# Patient Record
Sex: Female | Born: 1985 | Race: Black or African American | Hispanic: No | Marital: Single | Smoking: Former smoker
Health system: Southern US, Community
[De-identification: ages and names within clinical notes are randomized; demographics above are authoritative.]

## PROBLEM LIST (undated history)

## (undated) ENCOUNTER — Inpatient Hospital Stay (HOSPITAL_COMMUNITY): Payer: Self-pay

## (undated) DIAGNOSIS — F259 Schizoaffective disorder, unspecified: Secondary | ICD-10-CM

## (undated) DIAGNOSIS — F53 Postpartum depression: Secondary | ICD-10-CM

## (undated) DIAGNOSIS — O139 Gestational [pregnancy-induced] hypertension without significant proteinuria, unspecified trimester: Secondary | ICD-10-CM

## (undated) DIAGNOSIS — I10 Essential (primary) hypertension: Secondary | ICD-10-CM

## (undated) DIAGNOSIS — D649 Anemia, unspecified: Secondary | ICD-10-CM

## (undated) HISTORY — DX: Schizoaffective disorder, unspecified: F25.9

## (undated) HISTORY — PX: NO PAST SURGERIES: SHX2092

---

## 2014-06-15 LAB — OB RESULTS CONSOLE RUBELLA ANTIBODY, IGM: Rubella: IMMUNE

## 2014-06-15 LAB — OB RESULTS CONSOLE HEPATITIS B SURFACE ANTIGEN: Hepatitis B Surface Ag: NEGATIVE

## 2014-06-15 LAB — OB RESULTS CONSOLE RPR: RPR: NONREACTIVE

## 2014-06-15 LAB — OB RESULTS CONSOLE VARICELLA ZOSTER ANTIBODY, IGG: VARICELLA IGG: IMMUNE

## 2014-06-15 LAB — OB RESULTS CONSOLE ABO/RH: RH TYPE: POSITIVE

## 2014-06-15 LAB — OB RESULTS CONSOLE GC/CHLAMYDIA
Chlamydia: NEGATIVE
Gonorrhea: NEGATIVE

## 2014-06-15 LAB — OB RESULTS CONSOLE ANTIBODY SCREEN: ANTIBODY SCREEN: NEGATIVE

## 2014-06-15 LAB — OB RESULTS CONSOLE HIV ANTIBODY (ROUTINE TESTING): HIV: NONREACTIVE

## 2014-07-02 LAB — OB RESULTS CONSOLE PLATELET COUNT: Platelets: 206 10*3/uL

## 2014-07-02 LAB — OB RESULTS CONSOLE HGB/HCT, BLOOD
HCT: 35 %
Hemoglobin: 11.7 g/dL

## 2014-09-17 NOTE — L&D Delivery Note (Cosign Needed)
Patient is 29 y.o. G1P0 1620w2d admitted for IOL 2/2 severe preeclampsia on mag x >24h   Delivery Note At 9:16 AM a viable female was delivered via Vaginal, Spontaneous Delivery (Presentation: Right Occiput Anterior).  APGAR: 8, 9; weight  .   Placenta status: Intact, Spontaneous.  Cord: 3 vessels with the following complications: none  Anesthesia: Epidural  Episiotomy: None Lacerations: 1st degree;periurethral Suture Repair: 3.0 vicryl rapide Est. Blood Loss (mL): 720mL  PPH: initially with increased bleeding, large clot evacuated from uterus.  Given 1000mcg cytotec PR.  Still boggy uterus, given hemabate IM.  Bimanual massage x 3 minutes with improved uterine tone.  Cervix inspected and no lacerations.  Subsequent small periurethral tear noted and repaired with figure of eight after foley placed. Mom to AICU for mag x 24h.  Baby to Couplet care / Skin to Skin.  Alyssa Mathis 01/11/2015, 10:37 AM  Addendum: Strongly suspect chorioamnionitis, very strong odor after delivery very characteristic of chorioamnionitis, pediatricians notified as this was an addendum to delivery summary.  Mother afebrile, no fetal tachycardia however mother was tachycardia during entirety of labor.  Discussed with Dr. West Carbohandler  Alyssa Vejar ROCIO, MD 4:02 PM

## 2014-10-25 LAB — GLUCOSE TOLERANCE, 1 HOUR (50G) W/O FASTING: GLUCOSE 1 HOUR GTT: 52 mg/dL (ref ?–200)

## 2014-10-25 LAB — OB RESULTS CONSOLE HGB/HCT, BLOOD: HEMOGLOBIN: 9.5 g/dL

## 2014-10-25 LAB — OB RESULTS CONSOLE GC/CHLAMYDIA
Chlamydia: NEGATIVE
Gonorrhea: NEGATIVE

## 2014-10-25 LAB — OB RESULTS CONSOLE RPR: RPR: NONREACTIVE

## 2014-12-13 ENCOUNTER — Inpatient Hospital Stay (HOSPITAL_COMMUNITY)
Admission: AD | Admit: 2014-12-13 | Discharge: 2014-12-15 | DRG: 781 | Disposition: A | Discharge status: Home / Self Care | Payer: Medicaid Other | Source: Ambulatory Visit | Attending: Obstetrics & Gynecology | Admitting: Obstetrics & Gynecology

## 2014-12-13 ENCOUNTER — Encounter (HOSPITAL_COMMUNITY): Payer: Self-pay | Admitting: *Deleted

## 2014-12-13 DIAGNOSIS — Z008 Encounter for other general examination: Secondary | ICD-10-CM | POA: Insufficient documentation

## 2014-12-13 DIAGNOSIS — O9989 Other specified diseases and conditions complicating pregnancy, childbirth and the puerperium: Secondary | ICD-10-CM | POA: Diagnosis present

## 2014-12-13 DIAGNOSIS — M545 Low back pain: Secondary | ICD-10-CM | POA: Diagnosis present

## 2014-12-13 DIAGNOSIS — Z3A33 33 weeks gestation of pregnancy: Secondary | ICD-10-CM | POA: Diagnosis present

## 2014-12-13 DIAGNOSIS — Y92410 Unspecified street and highway as the place of occurrence of the external cause: Secondary | ICD-10-CM

## 2014-12-13 DIAGNOSIS — O133 Gestational [pregnancy-induced] hypertension without significant proteinuria, third trimester: Secondary | ICD-10-CM | POA: Diagnosis not present

## 2014-12-13 DIAGNOSIS — R1032 Left lower quadrant pain: Secondary | ICD-10-CM | POA: Diagnosis present

## 2014-12-13 DIAGNOSIS — Z3689 Encounter for other specified antenatal screening: Secondary | ICD-10-CM | POA: Insufficient documentation

## 2014-12-13 DIAGNOSIS — O163 Unspecified maternal hypertension, third trimester: Secondary | ICD-10-CM

## 2014-12-13 HISTORY — DX: Anemia, unspecified: D64.9

## 2014-12-13 HISTORY — DX: Essential (primary) hypertension: I10

## 2014-12-13 HISTORY — DX: Gestational (pregnancy-induced) hypertension without significant proteinuria, unspecified trimester: O13.9

## 2014-12-13 LAB — CBC
HCT: 26.7 % — ABNORMAL LOW (ref 36.0–46.0)
HEMOGLOBIN: 8.9 g/dL — AB (ref 12.0–15.0)
MCH: 29 pg (ref 26.0–34.0)
MCHC: 33.3 g/dL (ref 30.0–36.0)
MCV: 87 fL (ref 78.0–100.0)
Platelets: 229 10*3/uL (ref 150–400)
RBC: 3.07 MIL/uL — ABNORMAL LOW (ref 3.87–5.11)
RDW: 13.4 % (ref 11.5–15.5)
WBC: 7.9 10*3/uL (ref 4.0–10.5)

## 2014-12-13 LAB — COMPREHENSIVE METABOLIC PANEL
ALK PHOS: 99 U/L (ref 39–117)
ALT: 24 U/L (ref 0–35)
ANION GAP: 9 (ref 5–15)
AST: 31 U/L (ref 0–37)
Albumin: 3 g/dL — ABNORMAL LOW (ref 3.5–5.2)
BUN: 6 mg/dL (ref 6–23)
CO2: 21 mmol/L (ref 19–32)
Calcium: 8.8 mg/dL (ref 8.4–10.5)
Chloride: 107 mmol/L (ref 96–112)
Creatinine, Ser: 0.6 mg/dL (ref 0.50–1.10)
GFR calc non Af Amer: >90 mL/min (ref >90)
GLUCOSE: 83 mg/dL (ref 70–99)
Potassium: 3.5 mmol/L (ref 3.5–5.1)
Sodium: 137 mmol/L (ref 135–145)
Total Bilirubin: 0.4 mg/dL (ref 0.3–1.2)
Total Protein: 6.9 g/dL (ref 6.0–8.3)

## 2014-12-13 LAB — PROTEIN / CREATININE RATIO, URINE
CREATININE, URINE: 148 mg/dL
PROTEIN CREATININE RATIO: 0.25 — AB (ref 0.00–0.15)
TOTAL PROTEIN, URINE: 37 mg/dL

## 2014-12-13 LAB — OB RESULTS CONSOLE GBS: STREP GROUP B AG: NEGATIVE

## 2014-12-13 MED ORDER — ACETAMINOPHEN 325 MG PO TABS: 650.0000 mg | ORAL_TABLET | ORAL | Status: DC | PRN

## 2014-12-13 MED ORDER — DOCUSATE SODIUM 100 MG PO CAPS
100.0000 mg | ORAL_CAPSULE | Freq: Every day | ORAL | Status: DC
  Administered 2014-12-14 – 2014-12-15 (×2): 100 mg via ORAL
  Filled 2014-12-13 (×2): qty 1

## 2014-12-13 MED ORDER — PRENATAL MULTIVITAMIN CH
1.0000 | ORAL_TABLET | Freq: Every day | ORAL | Status: DC
  Administered 2014-12-14 – 2014-12-15 (×2): 1 via ORAL
  Filled 2014-12-13 (×2): qty 1

## 2014-12-13 MED ORDER — BETAMETHASONE SOD PHOS & ACET 6 (3-3) MG/ML IJ SUSP
12.0000 mg | INTRAMUSCULAR | Status: AC
  Administered 2014-12-14 – 2014-12-15 (×2): 12 mg via INTRAMUSCULAR
  Filled 2014-12-13 (×2): qty 2

## 2014-12-13 MED ORDER — CALCIUM CARBONATE ANTACID 500 MG PO CHEW: 2.0000 | CHEWABLE_TABLET | ORAL | Status: DC | PRN

## 2014-12-13 MED ORDER — ZOLPIDEM TARTRATE 5 MG PO TABS: 5.0000 mg | ORAL_TABLET | Freq: Every evening | ORAL | Status: DC | PRN
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
## 2014-12-13 NOTE — MAU Provider Note (Signed)
O: BP's continue elevated. Labs pending, FHR pattern reassurring A: preeclampsia P: Dr. Debroah LoopArnold to evaluate
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
## 2014-12-14 ENCOUNTER — Inpatient Hospital Stay (HOSPITAL_COMMUNITY): Payer: Medicaid Other

## 2014-12-14 DIAGNOSIS — Z008 Encounter for other general examination: Secondary | ICD-10-CM | POA: Insufficient documentation

## 2014-12-14 DIAGNOSIS — Z3689 Encounter for other specified antenatal screening: Secondary | ICD-10-CM | POA: Insufficient documentation

## 2014-12-14 DIAGNOSIS — Z3A33 33 weeks gestation of pregnancy: Secondary | ICD-10-CM | POA: Insufficient documentation

## 2014-12-14 LAB — URINE MICROSCOPIC-ADD ON

## 2014-12-14 LAB — URINALYSIS, ROUTINE W REFLEX MICROSCOPIC
Bilirubin Urine: NEGATIVE
GLUCOSE, UA: NEGATIVE mg/dL
HGB URINE DIPSTICK: NEGATIVE
Ketones, ur: 40 mg/dL — AB
NITRITE: NEGATIVE
Protein, ur: NEGATIVE mg/dL
Specific Gravity, Urine: 1.02 (ref 1.005–1.030)
Urobilinogen, UA: 1 mg/dL (ref 0.0–1.0)
pH: 5.5 (ref 5.0–8.0)

## 2014-12-14 NOTE — Plan of Care (Signed)
Pt. Refuses the betamethazone at this time.

## 2014-12-14 NOTE — Progress Notes (Signed)
FACULTY PRACTICE ANTEPARTUM(COMPREHENSIVE) NOTE  Alyssa Mathis is a 29 y.o. G1P0 at [redacted]w[redacted]d by early ultrasound who is admitted for preeclampsia.   Fetal presentation is unsure. Length of Stay:  1  Days  Subjective: Won't take betamethasone yet Patient reports the fetal movement as active. Patient reports uterine contraction  activity as none. Patient reports  vaginal bleeding as none. Patient describes fluid per vagina as None.  Vitals:  Blood pressure 153/81, pulse 103, temperature 98.3 F (36.8 C), temperature source Oral, resp. rate 20, height  (1.626 m), weight 71.668 kg (158 lb), SpO2 98 %. Physical Examination:  General appearance - alert, well appearing, and in no distress Heart - normal rate and regular rhythm Abdomen - soft, nontender, nondistended Fundal Height:  size equals dates Cervical Exam: Not evaluated.  Extremities: extremities normal, atraumatic, no cyanosis or edema and Homans sign is negative, no sign of DVT with DTRs 2+ bilaterally Membranes:intact  Fetal Monitoring:    Fetal Heart Rate A     Mode  External filed at 12/14/2014 0416    Baseline Rate (A)  146 bpm filed at 12/14/2014 0416    Variability  6-25 BPM filed at 12/14/2014 0052    Accelerations  15 x 15 filed at 12/14/2014 0052    Decelerations  None filed at 12/14/2014 0052        Labs:  Results for orders placed or performed during the hospital encounter of 12/13/14 (from the past 24 hour(s))  CBC   Collection Time: 12/13/14  5:01 PM  Result Value Ref Range   WBC 7.9 4.0 - 10.5 K/uL   RBC 3.07 (L) 3.87 - 5.11 MIL/uL   Hemoglobin 8.9 (L) 12.0 - 15.0 g/dL   HCT 16.1 (L) 09.6 - 04.5 %   MCV 87.0 78.0 - 100.0 fL   MCH 29.0 26.0 - 34.0 pg   MCHC 33.3 30.0 - 36.0 g/dL   RDW 40.9 81.1 - 91.4 %   Platelets 229 150 - 400 K/uL  Comprehensive metabolic panel   Collection Time: 12/13/14  5:01 PM  Result Value Ref Range   Sodium 137 135 - 145 mmol/L   Potassium 3.5 3.5 - 5.1 mmol/L    Chloride 107 96 - 112 mmol/L   CO2 21 19 - 32 mmol/L   Glucose, Bld 83 70 - 99 mg/dL   BUN 6 6 - 23 mg/dL   Creatinine, Ser 7.82 0.50 - 1.10 mg/dL   Calcium 8.8 8.4 - 95.6 mg/dL   Total Protein 6.9 6.0 - 8.3 g/dL   Albumin 3.0 (L) 3.5 - 5.2 g/dL   AST 31 0 - 37 U/L   ALT 24 0 - 35 U/L   Alkaline Phosphatase 99 39 - 117 U/L   Total Bilirubin 0.4 0.3 - 1.2 mg/dL   GFR calc non Af Amer >90 >90 mL/min   GFR calc Af Amer >90 >90 mL/min   Anion gap 9 5 - 15  Protein / creatinine ratio, urine   Collection Time: 12/13/14  5:01 PM  Result Value Ref Range   Creatinine, Urine 148.00 mg/dL   Total Protein, Urine 37 mg/dL   Protein Creatinine Ratio 0.25 (H) 0.00 - 0.15  Urinalysis, Routine w reflex microscopic   Collection Time: 12/14/14 12:20 AM  Result Value Ref Range   Color, Urine YELLOW YELLOW   APPearance HAZY (A) CLEAR   Specific Gravity, Urine 1.020 1.005 - 1.030   pH 5.5 5.0 - 8.0   Glucose, UA NEGATIVE NEGATIVE  mg/dL   Hgb urine dipstick NEGATIVE NEGATIVE   Bilirubin Urine NEGATIVE NEGATIVE   Ketones, ur 40 (A) NEGATIVE mg/dL   Protein, ur NEGATIVE NEGATIVE mg/dL   Urobilinogen, UA 1.0 0.0 - 1.0 mg/dL   Nitrite NEGATIVE NEGATIVE   Leukocytes, UA TRACE (A) NEGATIVE  Urine microscopic-add on   Collection Time: 12/14/14 12:20 AM  Result Value Ref Range   Squamous Epithelial / LPF FEW (A) RARE   WBC, UA 0-2 <3 WBC/hpf   RBC / HPF 0-2 <3 RBC/hpf   Bacteria, UA RARE RARE    Imaging Studies:     Currently EPIC will not allow sonographic studies to automatically populate into notes.  In the meantime, copy and paste results into note or free text.  Medications:  Scheduled . betamethasone acetate-betamethasone sodium phosphate  12 mg Intramuscular Q24H  . docusate sodium  100 mg Oral Daily  . prenatal multivitamin  1 tablet Oral Q1200   I have reviewed the patient's current medications.  ASSESSMENT: Patient Active Problem List   Diagnosis Date Noted  . Gestational  hypertension without significant proteinuria in third trimester 12/13/2014    PLAN: 24 hr urine and follow BP, needs BMX  Ronnika Collett 12/14/2014,7:32 AM

## 2014-12-14 NOTE — Plan of Care (Signed)
Pt. Refuses to take the betamethazone for fetal lung maturity, explained to pt. The importance of the medication and pt. Con't. To refuse.

## 2014-12-14 NOTE — Plan of Care (Signed)
Dr. Debroah LoopArnold up to see pt. And explain the importance of betamethazone and pt. Con't. To refuse at this time.

## 2014-12-14 NOTE — Plan of Care (Signed)
Problem: Consults Goal: Birthing Suites Patient Information Press F2 to bring up selections list  Outcome: Not Applicable Date Met:  91/50/56  Pt < [redacted] weeks EGA  Problem: Phase II Progression Outcomes Goal: Electronic fetal monitoring(Doppler,Continuous,Intermittent) EFM (Doppler, Continuous, Intermittent)  Outcome: Progressing QS for 30 minutes. Goal: Mattress in use Mattress in use Geneticist, molecular, Med/Surg, Regular, Birthing Suite, Other)  Outcome: Completed/Met Date Met:  12/14/14 Reg.

## 2014-12-14 NOTE — Plan of Care (Signed)
Pt. Called out to receive the betamethazone and as I stood in the room willing to adm. Pt. Continued to refuse.

## 2014-12-15 DIAGNOSIS — O133 Gestational [pregnancy-induced] hypertension without significant proteinuria, third trimester: Principal | ICD-10-CM

## 2014-12-15 DIAGNOSIS — Z3A33 33 weeks gestation of pregnancy: Secondary | ICD-10-CM

## 2014-12-15 LAB — CREATININE CLEARANCE, URINE, 24 HOUR
CREAT CLEAR: 165 mL/min — AB (ref 75–115)
Collection Interval-CRCL: 24 hours
Creatinine, 24H Ur: 1423 mg/d (ref 700–1800)
Creatinine, Urine: 79.05 mg/dL
Urine Total Volume-CRCL: 1800 mL

## 2014-12-15 NOTE — Discharge Instructions (Signed)
Hypertension During Pregnancy Hypertension is also called high blood pressure. Blood pressure moves blood in your body. Sometimes, the force that moves the blood becomes too strong. When you are pregnant, this condition should be watched carefully. It can cause problems for you and your baby. HOME CARE   Make and keep all of your doctor visits.  Take medicine as told by your doctor. Tell your doctor about all medicines you take.  Eat very little salt.  Exercise regularly.  Do not drink alcohol.  Do not smoke.  Do not have drinks with caffeine.  Lie on your left side when resting.  Your health care provider may ask you to take one low-dose aspirin (81mg ) each day. GET HELP RIGHT AWAY IF: 1. You have bad belly (abdominal) pain. 2. You have sudden puffiness (swelling) in the hands, ankles, or face. 3. You gain 4 pounds (1.8 kilograms) or more in 1 week. 4. You throw up (vomit) repeatedly. 5. You have bleeding from the vagina. 6. You do not feel the baby moving as much. 7. You have a headache. 8. You have blurred or double vision. 9. You have muscle twitching or spasms. 10. You have shortness of breath. 11. You have blue fingernails and lips. 12. You have blood in your pee (urine). MAKE SURE YOU:  Understand these instructions.  Will watch your condition.  Will get help right away if you are not doing well or get worse. Document Released: 10/06/2010 Document Revised: 01/18/2014 Document Reviewed: 04/02/2013 [facility] Patient Information 2015 [employer], [place]. This information is not intended to replace advice given to you by your health care provider. Make sure you discuss any questions you have with your health care provider. Fetal Movement Counts Patient Name: __________________________________________________ Patient Due Date: ____________________ Performing a fetal movement count is highly recommended in high-risk pregnancies, but it is good for every pregnant woman to do.  Your health care provider may ask you to start counting fetal movements at 28 weeks of the pregnancy. Fetal movements often increase:  After eating a full meal.  After physical activity.  After eating or drinking something sweet or cold.  At rest. Pay attention to when you feel the baby is most active. This will help you notice a pattern of your baby's sleep and wake cycles and what factors contribute to an increase in fetal movement. It is important to perform a fetal movement count at the same time each day when your baby is normally most active.  HOW TO COUNT FETAL MOVEMENTS 13. Find a quiet and comfortable area to sit or lie down on your left side. Lying on your left side provides the best blood and oxygen circulation to your baby. 14. Write down the day and time on a sheet of paper or in a journal. 15. Start counting kicks, flutters, swishes, rolls, or jabs in a 2-hour period. You should feel at least 10 movements within 2 hours. 16. If you do not feel 10 movements in 2 hours, wait 2-3 hours and count again. Look for a change in the pattern or not enough counts in 2 hours. SEEK MEDICAL CARE IF:  You feel less than 10 counts in 2 hours, tried twice.  There is no movement in over an hour.  The pattern is changing or taking longer each day to reach 10 counts in 2 hours.  You feel the baby is not moving as he or she usually does. Date: ____________ Movements: ____________ Start time: ____________ Doreatha MartinFinish time: ____________  Date:  ____________ Movements: ____________ Start time: ____________ Doreatha MartinFinish time: ____________ Date: ____________ Movements: ____________ Start time: ____________ Doreatha MartinFinish time: ____________ Date: ____________ Movements: ____________ Start time: ____________ Doreatha MartinFinish time: ____________ Date: ____________ Movements: ____________ Start time: ____________ Doreatha MartinFinish time: ____________ Date: ____________ Movements: ____________ Start time: ____________ Doreatha MartinFinish time:  ____________ Date: ____________ Movements: ____________ Start time: ____________ Doreatha MartinFinish time: ____________ Date: ____________ Movements: ____________ Start time: ____________ Doreatha MartinFinish time: ____________  Date: ____________ Movements: ____________ Start time: ____________ Doreatha MartinFinish time: ____________ Date: ____________ Movements: ____________ Start time: ____________ Doreatha MartinFinish time: ____________ Date: ____________ Movements: ____________ Start time: ____________ Doreatha MartinFinish time: ____________ Date: ____________ Movements: ____________ Start time: ____________ Doreatha MartinFinish time: ____________ Date: ____________ Movements: ____________ Start time: ____________ Doreatha MartinFinish time: ____________ Date: ____________ Movements: ____________ Start time: ____________ Doreatha MartinFinish time: ____________ Date: ____________ Movements: ____________ Start time: ____________ Doreatha MartinFinish time: ____________  Date: ____________ Movements: ____________ Start time: ____________ Doreatha MartinFinish time: ____________ Date: ____________ Movements: ____________ Start time: ____________ Doreatha MartinFinish time: ____________ Date: ____________ Movements: ____________ Start time: ____________ Doreatha MartinFinish time: ____________ Date: ____________ Movements: ____________ Start time: ____________ Doreatha MartinFinish time: ____________ Date: ____________ Movements: ____________ Start time: ____________ Doreatha MartinFinish time: ____________ Date: ____________ Movements: ____________ Start time: ____________ Doreatha MartinFinish time: ____________ Date: ____________ Movements: ____________ Start time: ____________ Doreatha MartinFinish time: ____________  Date: ____________ Movements: ____________ Start time: ____________ Doreatha MartinFinish time: ____________ Date: ____________ Movements: ____________ Start time: ____________ Doreatha MartinFinish time: ____________ Date: ____________ Movements: ____________ Start time: ____________ Doreatha MartinFinish time: ____________ Date: ____________ Movements: ____________ Start time: ____________ Doreatha MartinFinish time: ____________ Date: ____________ Movements:  ____________ Start time: ____________ Doreatha MartinFinish time: ____________ Date: ____________ Movements: ____________ Start time: ____________ Doreatha MartinFinish time: ____________ Date: ____________ Movements: ____________ Start time: ____________ Doreatha MartinFinish time: ____________  Date: ____________ Movements: ____________ Start time: ____________ Doreatha MartinFinish time: ____________ Date: ____________ Movements: ____________ Start time: ____________ Doreatha MartinFinish time: ____________ Date: ____________ Movements: ____________ Start time: ____________ Doreatha MartinFinish time: ____________ Date: ____________ Movements: ____________ Start time: ____________ Doreatha MartinFinish time: ____________ Date: ____________ Movements: ____________ Start time: ____________ Doreatha MartinFinish time: ____________ Date: ____________ Movements: ____________ Start time: ____________ Doreatha MartinFinish time: ____________ Date: ____________ Movements: ____________ Start time: ____________ Doreatha MartinFinish time: ____________  Date: ____________ Movements: ____________ Start time: ____________ Doreatha MartinFinish time: ____________ Date: ____________ Movements: ____________ Start time: ____________ Doreatha MartinFinish time: ____________ Date: ____________ Movements: ____________ Start time: ____________ Doreatha MartinFinish time: ____________ Date: ____________ Movements: ____________ Start time: ____________ Doreatha MartinFinish time: ____________ Date: ____________ Movements: ____________ Start time: ____________ Doreatha MartinFinish time: ____________ Date: ____________ Movements: ____________ Start time: ____________ Doreatha MartinFinish time: ____________ Date: ____________ Movements: ____________ Start time: ____________ Doreatha MartinFinish time: ____________  Date: ____________ Movements: ____________ Start time: ____________ Doreatha MartinFinish time: ____________ Date: ____________ Movements: ____________ Start time: ____________ Doreatha MartinFinish time: ____________ Date: ____________ Movements: ____________ Start time: ____________ Doreatha MartinFinish time: ____________ Date: ____________ Movements: ____________ Start time: ____________ Doreatha MartinFinish  time: ____________ Date: ____________ Movements: ____________ Start time: ____________ Doreatha MartinFinish time: ____________ Date: ____________ Movements: ____________ Start time: ____________ Doreatha MartinFinish time: ____________ Date: ____________ Movements: ____________ Start time: ____________ Doreatha MartinFinish time: ____________  Date: ____________ Movements: ____________ Start time: ____________ Doreatha MartinFinish time: ____________ Date: ____________ Movements: ____________ Start time: ____________ Doreatha MartinFinish time: ____________ Date: ____________ Movements: ____________ Start time: ____________ Doreatha MartinFinish time: ____________ Date: ____________ Movements: ____________ Start time: ____________ Doreatha MartinFinish time: ____________ Date: ____________ Movements: ____________ Start time: ____________ Doreatha MartinFinish time: ____________ Date: ____________ Movements: ____________ Start time: ____________ Doreatha MartinFinish time: ____________ Document Released: 10/03/2006 Document Revised: 01/18/2014 Document Reviewed: 06/30/2012 Exit[employer] Patient Information 2015 [employer]. This information is not intended to replace advice given to you by your health care provider. Make sure you discuss any questions you have with your  health care provider. ° °

## 2014-12-15 NOTE — Progress Notes (Signed)
Dr. Adrian BlackwaterStinson states to discharge patient after 11:00 am Betamethasone dose.
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
## 2014-12-18 LAB — CULTURE, BETA STREP (GROUP B ONLY)

## 2014-12-29 ENCOUNTER — Inpatient Hospital Stay (HOSPITAL_COMMUNITY)
Admission: AD | Admit: 2014-12-29 | Discharge: 2014-12-30 | Disposition: A | Discharge status: Home / Self Care | Payer: Medicaid Other | Source: Ambulatory Visit | Attending: Obstetrics and Gynecology | Admitting: Obstetrics and Gynecology

## 2014-12-29 ENCOUNTER — Inpatient Hospital Stay (HOSPITAL_COMMUNITY)
Admission: AD | Admit: 2014-12-29 | Discharge: 2014-12-29 | Disposition: A | Discharge status: Home / Self Care | Payer: Medicaid Other | Source: Ambulatory Visit | Attending: Family Medicine | Admitting: Family Medicine

## 2014-12-29 ENCOUNTER — Encounter (HOSPITAL_COMMUNITY): Payer: Self-pay | Admitting: *Deleted

## 2014-12-29 DIAGNOSIS — Z3A35 35 weeks gestation of pregnancy: Secondary | ICD-10-CM | POA: Insufficient documentation

## 2014-12-29 DIAGNOSIS — O133 Gestational [pregnancy-induced] hypertension without significant proteinuria, third trimester: Secondary | ICD-10-CM | POA: Diagnosis not present

## 2014-12-29 LAB — COMPREHENSIVE METABOLIC PANEL
ALK PHOS: 121 U/L — AB (ref 39–117)
ALT: 18 U/L (ref 0–35)
ANION GAP: 7 (ref 5–15)
AST: 22 U/L (ref 0–37)
Albumin: 3 g/dL — ABNORMAL LOW (ref 3.5–5.2)
BUN: 9 mg/dL (ref 6–23)
CO2: 23 mmol/L (ref 19–32)
Calcium: 9.1 mg/dL (ref 8.4–10.5)
Chloride: 107 mmol/L (ref 96–112)
Creatinine, Ser: 0.7 mg/dL (ref 0.50–1.10)
GFR calc non Af Amer: >90 mL/min (ref >90)
Glucose, Bld: 92 mg/dL (ref 70–99)
POTASSIUM: 3.9 mmol/L (ref 3.5–5.1)
SODIUM: 137 mmol/L (ref 135–145)
TOTAL PROTEIN: 6.9 g/dL (ref 6.0–8.3)
Total Bilirubin: 0.5 mg/dL (ref 0.3–1.2)

## 2014-12-29 LAB — PROTEIN / CREATININE RATIO, URINE
Creatinine, Urine: 124 mg/dL
Protein Creatinine Ratio: 0.2 — ABNORMAL HIGH (ref 0.00–0.15)
Total Protein, Urine: 25 mg/dL

## 2014-12-29 LAB — CBC
HEMATOCRIT: 28.9 % — AB (ref 36.0–46.0)
Hemoglobin: 9.3 g/dL — ABNORMAL LOW (ref 12.0–15.0)
MCH: 28.1 pg (ref 26.0–34.0)
MCHC: 32.2 g/dL (ref 30.0–36.0)
MCV: 87.3 fL (ref 78.0–100.0)
Platelets: 236 10*3/uL (ref 150–400)
RBC: 3.31 MIL/uL — ABNORMAL LOW (ref 3.87–5.11)
RDW: 14.2 % (ref 11.5–15.5)
WBC: 7.9 10*3/uL (ref 4.0–10.5)

## 2014-12-29 LAB — URIC ACID: Uric Acid, Serum: 5.2 mg/dL (ref 2.4–7.0)

## 2014-12-29 LAB — LACTATE DEHYDROGENASE: LDH: 178 U/L (ref 94–250)

## 2014-12-29 MED ORDER — FERROUS SULFATE 325 (65 FE) MG PO TABS: 325.0000 mg | ORAL_TABLET | Freq: Two times a day (BID) | ORAL | Status: DC
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
## 2014-12-29 NOTE — MAU Note (Signed)
Pt states that she began to feel lower abdominal pressure this evening into tonight which was consistent. Pt felt like she was contracting. Pt denies leaking of fluid and bleeding and states that BP has been increasing over past couple of weeks and that she was suppose to have a 24 hour urine tomorrow.

## 2014-12-29 NOTE — MAU Note (Signed)
Pt reports she has been having elevated B/P's, was inpatient here and then in MAU yesterday. States she has had "sparkles" in her field of vision for a while. States they have never given her anything for her B/P.

## 2014-12-29 NOTE — Discharge Instructions (Signed)

## 2014-12-30 DIAGNOSIS — R030 Elevated blood-pressure reading, without diagnosis of hypertension: Secondary | ICD-10-CM | POA: Diagnosis present

## 2014-12-30 DIAGNOSIS — Z3A35 35 weeks gestation of pregnancy: Secondary | ICD-10-CM

## 2014-12-30 DIAGNOSIS — O133 Gestational [pregnancy-induced] hypertension without significant proteinuria, third trimester: Secondary | ICD-10-CM | POA: Diagnosis not present

## 2014-12-30 LAB — CREATININE CLEARANCE, URINE, 24 HOUR
COLLECTION INTERVAL-CRCL: 24 h
CREATININE 24H UR: 1597 mg/d (ref 700–1800)
CREATININE, URINE: 152.08 mg/dL
Creatinine Clearance: 158 mL/min — ABNORMAL HIGH (ref 75–115)
URINE TOTAL VOLUME-CRCL: 1050 mL

## 2014-12-30 LAB — PROTEIN, URINE, 24 HOUR
COLLECTION INTERVAL-UPROT: 24 h
Protein, 24H Urine: 305 mg/d — ABNORMAL HIGH (ref <150)
Protein, Urine: 29 mg/dL — ABNORMAL HIGH (ref 5–24)
URINE TOTAL VOLUME-UPROT: 1050 mL

## 2014-12-30 NOTE — MAU Provider Note (Signed)
  History     CSN: [id number]  Arrival date and time: 12/29/14 2155   None     Chief Complaint  Patient presents with  . Hypertension    Pt states she sees' "sparkles"    HPI [name] is a 29yo G1 @ 35.4wks who presents for further eval of BP. She was seen in the early AM 4/13 in MAU and was d/c home with a 24 hr urine to be collected. She was talking to a friend on 4/13 who mentioned that she had a dx of preeclampsia via seeing 'sparkles' and the pt recalled that she has been seeing 'sparkles' for months now and became concerned.  Denies H/A or RUQ pain; no swelling, ctx, leaking or bldg. She [facility] the [facility]. Nl preeclampsia labs approx 19hrs ago other than anemia w/ Hgb 9.3.  OB History    Gravida Para Term Preterm AB TAB SAB Ectopic Multiple Living   1               Past Medical History  Diagnosis Date  . Medical history non-contributory   . Anemia   . Hypertension   . Pregnancy induced hypertension     Past Surgical History  Procedure Laterality Date  . No past surgeries      History reviewed. No pertinent family history.  History  Substance Use Topics  . Smoking status: Never Smoker   . Smokeless tobacco: Not on file  . Alcohol Use: No    Allergies: No Known Allergies  Prescriptions prior to admission  Medication Sig Dispense Refill Last Dose  . Prenatal Vit-Fe Fumarate-FA (PRENATAL MULTIVITAMIN) TABS tablet Take 1 tablet by mouth daily at 12 noon.   12/29/2014 at Unknown time  . ferrous sulfate 325 (65 FE) MG tablet Take 1 tablet (325 mg total) by mouth 2 (two) times daily with a meal. (Patient not taking: Reported on 12/29/2014) 60 tablet 3 Not Taking at Unknown time    ROS Physical Exam   Blood pressure 143/92, pulse 97, temperature 99.3 F (37.4 C), temperature source Oral, resp. rate 18, height 5\' 3"  (1.6 m), weight 71.215 kg (157 lb), SpO2 100 %. BPs 139/89, 143/97  Physical Exam  Constitutional: She is  oriented to person, place, and time. She appears well-developed.  HENT:  Head: Normocephalic.  Cardiovascular: Normal rate.   Respiratory: Effort normal.  GI:  FHR 140s, +accels, no decels Rare ctx per toco  Musculoskeletal: Normal range of motion. She exhibits no edema.  Neurological: She is alert and oriented to person, place, and time.  Skin: Skin is warm and dry.  Psychiatric: She has a normal mood and affect. Her behavior is normal. Thought content normal.    MAU Course  Procedures    Assessment and Plan  IUP@35 .4wks Gest HTN  D/C home with preeclampsia s/s Has clinic appt on 4/25 but will need antenatal testing to start now (msg sent) Will bring 24 hr urine back to the lab in the AM  Isha Seefeld CNM 12/30/2014, 12:29 AM

## 2014-12-30 NOTE — Discharge Instructions (Signed)

## 2015-01-03 ENCOUNTER — Ambulatory Visit (INDEPENDENT_AMBULATORY_CARE_PROVIDER_SITE_OTHER): Payer: Medicaid Other | Admitting: Obstetrics & Gynecology

## 2015-01-03 VITALS — BP 150/94 | HR 98 | Wt 159.6 lb

## 2015-01-03 DIAGNOSIS — O1493 Unspecified pre-eclampsia, third trimester: Secondary | ICD-10-CM | POA: Diagnosis not present

## 2015-01-03 DIAGNOSIS — O0933 Supervision of pregnancy with insufficient antenatal care, third trimester: Secondary | ICD-10-CM

## 2015-01-03 DIAGNOSIS — O163 Unspecified maternal hypertension, third trimester: Secondary | ICD-10-CM

## 2015-01-03 LAB — POCT URINALYSIS DIP (DEVICE)
Glucose, UA: NEGATIVE mg/dL
Hgb urine dipstick: NEGATIVE
Nitrite: NEGATIVE
Protein, ur: 100 mg/dL — AB
Specific Gravity, Urine: >=1.03 (ref 1.005–1.030)
Urobilinogen, UA: 2 mg/dL — ABNORMAL HIGH (ref 0.0–1.0)
pH: 6 (ref 5.0–8.0)

## 2015-01-03 NOTE — Progress Notes (Signed)
[facility], has preeclampsia without severe features, 24 hr urine 305  NST reactive now. No headache or swelling  [facility]ive:[facility] [place]    Alyssa Kizzie BaneHughes is a G1P0 2826w1d being seen today for her first obstetrical visit.  Her obstetrical history is significant for pre-eclampsia. Patient does intend to breast feed. Pregnancy history fully reviewed.  Patient reports no complaints.  Filed Vitals:   01/03/15 1412 01/03/15 1416 01/03/15 1503  BP: 151/94 148/92 150/94  Pulse: 98    Weight: 159 lb 9.6 oz (72.394 kg)      HISTORY: OB History  Gravida Para Term Preterm AB SAB TAB Ectopic Multiple Living  1             # Outcome Date GA Lbr Len/2nd Weight Sex Delivery Anes PTL Lv  1 Current              Past Medical History  Diagnosis Date  . Medical history non-contributory   . Anemia   . Hypertension   . Pregnancy induced hypertension    Past Surgical History  Procedure Laterality Date  . No past surgeries     History reviewed. No pertinent family history.   Exam    Uterus:     Pelvic Exam:    Perineum: No Hemorrhoids   Vulva: normal   Vagina:  normal mucosa   pH:     Cervix: no lesions   Adnexa: not evaluated   Bony Pelvis: average  System: Breast:      Skin: normal coloration and turgor, no rashes    Neurologic: oriented, normal mood   Extremities: normal strength, tone, and muscle mass   HEENT neck supple with midline trachea   Mouth/Teeth dental hygiene good   Neck supple   Cardiovascular: regular rate and rhythm   Respiratory:  appears well, vitals normal, no respiratory distress, acyanotic, normal RR   Abdomen: soft, non-tender; bowel sounds normal; no masses,  no organomegaly   Urinary: urethral meatus normal      Assessment:    Pregnancy: G1P0 Patient Active Problem List   Diagnosis Date Noted  . Hypertension in pregnancy, preeclampsia 12/30/2014  . Hypertension affecting pregnancy, antepartum 12/14/2014  . Encounter for  fetal anatomic survey         Plan:     Initial labs drawn. Prenatal vitamins. Problem list reviewed and updated. Genetic Screening not done  Ultrasound discussed; fetal survey: nl per pt.  Follow up in 3 days NST 50% of 30 min visit spent on counseling and coordination of care.  IOL 37 weeks for preeclampsia   Jonny Longino 01/03/2015

## 2015-01-03 NOTE — Progress Notes (Signed)
Pt states [facility] because she desires a [facility]. I advised pt this may not be possible because of her high risk pregnancy due to elevated blood pressure. Pt is not yet aware of Dx of pre-eclampsia. Also, US from 3/29 showed oligo. Pt states that she [place] ago and was told the amniotic fluid was normal. She also states that when she becomes calm, her BP is normal. Pt slept during NST, then BP 150/94.  **IOL scheduled 4/24 @ 1930 - pt needs to be informed.

## 2015-01-03 NOTE — Progress Notes (Signed)
Pt reports that she had [facility], normal per her report.

## 2015-01-03 NOTE — Patient Instructions (Signed)
Labor Induction  Labor induction is when steps are taken to cause a pregnant woman to begin the labor process. Most women go into labor on their own between 37 weeks and 42 weeks of the pregnancy. When this does not happen or when there is a medical need, methods may be used to induce labor. Labor induction causes a pregnant woman's uterus to contract. It also causes the cervix to soften (ripen), open (dilate), and thin out (efface). Usually, labor is not induced before 39 weeks of the pregnancy unless there is a problem with the baby or mother.  Before inducing labor, your health care provider will consider a number of factors, including the following:  The medical condition of you and the baby.   How many weeks along you are.   The status of the baby's lung maturity.   The condition of the cervix.   The position of the baby.  WHAT ARE THE REASONS FOR LABOR INDUCTION? Labor may be induced for the following reasons:  The health of the baby or mother is at risk.   The pregnancy is overdue by 1 week or more.   The water breaks but labor does not start on its own.   The mother has a health condition or serious illness, such as high blood pressure, infection, placental abruption, or diabetes.  The amniotic fluid amounts are low around the baby.   The baby is distressed.  Convenience or wanting the baby to be born on a certain date is not a reason for inducing labor. WHAT METHODS ARE USED FOR LABOR INDUCTION? Several methods of labor induction may be used, such as:   Prostaglandin medicine. This medicine causes the cervix to dilate and ripen. The medicine will also start contractions. It can be taken by mouth or by inserting a suppository into the vagina.   Inserting a thin tube (catheter) with a balloon on the end into the vagina to dilate the cervix. Once inserted, the balloon is expanded with water, which causes the cervix to open.   Stripping the membranes. Your health  care provider separates amniotic sac tissue from the cervix, causing the cervix to be stretched and causing the release of a hormone called progesterone. This may cause the uterus to contract. It is often done during an office visit. You will be sent home to wait for the contractions to begin. You will then come in for an induction.   Breaking the water. Your health care provider makes a hole in the amniotic sac using a small instrument. Once the amniotic sac breaks, contractions should begin. This may still take hours to see an effect.   Medicine to trigger or strengthen contractions. This medicine is given through an IV access tube inserted into a vein in your arm.  All of the methods of induction, besides stripping the membranes, will be done in the hospital. Induction is done in the hospital so that you and the baby can be carefully monitored.  HOW LONG DOES IT TAKE FOR LABOR TO BE INDUCED? Some inductions can take up to 2-3 days. Depending on the cervix, it usually takes less time. It takes longer when you are induced early in the pregnancy or if this is your first pregnancy. If a mother is still pregnant and the induction has been going on for 2-3 days, either the mother will be sent home or a cesarean delivery will be needed. WHAT ARE THE RISKS ASSOCIATED WITH LABOR INDUCTION? Some of the risks of induction   include:   Changes in fetal heart rate, such as too high, too low, or erratic.   Fetal distress.   Chance of infection for the mother and baby.   Increased chance of having a cesarean delivery.   Breaking off (abruption) of the placenta from the uterus (rare).   Uterine rupture (very rare).  When induction is needed for medical reasons, the benefits of induction may outweigh the risks. WHAT ARE SOME REASONS FOR NOT INDUCING LABOR? Labor induction should not be done if:   It is shown that your baby does not tolerate labor.   You have had previous surgeries on your  uterus, such as a myomectomy or the removal of fibroids.   Your placenta lies very low in the uterus and blocks the opening of the cervix (placenta previa).   Your baby is not in a head-down position.   The umbilical cord drops down into the birth canal in front of the baby. This could cut off the baby's blood and oxygen supply.   You have had a previous cesarean delivery.   There are unusual circumstances, such as the baby being extremely premature.  Document Released: 01/23/2007 Document Revised: 05/06/2013 Document Reviewed: 04/02/2013 Exit[employer] Patient Information 2015 [employer]. This information is not intended to replace advice given to you by your health care provider. Make sure you discuss any questions you have with your health care provider.  

## 2015-01-04 ENCOUNTER — Telehealth (HOSPITAL_COMMUNITY): Payer: Self-pay | Admitting: *Deleted

## 2015-01-04 ENCOUNTER — Encounter (HOSPITAL_COMMUNITY): Payer: Self-pay | Admitting: *Deleted

## 2015-01-04 LAB — GC/CHLAMYDIA PROBE AMP
CT Probe RNA: NEGATIVE
GC Probe RNA: NEGATIVE

## 2015-01-04 NOTE — Telephone Encounter (Signed)
Preadmission screen  

## 2015-01-05 LAB — CULTURE, OB URINE
Colony Count: NO GROWTH
Organism ID, Bacteria: NO GROWTH

## 2015-01-06 ENCOUNTER — Ambulatory Visit (INDEPENDENT_AMBULATORY_CARE_PROVIDER_SITE_OTHER): Payer: Medicaid Other | Admitting: *Deleted

## 2015-01-06 VITALS — BP 146/95 | HR 104

## 2015-01-06 DIAGNOSIS — O1493 Unspecified pre-eclampsia, third trimester: Secondary | ICD-10-CM | POA: Diagnosis present

## 2015-01-06 NOTE — Progress Notes (Signed)
Pt denies H/A or visual disturbances.  IOL scheduled 4/24.

## 2015-01-06 NOTE — Progress Notes (Signed)
NST reviewed and reactive.  

## 2015-01-09 ENCOUNTER — Inpatient Hospital Stay (HOSPITAL_COMMUNITY)
Admission: RE | Admit: 2015-01-09 | Discharge: 2015-01-13 | DRG: 774 | Disposition: A | Discharge status: Home / Self Care | Payer: Medicaid Other | Source: Ambulatory Visit | Attending: Obstetrics and Gynecology | Admitting: Obstetrics and Gynecology

## 2015-01-09 ENCOUNTER — Encounter (HOSPITAL_COMMUNITY): Payer: Self-pay

## 2015-01-09 VITALS — BP 135/71 | HR 104 | Temp 99.6°F | Resp 18 | Ht 63.5 in | Wt 154.0 lb

## 2015-01-09 DIAGNOSIS — O1413 Severe pre-eclampsia, third trimester: Secondary | ICD-10-CM | POA: Diagnosis present

## 2015-01-09 DIAGNOSIS — O9902 Anemia complicating childbirth: Secondary | ICD-10-CM | POA: Diagnosis present

## 2015-01-09 DIAGNOSIS — O1493 Unspecified pre-eclampsia, third trimester: Secondary | ICD-10-CM | POA: Diagnosis present

## 2015-01-09 DIAGNOSIS — O411230 Chorioamnionitis, third trimester, not applicable or unspecified: Secondary | ICD-10-CM | POA: Diagnosis not present

## 2015-01-09 DIAGNOSIS — Z3A37 37 weeks gestation of pregnancy: Secondary | ICD-10-CM | POA: Diagnosis present

## 2015-01-09 LAB — CBC
HCT: 31 % — ABNORMAL LOW (ref 36.0–46.0)
Hemoglobin: 10.1 g/dL — ABNORMAL LOW (ref 12.0–15.0)
MCH: 28.1 pg (ref 26.0–34.0)
MCHC: 32.6 g/dL (ref 30.0–36.0)
MCV: 86.4 fL (ref 78.0–100.0)
PLATELETS: 256 10*3/uL (ref 150–400)
RBC: 3.59 MIL/uL — ABNORMAL LOW (ref 3.87–5.11)
RDW: 14.4 % (ref 11.5–15.5)
WBC: 7.9 10*3/uL (ref 4.0–10.5)

## 2015-01-09 LAB — TYPE AND SCREEN
ABO/RH(D): O POS
ANTIBODY SCREEN: NEGATIVE

## 2015-01-09 MED ORDER — OXYTOCIN 40 UNITS IN LACTATED RINGERS INFUSION - SIMPLE MED: 62.5000 mL/h | INTRAVENOUS | Status: DC

## 2015-01-09 MED ORDER — LACTATED RINGERS IV SOLN: 500.0000 mL | INTRAVENOUS | Status: DC | PRN

## 2015-01-09 MED ORDER — OXYCODONE-ACETAMINOPHEN 5-325 MG PO TABS: 1.0000 | ORAL_TABLET | ORAL | Status: DC | PRN

## 2015-01-09 MED ORDER — MISOPROSTOL 200 MCG PO TABS
50.0000 ug | ORAL_TABLET | ORAL | Status: DC
  Administered 2015-01-09: 50 ug via ORAL
  Filled 2015-01-09: qty 0.5

## 2015-01-09 MED ORDER — LACTATED RINGERS IV SOLN
INTRAVENOUS | Status: DC
  Administered 2015-01-09 – 2015-01-11 (×5): via INTRAVENOUS

## 2015-01-09 MED ORDER — ACETAMINOPHEN 325 MG PO TABS: 650.0000 mg | ORAL_TABLET | ORAL | Status: DC | PRN

## 2015-01-09 MED ORDER — LIDOCAINE HCL (PF) 1 % IJ SOLN
30.0000 mL | INTRAMUSCULAR | Status: DC | PRN
  Filled 2015-01-09: qty 30

## 2015-01-09 MED ORDER — OXYCODONE-ACETAMINOPHEN 5-325 MG PO TABS
2.0000 | ORAL_TABLET | ORAL | Status: DC | PRN
  Administered 2015-01-10: 2 via ORAL
  Filled 2015-01-09: qty 2

## 2015-01-09 MED ORDER — CITRIC ACID-SODIUM CITRATE 334-500 MG/5ML PO SOLN
30.0000 mL | ORAL | Status: DC | PRN
  Filled 2015-01-09: qty 30

## 2015-01-09 MED ORDER — ONDANSETRON HCL 4 MG/2ML IJ SOLN
4.0000 mg | Freq: Four times a day (QID) | INTRAMUSCULAR | Status: DC | PRN
  Administered 2015-01-10 – 2015-01-11 (×2): 4 mg via INTRAVENOUS
  Filled 2015-01-09 (×2): qty 2

## 2015-01-09 MED ORDER — FLEET ENEMA 7-19 GM/118ML RE ENEM: 1.0000 | ENEMA | RECTAL | Status: DC | PRN

## 2015-01-09 MED ORDER — HYDRALAZINE HCL 20 MG/ML IJ SOLN
10.0000 mg | Freq: Once | INTRAMUSCULAR | Status: AC
  Administered 2015-01-09: 10 mg via INTRAVENOUS
  Filled 2015-01-09: qty 1

## 2015-01-09 MED ORDER — OXYTOCIN BOLUS FROM INFUSION
500.0000 mL | INTRAVENOUS | Status: DC
  Administered 2015-01-11: 500 mL via INTRAVENOUS

## 2015-01-09 MED ORDER — TERBUTALINE SULFATE 1 MG/ML IJ SOLN: 0.2500 mg | Freq: Once | INTRAMUSCULAR | Status: AC | PRN

## 2015-01-09 MED ORDER — HYDRALAZINE HCL 20 MG/ML IJ SOLN
10.0000 mg | Freq: Once | INTRAMUSCULAR | Status: AC
  Administered 2015-01-09: 10 mg via INTRAMUSCULAR
  Filled 2015-01-09: qty 1

## 2015-01-09 NOTE — H&P (Signed)
Alyssa Mathis is a 29 y.o. female presenting for IOL for pre eclampsia at 37 wks. History OB History    Gravida Para Term Preterm AB TAB SAB Ectopic Multiple Living   1              Past Medical History  Diagnosis Date  . Medical history non-contributory   . Anemia   . Hypertension   . Pregnancy induced hypertension    Past Surgical History  Procedure Laterality Date  . No past surgeries     Family History: family history includes Diabetes in her maternal grandfather; Hypertension in her maternal grandfather, maternal grandmother, and sister. Social History:  reports that she has never smoked. She does not have any smokeless tobacco history on file. She reports that she does not drink alcohol or use illicit drugs.   Prenatal Transfer Tool  Maternal Diabetes: No Genetic Screening: Normal Maternal Ultrasounds/Referrals: Normal Fetal Ultrasounds or other Referrals:  None Maternal Substance Abuse:  No Significant Maternal Medications:  None Significant Maternal Lab Results:  None Other Comments:  pre eclampsia  Review of Systems  Constitutional: Negative.   HENT: Negative.   Eyes: Negative.   Respiratory: Negative.   Cardiovascular: Negative.   Gastrointestinal: Negative.   Genitourinary: Negative.   Musculoskeletal: Negative.   Skin: Negative.   Neurological: Negative.   Endo/Heme/Allergies: Negative.   Psychiatric/Behavioral: Negative.     Dilation: Fingertip Effacement (%): 80 Station: -1, -2 Exam by:: Darlene, CNM There were no vitals taken for this visit.  BP 135/104 mmHg  Pulse 109  Resp 20  Ht 5\' 3"  (1.6 m)  Wt 159 lb (72.122 kg)  BMI 28.17 kg/m2  Maternal Exam:  Uterine Assessment: No contractions  Abdomen: Patient reports no abdominal tenderness. Fetal presentation: vertex  Introitus: Normal vulva. Normal vagina.  Amniotic fluid character: not assessed.  Pelvis: adequate for delivery.   Cervix: Cervix evaluated by digital exam.     Fetal  Exam Fetal Monitor Review: Mode: ultrasound.   Variability: moderate (6-25 bpm).   Pattern: accelerations present.    Fetal State Assessment: Category I - tracings are normal.     Physical Exam  Constitutional: She is oriented to person, place, and time. She appears well-developed and well-nourished.  HENT:  Head: Normocephalic.  Neck: Normal range of motion.  Cardiovascular: Normal rate, regular rhythm, normal heart sounds and intact distal pulses.   Respiratory: Effort normal and breath sounds normal.  GI: Soft. Bowel sounds are normal.  Genitourinary: Vagina normal and uterus normal.  Musculoskeletal: Normal range of motion.  Neurological: She is alert and oriented to person, place, and time. She has normal reflexes.  Skin: Skin is warm and dry.  Psychiatric: She has a normal mood and affect. Her behavior is normal. Judgment and thought content normal.    Prenatal labs: ABO, Rh: O/Positive/-- (09/29 0000) Antibody: Negative (09/29 0000) Rubella: Immune (09/29 0000) RPR: Nonreactive (02/08 0000)  HBsAg: Negative (09/29 0000)  HIV: Non-reactive (09/29 0000)  GBS: Negative (03/28 0000)   Assessment/Plan: SVE vertyex presentation/1cm/80/-2. Will start cytotec IOL   [name], [name] 01/09/2015, 8:16 PM

## 2015-01-10 ENCOUNTER — Other Ambulatory Visit: Payer: Medicaid Other

## 2015-01-10 ENCOUNTER — Encounter (HOSPITAL_COMMUNITY): Payer: Self-pay

## 2015-01-10 ENCOUNTER — Inpatient Hospital Stay (HOSPITAL_COMMUNITY): Payer: Medicaid Other | Admitting: Anesthesiology

## 2015-01-10 ENCOUNTER — Encounter: Payer: Medicaid Other | Admitting: Family Medicine

## 2015-01-10 LAB — COMPREHENSIVE METABOLIC PANEL
ALK PHOS: 120 U/L — AB (ref 39–117)
ALT: 24 U/L (ref 0–35)
AST: 31 U/L (ref 0–37)
Albumin: 2.8 g/dL — ABNORMAL LOW (ref 3.5–5.2)
Anion gap: 8 (ref 5–15)
BUN: 6 mg/dL (ref 6–23)
CALCIUM: 9 mg/dL (ref 8.4–10.5)
CO2: 20 mmol/L (ref 19–32)
CREATININE: 0.6 mg/dL (ref 0.50–1.10)
Chloride: 108 mmol/L (ref 96–112)
GFR calc Af Amer: >90 mL/min (ref >90)
Glucose, Bld: 82 mg/dL (ref 70–99)
POTASSIUM: 3.6 mmol/L (ref 3.5–5.1)
Sodium: 136 mmol/L (ref 135–145)
Total Bilirubin: 0.6 mg/dL (ref 0.3–1.2)
Total Protein: 5.9 g/dL — ABNORMAL LOW (ref 6.0–8.3)

## 2015-01-10 LAB — PROTEIN / CREATININE RATIO, URINE
CREATININE, URINE: 80 mg/dL
Protein Creatinine Ratio: 0.19 — ABNORMAL HIGH (ref 0.00–0.15)
Total Protein, Urine: 15 mg/dL

## 2015-01-10 LAB — CBC
HCT: 31.4 % — ABNORMAL LOW (ref 36.0–46.0)
HEMOGLOBIN: 10.2 g/dL — AB (ref 12.0–15.0)
MCH: 27.8 pg (ref 26.0–34.0)
MCHC: 32.5 g/dL (ref 30.0–36.0)
MCV: 85.6 fL (ref 78.0–100.0)
Platelets: 236 10*3/uL (ref 150–400)
RBC: 3.67 MIL/uL — ABNORMAL LOW (ref 3.87–5.11)
RDW: 14.9 % (ref 11.5–15.5)
WBC: 9.8 10*3/uL (ref 4.0–10.5)

## 2015-01-10 LAB — ABO/RH: ABO/RH(D): O POS

## 2015-01-10 LAB — RPR: RPR Ser Ql: NONREACTIVE

## 2015-01-10 MED ORDER — FENTANYL 2.5 MCG/ML BUPIVACAINE 1/10 % EPIDURAL INFUSION (WH - ANES)
14.0000 mL/h | INTRAMUSCULAR | Status: DC | PRN
  Administered 2015-01-10 – 2015-01-11 (×2): 14 mL/h via EPIDURAL
  Filled 2015-01-10 (×2): qty 125

## 2015-01-10 MED ORDER — EPHEDRINE 5 MG/ML INJ
10.0000 mg | INTRAVENOUS | Status: DC | PRN
  Filled 2015-01-10: qty 2

## 2015-01-10 MED ORDER — LACTATED RINGERS IV SOLN: 500.0000 mL | Freq: Once | INTRAVENOUS | Status: DC

## 2015-01-10 MED ORDER — PHENYLEPHRINE 40 MCG/ML (10ML) SYRINGE FOR IV PUSH (FOR BLOOD PRESSURE SUPPORT)
80.0000 ug | PREFILLED_SYRINGE | INTRAVENOUS | Status: DC | PRN
  Filled 2015-01-10: qty 2

## 2015-01-10 MED ORDER — LIDOCAINE HCL (PF) 1 % IJ SOLN
INTRAMUSCULAR | Status: DC | PRN
  Administered 2015-01-10 (×2): 9 mL

## 2015-01-10 MED ORDER — DIPHENHYDRAMINE HCL 50 MG/ML IJ SOLN: 12.5000 mg | INTRAMUSCULAR | Status: DC | PRN

## 2015-01-10 MED ORDER — HYDRALAZINE HCL 20 MG/ML IJ SOLN
10.0000 mg | Freq: Once | INTRAMUSCULAR | Status: DC
  Filled 2015-01-10: qty 1

## 2015-01-10 MED ORDER — HYDRALAZINE HCL 20 MG/ML IJ SOLN
10.0000 mg | Freq: Once | INTRAMUSCULAR | Status: AC
  Administered 2015-01-10: 10 mg via INTRAVENOUS
  Filled 2015-01-10: qty 1

## 2015-01-10 MED ORDER — PHENYLEPHRINE 40 MCG/ML (10ML) SYRINGE FOR IV PUSH (FOR BLOOD PRESSURE SUPPORT)
80.0000 ug | PREFILLED_SYRINGE | INTRAVENOUS | Status: DC | PRN
  Filled 2015-01-10: qty 20
  Filled 2015-01-10: qty 2

## 2015-01-10 MED ORDER — TERBUTALINE SULFATE 1 MG/ML IJ SOLN: 0.2500 mg | Freq: Once | INTRAMUSCULAR | Status: AC | PRN

## 2015-01-10 MED ORDER — MAGNESIUM SULFATE BOLUS VIA INFUSION
4.0000 g | Freq: Once | INTRAVENOUS | Status: AC
  Administered 2015-01-10: 4 g via INTRAVENOUS
  Filled 2015-01-10: qty 500

## 2015-01-10 MED ORDER — OXYTOCIN 40 UNITS IN LACTATED RINGERS INFUSION - SIMPLE MED
1.0000 m[IU]/min | INTRAVENOUS | Status: DC
  Administered 2015-01-10: 2 m[IU]/min via INTRAVENOUS
  Filled 2015-01-10: qty 1000

## 2015-01-10 MED ORDER — FENTANYL CITRATE (PF) 100 MCG/2ML IJ SOLN
100.0000 ug | INTRAMUSCULAR | Status: DC | PRN
  Administered 2015-01-10 (×3): 100 ug via INTRAVENOUS
  Filled 2015-01-10 (×3): qty 2

## 2015-01-10 MED ORDER — MAGNESIUM SULFATE 50 % IJ SOLN
2.0000 g/h | INTRAVENOUS | Status: DC
  Administered 2015-01-11: 2 g/h via INTRAVENOUS
  Filled 2015-01-10 (×2): qty 80

## 2015-01-10 NOTE — Anesthesia Preprocedure Evaluation (Signed)
Anesthesia Evaluation  Patient identified by MRN, date of birth, ID band Patient awake    Reviewed: Allergy & Precautions, H&P , NPO status , Patient's Chart, lab work & pertinent test results  Airway Mallampati: I  TM Distance: >3 FB Neck ROM: full    Dental no notable dental hx.    Pulmonary neg pulmonary ROS,    Pulmonary exam normal       Cardiovascular hypertension,     Neuro/Psych negative neurological ROS  negative psych ROS   GI/Hepatic negative GI ROS, Neg liver ROS,   Endo/Other  negative endocrine ROS  Renal/GU negative Renal ROS     Musculoskeletal   Abdominal Normal abdominal exam  (+)   Peds  Hematology negative hematology ROS (+)   Anesthesia Other Findings   Reproductive/Obstetrics (+) Pregnancy                             Anesthesia Physical Anesthesia Plan  ASA: II  Anesthesia Plan: Epidural   Post-op Pain Management:    Induction:   Airway Management Planned:   Additional Equipment:   Intra-op Plan:   Post-operative Plan:   Informed Consent: I have reviewed the patients History and Physical, chart, labs and discussed the procedure including the risks, benefits and alternatives for the proposed anesthesia with the patient or authorized representative who has indicated his/her understanding and acceptance.     Plan Discussed with:   Anesthesia Plan Comments:         Anesthesia Quick Evaluation  

## 2015-01-10 NOTE — Progress Notes (Signed)
Alyssa Mathis is a 29 y.o. G1P0 at [redacted]w[redacted]d admitted for induction of labor due to Pre-eclamptic toxemia of pregnancy..  Subjective: Coping well with contractions following IV FEntanyl & po Percocet  Objective: BP 146/101 mmHg  Pulse 107  Temp(Src) 98.3 F (36.8 C) (Oral)  Resp 18  Ht 5\' 3"  (1.6 m)  Wt 72.122 kg (159 lb)  BMI 28.17 kg/m2    FHT:  FHR: 140 bpm, variability: minimal ,  accelerations:  Abscent,  decelerations:  Absent UC:   regular, every 2 minutes  SVE:   Dilation: 1 Effacement (%): 80 Station: -2 Exam by:: Violeta GelinasG. Whitfield, RN  Pitocin @ 2 mu/min  Labs: Lab Results  Component Value Date   WBC 7.9 01/09/2015   HGB 10.1* 01/09/2015   HCT 31.0* 01/09/2015   MCV 86.4 01/09/2015   PLT 256 01/09/2015    Assessment / Plan: Induction of labor due to preeclampsia,  progressing well on pitocin  Labor: Progressing on Pitocin, will continue to increase then AROM Fetal Wellbeing:  Category I Pain Control:  Fentanyl Pre-eclampsia: Labs stable, BP elevated, +HA, will start Mag I/D:  GBS negative Anticipated MOD:  NSVD  Alyssa Mathis,Alyssa Mathis SNM 01/10/2015, 9:59 AM   Doing well Filed Vitals:   01/10/15 1400 01/10/15 1436 01/10/15 1503 01/10/15 1505  BP: 148/92 145/98 154/108 156/97  Pulse: 104 97 97 98  Temp: 97.9 F (36.6 C)     TempSrc: Oral     Resp: 18 18 18    Height:      Weight:       Fetal heart rate reassuring Continue to observe Agree with note Aviva SignsMarie L Williams, CNM

## 2015-01-10 NOTE — Progress Notes (Signed)
Alyssa Mathis is a 29 y.o. G1P0 at 6056w1d admitted for induction of labor due to Pre-eclamptic toxemia of pregnancy..  Subjective: Declines pain medication at this time.  Dozing thru contractions. Objective: BP 148/92 mmHg  Pulse 104  Temp(Src) 97.9 F (36.6 C) (Oral)  Resp 18  Ht 5\' 3"  (1.6 m)  Wt 72.122 kg (159 lb)  BMI 28.17 kg/m2 Total I/O In: 986.4 [P.O.:60; I.V.:926.4] Out: 500 [Urine:500]  FHT:  FHR: 140 bpm, variability: moderate,  accelerations:  Present,  decelerations:  Absent UC:   regular, every 2-4 minutes, mild  SVE:   Dilation: 1 Effacement (%): 80 Station: -2 Exam by:: Violeta GelinasG. Whitfield, RN  Pitocin @ 4 mu/min  Labs: Lab Results  Component Value Date   WBC 7.9 01/09/2015   HGB 10.1* 01/09/2015   HCT 31.0* 01/09/2015   MCV 86.4 01/09/2015   PLT 256 01/09/2015    Assessment / Plan: Induction of labor due to pre-eclampsia, no cervical change on pitocin  Labor: No cervical change in >8 hrs on pitocin.  Will take pitocin break, and allow patient to eat.  Foley bulb placed, and will re-start pitocin after patient eats. Fetal Wellbeing:  Category I Pain Control:  Labor support without medications Pre-eclampsia: Stable, on MgSO4. I/D:  GBS negative Anticipated MOD:  NSVD  Ross,Lisa Wynne SNM 01/10/2015, 2:38 PM  Agree with note AVSS Filed Vitals:   01/10/15 1400 01/10/15 1436 01/10/15 1503 01/10/15 1505  BP: 148/92 145/98 154/108 156/97  Pulse: 104 97 97 98  Temp: 97.9 F (36.6 C)     TempSrc: Oral     Resp: 18 18 18    Height:      Weight:       Plan to continue induction of labor Anticipate increased dilation with foley bulb Aviva SignsMarie L Stevie Ertle, CNM

## 2015-01-10 NOTE — Anesthesia Procedure Notes (Signed)
Epidural Patient location during procedure: OB Start time: 01/10/2015 11:08 PM End time: 01/10/2015 11:12 PM  Staffing Anesthesiologist: Leilani AbleHATCHETT, Daanya Lanphier Performed by: anesthesiologist   Preanesthetic Checklist Completed: patient identified, surgical consent, pre-op evaluation, timeout performed, IV checked, risks and benefits discussed and monitors and equipment checked  Epidural Patient position: sitting Prep: site prepped and draped and DuraPrep Patient monitoring: continuous pulse ox and blood pressure Approach: midline Location: L3-L4 Injection technique: LOR air  Needle:  Needle type: Tuohy  Needle gauge: 17 G Needle length: 9 cm and 9 Needle insertion depth: 4 cm Catheter type: closed end flexible Catheter size: 19 Gauge Catheter at skin depth: 9 cm Test dose: negative and Other  Assessment Sensory level: T9 Events: blood not aspirated, injection not painful, no injection resistance, negative IV test and no paresthesia  Additional Notes Reason for block:procedure for pain

## 2015-01-10 NOTE — Progress Notes (Signed)
Geralyn Kizzie BaneHughes is a 29 y.o. G1P0 at 8732w1d admitted for induction of labor due to Pre-eclamptic toxemia of pregnancy..  Subjective: Rates her pain 7/10, but declines medication at this time.  Objective: BP 153/85 mmHg  Pulse 117  Temp(Src) 98.3 F (36.8 C) (Oral)  Resp 18  Ht 5\' 3"  (1.6 m)  Wt 72.122 kg (159 lb)  BMI 28.17 kg/m2    FHT:  FHR: 140 bpm, variability: moderate,  accelerations:  Present,  decelerations:  Absent UC:   regular, every 2-4 minutes.  SVE:   Dilation: 5 Effacement (%): 80 Station: -2 Exam by:: foley,rn  Pitocin @ 8 mu/min  Labs: Lab Results  Component Value Date   WBC 7.9 01/09/2015   HGB 10.1* 01/09/2015   HCT 31.0* 01/09/2015   MCV 86.4 01/09/2015   PLT 256 01/09/2015    Assessment / Plan: Induction of labor due to preeclampsia,  progressing well on pitocin  Labor: Progressing normally on Pitocin.  Foley bulb came out & pt 5 cm.  Continue to increase pitocin, then AROM. Fetal Wellbeing:  Category I Pain Control:  Labor support without medications Pre-eclampsia: BP elevated, but only occasionally severe range.  Filed Vitals:   01/10/15 1801 01/10/15 1830 01/10/15 1905 01/10/15 1906  BP: 162/97 158/94 177/114 153/85  Pulse: 118 104 125 117  Temp:    98.3 F (36.8 C)  TempSrc:    Oral  Resp: 18   18  Height:      Weight:       I/D:  GBS negative Anticipated MOD:  NSVD  Ross,Lisa Wynne SNM 01/10/2015, 7:22 PM  Seen also by me Agree with note Aviva SignsMarie L Maelee Hoot, CNM

## 2015-01-10 NOTE — Progress Notes (Signed)
Alyssa Mathis is a 29 y.o. G1P0 at 5865w1d by ultrasound admitted for induction of labor due to Pre-eclamptic toxemia of pregnancy..  Subjective:   Objective: BP 137/77 mmHg  Pulse 92  Temp(Src) 98.3 F (36.8 C) (Oral)  Resp 20  Ht 5\' 3"  (1.6 m)  Wt 159 lb (72.122 kg)  BMI 28.17 kg/m2      FHT:  FHR: 150 bpm, variability: moderate,  accelerations:  Present,  decelerations:  Absent UC:   regular, every 2 minutes SVE:   Dilation: Fingertip Effacement (%): 80 Station: -1, -2 Exam by:: Alyssa Mathis,CNM  Labs: Lab Results  Component Value Date   WBC 7.9 01/09/2015   HGB 10.1* 01/09/2015   HCT 31.0* 01/09/2015   MCV 86.4 01/09/2015   PLT 256 01/09/2015    Assessment / Plan: Induction of labor due to preeclampsia,  progressing well on pitocin  Labor: SVE 1-2/90/-1 will start pitocin Preeclampsia:  intake and ouput balanced and labs stable Fetal Wellbeing:  Category I Pain Control:  Labor support without medications I/D:  n/a Anticipated MOD:  NSVD  Mathis, Alyssa Mathis 01/10/2015, 6:17 AM

## 2015-01-11 ENCOUNTER — Encounter (HOSPITAL_COMMUNITY): Payer: Self-pay

## 2015-01-11 DIAGNOSIS — Z3A37 37 weeks gestation of pregnancy: Secondary | ICD-10-CM

## 2015-01-11 DIAGNOSIS — O1493 Unspecified pre-eclampsia, third trimester: Secondary | ICD-10-CM

## 2015-01-11 DIAGNOSIS — O411230 Chorioamnionitis, third trimester, not applicable or unspecified: Secondary | ICD-10-CM

## 2015-01-11 LAB — CBC
HCT: 29.3 % — ABNORMAL LOW (ref 36.0–46.0)
HEMOGLOBIN: 9.5 g/dL — AB (ref 12.0–15.0)
MCH: 27.8 pg (ref 26.0–34.0)
MCHC: 32.4 g/dL (ref 30.0–36.0)
MCV: 85.7 fL (ref 78.0–100.0)
Platelets: 240 10*3/uL (ref 150–400)
RBC: 3.42 MIL/uL — AB (ref 3.87–5.11)
RDW: 14.9 % (ref 11.5–15.5)
WBC: 14.4 10*3/uL — AB (ref 4.0–10.5)

## 2015-01-11 LAB — MRSA PCR SCREENING: MRSA BY PCR: NEGATIVE

## 2015-01-11 MED ORDER — BISACODYL 10 MG RE SUPP: 10.0000 mg | Freq: Every day | RECTAL | Status: DC | PRN

## 2015-01-11 MED ORDER — ZOLPIDEM TARTRATE 5 MG PO TABS: 5.0000 mg | ORAL_TABLET | Freq: Every evening | ORAL | Status: DC | PRN

## 2015-01-11 MED ORDER — MISOPROSTOL 200 MCG PO TABS
1000.0000 ug | ORAL_TABLET | Freq: Once | ORAL | Status: AC
  Administered 2015-01-11: 1000 ug via RECTAL

## 2015-01-11 MED ORDER — MISOPROSTOL 200 MCG PO TABS
ORAL_TABLET | ORAL | Status: AC
Start: 2015-01-11 — End: 2015-01-11
  Administered 2015-01-11: 1000 ug via RECTAL
  Filled 2015-01-11: qty 5

## 2015-01-11 MED ORDER — ONDANSETRON HCL 4 MG/2ML IJ SOLN: 4.0000 mg | INTRAMUSCULAR | Status: DC | PRN

## 2015-01-11 MED ORDER — LANOLIN HYDROUS EX OINT: TOPICAL_OINTMENT | CUTANEOUS | Status: DC | PRN

## 2015-01-11 MED ORDER — IBUPROFEN 600 MG PO TABS
600.0000 mg | ORAL_TABLET | Freq: Four times a day (QID) | ORAL | Status: DC
  Administered 2015-01-11 – 2015-01-13 (×6): 600 mg via ORAL
  Filled 2015-01-11 (×8): qty 1

## 2015-01-11 MED ORDER — CARBOPROST TROMETHAMINE 250 MCG/ML IM SOLN
250.0000 ug | INTRAMUSCULAR | Status: DC | PRN
  Administered 2015-01-11: 250 ug via INTRAMUSCULAR

## 2015-01-11 MED ORDER — ACETAMINOPHEN 325 MG PO TABS: 650.0000 mg | ORAL_TABLET | ORAL | Status: DC | PRN

## 2015-01-11 MED ORDER — PRENATAL MULTIVITAMIN CH
1.0000 | ORAL_TABLET | Freq: Every day | ORAL | Status: DC
  Administered 2015-01-11 – 2015-01-13 (×3): 1 via ORAL
  Filled 2015-01-11 (×3): qty 1

## 2015-01-11 MED ORDER — FERROUS SULFATE 325 (65 FE) MG PO TABS
325.0000 mg | ORAL_TABLET | Freq: Two times a day (BID) | ORAL | Status: DC
  Administered 2015-01-11 – 2015-01-12 (×3): 325 mg via ORAL
  Filled 2015-01-11 (×4): qty 1

## 2015-01-11 MED ORDER — SODIUM CHLORIDE 0.9 % IJ SOLN: 3.0000 mL | INTRAMUSCULAR | Status: DC | PRN

## 2015-01-11 MED ORDER — WITCH HAZEL-GLYCERIN EX PADS: 1.0000 "application " | MEDICATED_PAD | CUTANEOUS | Status: DC | PRN

## 2015-01-11 MED ORDER — FLEET ENEMA 7-19 GM/118ML RE ENEM: 1.0000 | ENEMA | Freq: Every day | RECTAL | Status: DC | PRN

## 2015-01-11 MED ORDER — SODIUM CHLORIDE 0.9 % IJ SOLN: 3.0000 mL | Freq: Two times a day (BID) | INTRAMUSCULAR | Status: DC

## 2015-01-11 MED ORDER — BENZOCAINE-MENTHOL 20-0.5 % EX AERO
1.0000 "application " | INHALATION_SPRAY | CUTANEOUS | Status: DC | PRN
  Administered 2015-01-11: 1 via TOPICAL
  Filled 2015-01-11: qty 56

## 2015-01-11 MED ORDER — DIBUCAINE 1 % RE OINT: 1.0000 "application " | TOPICAL_OINTMENT | RECTAL | Status: DC | PRN

## 2015-01-11 MED ORDER — ONDANSETRON HCL 4 MG PO TABS: 4.0000 mg | ORAL_TABLET | ORAL | Status: DC | PRN

## 2015-01-11 MED ORDER — MAGNESIUM SULFATE 50 % IJ SOLN
2.0000 g/h | INTRAVENOUS | Status: DC
  Administered 2015-01-12: 2 g/h via INTRAVENOUS
  Filled 2015-01-11: qty 80

## 2015-01-11 MED ORDER — DIPHENHYDRAMINE HCL 25 MG PO CAPS: 25.0000 mg | ORAL_CAPSULE | Freq: Four times a day (QID) | ORAL | Status: DC | PRN

## 2015-01-11 MED ORDER — SENNOSIDES-DOCUSATE SODIUM 8.6-50 MG PO TABS
2.0000 | ORAL_TABLET | ORAL | Status: DC
  Administered 2015-01-12: 2 via ORAL
  Filled 2015-01-11 (×2): qty 2

## 2015-01-11 MED ORDER — LACTATED RINGERS IV SOLN
INTRAVENOUS | Status: DC
  Administered 2015-01-11 – 2015-01-12 (×2): via INTRAVENOUS

## 2015-01-11 MED ORDER — CARBOPROST TROMETHAMINE 250 MCG/ML IM SOLN
INTRAMUSCULAR | Status: AC
  Administered 2015-01-11: 250 ug via INTRAMUSCULAR
  Filled 2015-01-11: qty 1

## 2015-01-11 MED ORDER — DIPHENOXYLATE-ATROPINE 2.5-0.025 MG PO TABS
2.0000 | ORAL_TABLET | Freq: Four times a day (QID) | ORAL | Status: DC | PRN
  Administered 2015-01-11: 2 via ORAL
  Filled 2015-01-11: qty 2

## 2015-01-11 MED ORDER — SIMETHICONE 80 MG PO CHEW: 80.0000 mg | CHEWABLE_TABLET | ORAL | Status: DC | PRN

## 2015-01-11 MED ORDER — OXYTOCIN 40 UNITS IN LACTATED RINGERS INFUSION - SIMPLE MED: 62.5000 mL/h | INTRAVENOUS | Status: DC | PRN

## 2015-01-11 MED ORDER — SODIUM CHLORIDE 0.9 % IV SOLN: 250.0000 mL | INTRAVENOUS | Status: DC | PRN

## 2015-01-11 NOTE — Progress Notes (Signed)
Dr. Althea CharonGottschoalk came and educated pt about procedure/policy of keeping baby in the room while on magnesium. Pt agrees to let baby go to nursery when needed and understands that she may call nursery or nurse to have baby sent back up to the AICU at any time.

## 2015-01-11 NOTE — Progress Notes (Signed)
UR chart review completed.  

## 2015-01-11 NOTE — Plan of Care (Signed)
Problem: Phase II Progression Outcomes Goal: Other Phase II Outcomes/Goals Outcome: Completed/Met Date Met:  01/11/15 Discussed with pt the signs of preeclampsia.  Pt verbalized understanding and will inform RN of worsening symptoms.

## 2015-01-11 NOTE — Progress Notes (Signed)
Pt informed that support person must be present throughout the night to help take care of baby while she is on magnesium, per protocol. Pt is refusing; I educated her on safety risks and called MD to discuss this situation with pt.

## 2015-01-11 NOTE — Lactation Note (Addendum)
This note was copied from the chart of Alyssa Mathis. Lactation Consultation Note  Patient Name: Alyssa Mathis: 01/11/2015  Baby 4 hours of life. Mom had originally stated that she wanted to nurse and bottle-feed. However, patient's AICU RN states that mom has decided that she is only going to bottle/formula-feed now.    Maternal Data    Feeding Feeding Type: Bottle Fed - Formula Nipple Type: Slow - flow  LATCH Score/Interventions                      Lactation Tools Discussed/Used     Consult Status      Geralynn OchsWILLIARD, Reginia Battie 01/11/2015, 2:46 PM

## 2015-01-11 NOTE — Progress Notes (Signed)
Pt. Gave permission for Mom (Channa Romeo AppleHarrison) and sister Evern Bio(Jenita Stillson) to have notes for work showing they were here.

## 2015-01-12 LAB — CBC
HCT: 23.2 % — ABNORMAL LOW (ref 36.0–46.0)
Hemoglobin: 7.6 g/dL — ABNORMAL LOW (ref 12.0–15.0)
MCH: 28.4 pg (ref 26.0–34.0)
MCHC: 32.8 g/dL (ref 30.0–36.0)
MCV: 86.6 fL (ref 78.0–100.0)
PLATELETS: 240 10*3/uL (ref 150–400)
RBC: 2.68 MIL/uL — AB (ref 3.87–5.11)
RDW: 15.2 % (ref 11.5–15.5)
WBC: 13.7 10*3/uL — AB (ref 4.0–10.5)

## 2015-01-12 MED ORDER — FERROUS SULFATE 325 (65 FE) MG PO TABS: 325.0000 mg | ORAL_TABLET | Freq: Two times a day (BID) | ORAL | Status: DC

## 2015-01-12 NOTE — Progress Notes (Signed)
Post Partum Day 1 Subjective: no complaints, up ad lib, voiding and tolerating PO  Objective: Blood pressure 106/69, pulse 99, temperature 98.4 F (36.9 C), temperature source Oral, resp. rate 16, height 5' 3.5" (1.613 m), weight 153 lb (69.4 kg), SpO2 99 %, unknown if currently breastfeeding.  Physical Exam:  General: alert, cooperative and no distress Lochia: appropriate Uterine Fundus: firm Incision: healing well DVT Evaluation: No evidence of DVT seen on physical exam. Negative Homan's sign. No cords or calf tenderness.   Recent Labs  01/10/15 2055 01/11/15 1001  HGB 10.2* 9.5*  HCT 31.4* 29.3*    Assessment/Plan: Plan for discharge tomorrow.   Magnesium to be discontinued at 9am.   Transfer to floor with stable BP BP stable now.  Good diuresis.   LOS: 3 days   STINSON, JACOB JEHIEL 01/12/2015, 6:59 AM

## 2015-01-12 NOTE — Anesthesia Postprocedure Evaluation (Signed)
Anesthesia Post Note  Patient: Alyssa Mathis  Procedure(s) Performed: * No procedures listed *  Anesthesia type: Epidural  Patient location: Mother/Baby  Post pain: Pain level controlled  Post assessment: Post-op Vital signs reviewed  Last Vitals:  Filed Vitals:   01/12/15 0700  BP: 132/80  Pulse: 102  Temp:   Resp: 18    Post vital signs: Reviewed  Level of consciousness:alert  Complications: No apparent anesthesia complications

## 2015-01-13 MED ORDER — TRIAMTERENE-HCTZ 37.5-25 MG PO TABS
1.0000 | ORAL_TABLET | Freq: Every day | ORAL | Status: DC
  Administered 2015-01-13: 1 via ORAL
  Filled 2015-01-13: qty 1

## 2015-01-13 MED ORDER — FERROUS SULFATE 325 (65 FE) MG PO TABS: 325.0000 mg | ORAL_TABLET | Freq: Two times a day (BID) | ORAL | Status: DC

## 2015-01-13 MED ORDER — IBUPROFEN 600 MG PO TABS: 600.0000 mg | ORAL_TABLET | Freq: Four times a day (QID) | ORAL | Status: DC

## 2015-01-13 NOTE — Discharge Summary (Signed)
Obstetric Discharge Summary Reason for Admission: induction of labor Prenatal Procedures: Preeclampsia and ultrasound Intrapartum Procedures: spontaneous vaginal delivery Postpartum Procedures: Magnesium IV Complications-Operative and Postpartum: periurethral laceration  Patient is 29 y.o. G1P0 3360w2d admitted for IOL 2/2 severe preeclampsia on mag x >24h   Delivery Note At 9:16 AM a viable female was delivered via Vaginal, Spontaneous Delivery (Presentation: Right Occiput Anterior). APGAR: 8, 9; weight .  Placenta status: Intact, Spontaneous. Cord: 3 vessels with the following complications: none  Anesthesia: Epidural  Episiotomy: None Lacerations: 1st degree;periurethral Suture Repair: 3.0 vicryl rapide Est. Blood Loss (mL): 720mL  PPH: initially with increased bleeding, large clot evacuated from uterus. Given 1000mcg cytotec PR. Still boggy uterus, given hemabate IM. Bimanual massage x 3 minutes with improved uterine tone. Cervix inspected and no lacerations. Subsequent small periurethral tear noted and repaired with figure of eight after foley placed. Mom to AICU for mag x 24h. Baby to Couplet care / Skin to Skin.  ACOSTA,KRISTY ROCIO 01/11/2015, 10:37 AM  Addendum: Strongly suspect chorioamnionitis, very strong odor after delivery very characteristic of chorioamnionitis, pediatricians notified as this was an addendum to delivery summary. Mother afebrile, no fetal tachycardia however mother was tachycardia during entirety of labor. Discussed with Dr. West Carbohandler  ACOSTA,KRISTY ROCIO, MD 4:02 [facility]  Hospital Course:  Active Problems:   Pre-eclampsia in third trimester   Kirrah Kizzie BaneHughes is a 29 y.o. G1P1001 s/p SVD.  Patient was admitted IOL 2/2 preeclampsia.  She was treated with IV Magnesium.  She had a postpartum course that was uncomplicated including no problems with ambulating, PO intake, urination, pain, or bleeding. The patient feels ready to go home and will be  discharged with outpatient follow-up.   Today: No acute events overnight.  Pt denies problems with ambulating, voiding or po intake.  She denies nausea or vomiting. She denies headache, vision changes, or dizziness. Pain is well controlled.  She has had flatus. She has had bowel movement.  Lochia Small.  Plan for birth control is abstinence.  Method of Feeding: Bottle  Physical Exam:  Blood pressure 147/95, pulse 96, temperature 99.1 F (37.3 C), temperature source Oral, resp. rate 20, height 5' 3.5" (1.613 m), weight 153 lb (69.4 kg), SpO2 99 %, unknown if currently breastfeeding.  General: alert, cooperative, appears stated age and no distress  Cardio: RRR Pulm: no increased WOB, no wheeze Lochia: appropriate Uterine Fundus: firm Ext: WWP, trace edema LE, +2DP DVT Evaluation: No evidence of DVT seen on physical exam. Negative Homan's sign. No cords or calf tenderness.  H/H: Lab Results  Component Value Date/Time   HGB 7.6* 01/12/2015 01:20 PM   HGB 9.5 10/25/2014   HCT 23.2* 01/12/2015 01:20 PM   HCT 35 07/02/2014    Discharge Diagnoses: Term Pregnancy-delivered, Preelampsia and mild post partum hemorrhage  Discharge Information: Date: 01/13/2015 Activity: pelvic rest Diet: routine  Medications: PNV, Ibuprofen and Iron Breast feeding:  Yes Condition: stable Instructions: refer to handout Discharge to: home       Discharge Instructions    Baby [facility] Nurse Visit    Complete by:  As directed   BP check on Sunday            Medication List    TAKE these medications        ferrous sulfate 325 (65 FE) MG tablet  Take 1 tablet (325 mg total) by mouth 2 (two) times daily with a meal.     ibuprofen 600 MG tablet  Commonly known as:  ADVIL,MOTRIN  Take 1 tablet (600 mg total) by mouth every 6 (six) hours.     prenatal multivitamin Tabs tablet  Take 1 tablet by mouth daily.        Raliegh Ip, DO [facility], PGY-1 01/13/2015,7:47 AM   CNM  attestation I have seen and examined this patient and agree with above documentation in the resident's note.   Jenaya Adinolfi is a 29 y.o. G1P1001 s/p SVD from IOL due to preeclampsia and mag sulfate therapy. Also s/p mild PP hemorrage.   Pain is well controlled.  Plan for birth control is abstinence.  Method of Feeding: bottle  PE:  BP 135/71 mmHg  Pulse 104  Temp(Src) 99.6 F (37.6 C) (Oral)  Resp 18  Ht 5' 3.5" (1.613 m)  Wt 69.854 kg (154 lb)  BMI 26.85 kg/m2  SpO2 99%  Breastfeeding? Unknown Fundus firm   Recent Labs  01/11/15 1001 01/12/15 1320  HGB 9.5* 7.6*  HCT 29.3* 23.2*     Plan: discharge today - postpartum care discussed - f/u clinic in 6 weeks for postpartum visit, with a BP check on Sunday   SHAW, KIMBERLY, CNM 10:13 AM

## 2015-01-14 ENCOUNTER — Inpatient Hospital Stay (HOSPITAL_COMMUNITY)
Admission: AD | Admit: 2015-01-14 | Discharge: 2015-01-16 | DRG: 776 | Disposition: A | Discharge status: Home / Self Care | Payer: Medicaid Other | Source: Ambulatory Visit | Attending: Obstetrics and Gynecology | Admitting: Obstetrics and Gynecology

## 2015-01-14 ENCOUNTER — Encounter (HOSPITAL_COMMUNITY): Payer: Self-pay | Admitting: *Deleted

## 2015-01-14 DIAGNOSIS — O1404 Mild to moderate pre-eclampsia, complicating childbirth: Secondary | ICD-10-CM

## 2015-01-14 DIAGNOSIS — Z8249 Family history of ischemic heart disease and other diseases of the circulatory system: Secondary | ICD-10-CM

## 2015-01-14 DIAGNOSIS — R748 Abnormal levels of other serum enzymes: Secondary | ICD-10-CM | POA: Diagnosis present

## 2015-01-14 DIAGNOSIS — O1495 Unspecified pre-eclampsia, complicating the puerperium: Secondary | ICD-10-CM | POA: Diagnosis present

## 2015-01-14 DIAGNOSIS — Z833 Family history of diabetes mellitus: Secondary | ICD-10-CM

## 2015-01-14 DIAGNOSIS — O9279 Other disorders of lactation: Secondary | ICD-10-CM | POA: Diagnosis present

## 2015-01-14 DIAGNOSIS — N6459 Other signs and symptoms in breast: Secondary | ICD-10-CM

## 2015-01-14 DIAGNOSIS — O9089 Other complications of the puerperium, not elsewhere classified: Principal | ICD-10-CM | POA: Diagnosis present

## 2015-01-14 DIAGNOSIS — I158 Other secondary hypertension: Secondary | ICD-10-CM | POA: Diagnosis present

## 2015-01-14 LAB — CBC WITH DIFFERENTIAL/PLATELET
Basophils Absolute: 0 10*3/uL (ref 0.0–0.1)
Basophils Relative: 0 % (ref 0–1)
EOS ABS: 0.1 10*3/uL (ref 0.0–0.7)
Eosinophils Relative: 1 % (ref 0–5)
HCT: 24.1 % — ABNORMAL LOW (ref 36.0–46.0)
Hemoglobin: 8.2 g/dL — ABNORMAL LOW (ref 12.0–15.0)
Lymphocytes Relative: 18 % (ref 12–46)
Lymphs Abs: 2.3 10*3/uL (ref 0.7–4.0)
MCH: 28.7 pg (ref 26.0–34.0)
MCHC: 34 g/dL (ref 30.0–36.0)
MCV: 84.3 fL (ref 78.0–100.0)
Monocytes Absolute: 1.2 10*3/uL — ABNORMAL HIGH (ref 0.1–1.0)
Monocytes Relative: 9 % (ref 3–12)
Neutro Abs: 9.3 10*3/uL — ABNORMAL HIGH (ref 1.7–7.7)
Neutrophils Relative %: 72 % (ref 43–77)
Platelets: 255 10*3/uL (ref 150–400)
RBC: 2.86 MIL/uL — ABNORMAL LOW (ref 3.87–5.11)
RDW: 14.5 % (ref 11.5–15.5)
WBC: 12.9 10*3/uL — AB (ref 4.0–10.5)

## 2015-01-14 MED ORDER — OXYCODONE-ACETAMINOPHEN 5-325 MG PO TABS
2.0000 | ORAL_TABLET | Freq: Once | ORAL | Status: AC
  Administered 2015-01-14: 2 via ORAL
  Filled 2015-01-14: qty 2

## 2015-01-14 NOTE — Lactation Note (Signed)
Lactation Consultation Note Mom came in with severe engorgement, pain, chills, body aches. Has a bra on but is barely covering breast. Stated the bra fit well when she left the hospital. Mom is bottle/formula feeding baby.  Applied 2 ICE bags, instructed to leave on 10-20 min.  RN gave Ibuprofen for pain and inflamation. Genuine PartsCalled House supervisor for cabbage and ref. Locked.  Instructed mom to get cool cabbage and apply all over breast, when softens discard and reapply. Alternate w/ICE. Do not put breast to warm water in shower or apply heat.  Patient Name: Alyssa Mathis ZOXWR'UToday's Date: 01/14/2015     Maternal Data    Feeding    LATCH Score/Interventions                      Lactation Tools Discussed/Used     Consult Status      [name], Alyssa Mathis 01/14/2015, 11:38 PM

## 2015-01-14 NOTE — MAU Note (Signed)
Pt is post partum 01/11/15. Having severe breast and back pain. Not breast feeding.

## 2015-01-14 NOTE — MAU Provider Note (Signed)
History     CSN: [id number]  Arrival date and time: 01/14/15 2210   First Provider Initiated Contact with Patient 01/14/15 2235      Chief Complaint  Patient presents with  . Breast Pain   HPI [name] is a 29 y.o. G1P1001 who is PPD #3 after SVD with complication of pre-eclampsia who presents to MAU today with complaint of bilateral breast tightness and pain. The patient required PP magnesium for her blood pressures, but was not sent home with medications. She states headache today, but denies blurred vision, floaters or significant LE edema. Blood pressure remain elevated today. Her main complaint, however, is the breast pain. The patient has decided not to breast feed. She denies chest pain, but feels SOB due to pain with inspiration of the breast tissue. She is unsure of fever at home.   OB History    Gravida Para Term Preterm AB TAB SAB Ectopic Multiple Living   0 1      Past Medical History  Diagnosis Date  . Medical history non-contributory   . Anemia   . Hypertension   . Pregnancy induced hypertension     Past Surgical History  Procedure Laterality Date  . No past surgeries      Family History  Problem Relation Age of Onset  . Hypertension Sister   . Hypertension Maternal Grandmother   . Diabetes Maternal Grandfather   . Hypertension Maternal Grandfather     History  Substance Use Topics  . Smoking status: Never Smoker   . Smokeless tobacco: Not on file  . Alcohol Use: No    Allergies: No Known Allergies  Prescriptions prior to admission  Medication Sig Dispense Refill Last Dose  . ferrous sulfate 325 (65 FE) MG tablet Take 1 tablet (325 mg total) by mouth 2 (two) times daily with a meal. 60 tablet 0   . ibuprofen (ADVIL,MOTRIN) 600 MG tablet Take 1 tablet (600 mg total) by mouth every 6 (six) hours. 30 tablet 0   . Prenatal Vit-Fe Fumarate-FA (PRENATAL MULTIVITAMIN) TABS tablet Take 1 tablet by mouth daily.    01/08/2015 at Unknown  time    Review of Systems  Constitutional: Negative for fever and malaise/fatigue.  Eyes: Negative for blurred vision.       Neg - floaters  Cardiovascular: Negative for leg swelling.  Gastrointestinal: Negative for nausea, vomiting, abdominal pain, diarrhea and constipation.  Neurological: Positive for headaches.   Physical Exam   Blood pressure 177/103, pulse 100, temperature 100.1 F (37.8 C), resp. rate 18, SpO2 100 %, not currently breastfeeding.  Physical Exam  Nursing note and vitals reviewed. Constitutional: She is oriented to person, place, and time. She appears well-developed and well-nourished. No distress.  Patient appears very uncomfortable  HENT:  Head: Normocephalic and atraumatic.  Eyes: EOM are normal.  Cardiovascular: Normal rate and intact distal pulses.   Respiratory: Effort normal. Right breast exhibits skin change and tenderness. Right breast exhibits no inverted nipple, no mass and no nipple discharge. Left breast exhibits skin change (skin is taught and firm, warm to touch bilaterally. No focal areas of erythema, warmth or drainage) and tenderness. Left breast exhibits no inverted nipple, no mass and no nipple discharge. Breasts are symmetrical.  GI: Soft.  Musculoskeletal: She exhibits no edema.  Neurological: She is alert and oriented to person, place, and time. She has normal reflexes.  No clonus  Skin: Skin is warm and dry.  No erythema.  Psychiatric: She has a normal mood and affect.   Results for orders placed or performed during the hospital encounter of 01/14/15 (from the past 24 hour(s))  CBC with Differential/Platelet     Status: Abnormal   Collection Time: 01/14/15 10:55 PM  Result Value Ref Range   WBC 12.9 (H) 4.0 - 10.5 K/uL   RBC 2.86 (L) 3.87 - 5.11 MIL/uL   Hemoglobin 8.2 (L) 12.0 - 15.0 g/dL   HCT 91.424.1 (L) 78.236.0 - 95.646.0 %   MCV 84.3 78.0 - 100.0 fL   MCH 28.7 26.0 - 34.0 pg   MCHC 34.0 30.0 - 36.0 g/dL   RDW 21.314.5 08.611.5 - 57.815.5 %    Platelets 255 150 - 400 K/uL   Neutrophils Relative % 72 43 - 77 %   Neutro Abs 9.3 (H) 1.7 - 7.7 K/uL   Lymphocytes Relative 18 12 - 46 %   Lymphs Abs 2.3 0.7 - 4.0 K/uL   Monocytes Relative 9 3 - 12 %   Monocytes Absolute 1.2 (H) 0.1 - 1.0 K/uL   Eosinophils Relative 1 0 - 5 %   Eosinophils Absolute 0.1 0.0 - 0.7 K/uL   Basophils Relative 0 0 - 1 %   Basophils Absolute 0.0 0.0 - 0.1 K/uL  Comprehensive metabolic panel     Status: Abnormal   Collection Time: 01/14/15 10:55 PM  Result Value Ref Range   Sodium 135 135 - 145 mmol/L   Potassium 3.5 3.5 - 5.1 mmol/L   Chloride 102 96 - 112 mmol/L   CO2 24 19 - 32 mmol/L   Glucose, Bld 89 70 - 99 mg/dL   BUN 7 6 - 23 mg/dL   Creatinine, Ser 4.690.78 0.50 - 1.10 mg/dL   Calcium 8.6 8.4 - 62.910.5 mg/dL   Total Protein 6.3 6.0 - 8.3 g/dL   Albumin 2.9 (L) 3.5 - 5.2 g/dL   AST 66 (H) 0 - 37 U/L   ALT 61 (H) 0 - 35 U/L   Alkaline Phosphatase 108 39 - 117 U/L   Total Bilirubin 0.5 0.3 - 1.2 mg/dL   GFR calc non Af Amer >90 >90 mL/min   GFR calc Af Amer >90 >90 mL/min   Anion gap 9 5 - 15  Protein / creatinine ratio, urine     Status: Abnormal   Collection Time: 01/14/15 11:39 PM  Result Value Ref Range   Creatinine, Urine 164.00 mg/dL   Total Protein, Urine 48 mg/dL   Protein Creatinine Ratio 0.29 (H) 0.00 - 0.15 mg/mg[Cre]    Patient Vitals for the past 24 hrs:  BP Temp Pulse Resp SpO2  01/14/15 2323 (!) 177/103 mmHg - 100 18 -  01/14/15 2229 161/93 mmHg 100.1 F (37.8 C) (!) 122 18 100 %    MAU Course  Procedures  MDM Discussed with Dr. Emelda FearFerguson. Consult lactation today. He states patient has option to pump for immediate relief or can have Rx for Percocet and use ice packs and cabbage leaves to dry up milk supply, however this may take up to 5 days. She may also choose to pump/breastfeed and ween over a couple of weeks to avoid engorgement again.   PP pre-eclampsia work-up > CBC, CMP, urine protein/creatinine today 2 Percocet  given in MAU for pain Serial blood pressures, elevation may also be somewhat related to pain  Ice packs applied to the breasts bilaterally Lactation to MAU to consult with patient   2330 - labs pending. Patient  turned over to Venia Carbon, NP  Discussed labs and BP readings with Dr. Emelda Fear who will come to MAU to discuss admission with the patient.   Marny Lowenstein, PA-C  01/14/2015, 11:18 PM  Assessment and Plan  A:  1. Mild preeclampsia delivered   2. Breast engorgement   3. Elevated liver enzymes   4.      PPD #3 s/p SVD  P:  Admit to ICU per Dr. Emelda Fear  Magnesium drip    Duane Lope, NP 01/15/2015 1:04 AM

## 2015-01-15 DIAGNOSIS — Z8249 Family history of ischemic heart disease and other diseases of the circulatory system: Secondary | ICD-10-CM | POA: Diagnosis not present

## 2015-01-15 DIAGNOSIS — O1495 Unspecified pre-eclampsia, complicating the puerperium: Secondary | ICD-10-CM | POA: Diagnosis present

## 2015-01-15 DIAGNOSIS — N644 Mastodynia: Secondary | ICD-10-CM | POA: Diagnosis present

## 2015-01-15 DIAGNOSIS — Z833 Family history of diabetes mellitus: Secondary | ICD-10-CM | POA: Diagnosis not present

## 2015-01-15 DIAGNOSIS — R748 Abnormal levels of other serum enzymes: Secondary | ICD-10-CM | POA: Diagnosis present

## 2015-01-15 DIAGNOSIS — O9089 Other complications of the puerperium, not elsewhere classified: Secondary | ICD-10-CM | POA: Diagnosis not present

## 2015-01-15 DIAGNOSIS — O9279 Other disorders of lactation: Secondary | ICD-10-CM | POA: Diagnosis present

## 2015-01-15 DIAGNOSIS — I158 Other secondary hypertension: Secondary | ICD-10-CM | POA: Diagnosis not present

## 2015-01-15 LAB — COMPREHENSIVE METABOLIC PANEL
ALBUMIN: 2.9 g/dL — AB (ref 3.5–5.2)
ALK PHOS: 108 U/L (ref 39–117)
ALT: 61 U/L — ABNORMAL HIGH (ref 0–35)
ANION GAP: 9 (ref 5–15)
AST: 66 U/L — ABNORMAL HIGH (ref 0–37)
BUN: 7 mg/dL (ref 6–23)
CO2: 24 mmol/L (ref 19–32)
CREATININE: 0.78 mg/dL (ref 0.50–1.10)
Calcium: 8.6 mg/dL (ref 8.4–10.5)
Chloride: 102 mmol/L (ref 96–112)
GFR calc Af Amer: >90 mL/min (ref >90)
Glucose, Bld: 89 mg/dL (ref 70–99)
POTASSIUM: 3.5 mmol/L (ref 3.5–5.1)
SODIUM: 135 mmol/L (ref 135–145)
Total Bilirubin: 0.5 mg/dL (ref 0.3–1.2)
Total Protein: 6.3 g/dL (ref 6.0–8.3)

## 2015-01-15 LAB — PROTEIN / CREATININE RATIO, URINE
Creatinine, Urine: 164 mg/dL
PROTEIN CREATININE RATIO: 0.29 mg/mg{creat} — AB (ref 0.00–0.15)
TOTAL PROTEIN, URINE: 48 mg/dL

## 2015-01-15 LAB — MRSA PCR SCREENING: MRSA by PCR: NEGATIVE

## 2015-01-15 MED ORDER — FUROSEMIDE 20 MG PO TABS
20.0000 mg | ORAL_TABLET | Freq: Two times a day (BID) | ORAL | Status: DC
  Administered 2015-01-15 (×3): 20 mg via ORAL
  Filled 2015-01-15 (×4): qty 1

## 2015-01-15 MED ORDER — LACTATED RINGERS IV SOLN
INTRAVENOUS | Status: DC
Start: 2015-01-15 — End: 2015-01-16
  Administered 2015-01-15 (×2): via INTRAVENOUS

## 2015-01-15 MED ORDER — ASPIRIN 300 MG RE SUPP: 300.0000 mg | RECTAL | Status: DC

## 2015-01-15 MED ORDER — MAGNESIUM SULFATE BOLUS VIA INFUSION
4.0000 g | Freq: Once | INTRAVENOUS | Status: AC
Start: 2015-01-15 — End: 2015-01-15
  Administered 2015-01-15: 4 g via INTRAVENOUS
  Filled 2015-01-15: qty 500

## 2015-01-15 MED ORDER — FERROUS SULFATE 325 (65 FE) MG PO TABS
325.0000 mg | ORAL_TABLET | Freq: Every day | ORAL | Status: DC
  Administered 2015-01-15 – 2015-01-16 (×3): 325 mg via ORAL
  Filled 2015-01-15 (×2): qty 1

## 2015-01-15 MED ORDER — IBUPROFEN 600 MG PO TABS
600.0000 mg | ORAL_TABLET | Freq: Four times a day (QID) | ORAL | Status: DC | PRN
  Administered 2015-01-15: 600 mg via ORAL
  Filled 2015-01-15 (×2): qty 1

## 2015-01-15 MED ORDER — MAGNESIUM SULFATE 50 % IJ SOLN
2.0000 g/h | INTRAVENOUS | Status: DC
  Administered 2015-01-15 (×3): 2 g/h via INTRAVENOUS
  Filled 2015-01-15 (×2): qty 80

## 2015-01-15 MED ORDER — OXYCODONE-ACETAMINOPHEN 5-325 MG PO TABS: 1.0000 | ORAL_TABLET | Freq: Four times a day (QID) | ORAL | Status: DC | PRN

## 2015-01-15 MED ORDER — DOCUSATE SODIUM 100 MG PO CAPS
100.0000 mg | ORAL_CAPSULE | Freq: Two times a day (BID) | ORAL | Status: DC | PRN
  Administered 2015-01-15: 100 mg via ORAL
  Filled 2015-01-15: qty 1

## 2015-01-15 MED ORDER — ASPIRIN 81 MG PO CHEW: 324.0000 mg | CHEWABLE_TABLET | ORAL | Status: DC

## 2015-01-15 MED ORDER — MAGNESIUM SULFATE 50 % IJ SOLN
2.0000 g/h | INTRAVENOUS | Status: DC
  Filled 2015-01-15: qty 80

## 2015-01-15 NOTE — MAU Note (Signed)
Lactation nurse at bedside applied ice packs and counsled pt on what to do to help engorgement.

## 2015-01-15 NOTE — H&P (Signed)
Duane Lope, NP Nurse Practitioner Attested Obstetrics MAU Provider Note 01/14/2015 10:35 PM    Attestation signed by Tilda Burrow, MD at 01/15/2015 1:23 AM  `````Attestation of Attending Supervision of Advanced Practitioner: Evaluation and management procedures were performed by the PA/NP/CNM/OB Fellow under my supervision/collaboration. Chart reviewed and agree with management and plan.I have examined the patient , and exam is notable for 4+reflexes without clonus, trace edema, and History positive for headache of 1 day duration, in addition to the severe breast engorgement.  Labs reviewed, and pt is now exhibiting 2x normal LFT, and these were normal when in the hospital. Will admit for 24 hours Magnesium, plus diuretic. Pt arranging for support personnel to assist her.  Eagle Pitta V 01/15/2015 1:20 AM      Expand All Collapse All    History     CSN: [id number]  Arrival date and time: 01/14/15 2210  First Provider Initiated Contact with Patient 01/14/15 2235    Chief Complaint  Patient presents with  . Breast Pain   HPI [name] is a 29 y.o. G1P1001 who is PPD #3 after SVD with complication of pre-eclampsia who presents to MAU today with complaint of bilateral breast tightness and pain. The patient required PP magnesium for her blood pressures, but was not sent home with medications. She states headache today, but denies blurred vision, floaters or significant LE edema. Blood pressure remain elevated today. Her main complaint, however, is the breast pain. The patient has decided not to breast feed. She denies chest pain, but feels SOB due to pain with inspiration of the breast tissue. She is unsure of fever at home.   OB History    Gravida Para Term Preterm AB TAB SAB Ectopic Multiple Living   0 1      Past Medical History  Diagnosis Date  . Medical history non-contributory   . Anemia   .  Hypertension   . Pregnancy induced hypertension     Past Surgical History  Procedure Laterality Date  . No past surgeries      Family History  Problem Relation Age of Onset  . Hypertension Sister   . Hypertension Maternal Grandmother   . Diabetes Maternal Grandfather   . Hypertension Maternal Grandfather     History  Substance Use Topics  . Smoking status: Never Smoker   . Smokeless tobacco: Not on file  . Alcohol Use: No    Allergies: No Known Allergies  Prescriptions prior to admission  Medication Sig Dispense Refill Last Dose  . ferrous sulfate 325 (65 FE) MG tablet Take 1 tablet (325 mg total) by mouth 2 (two) times daily with a meal. 60 tablet 0   . ibuprofen (ADVIL,MOTRIN) 600 MG tablet Take 1 tablet (600 mg total) by mouth every 6 (six) hours. 30 tablet 0   . Prenatal Vit-Fe Fumarate-FA (PRENATAL MULTIVITAMIN) TABS tablet Take 1 tablet by mouth daily.    01/08/2015 at Unknown time    Review of Systems  Constitutional: Negative for fever and malaise/fatigue.  Eyes: Negative for blurred vision.   Neg - floaters  Cardiovascular: Negative for leg swelling.  Gastrointestinal: Negative for nausea, vomiting, abdominal pain, diarrhea and constipation.  Neurological: Positive for headaches.   Physical Exam   Blood pressure 177/103, pulse 100, temperature 100.1 F (37.8 C), resp. rate 18, SpO2 100 %, not currently breastfeeding.  Physical Exam  Nursing note and vitals reviewed. Constitutional: She is oriented to  person, place, and time. She appears well-developed and well-nourished. No distress.  Patient appears very uncomfortable  HENT:  Head: Normocephalic and atraumatic.  Eyes: EOM are normal.  Cardiovascular: Normal rate and intact distal pulses.  Respiratory: Effort normal. Right breast exhibits skin change and tenderness. Right breast exhibits no inverted nipple, no mass and no  nipple discharge. Left breast exhibits skin change (skin is taught and firm, warm to touch bilaterally. No focal areas of erythema, warmth or drainage) and tenderness. Left breast exhibits no inverted nipple, no mass and no nipple discharge. Breasts are symmetrical.  GI: Soft.  Musculoskeletal: She exhibits no edema.  Neurological: She is alert and oriented to person, place, and time. She has normal reflexes.  No clonus  Skin: Skin is warm and dry. No erythema.  Psychiatric: She has a normal mood and affect.    Lab Results Last 24 Hours    Results for orders placed or performed during the hospital encounter of 01/14/15 (from the past 24 hour(s))  CBC with Differential/Platelet Status: Abnormal   Collection Time: 01/14/15 10:55 PM  Result Value Ref Range   WBC 12.9 (H) 4.0 - 10.5 K/uL   RBC 2.86 (L) 3.87 - 5.11 MIL/uL   Hemoglobin 8.2 (L) 12.0 - 15.0 g/dL   HCT 16.124.1 (L) 09.636.0 - 04.546.0 %   MCV 84.3 78.0 - 100.0 fL   MCH 28.7 26.0 - 34.0 pg   MCHC 34.0 30.0 - 36.0 g/dL   RDW 40.914.5 81.111.5 - 91.415.5 %   Platelets 255 150 - 400 K/uL   Neutrophils Relative % 72 43 - 77 %   Neutro Abs 9.3 (H) 1.7 - 7.7 K/uL   Lymphocytes Relative 18 12 - 46 %   Lymphs Abs 2.3 0.7 - 4.0 K/uL   Monocytes Relative 9 3 - 12 %   Monocytes Absolute 1.2 (H) 0.1 - 1.0 K/uL   Eosinophils Relative 1 0 - 5 %   Eosinophils Absolute 0.1 0.0 - 0.7 K/uL   Basophils Relative 0 0 - 1 %   Basophils Absolute 0.0 0.0 - 0.1 K/uL  Comprehensive metabolic panel Status: Abnormal   Collection Time: 01/14/15 10:55 PM  Result Value Ref Range   Sodium 135 135 - 145 mmol/L   Potassium 3.5 3.5 - 5.1 mmol/L   Chloride 102 96 - 112 mmol/L   CO2 24 19 - 32 mmol/L   Glucose, Bld 89 70 - 99 mg/dL   BUN 7 6 - 23 mg/dL   Creatinine, Ser 7.820.78 0.50 - 1.10 mg/dL   Calcium 8.6 8.4 - 95.610.5 mg/dL   Total Protein 6.3 6.0  - 8.3 g/dL   Albumin 2.9 (L) 3.5 - 5.2 g/dL   AST 66 (H) 0 - 37 U/L   ALT 61 (H) 0 - 35 U/L   Alkaline Phosphatase 108 39 - 117 U/L   Total Bilirubin 0.5 0.3 - 1.2 mg/dL   GFR calc non Af Amer >90 >90 mL/min   GFR calc Af Amer >90 >90 mL/min   Anion gap 9 5 - 15  Protein / creatinine ratio, urine Status: Abnormal   Collection Time: 01/14/15 11:39 PM  Result Value Ref Range   Creatinine, Urine 164.00 mg/dL   Total Protein, Urine 48 mg/dL   Protein Creatinine Ratio 0.29 (H) 0.00 - 0.15 mg/mg[Cre]      Patient Vitals for the past 24 hrs:  BP Temp Pulse Resp SpO2  01/14/15 2323 (!) 177/103 mmHg - 100 18 -  01/14/15  2229 161/93 mmHg 100.1 F (37.8 C) (!) 122 18 100 %    MAU Course  Procedures  MDM Discussed with Dr. Emelda Fear. Consult lactation today. He states patient has option to pump for immediate relief or can have Rx for Percocet and use ice packs and cabbage leaves to dry up milk supply, however this may take up to 5 days. She may also choose to pump/breastfeed and ween over a couple of weeks to avoid engorgement again.   PP pre-eclampsia work-up > CBC, CMP, urine protein/creatinine today 2 Percocet given in MAU for pain Serial blood pressures, elevation may also be somewhat related to pain  Ice packs applied to the breasts bilaterally Lactation to MAU to consult with patient   2330 - labs pending. Patient turned over to Venia Carbon, NP  Discussed labs and BP readings with Dr. Emelda Fear who will come to MAU to discuss admission with the patient.   Marny Lowenstein, PA-C  01/14/2015, 11:18 PM  Assessment and Plan  A:  1. Mild preeclampsia delivered   2. Breast engorgement   3. Elevated liver enzymes   4. PPD #3 s/p SVD  P:  Admit to ICU per Dr. Emelda Fear  Magnesium drip    Duane Lope, NP 01/15/2015 1:04 AM

## 2015-01-15 NOTE — Progress Notes (Signed)
Pt was informed that she must have someone in the room with her to care for the baby while she is on Magnesium gtt. Pt's friend is presently assisting her and Clinical research associatewriter overheard the friend suggesting that she takes the baby home but patient refused her friends help. She state that she will try to remember what she did the last time she was here with the baby. AC was informed of same.

## 2015-01-15 NOTE — Progress Notes (Signed)
   01/15/15 0808  Vitals  Temp 99.4 F (37.4 C)  Temp Source Oral  BP (!) 141/102 mmHg  BP Location Right Arm  BP Method Automatic  Pulse Rate (!) 132  Pulse Rate Source Monitor  Resp 18  Oxygen Therapy  SpO2 98 %  O2 Device Room Air  sitting up feeding baby

## 2015-01-16 ENCOUNTER — Encounter (HOSPITAL_COMMUNITY): Payer: Self-pay | Admitting: *Deleted

## 2015-01-16 LAB — COMPREHENSIVE METABOLIC PANEL
ALT: 50 U/L (ref 14–54)
AST: 36 U/L (ref 15–41)
Albumin: 2.6 g/dL — ABNORMAL LOW (ref 3.5–5.0)
Alkaline Phosphatase: 102 U/L (ref 38–126)
Anion gap: 5 (ref 5–15)
BUN: 7 mg/dL (ref 6–20)
CALCIUM: 6.8 mg/dL — AB (ref 8.9–10.3)
CO2: 28 mmol/L (ref 22–32)
CREATININE: 0.72 mg/dL (ref 0.44–1.00)
Chloride: 106 mmol/L (ref 101–111)
GFR calc Af Amer: >60 mL/min (ref >60)
Glucose, Bld: 114 mg/dL — ABNORMAL HIGH (ref 70–99)
Potassium: 3.6 mmol/L (ref 3.5–5.1)
Sodium: 139 mmol/L (ref 135–145)
Total Bilirubin: 0.3 mg/dL (ref 0.3–1.2)
Total Protein: 5.9 g/dL — ABNORMAL LOW (ref 6.5–8.1)

## 2015-01-16 LAB — CBC
HCT: 23.9 % — ABNORMAL LOW (ref 36.0–46.0)
HEMOGLOBIN: 7.7 g/dL — AB (ref 12.0–15.0)
MCH: 27.8 pg (ref 26.0–34.0)
MCHC: 32.2 g/dL (ref 30.0–36.0)
MCV: 86.3 fL (ref 78.0–100.0)
PLATELETS: 271 10*3/uL (ref 150–400)
RBC: 2.77 MIL/uL — ABNORMAL LOW (ref 3.87–5.11)
RDW: 14.8 % (ref 11.5–15.5)
WBC: 9 10*3/uL (ref 4.0–10.5)

## 2015-01-16 MED ORDER — FERROUS SULFATE 325 (65 FE) MG PO TABS: 325.0000 mg | ORAL_TABLET | Freq: Two times a day (BID) | ORAL | Status: DC

## 2015-01-16 MED ORDER — HYDROCHLOROTHIAZIDE 25 MG PO TABS
25.0000 mg | ORAL_TABLET | Freq: Every day | ORAL | Status: DC
  Administered 2015-01-16: 25 mg via ORAL
  Filled 2015-01-16: qty 1

## 2015-01-16 MED ORDER — IBUPROFEN 600 MG PO TABS: 600.0000 mg | ORAL_TABLET | Freq: Four times a day (QID) | ORAL | Status: DC | PRN

## 2015-01-16 MED ORDER — HYDROCHLOROTHIAZIDE 25 MG PO TABS: 25.0000 mg | ORAL_TABLET | Freq: Every day | ORAL | Status: DC

## 2015-01-16 NOTE — Progress Notes (Signed)

## 2015-01-16 NOTE — Discharge Summary (Signed)
Physician Discharge Summary  Patient ID: Alyssa Mathis MRN: [id number] DOB/AGE: 03/17/1986 29 y.o.  Admit date: 01/14/2015 Discharge date: 01/16/2015  Admission Diagnoses: postpartum preeclampsia  Discharge Diagnoses:  Active Problems:   Preeclampsia in postpartum period   Discharged Condition: good  Hospital Course: Patient re-admitted on PPD#4 with postpartum preeclampsia. She was re-started on magnesium sulfate for a 24 hour period and received one dose of lasix. Her BP became better controlled and she denied any headaches, visual disturbances, RUQ/epigastric pains. She was stable to be discharged on HCTZ 25 mg daily with plans to have a nurse visit at home this week for BP check. Discharge instructions were reviewed with the patient.  Consults: None  Treatments: magnesium sulfate and lasix x 1  Discharge Exam: Blood pressure 134/93, pulse 92, temperature 99.5 F (37.5 C), temperature source Oral, resp. rate 18, SpO2 99 %, not currently breastfeeding. General appearance: alert, cooperative and no distress Resp: clear to auscultation bilaterally Cardio: regular rate and rhythm GI: soft, non-tender; bowel sounds normal; no masses,  no organomegaly Extremities: Homans sign is negative, no sign of DVT and 2+ DTR  Disposition: 01-Home or Self Care     Medication List    TAKE these medications        ferrous sulfate 325 (65 FE) MG tablet  Take 1 tablet (325 mg total) by mouth 2 (two) times daily with a meal.     hydrochlorothiazide 25 MG tablet  Commonly known as:  HYDRODIURIL  Take 1 tablet (25 mg total) by mouth daily.     ibuprofen 600 MG tablet  Commonly known as:  ADVIL,MOTRIN  Take 1 tablet (600 mg total) by mouth every 6 (six) hours as needed for moderate pain.     prenatal multivitamin Tabs tablet  Take 2 tablets by mouth daily.           Follow-up Information    Follow up On 02/18/2015.   Why:  As scheduled       Signed: Khamarion Bjelland 01/16/2015, 9:57  AM

## 2015-01-17 NOTE — Progress Notes (Signed)
UR chart review completed.  

## 2015-02-18 ENCOUNTER — Ambulatory Visit: Payer: Medicaid Other | Admitting: Obstetrics & Gynecology

## 2016-07-18 ENCOUNTER — Other Ambulatory Visit: Payer: Self-pay | Admitting: Obstetrics and Gynecology

## 2017-09-17 NOTE — L&D Delivery Note (Addendum)
Delivery Note  Alyssa Mathis is a 32 yo G5 now P2123 s/p SVD at 8191w0d after IOL for worsening cHTN/development of severe preE based on Bps. She was initially admitted at 3 cm, started on pitocin, and AROM about 1 hour prior to delivery with rapid progression to complete.   At 4:26 PM a viable female was delivered via Vaginal, Spontaneous (Presentation: LOA; nuchal cord delivered through).  APGAR: , ; weight  pending.   After delivery attention was turned to active management of the third stage of labor with gentle cord traction via Brandt maneuver.  Placenta status: delivered spontaneously, intact, 3-vessel cord.  Placenta to pathology due to dark red blood at time of delivery concerning fo rpossible marginal abruption.   Anesthesia:  epidural Episiotomy: None Lacerations: None Suture Repair: n/a Est. Blood Loss (mL):  200 mL  Mom to postpartum.  Baby to NICU.  Alyssa Mathis 03/03/2018, 4:47 PM  I confirm that I have verified the information documented in the resident's note and that I have also personally reperformed the physical exam and all medical decision making activities.  I was gloved and present for entire delivery SVD without incident No difficulty with shoulders No lacerations  NICU team noted left facial palsy on baby.  Seen only when he cried.  Droop to left side of lower face They will have Neonatologist evaluate infant Parents notified by NP who attended  Aviva SignsMarie L Jahdai Mathis, CNM  Please schedule this patient for Postpartum visit in: 1 week with the following provider: Any provider For C/S patients schedule nurse incision check in weeks 2 weeks: no High risk pregnancy complicated by: HTN Delivery mode:  SVD Anticipated Birth Control:  other/unsure PP Procedures needed: BP check  Schedule Integrated BH visit: no

## 2018-03-01 ENCOUNTER — Inpatient Hospital Stay (HOSPITAL_COMMUNITY)
Admission: AD | Admit: 2018-03-01 | Discharge: 2018-03-05 | DRG: 806 | Disposition: A | Discharge status: Home / Self Care | Payer: Medicaid Other | Attending: Obstetrics and Gynecology | Admitting: Obstetrics and Gynecology

## 2018-03-01 ENCOUNTER — Encounter (HOSPITAL_COMMUNITY): Payer: Self-pay | Admitting: *Deleted

## 2018-03-01 ENCOUNTER — Other Ambulatory Visit: Payer: Self-pay

## 2018-03-01 DIAGNOSIS — O119 Pre-existing hypertension with pre-eclampsia, unspecified trimester: Secondary | ICD-10-CM | POA: Diagnosis present

## 2018-03-01 DIAGNOSIS — O4103X0 Oligohydramnios, third trimester, not applicable or unspecified: Secondary | ICD-10-CM | POA: Diagnosis present

## 2018-03-01 DIAGNOSIS — D649 Anemia, unspecified: Secondary | ICD-10-CM | POA: Diagnosis present

## 2018-03-01 DIAGNOSIS — O10919 Unspecified pre-existing hypertension complicating pregnancy, unspecified trimester: Secondary | ICD-10-CM

## 2018-03-01 DIAGNOSIS — Z3A33 33 weeks gestation of pregnancy: Secondary | ICD-10-CM

## 2018-03-01 DIAGNOSIS — R51 Headache: Secondary | ICD-10-CM | POA: Diagnosis present

## 2018-03-01 DIAGNOSIS — O9902 Anemia complicating childbirth: Secondary | ICD-10-CM | POA: Diagnosis present

## 2018-03-01 DIAGNOSIS — O114 Pre-existing hypertension with pre-eclampsia, complicating childbirth: Secondary | ICD-10-CM | POA: Diagnosis present

## 2018-03-01 DIAGNOSIS — O113 Pre-existing hypertension with pre-eclampsia, third trimester: Secondary | ICD-10-CM

## 2018-03-01 DIAGNOSIS — Z91199 Patient's noncompliance with other medical treatment and regimen due to unspecified reason: Secondary | ICD-10-CM

## 2018-03-01 DIAGNOSIS — O1002 Pre-existing essential hypertension complicating childbirth: Secondary | ICD-10-CM | POA: Diagnosis present

## 2018-03-01 DIAGNOSIS — I10 Essential (primary) hypertension: Secondary | ICD-10-CM

## 2018-03-01 DIAGNOSIS — O133 Gestational [pregnancy-induced] hypertension without significant proteinuria, third trimester: Secondary | ICD-10-CM

## 2018-03-01 DIAGNOSIS — Z9119 Patient's noncompliance with other medical treatment and regimen: Secondary | ICD-10-CM

## 2018-03-01 LAB — COMPREHENSIVE METABOLIC PANEL
ALT: 16 U/L (ref 14–54)
ANION GAP: 10 (ref 5–15)
AST: 24 U/L (ref 15–41)
Albumin: 3.2 g/dL — ABNORMAL LOW (ref 3.5–5.0)
Alkaline Phosphatase: 82 U/L (ref 38–126)
BUN: 14 mg/dL (ref 6–20)
CO2: 20 mmol/L — ABNORMAL LOW (ref 22–32)
Calcium: 9 mg/dL (ref 8.9–10.3)
Chloride: 106 mmol/L (ref 101–111)
Creatinine, Ser: 0.84 mg/dL (ref 0.44–1.00)
GFR calc non Af Amer: >60 mL/min (ref >60)
Glucose, Bld: 88 mg/dL (ref 65–99)
POTASSIUM: 4.2 mmol/L (ref 3.5–5.1)
SODIUM: 136 mmol/L (ref 135–145)
TOTAL PROTEIN: 7.1 g/dL (ref 6.5–8.1)
Total Bilirubin: 0.6 mg/dL (ref 0.3–1.2)

## 2018-03-01 LAB — PROTEIN / CREATININE RATIO, URINE
Creatinine, Urine: 309 mg/dL
PROTEIN CREATININE RATIO: 0.11 mg/mg{creat} (ref 0.00–0.15)
TOTAL PROTEIN, URINE: 35 mg/dL

## 2018-03-01 LAB — CBC WITH DIFFERENTIAL/PLATELET
BASOS PCT: 0 %
Basophils Absolute: 0 10*3/uL (ref 0.0–0.1)
EOS ABS: 0.1 10*3/uL (ref 0.0–0.7)
Eosinophils Relative: 1 %
HCT: 33.6 % — ABNORMAL LOW (ref 36.0–46.0)
HEMOGLOBIN: 11.2 g/dL — AB (ref 12.0–15.0)
Lymphocytes Relative: 28 %
Lymphs Abs: 2.3 10*3/uL (ref 0.7–4.0)
MCH: 30.1 pg (ref 26.0–34.0)
MCHC: 33.3 g/dL (ref 30.0–36.0)
MCV: 90.3 fL (ref 78.0–100.0)
MONOS PCT: 6 %
Monocytes Absolute: 0.5 10*3/uL (ref 0.1–1.0)
NEUTROS PCT: 65 %
Neutro Abs: 5.5 10*3/uL (ref 1.7–7.7)
Platelets: 199 10*3/uL (ref 150–400)
RBC: 3.72 MIL/uL — ABNORMAL LOW (ref 3.87–5.11)
RDW: 12.7 % (ref 11.5–15.5)
WBC: 8.4 10*3/uL (ref 4.0–10.5)

## 2018-03-01 MED ORDER — CALCIUM CARBONATE ANTACID 500 MG PO CHEW: 2.0000 | CHEWABLE_TABLET | ORAL | Status: DC | PRN

## 2018-03-01 MED ORDER — LABETALOL HCL 5 MG/ML IV SOLN
INTRAVENOUS | Status: AC
  Filled 2018-03-01: qty 4

## 2018-03-01 MED ORDER — LACTATED RINGERS IV SOLN
INTRAVENOUS | Status: DC
  Administered 2018-03-01 – 2018-03-02 (×2): via INTRAVENOUS

## 2018-03-01 MED ORDER — PROMETHAZINE HCL 25 MG PO TABS: 25.0000 mg | ORAL_TABLET | Freq: Four times a day (QID) | ORAL | Status: DC | PRN

## 2018-03-01 MED ORDER — LABETALOL HCL 100 MG PO TABS
200.0000 mg | ORAL_TABLET | Freq: Once | ORAL | Status: AC
  Administered 2018-03-01: 200 mg via ORAL
  Filled 2018-03-01: qty 2

## 2018-03-01 MED ORDER — HYDRALAZINE HCL 20 MG/ML IJ SOLN
10.0000 mg | Freq: Once | INTRAMUSCULAR | Status: AC
  Administered 2018-03-01: 10 mg via INTRAMUSCULAR

## 2018-03-01 MED ORDER — HYDRALAZINE HCL 20 MG/ML IJ SOLN
INTRAMUSCULAR | Status: AC
  Administered 2018-03-01: 10 mg via INTRAVENOUS
  Filled 2018-03-01: qty 1

## 2018-03-01 MED ORDER — BUTALBITAL-APAP-CAFFEINE 50-325-40 MG PO TABS
1.0000 | ORAL_TABLET | ORAL | Status: DC | PRN
  Administered 2018-03-01: 1 via ORAL
  Filled 2018-03-01: qty 1

## 2018-03-01 MED ORDER — LABETALOL HCL 5 MG/ML IV SOLN
20.0000 mg | INTRAVENOUS | Status: AC | PRN
  Administered 2018-03-01: 20 mg via INTRAVENOUS
  Administered 2018-03-01: 40 mg via INTRAVENOUS
  Administered 2018-03-03: 80 mg via INTRAVENOUS
  Filled 2018-03-01: qty 16
  Filled 2018-03-01: qty 8

## 2018-03-01 MED ORDER — HYDRALAZINE HCL 20 MG/ML IJ SOLN
10.0000 mg | Freq: Once | INTRAMUSCULAR | Status: AC | PRN
  Administered 2018-03-03: 10 mg via INTRAVENOUS
  Filled 2018-03-01: qty 1

## 2018-03-01 MED ORDER — ZOLPIDEM TARTRATE 5 MG PO TABS: 5.0000 mg | ORAL_TABLET | Freq: Every evening | ORAL | Status: DC | PRN

## 2018-03-01 MED ORDER — BETAMETHASONE SOD PHOS & ACET 6 (3-3) MG/ML IJ SUSP
12.0000 mg | INTRAMUSCULAR | Status: AC
Start: 2018-03-01 — End: 2018-03-02
  Administered 2018-03-01 – 2018-03-02 (×2): 12 mg via INTRAMUSCULAR
  Filled 2018-03-01 (×2): qty 2

## 2018-03-01 MED ORDER — DOCUSATE SODIUM 100 MG PO CAPS
100.0000 mg | ORAL_CAPSULE | Freq: Every day | ORAL | Status: DC
  Filled 2018-03-01 (×3): qty 1

## 2018-03-01 MED ORDER — ACETAMINOPHEN 325 MG PO TABS
650.0000 mg | ORAL_TABLET | ORAL | Status: DC | PRN
  Administered 2018-03-01: 650 mg via ORAL
  Filled 2018-03-01: qty 2

## 2018-03-01 MED ORDER — PROMETHAZINE HCL 25 MG/ML IJ SOLN
25.0000 mg | Freq: Four times a day (QID) | INTRAMUSCULAR | Status: DC | PRN
  Administered 2018-03-01: 25 mg via INTRAVENOUS
  Filled 2018-03-01: qty 1

## 2018-03-01 MED ORDER — PRENATAL MULTIVITAMIN CH
1.0000 | ORAL_TABLET | Freq: Every day | ORAL | Status: DC
  Administered 2018-03-02: 1 via ORAL
  Filled 2018-03-01 (×2): qty 1

## 2018-03-01 MED ORDER — LABETALOL HCL 200 MG PO TABS
300.0000 mg | ORAL_TABLET | Freq: Two times a day (BID) | ORAL | Status: DC
  Administered 2018-03-01 – 2018-03-02 (×2): 300 mg via ORAL
  Filled 2018-03-01 (×2): qty 1

## 2018-03-01 MED ORDER — HYDRALAZINE HCL 20 MG/ML IJ SOLN
10.0000 mg | Freq: Once | INTRAMUSCULAR | Status: AC
  Administered 2018-03-01: 10 mg via INTRAVENOUS

## 2018-03-01 MED ORDER — LABETALOL HCL 5 MG/ML IV SOLN: 20.0000 mg | Freq: Once | INTRAVENOUS | Status: DC

## 2018-03-01 NOTE — MAU Provider Note (Signed)
History   [id number] @ 33.5 wks in with c/o severe headache for several days.[facility] and is on labetalol 200 BID and was told by physician there they have done all they can do for her hypertension in pregnancy. Pt states she is not hypertensive when she is not pregnant. Has hx of pre eclampsia with other two pregnancies.   CSN: [id number]  Arrival date & time 03/01/18  1240   None     Chief Complaint  Patient presents with  . Hypertension  . Abdominal Pain    HPI  Past Medical History:  Diagnosis Date  . Anemia   . Hypertension   . Medical history non-contributory   . Pregnancy induced hypertension     Past Surgical History:  Procedure Laterality Date  . NO PAST SURGERIES      Family History  Problem Relation Age of Onset  . Hypertension Sister   . Hypertension Maternal Grandmother   . Diabetes Maternal Grandfather   . Hypertension Maternal Grandfather     Social History   Tobacco Use  . Smoking status: Never Smoker  Substance Use Topics  . Alcohol use: No  . Drug use: No    OB History    Gravida  1   Para  1   Term  1   Preterm      AB      Living  1     SAB      TAB      Ectopic      Multiple  0   Live Births  1           Review of Systems  Constitutional: Negative.   HENT: Negative.   Eyes: Negative.   Respiratory: Negative.   Cardiovascular: Negative.   Gastrointestinal: Negative.   Endocrine: Negative.   Genitourinary: Negative.   Musculoskeletal: Negative.   Skin: Negative.   Allergic/Immunologic: Negative.   Neurological: Positive for headaches.  Hematological: Negative.   Psychiatric/Behavioral: Negative.     Allergies  Patient has no known allergies.  Home Medications    There were no vitals taken for this visit.  Physical Exam  Constitutional: She is oriented to person, place, and time. She appears well-developed and well-nourished.  HENT:  Head: Normocephalic.  Cardiovascular: Normal rate,  regular rhythm, normal heart sounds and intact distal pulses.  Pulmonary/Chest: Effort normal and breath sounds normal.  Abdominal: Normal appearance and bowel sounds are normal.  Neurological: She is alert and oriented to person, place, and time.  Skin: Skin is warm and dry.  Psychiatric: She has a normal mood and affect. Her behavior is normal.    MAU Course  Procedures (including critical care time)  Labs Reviewed  CBC WITH DIFFERENTIAL/PLATELET  COMPREHENSIVE METABOLIC PANEL  PROTEIN / CREATININE RATIO, URINE   No results found.   1. Pregnancy induced hypertension, third trimester       MDM  BP's severe range, labs drawn. No edema lower extremities. Will admit. POC discussed with Dr. Earlene Plateravis. To admit pt.

## 2018-03-01 NOTE — MAU Note (Signed)
Pt presents to MAU with complaints of HTN, diagnosed at another facility. PT reports headache. Denies any dizziness or blurred vision. [facility]

## 2018-03-01 NOTE — Progress Notes (Signed)
   03/01/18 1622  Vital Signs  BP (!) 169/112 (RN notified)  Dr. Earlene Plateravis notified of above BP.  Orders received.

## 2018-03-01 NOTE — H&P (Addendum)
History   [id number] @ 33.5 wks in with c/o severe headache for several days.[facility] and is on labetalol 200 BID and was told by physician there they have done all they can do for her hypertension in pregnancy. Pt states she is not hypertensive when she is not pregnant. Has hx of pre eclampsia with other two pregnancies.   CSN: [id number]  Arrival date & time 03/01/18  1240   None        Chief Complaint  Patient presents with  . Hypertension  . Abdominal Pain    HPI      Past Medical History:  Diagnosis Date  . Anemia   . Hypertension   . Medical history non-contributory   . Pregnancy induced hypertension          Past Surgical History:  Procedure Laterality Date  . NO PAST SURGERIES           Family History  Problem Relation Age of Onset  . Hypertension Sister   . Hypertension Maternal Grandmother   . Diabetes Maternal Grandfather   . Hypertension Maternal Grandfather     Social History       Tobacco Use  . Smoking status: Never Smoker  Substance Use Topics  . Alcohol use: No  . Drug use: No            OB History    Gravida  1   Para  1   Term  1   Preterm      AB      Living  1     SAB      TAB      Ectopic      Multiple  0   Live Births  1           Review of Systems  Constitutional: Negative.   HENT: Negative.   Eyes: Negative.   Respiratory: Negative.   Cardiovascular: Negative.   Gastrointestinal: Negative.   Endocrine: Negative.   Genitourinary: Negative.   Musculoskeletal: Negative.   Skin: Negative.   Allergic/Immunologic: Negative.   Neurological: Positive for headaches.  Hematological: Negative.   Psychiatric/Behavioral: Negative.     Allergies  Patient has no known allergies.  Home Medications    There were no vitals taken for this visit.  Physical Exam  Constitutional: She is oriented to person, place, and time. She appears well-developed and  well-nourished.  HENT:  Head: Normocephalic.  Cardiovascular: Normal rate, regular rhythm, normal heart sounds and intact distal pulses.  Pulmonary/Chest: Effort normal and breath sounds normal.  Abdominal: Normal appearance and bowel sounds are normal.  Neurological: She is alert and oriented to person, place, and time.  Skin: Skin is warm and dry.  Psychiatric: She has a normal mood and affect. Her behavior is normal.    MAU Course  Procedures (including critical care time)  Labs Reviewed  CBC WITH DIFFERENTIAL/PLATELET  COMPREHENSIVE METABOLIC PANEL  PROTEIN / CREATININE RATIO, URINE   ImagingResults(Last48hours)  No results found.     1. Pregnancy induced hypertension, third trimester       MDM  BP's severe range, labs drawn. No edema lower extremities. Will admit. POC discussed with Dr. Earlene Plateravis. To admit pt.    Faculty Note  Patient admitted for severe range BP in the setting of chronic HTN (per records, patient denies).  Today, patient reports severe headache that only started after she got to MAU. She is having occasional contractions, denies leaking or  bleeding. Reports normal fetal movement.  Patient was admitted to [facility], for severe range BP there yesterday and was to be transferred to [facility] for BP management in the pre-term setting, however she refused transfer to [facility] and signed herself out AMA to come to [facility]. Reports she has been on labetalol 200 mg BID since April and was on Procardia prior to that but was switched from procardia because she had bad headaches with the procardia.   Denies any history of hypertension outside of pregnancy. She does reports pre-eclampsia in her first pregnancy and pre-eclampsia in her second pregnancy, records show she was induced at 37 weeks for PEC without severe features, developed severe features and was put on Magnesium x24 hrs, d/cd home stable then readmitted for second course of mag  on PPD#3. She reports she went home on anti-hypertensives and then was discontinued as the home health RN told her she did not need it.   Review of records demonstrates patient diagnosed with cHTN at 8 weeks and started on labetalol at 12 weeks however there are notations that she has been inconsistent with anti-hypertensive compliance.   It is unknown what anti-hypertensives patient was given while in patient at [facility], she reports she was only given IV medication and was not given her labetalol 200 mg PO this am. She received 10 x 2 IV hydralazine in MAU, then 200 mg PO labetalol on floor and subsequently 20 mg IV labetalol on OBHR unit for 169/112. Systolic improved to 150s however diastolic remains 696 so will give 40 mg IV labetalol now.   Differential is worsening chronic HTN vs superimposed PEC on cHTN, labwork is all normal. If BP not improved on IV labetalol protocol, will start MgSO4 and consider her superimposed PEC on cHTN based on severely elevated BP with plans to get 48 hrs of antenatal corticosteroids in and plan for delivery. She has received ANCS x1 thus far.   I reviewed this with patient and husband, they verbalize understanding although they believe that if she gets rest, her BP will stableize. I reviewed that she is very ill at the moment and needs adequate BP control.    Baldemar Lenis, M.D. Attending Obstetrician & Gynecologist, [facility] for [employer], [facility]

## 2018-03-02 ENCOUNTER — Inpatient Hospital Stay (HOSPITAL_COMMUNITY): Payer: Medicaid Other

## 2018-03-02 ENCOUNTER — Encounter (HOSPITAL_COMMUNITY): Payer: Self-pay

## 2018-03-02 LAB — COMPREHENSIVE METABOLIC PANEL
ALT: 13 U/L — ABNORMAL LOW (ref 14–54)
AST: 21 U/L (ref 15–41)
Albumin: 2.9 g/dL — ABNORMAL LOW (ref 3.5–5.0)
Alkaline Phosphatase: 75 U/L (ref 38–126)
Anion gap: 9 (ref 5–15)
BILIRUBIN TOTAL: 0.5 mg/dL (ref 0.3–1.2)
BUN: 15 mg/dL (ref 6–20)
CO2: 18 mmol/L — ABNORMAL LOW (ref 22–32)
Calcium: 9.2 mg/dL (ref 8.9–10.3)
Chloride: 110 mmol/L (ref 101–111)
Creatinine, Ser: 0.59 mg/dL (ref 0.44–1.00)
GFR calc Af Amer: >60 mL/min (ref >60)
Glucose, Bld: 139 mg/dL — ABNORMAL HIGH (ref 65–99)
POTASSIUM: 3.6 mmol/L (ref 3.5–5.1)
Sodium: 137 mmol/L (ref 135–145)
Total Protein: 6.7 g/dL (ref 6.5–8.1)

## 2018-03-02 LAB — CBC
HEMATOCRIT: 30.6 % — AB (ref 36.0–46.0)
Hemoglobin: 10.3 g/dL — ABNORMAL LOW (ref 12.0–15.0)
MCH: 30.4 pg (ref 26.0–34.0)
MCHC: 33.7 g/dL (ref 30.0–36.0)
MCV: 90.3 fL (ref 78.0–100.0)
Platelets: 190 10*3/uL (ref 150–400)
RBC: 3.39 MIL/uL — ABNORMAL LOW (ref 3.87–5.11)
RDW: 12.8 % (ref 11.5–15.5)
WBC: 11.3 10*3/uL — AB (ref 4.0–10.5)

## 2018-03-02 MED ORDER — ONDANSETRON HCL 4 MG PO TABS: 8.0000 mg | ORAL_TABLET | Freq: Three times a day (TID) | ORAL | Status: DC | PRN

## 2018-03-02 MED ORDER — LABETALOL HCL 200 MG PO TABS
400.0000 mg | ORAL_TABLET | Freq: Two times a day (BID) | ORAL | Status: DC
  Administered 2018-03-02 – 2018-03-03 (×2): 400 mg via ORAL
  Filled 2018-03-02 (×2): qty 2

## 2018-03-02 MED ORDER — OXYCODONE HCL 5 MG PO TABS
5.0000 mg | ORAL_TABLET | Freq: Two times a day (BID) | ORAL | Status: DC | PRN
  Filled 2018-03-02: qty 1

## 2018-03-02 MED ORDER — DOCUSATE SODIUM 100 MG PO CAPS: 100.0000 mg | ORAL_CAPSULE | Freq: Two times a day (BID) | ORAL | Status: DC | PRN

## 2018-03-02 NOTE — Progress Notes (Signed)
To ultrasound via w/c.

## 2018-03-02 NOTE — Progress Notes (Signed)
FACULTY PRACTICE ANTEPARTUM PROGRESS NOTE  Alyssa Mathis is a 32 y.o. [id number] at 137w6d who is admitted for worsening chronic HTN.  Estimated Date of Delivery: 04/14/18 Fetal presentation is cephalic.  Length of Stay:  1 Days. Admitted 03/01/2018  Subjective: Patient reports normal fetal movement.  She denies uterine contractions, denies bleeding and leaking of fluid per vagina. Reports headache from last night has completely gone  Vitals:  Blood pressure (!) 142/100, pulse 92, temperature 98.5 F (36.9 C), temperature source Oral, resp. rate 18, height 5\' 6"  (1.676 m), last menstrual period 07/08/2017, SpO2 95 %, not currently breastfeeding. Physical Examination: CONSTITUTIONAL: Well-developed, well-nourished female in no acute distress.  HENT:  Normocephalic, atraumatic, External right and left ear normal. Oropharynx is clear and moist EYES: Conjunctivae and EOM are normal. Pupils are equal, round, and reactive to light. No scleral icterus.  NECK: Normal range of motion, supple, no masses. SKIN: Skin is warm and dry. No rash noted. Not diaphoretic. No erythema. No pallor. NEUROLGIC: Alert and oriented to person, place, and time. Normal reflexes, muscle tone coordination. No cranial nerve deficit noted. PSYCHIATRIC: Normal mood and affect. Normal behavior. Normal judgment and thought content. CARDIOVASCULAR: Normal heart rate noted, regular rhythm RESPIRATORY: Effort and breath sounds normal, no problems with respiration noted MUSCULOSKELETAL: Normal range of motion. No edema and no tenderness. ABDOMEN: Soft, nontender, nondistended, gravid. CERVIX: deferred  Fetal monitoring: FHR: 130s bpm, Variability: moderate, Accelerations: Present, Decelerations: Absent  Uterine activity: no contractions per hour   No results found.  Current scheduled medications . betamethasone acetate-betamethasone sodium phosphate  12 mg Intramuscular Q24 Hr x 2  . docusate sodium  100 mg Oral Daily  .  labetalol  300 mg Oral BID  . prenatal multivitamin  1 tablet Oral Q1200    I have reviewed the patient's current medications.  ASSESSMENT: Active Problems:   HTN in pregnancy, chronic  Reviewed diagnosis with patient and that this appears to be worsening chronic HTN which may be controlled by titrating BP. Reviewed that if she develops superimposed pre-eclampsia, delivery would be indicated at 34 weeks. She does not want to be delivered that early, I reviewed the guidelines and she is agreeable to repeat bloodwork and monitoring of BP.  PLAN: Repeat labwork this am Cont labetalol 300 mg TID Monitor BP Due for ANCS 2nd dose this afternoon   Continue routine antenatal care.   Baldemar LenisK. Meryl Johnedward Brodrick, M.D. Attending Obstetrician & Gynecologist, [facility] for [employer], [facility]  03/02/2018 7:50 AM

## 2018-03-02 NOTE — Progress Notes (Signed)
Returned from ultrasound.

## 2018-03-03 ENCOUNTER — Encounter (HOSPITAL_COMMUNITY): Payer: Self-pay | Admitting: Anesthesiology

## 2018-03-03 ENCOUNTER — Inpatient Hospital Stay (HOSPITAL_COMMUNITY): Payer: Medicaid Other | Admitting: Anesthesiology

## 2018-03-03 DIAGNOSIS — Z3A33 33 weeks gestation of pregnancy: Secondary | ICD-10-CM

## 2018-03-03 DIAGNOSIS — O4103X0 Oligohydramnios, third trimester, not applicable or unspecified: Secondary | ICD-10-CM | POA: Diagnosis present

## 2018-03-03 DIAGNOSIS — O119 Pre-existing hypertension with pre-eclampsia, unspecified trimester: Secondary | ICD-10-CM

## 2018-03-03 DIAGNOSIS — Z91199 Patient's noncompliance with other medical treatment and regimen due to unspecified reason: Secondary | ICD-10-CM

## 2018-03-03 DIAGNOSIS — Z9119 Patient's noncompliance with other medical treatment and regimen: Secondary | ICD-10-CM

## 2018-03-03 DIAGNOSIS — O114 Pre-existing hypertension with pre-eclampsia, complicating childbirth: Secondary | ICD-10-CM

## 2018-03-03 LAB — CBC
HCT: 30.9 % — ABNORMAL LOW (ref 36.0–46.0)
HEMATOCRIT: 31.6 % — AB (ref 36.0–46.0)
HEMOGLOBIN: 10.3 g/dL — AB (ref 12.0–15.0)
Hemoglobin: 10.6 g/dL — ABNORMAL LOW (ref 12.0–15.0)
MCH: 30.5 pg (ref 26.0–34.0)
MCH: 30.4 pg (ref 26.0–34.0)
MCHC: 33.5 g/dL (ref 30.0–36.0)
MCHC: 33.3 g/dL (ref 30.0–36.0)
MCV: 90.8 fL (ref 78.0–100.0)
MCV: 91.2 fL (ref 78.0–100.0)
Platelets: 191 10*3/uL (ref 150–400)
Platelets: 181 10*3/uL (ref 150–400)
RBC: 3.48 MIL/uL — ABNORMAL LOW (ref 3.87–5.11)
RBC: 3.39 MIL/uL — AB (ref 3.87–5.11)
RDW: 12.9 % (ref 11.5–15.5)
RDW: 12.9 % (ref 11.5–15.5)
WBC: 15.9 10*3/uL — AB (ref 4.0–10.5)
WBC: 14.9 10*3/uL — ABNORMAL HIGH (ref 4.0–10.5)

## 2018-03-03 LAB — COMPREHENSIVE METABOLIC PANEL
ALBUMIN: 3.1 g/dL — AB (ref 3.5–5.0)
ALT: 15 U/L (ref 14–54)
ALT: 15 U/L (ref 14–54)
ANION GAP: 10 (ref 5–15)
ANION GAP: 10 (ref 5–15)
AST: 23 U/L (ref 15–41)
AST: 24 U/L (ref 15–41)
Albumin: 3.1 g/dL — ABNORMAL LOW (ref 3.5–5.0)
Alkaline Phosphatase: 80 U/L (ref 38–126)
Alkaline Phosphatase: 78 U/L (ref 38–126)
BILIRUBIN TOTAL: 0.6 mg/dL (ref 0.3–1.2)
BUN: 11 mg/dL (ref 6–20)
BUN: 13 mg/dL (ref 6–20)
CHLORIDE: 107 mmol/L (ref 101–111)
CHLORIDE: 106 mmol/L (ref 101–111)
CO2: 20 mmol/L — AB (ref 22–32)
CO2: 19 mmol/L — AB (ref 22–32)
Calcium: 9.2 mg/dL (ref 8.9–10.3)
Calcium: 8.5 mg/dL — ABNORMAL LOW (ref 8.9–10.3)
Creatinine, Ser: 0.72 mg/dL (ref 0.44–1.00)
Creatinine, Ser: 0.67 mg/dL (ref 0.44–1.00)
GFR calc Af Amer: >60 mL/min (ref >60)
GFR calc non Af Amer: >60 mL/min (ref >60)
GFR calc non Af Amer: >60 mL/min (ref >60)
GLUCOSE: 99 mg/dL (ref 65–99)
Glucose, Bld: 100 mg/dL — ABNORMAL HIGH (ref 65–99)
Potassium: 4 mmol/L (ref 3.5–5.1)
Potassium: 3.8 mmol/L (ref 3.5–5.1)
SODIUM: 137 mmol/L (ref 135–145)
SODIUM: 135 mmol/L (ref 135–145)
TOTAL PROTEIN: 6.7 g/dL (ref 6.5–8.1)
Total Bilirubin: 0.4 mg/dL (ref 0.3–1.2)
Total Protein: 6.8 g/dL (ref 6.5–8.1)

## 2018-03-03 LAB — TYPE AND SCREEN
ABO/RH(D): O POS
ABO/RH(D): O POS
Antibody Screen: NEGATIVE
Antibody Screen: NEGATIVE

## 2018-03-03 MED ORDER — FENTANYL 2.5 MCG/ML BUPIVACAINE 1/10 % EPIDURAL INFUSION (WH - ANES)
INTRAMUSCULAR | Status: AC
  Filled 2018-03-03: qty 100

## 2018-03-03 MED ORDER — ONDANSETRON HCL 4 MG PO TABS: 4.0000 mg | ORAL_TABLET | ORAL | Status: DC | PRN

## 2018-03-03 MED ORDER — ONDANSETRON HCL 4 MG/2ML IJ SOLN: 4.0000 mg | INTRAMUSCULAR | Status: DC | PRN

## 2018-03-03 MED ORDER — MISOPROSTOL 25 MCG QUARTER TABLET: 25.0000 ug | ORAL_TABLET | ORAL | Status: DC | PRN

## 2018-03-03 MED ORDER — HYDROXYZINE HCL 50 MG PO TABS
50.0000 mg | ORAL_TABLET | Freq: Four times a day (QID) | ORAL | Status: DC | PRN
  Filled 2018-03-03: qty 1

## 2018-03-03 MED ORDER — FENTANYL 2.5 MCG/ML BUPIVACAINE 1/10 % EPIDURAL INFUSION (WH - ANES): 14.0000 mL/h | INTRAMUSCULAR | Status: DC | PRN

## 2018-03-03 MED ORDER — LABETALOL HCL 5 MG/ML IV SOLN: 20.0000 mg | INTRAVENOUS | Status: DC | PRN

## 2018-03-03 MED ORDER — ONDANSETRON HCL 4 MG/2ML IJ SOLN
4.0000 mg | Freq: Four times a day (QID) | INTRAMUSCULAR | Status: DC | PRN
  Administered 2018-03-03: 4 mg via INTRAVENOUS
  Filled 2018-03-03: qty 2

## 2018-03-03 MED ORDER — IBUPROFEN 600 MG PO TABS
600.0000 mg | ORAL_TABLET | Freq: Four times a day (QID) | ORAL | Status: DC
Start: 2018-03-03 — End: 2018-03-05
  Administered 2018-03-04 – 2018-03-05 (×2): 600 mg via ORAL
  Filled 2018-03-03 (×3): qty 1

## 2018-03-03 MED ORDER — SODIUM BICARBONATE 8.4 % IV SOLN
INTRAVENOUS | Status: DC | PRN
  Administered 2018-03-03: 5 mL via EPIDURAL

## 2018-03-03 MED ORDER — OXYTOCIN 40 UNITS IN LACTATED RINGERS INFUSION - SIMPLE MED
1.0000 m[IU]/min | INTRAVENOUS | Status: DC
  Administered 2018-03-03: 2 m[IU]/min via INTRAVENOUS
  Filled 2018-03-03: qty 1000

## 2018-03-03 MED ORDER — WITCH HAZEL-GLYCERIN EX PADS: 1.0000 "application " | MEDICATED_PAD | CUTANEOUS | Status: DC | PRN

## 2018-03-03 MED ORDER — LACTATED RINGERS IV SOLN: 500.0000 mL | INTRAVENOUS | Status: DC | PRN

## 2018-03-03 MED ORDER — DIBUCAINE 1 % RE OINT: 1.0000 "application " | TOPICAL_OINTMENT | RECTAL | Status: DC | PRN

## 2018-03-03 MED ORDER — FLEET ENEMA 7-19 GM/118ML RE ENEM: 1.0000 | ENEMA | Freq: Every day | RECTAL | Status: DC | PRN

## 2018-03-03 MED ORDER — HYDRALAZINE HCL 20 MG/ML IJ SOLN: 5.0000 mg | INTRAMUSCULAR | Status: DC | PRN

## 2018-03-03 MED ORDER — PENICILLIN G POT IN DEXTROSE 60000 UNIT/ML IV SOLN
3.0000 10*6.[IU] | INTRAVENOUS | Status: DC
  Administered 2018-03-03: 3 10*6.[IU] via INTRAVENOUS
  Filled 2018-03-03 (×4): qty 50

## 2018-03-03 MED ORDER — SENNOSIDES-DOCUSATE SODIUM 8.6-50 MG PO TABS
2.0000 | ORAL_TABLET | ORAL | Status: DC
  Filled 2018-03-03: qty 2

## 2018-03-03 MED ORDER — COCONUT OIL OIL: 1.0000 "application " | TOPICAL_OIL | Status: DC | PRN

## 2018-03-03 MED ORDER — ACETAMINOPHEN 325 MG PO TABS: 650.0000 mg | ORAL_TABLET | ORAL | Status: DC | PRN

## 2018-03-03 MED ORDER — PHENYLEPHRINE 40 MCG/ML (10ML) SYRINGE FOR IV PUSH (FOR BLOOD PRESSURE SUPPORT)
80.0000 ug | PREFILLED_SYRINGE | INTRAVENOUS | Status: DC | PRN
  Filled 2018-03-03: qty 5

## 2018-03-03 MED ORDER — LACTATED RINGERS IV SOLN
INTRAVENOUS | Status: DC
  Administered 2018-03-03: 10:00:00 via INTRAVENOUS

## 2018-03-03 MED ORDER — LIDOCAINE HCL (PF) 1 % IJ SOLN
30.0000 mL | INTRAMUSCULAR | Status: DC | PRN
Start: 2018-03-03 — End: 2018-03-03
  Filled 2018-03-03: qty 30

## 2018-03-03 MED ORDER — LIDOCAINE HCL (PF) 1 % IJ SOLN
INTRAMUSCULAR | Status: DC | PRN
  Administered 2018-03-03: 4 mL via EPIDURAL
  Administered 2018-03-03: 5 mL via EPIDURAL

## 2018-03-03 MED ORDER — MAGNESIUM SULFATE 40 G IN LACTATED RINGERS - SIMPLE
2.0000 g/h | INTRAVENOUS | Status: DC
  Administered 2018-03-03: 2 g/h via INTRAVENOUS
  Filled 2018-03-03: qty 40

## 2018-03-03 MED ORDER — PHENYLEPHRINE 40 MCG/ML (10ML) SYRINGE FOR IV PUSH (FOR BLOOD PRESSURE SUPPORT)
PREFILLED_SYRINGE | INTRAVENOUS | Status: AC
  Filled 2018-03-03: qty 10

## 2018-03-03 MED ORDER — FENTANYL CITRATE (PF) 100 MCG/2ML IJ SOLN: 50.0000 ug | INTRAMUSCULAR | Status: DC | PRN

## 2018-03-03 MED ORDER — PENICILLIN G POTASSIUM 5000000 UNITS IJ SOLR
5.0000 10*6.[IU] | Freq: Once | INTRAMUSCULAR | Status: AC
  Administered 2018-03-03: 5 10*6.[IU] via INTRAVENOUS
  Filled 2018-03-03: qty 5

## 2018-03-03 MED ORDER — DIPHENHYDRAMINE HCL 50 MG/ML IJ SOLN: 12.5000 mg | INTRAMUSCULAR | Status: DC | PRN

## 2018-03-03 MED ORDER — LACTATED RINGERS IV SOLN: 500.0000 mL | Freq: Once | INTRAVENOUS | Status: DC

## 2018-03-03 MED ORDER — ZOLPIDEM TARTRATE 5 MG PO TABS: 5.0000 mg | ORAL_TABLET | Freq: Every evening | ORAL | Status: DC | PRN

## 2018-03-03 MED ORDER — OXYCODONE-ACETAMINOPHEN 5-325 MG PO TABS: 2.0000 | ORAL_TABLET | ORAL | Status: DC | PRN

## 2018-03-03 MED ORDER — DIPHENHYDRAMINE HCL 25 MG PO CAPS
25.0000 mg | ORAL_CAPSULE | Freq: Four times a day (QID) | ORAL | Status: DC | PRN
Start: 2018-03-03 — End: 2018-03-05

## 2018-03-03 MED ORDER — BUTALBITAL-APAP-CAFFEINE 50-325-40 MG PO TABS
1.0000 | ORAL_TABLET | ORAL | Status: DC | PRN
  Administered 2018-03-04: 1 via ORAL
  Filled 2018-03-03: qty 1

## 2018-03-03 MED ORDER — TETANUS-DIPHTH-ACELL PERTUSSIS 5-2.5-18.5 LF-MCG/0.5 IM SUSP: 0.5000 mL | Freq: Once | INTRAMUSCULAR | Status: DC

## 2018-03-03 MED ORDER — EPHEDRINE 5 MG/ML INJ
10.0000 mg | INTRAVENOUS | Status: DC | PRN
  Filled 2018-03-03: qty 2

## 2018-03-03 MED ORDER — BENZOCAINE-MENTHOL 20-0.5 % EX AERO: 1.0000 "application " | INHALATION_SPRAY | CUTANEOUS | Status: DC | PRN

## 2018-03-03 MED ORDER — OXYTOCIN BOLUS FROM INFUSION
500.0000 mL | Freq: Once | INTRAVENOUS | Status: AC
  Administered 2018-03-03: 100 mL via INTRAVENOUS

## 2018-03-03 MED ORDER — TERBUTALINE SULFATE 1 MG/ML IJ SOLN: 0.2500 mg | Freq: Once | INTRAMUSCULAR | Status: DC | PRN

## 2018-03-03 MED ORDER — LACTATED RINGERS IV SOLN
500.0000 mL | Freq: Once | INTRAVENOUS | Status: AC
  Administered 2018-03-03: 250 mL via INTRAVENOUS

## 2018-03-03 MED ORDER — MAGNESIUM SULFATE 40 G IN LACTATED RINGERS - SIMPLE
2.0000 g/h | INTRAVENOUS | Status: AC
  Administered 2018-03-03 – 2018-03-04 (×2): 2 g/h via INTRAVENOUS
  Filled 2018-03-03: qty 500
  Filled 2018-03-03: qty 40

## 2018-03-03 MED ORDER — OXYCODONE-ACETAMINOPHEN 5-325 MG PO TABS: 1.0000 | ORAL_TABLET | ORAL | Status: DC | PRN

## 2018-03-03 MED ORDER — OXYTOCIN 40 UNITS IN LACTATED RINGERS INFUSION - SIMPLE MED: 2.5000 [IU]/h | INTRAVENOUS | Status: DC

## 2018-03-03 MED ORDER — MAGNESIUM SULFATE BOLUS VIA INFUSION
4.0000 g | Freq: Once | INTRAVENOUS | Status: AC
  Administered 2018-03-03: 4 g via INTRAVENOUS
  Filled 2018-03-03: qty 500

## 2018-03-03 MED ORDER — SIMETHICONE 80 MG PO CHEW: 80.0000 mg | CHEWABLE_TABLET | ORAL | Status: DC | PRN

## 2018-03-03 MED ORDER — EPHEDRINE 5 MG/ML INJ
10.0000 mg | INTRAVENOUS | Status: DC | PRN
Start: 2018-03-03 — End: 2018-03-03
  Filled 2018-03-03: qty 2

## 2018-03-03 MED ORDER — SOD CITRATE-CITRIC ACID 500-334 MG/5ML PO SOLN: 30.0000 mL | ORAL | Status: DC | PRN

## 2018-03-03 MED ORDER — PRENATAL MULTIVITAMIN CH
1.0000 | ORAL_TABLET | Freq: Every day | ORAL | Status: DC
  Administered 2018-03-04: 1 via ORAL
  Filled 2018-03-03: qty 1

## 2018-03-03 NOTE — Progress Notes (Signed)
Pt's partner asked me to room. Checked pt. Said she had a "nose bleed" checked tissue. Extremely small trace amounts of blood on a tissue. No saturation. Offered to call for nasal saline spray to provide moisture.  Pt. Concerned with "bubbles" in her urine. Normal postpartum appearing urine with no report of urinary symptoms.

## 2018-03-03 NOTE — Progress Notes (Addendum)
Faculty Practice OB/GYN Attending Note  Subjective:  Patient with worsening BP this morning, required doses of IV Labetalol for control.  Complains of worsening headache currently. No epigastric/RUQ pain. No vision issues.  Reports frequent contractions since this morning, thinks she is going into PTL.    No LOF or vaginal bleeding. Good FM. Of note patient reports 7# weight gain since admission.  Admitted on 03/01/2018 for Chronic hypertension with superimposed preeclampsia.    Objective:  Blood pressure (!) 145/92, pulse (!) 114, temperature 98.1 F (36.7 C), temperature source Oral, resp. rate 18, height 5\' 6"  (1.676 m), weight 160 lb 8 oz (72.8 kg), last menstrual period 07/08/2017, SpO2 99 %, not currently breastfeeding.  Patient Vitals for the past 24 hrs:  BP Temp Temp src Pulse Resp SpO2 Weight  03/03/18 0852 (!) 145/92 98.1 F (36.7 C) Oral (!) 114 18 99 % -  03/03/18 0845 - - - - - - 160 lb 8 oz (72.8 kg)  03/03/18 0544 (!) 156/96 - - 91 19 - -  03/03/18 0507 (!) 185/115 - - 96 - - -  03/03/18 0430 (!) 172/106 98.1 F (36.7 C) - 94 16 - -  03/03/18 0031 (!) 145/97 98.5 F (36.9 C) Oral 92 19 95 % -  03/02/18 2205 (!) 150/100 - - 90 18 99 % -  03/02/18 1922 (!) 157/102 98.8 F (37.1 C) Oral 94 16 100 % -  03/02/18 1557 (!) 140/96 98.1 F (36.7 C) Oral 75 16 96 % -  03/02/18 1138 (!) 139/100 97.8 F (36.6 C) Oral 95 18 99 % -   FHT  Baseline 135 bpm, moderate variability, +accelerations, occasional variable decelerations Toco: q2-5 minutes Gen: NAD HENT: Normocephalic, atraumatic Lungs: Normal respiratory effort Heart: Regular rate noted Abdomen: NT, gravid fundus, soft Cervix: 3/50/-3/cephalic Ext: 2-3+ DTRs, no edema, no cyanosis, negative Homan's sign  CBC Latest Ref Rng & Units 03/03/2018 03/02/2018 03/01/2018  WBC 4.0 - 10.5 K/uL 15.9(H) 11.3(H) 8.4  Hemoglobin 12.0 - 15.0 g/dL 10.6(L) 10.3(L) 11.2(L)  Hematocrit 36.0 - 46.0 % 31.6(L) 30.6(L) 33.6(L)  Platelets 150  - 400 K/uL 191 190 199   CMP Latest Ref Rng & Units 03/03/2018 03/02/2018 03/01/2018  Glucose 65 - 99 mg/dL 99 308(M) 88  BUN 6 - 20 mg/dL 11 15 14   Creatinine 0.44 - 1.00 mg/dL 5.78 4.69 6.29  Sodium 135 - 145 mmol/L 137 137 136  Potassium 3.5 - 5.1 mmol/L 4.0 3.6 4.2  Chloride 101 - 111 mmol/L 107 110 106  CO2 22 - 32 mmol/L 20(L) 18(L) 20(L)  Calcium 8.9 - 10.3 mg/dL 9.2 9.2 9.0  Total Protein 6.5 - 8.1 g/dL 6.7 6.7 7.1  Total Bilirubin 0.3 - 1.2 mg/dL 0.6 0.5 0.6  Alkaline Phos 38 - 126 U/L 80 75 82  AST 15 - 41 U/L 23 21 24   ALT 14 - 54 U/L 15 13(L) 16   [name] Mfm Ob Detail +14 Wk  Result Date: 03/02/2018 ----------------------------------------------------------------------  OBSTETRICS REPORT                      (Signed Final 03/02/2018 01:26 pm) ---------------------------------------------------------------------- Patient Info  ID #:       [id number]                          D.O.B.:  1986/06/12 (32 yrs)  Name:       Alyssa Mathis  Visit Date: 03/02/2018 09:54 am ---------------------------------------------------------------------- Performed By  Performed By:     Raoul PitchLora Mae Murrow        Ref. Address:      [address]                    [address]                                                              [address]                                                              [place], [place]                                                              [phone]  Attending:        Darlyn ReadEmily Bunce MD         Secondary Phy.:    3rd Nursing- HR                                                              OB                                                              3rd Floor  Referred By:      Billey GoslingHARLIE                [facility]          [facility]                    PICKENS MD ---------------------------------------------------------------------- Orders   #  Description                                 Code   1  US MFM OB DETAIL +14 WK                     76811.01   ----------------------------------------------------------------------   #  Ordered By               Order #        Accession #    Episode #   1  [place] BingHARLIE PICKENS          [id number]      [id number]     [id number]  ---------------------------------------------------------------------- Indications   [redacted] weeks gestation of pregnancy  Z3A.33   Hypertension - Chronic with superimposed        O11.9 O10.919   preeclampsia   Poor obstetric history: Previous                O09.299   preeclampsia / eclampsia/gestational HTN  ---------------------------------------------------------------------- OB History  Gravidity:    5         Term:   1         Prem:  1         SAB:   2  Living:       2 ---------------------------------------------------------------------- Fetal Evaluation  Num Of Fetuses:     1  Fetal Heart         161  Rate(bpm):  Cardiac Activity:   Observed  Presentation:       Cephalic  Placenta:           Right lateral, above cervical os  P. Cord Insertion:  Not well visualized  Amniotic Fluid  AFI FV:      Oligohydramnios  AFI Sum(cm)     %Tile       Largest Pocket(cm)  4.79            < 3         3.01  RUQ(cm)       RLQ(cm)       LUQ(cm)        LLQ(cm)  1.05          0.34          3.01           0.39 ---------------------------------------------------------------------- Biometry  BPD:      87.1  mm     G. Age:  35w 1d         82  %    CI:         79.66  %    70 - 86                                                          FL/HC:       19.3  %    19.4 - 21.8  HC:      308.4  mm     G. Age:  34w 3d         28  %    HC/AC:       0.97       0.96 - 1.11  AC:      318.7  mm     G. Age:  35w 5d         93  %    FL/BPD:      68.3  %    71 - 87  FL:       59.5  mm     G. Age:  31w 0d        < 3  %    FL/AC:       18.7  %    20 - 24  HUM:      54.1  mm     G. Age:  31w 3d          9  %  Est. FW:    2404   gm     5 lb 5 oz  69  % ---------------------------------------------------------------------- Gestational  Age  LMP:           33w 6d        Date:  07/08/17                 EDD:    04/14/18  U/S Today:     34w 1d                                        EDD:    04/12/18  Best:          33w 6d    Det. By:   LMP  (07/08/17)          EDD:    04/14/18 ---------------------------------------------------------------------- Anatomy  Cranium:               Appears normal         Aortic Arch:            Not well visualized  Cavum:                 Not well visualized    Ductal Arch:            Not well visualized  Ventricles:            Appears normal         Diaphragm:              Appears normal  Choroid Plexus:        Not well visualized    Stomach:                Appears normal, left                                                                        sided  Cerebellum:            Not well visualized    Abdomen:                Appears normal  Posterior Fossa:       Not well visualized    Abdominal Wall:         Not well visualized  Face:                  Orbits appear          Cord Vessels:           Appears normal (3                         normal                                         vessel cord)  Lips:                  Not well visualized    Kidneys:                Appear normal  Thoracic:  Appears normal         Bladder:                Appears normal  Heart:                 Appears normal         Spine:                  Not well visualized                         (4CH, axis, and situs  RVOT:                  Not well visualized    Upper Extremities:      Present  LVOT:                  Not well visualized    Lower Extremities:      Present  Other:  Technically difficult due to advanced gestational age, fetal position          and oligohydramnios. ---------------------------------------------------------------------- Impression  Single living intrauterine pregnancy at 33w 6d.  Placenta Right lateral, above cervical os.  Composite fetal growth in the 69%ile. AC 93%ile. FL<3%ile.  HL 9%ile.  Limited views of the  long bones appear sonographically  within normal limits.  Oligohydramnios with AFI 4.79cm.  The fetal anatomic survey is limited due to advanced  gestational age, fetal position, and oligohydramnios.  No gross fetal anomalies identified. ---------------------------------------------------------------------- Recommendations  Continue clinical evaluation and management. ----------------------------------------------------------------------                   Darlyn Read, MD Electronically Signed Final Report   03/02/2018 01:26 pm ----------------------------------------------------------------------   Assessment & Plan:  32 y.o. [id number] at [redacted]w[redacted]d admitted for worsening CHTN; now with superimposed severe preeclampsia, oligohydramnios needing to proceed towards delivery. Received antenatal steroids on 6/15 and 6/16; overall reassuring FHR tracing.  Patient also likely in  preterm labor given her cervical dilation. Discussed need for delivery with patient, she agrees to this plan. Understands that infant will be admitted to NICU given preterm status (she wanted her baby at bedside); will consult Neonatalogy. *Will transfer to L&D for IOL; L&D staff notified *Magnesium sulfate to be started for eclampsia prophylaxis; Hydralazine protocol for severe range BP.  Continue oral Labetalol for now. *FHR monitoring as per protocol; talked to Dr. Cleatis Polka (Neonatology) for need for consult. *Will need PCN for GBS unknown in the setting of prematurity *Hopeful for SVD Continue close observation.  Jaynie Collins, MD, FACOG Obstetrician & Gynecologist, [facility] for [employer], [facility]

## 2018-03-03 NOTE — Anesthesia Procedure Notes (Signed)
Epidural Patient location during procedure: OB Start time: 03/03/2018 4:05 PM  Staffing Anesthesiologist: Mal AmabileFoster, Smith Potenza, MD Performed: anesthesiologist   Preanesthetic Checklist Completed: patient identified, site marked, surgical consent, pre-op evaluation, timeout performed, IV checked, risks and benefits discussed and monitors and equipment checked  Epidural Patient position: sitting Prep: site prepped and draped and DuraPrep Patient monitoring: continuous pulse ox and blood pressure Approach: midline Location: L3-L4 Injection technique: LOR air  Needle:  Needle type: Tuohy  Needle gauge: 17 G Needle length: 9 cm and 9 Needle insertion depth: 5 cm cm Catheter type: closed end flexible Catheter size: 19 Gauge Catheter at skin depth: 10 cm Test dose: negative and Other  Assessment Events: blood not aspirated, injection not painful, no injection resistance, negative IV test and no paresthesia  Additional Notes Patient identified. Risks and benefits discussed including failed block, incomplete  Pain control, post dural puncture headache, nerve damage, paralysis, blood pressure Changes, nausea, vomiting, reactions to medications-both toxic and allergic and post Partum back pain. All questions were answered. Patient expressed understanding and wished to proceed. Sterile technique was used throughout procedure. Epidural site was Dressed with sterile barrier dressing. No paresthesias, signs of intravascular injection Or signs of intrathecal spread were encountered.  Patient was more comfortable after the epidural was dosed. Please see RN's note for documentation of vital signs and FHR which are stable.

## 2018-03-03 NOTE — Progress Notes (Signed)
Patient ID: Alyssa Mathis, female   DOB: 06/07/1986, 32 y.o.   MRN: [id number] Developed variable decels with UCs to 70-80s  AROM little fluid Attempted IUPC in several positions but was unable to advance  ISE placed FHR now improved with contractions Good variability  WIll continue plan of care, anticipate SVD soon

## 2018-03-03 NOTE — Progress Notes (Signed)
Labor Progress Note Alyssa Mathis is a 32 y.o. K2C1798 at 36w0dpresented for IOL for development of super-imposed preE with SF by Bps. S: Feeling comfortable.    O:  BP 140/90   Pulse 95   Temp 97.9 F (36.6 C) (Oral)   Resp 18   Ht _0  (1.676 m)   Wt 72.8 kg (160 lb 8 oz)   LMP 07/08/2017   SpO2 96%   BMI 25.91 kg/m  EFM: 120 bpm baseline/moderate variability/+ accel, occ variable decel  CVE: Dilation: 3 Effacement (%): 70 Cervical Position: Middle Station: -3 Presentation: Vertex Exam by:: MCarmelia RollerCNM   A&P: 32y.o. [id number] for IOL for super-imposed preE with SF by Bps.  #Labor:Transferred to L&D this AM. SVE 3/50/-3 by Dr. AnHarolyn Rutherfordhis morning, now 3/80/-3. Continue pitocin.  #super-imposed preE: by severe range Bps this AM. Mag started; patient continuing oral labetalol while in latent labor. Hydralazine ordered prn for severe range pressures. Asymptomatic at this time. [place]. Neonatologist met with patient this AM. #Oligohydramnios #Pain: per patient request #FWB: Cat II FHT for occ variables otherwise reassuring #GBS unknown; PCN ordered  BrVira AgarMD 2:51 PM

## 2018-03-03 NOTE — Progress Notes (Signed)
Labor Progress Note Zoie Curfman is a 32 y.o. H3J2508 at 24w0dpresented for IOL for development of super-imposed preE with SF by Bps. S: Transferred to L&D from observation this morning due to worsening Bps and development of SIPS.   O:  BP 137/90   Pulse (!) 105   Temp 98.5 F (36.9 C) (Oral)   Resp 16   Ht 5' 6"  (1.676 m)   Wt 72.8 kg (160 lb 8 oz)   LMP 07/08/2017   SpO2 98%   BMI 25.91 kg/m  EFM: 140 bpm baseline/moderate variability/+ accel, no decel  CVE: Dilation: 3 Effacement (%): 50 Cervical Position: Posterior Station: -3 Presentation: Vertex Exam by:: Dr. AHarolyn Rutherford  A&P: 32y.o. [id number] for IOL for super-imposed preE with SF by Bps.  #Labor:Transferred to L&D this AM. SVE 3/50/-3 by Dr. AnHarolyn Rutherfordhis morning. Pitocin ordered. #super-imposed preE: by severe range Bps this AM. Mag started; patient continuing oral labetalol while in latent labor. Hydralazine ordered prn for severe range pressures. Asymptomatic at this time. Neonatologist met with patient this AM. #Oligohydramnios #Pain: per patient request #FWB: Cat 1 FHT #GBS unknown; PCN ordered  BrVira AgarMD 10:51 AM

## 2018-03-03 NOTE — Anesthesia Preprocedure Evaluation (Addendum)
Anesthesia Evaluation  Patient identified by MRN, date of birth, ID band Patient awake    Reviewed: Allergy & Precautions, H&P , NPO status , Patient's Chart, lab work & pertinent test results, reviewed documented beta blocker date and time   Airway Mallampati: I  TM Distance: >3 FB Neck ROM: full    Dental no notable dental hx. (+) Teeth Intact   Pulmonary neg pulmonary ROS,    Pulmonary exam normal breath sounds clear to auscultation       Cardiovascular hypertension, Pt. on medications and Pt. on home beta blockers Normal cardiovascular exam Rhythm:Regular Rate:Normal     Neuro/Psych negative neurological ROS  negative psych ROS   GI/Hepatic Neg liver ROS, GERD  ,  Endo/Other  negative endocrine ROS  Renal/GU negative Renal ROS  negative genitourinary   Musculoskeletal negative musculoskeletal ROS (+)   Abdominal Normal abdominal exam  (+)   Peds  Hematology  (+) anemia ,   Anesthesia Other Findings   Reproductive/Obstetrics (+) Pregnancy Severe pre eclampsia                            Anesthesia Physical  Anesthesia Plan  ASA: III  Anesthesia Plan: Epidural   Post-op Pain Management:    Induction:   PONV Risk Score and Plan:   Airway Management Planned: Natural Airway  Additional Equipment:   Intra-op Plan:   Post-operative Plan:   Informed Consent: I have reviewed the patients History and Physical, chart, labs and discussed the procedure including the risks, benefits and alternatives for the proposed anesthesia with the patient or authorized representative who has indicated his/her understanding and acceptance.     Plan Discussed with: Anesthesiologist  Anesthesia Plan Comments:        Anesthesia Quick Evaluation

## 2018-03-03 NOTE — Progress Notes (Signed)
Blood pressure 172/106 DTR's WNL denies a headache Labetalol protocol at 80 mg IV.Labetalol 80 mg IV given.

## 2018-03-03 NOTE — Progress Notes (Signed)
Blood pressure repeat 185/115 Hydralazine 10 mg IV push per protocol given.

## 2018-03-03 NOTE — Consult Note (Signed)
Neonatology Consult to Antenatal Patient:  I was asked by Dr. Macon LargeAnyanwu to see this patient in order to provide antenatal counseling due to preterm labor.  Alyssa Mathis is a [id number] who was admitted here 03/01/18 at 33 5/[redacted] weeks GA. She is currently 34 0/7 weeks with severe pre-eclampsia warranting induction of labor.  Reassuring fetal status.  Pregnancy also complicated by oligohydramnois. She is s/p BMZ 6/16 and is currently receiving oral Labetalol and IV mag and prophylactic IV PCN plus prn Hydralazine.  She has a h/o CHTN and recent prior 2626 week boy ([facility] and a 37 week boy.  She states both did extremely well. Of note per MAU note, she was "[facility] for severe range BP there yesterday and [facility] in [facility] pre[facility]term setting, however [facility] and [facility] out [facility]."  No history of SW involvement, psych issues, tobacco or substance abuse.     I spoke with the patient and father of baby with L&D nurse at bedside. We discussed the expected delivery in the next 1-2 days, including usual DR management, possible respiratory complications and need for support, IV access, feedings, length of stay, mortality and morbidity, and long term outcomes. She expressed understanding and agreement that her baby would have to come to the NICU and that assessment of his developmental maturity would immediately ensue with supportive care as indicated.  They did not have any questions at this time. I offered a NICU tour to any interested family members and would be glad to come back if she has more questions later.  Thank you for asking me to see this patient.  Jamie Brookesavid Ehrmann, MD Neonatologist  The total length of face-to-face or floor/unit time for this encounter was 15 minutes. Counseling and/or coordination of care was 35 minutes of the above.

## 2018-03-03 NOTE — Progress Notes (Signed)
Blood pressure 156/96.

## 2018-03-04 LAB — RPR: RPR: NONREACTIVE

## 2018-03-04 MED ORDER — SALINE SPRAY 0.65 % NA SOLN
1.0000 | NASAL | Status: DC | PRN
  Administered 2018-03-04: 1 via NASAL
  Filled 2018-03-04: qty 44

## 2018-03-04 MED ORDER — LABETALOL HCL 200 MG PO TABS
400.0000 mg | ORAL_TABLET | Freq: Two times a day (BID) | ORAL | Status: DC
  Administered 2018-03-04: 400 mg via ORAL
  Filled 2018-03-04: qty 2

## 2018-03-04 MED ORDER — LABETALOL HCL 200 MG PO TABS
600.0000 mg | ORAL_TABLET | Freq: Two times a day (BID) | ORAL | Status: DC
  Administered 2018-03-04 – 2018-03-05 (×2): 600 mg via ORAL
  Filled 2018-03-04 (×2): qty 3

## 2018-03-04 MED ORDER — LACTATED RINGERS IV SOLN: INTRAVENOUS | Status: DC

## 2018-03-04 MED ORDER — HYDRALAZINE HCL 20 MG/ML IJ SOLN: 10.0000 mg | INTRAMUSCULAR | Status: DC | PRN

## 2018-03-04 NOTE — Progress Notes (Signed)
Went into patients room to bring another bag of fluids and patient had disconnected IV. She was preparing for a shower and told me that she was going to shower even though I went over the the effects of magnesium. I called Dr. Alysia PennaErvin and he said that we cant make the patient do anything they don't want to do. Will reconnect IV and continue with medication once patient gets out of the shower.

## 2018-03-04 NOTE — Progress Notes (Signed)
Post Partum Day 1 s/p preterm SVD after IOL for CHTN with superimposed severe preeclampsia at 1952w0d  Subjective: Denies headaches, visual symptoms, RUQ/epigastric pain.  No other complaints, up ad lib, voiding and tolerating PO intake. Breast and bottle feeding; infant is stable in NICU.  Objective: Blood pressure (!) 154/104, pulse 93, temperature (!) 97.5 F (36.4 C), temperature source Oral, resp. rate 18, height 5\' 6"  (1.676 m), weight 160 lb 8 oz (72.8 kg), last menstrual period 07/08/2017, SpO2 96 %, unknown if currently breastfeeding. Patient Vitals for the past 24 hrs:  BP Temp Temp src Pulse Resp SpO2  03/04/18 0921 (!) 154/104 - - 93 - 96 %  03/04/18 0757 (!) 143/110 (!) 97.5 F (36.4 C) Oral 87 18 99 %  03/04/18 0401 (!) 146/95 98.3 F (36.8 C) Oral 100 17 -  03/03/18 2332 (!) 140/99 98.8 F (37.1 C) Oral 93 17 98 %  03/03/18 1959 (!) 137/91 98.2 F (36.8 C) Oral 93 17 97 %  03/03/18 1804 (!) 147/104 98 F (36.7 C) Oral 87 18 98 %  03/03/18 1731 (!) 131/92 - - 92 - -  03/03/18 1716 131/89 - - 81 16 -  03/03/18 1701 (!) 125/92 - - 87 16 -  03/03/18 1648 (!) 139/97 97.7 F (36.5 C) Oral 96 16 -  03/03/18 1633 138/90 - - 87 - -  03/03/18 1631 136/90 - - 88 - -  03/03/18 1616 (!) 158/93 - - 94 - 97 %  03/03/18 1615 (!) 161/103 - - 90 - -  03/03/18 1602 (!) 147/95 97.7 F (36.5 C) Oral 98 18 -  03/03/18 1531 (!) 146/98 - - 80 - 98 %  03/03/18 1505 136/87 - - 93 - -  03/03/18 1501 (!) 150/107 - - 99 - -  03/03/18 1431 140/90 - - 95 - -  03/03/18 1403 (!) 141/91 97.9 F (36.6 C) Oral 95 18 -  03/03/18 1302 138/76 - - 86 18 -  03/03/18 1231 (!) 134/91 - - 91 16 96 %  03/03/18 1200 (!) 144/102 - - 91 18 98 %  03/03/18 1057 134/78 - - 98 16 98 %  03/03/18 1047 137/90 - - (!) 105 16 98 %  03/03/18 1037 (!) 150/95 98.5 F (36.9 C) Oral (!) 101 16 99 %  03/03/18 1021 (!) 149/103 98.5 F (36.9 C) Oral (!) 102 17 99 %    Physical Exam:  General: alert and no  distress Lungs: CTAB Hear:RRR Lochia: appropriate Uterine Fundus: firm, NT DVT Evaluation: No evidence of DVT seen on physical exam. Negative Homan's sign. No cords or calf tenderness. No significant calf/ankle edema.  CMP Latest Ref Rng & Units 03/03/2018 03/03/2018 03/02/2018  Glucose 65 - 99 mg/dL 409(W100(H) 99 119(J139(H)  BUN 6 - 20 mg/dL 13 11 15   Creatinine 0.44 - 1.00 mg/dL 4.780.67 2.950.72 6.210.59  Sodium 135 - 145 mmol/L 135 137 137  Potassium 3.5 - 5.1 mmol/L 3.8 4.0 3.6  Chloride 101 - 111 mmol/L 106 107 110  CO2 22 - 32 mmol/L 19(L) 20(L) 18(L)  Calcium 8.9 - 10.3 mg/dL 3.0(Q8.5(L) 9.2 9.2  Total Protein 6.5 - 8.1 g/dL 6.8 6.7 6.7  Total Bilirubin 0.3 - 1.2 mg/dL 0.4 0.6 0.5  Alkaline Phos 38 - 126 U/L 78 80 75  AST 15 - 41 U/L 24 23 21   ALT 14 - 54 U/L 15 15 13(L)   CBC Latest Ref Rng & Units 03/03/2018 03/03/2018 03/02/2018  WBC 4.0 -  10.5 K/uL 14.9(H) 15.9(H) 11.3(H)  Hemoglobin 12.0 - 15.0 g/dL 10.3(L) 10.6(L) 10.3(L)  Hematocrit 36.0 - 46.0 % 30.9(L) 31.6(L) 30.6(L)  Platelets 150 - 400 K/uL 181 191 190    Assessment/Plan: *Patient declines Procardia or other change of BP med; wants only to take Labetalol. This was restarted at 400 mg bid, will titrate as needed.  Hydralazine IV ordered prn severe range BP. Continue magnesium sulfate until 1630 today and monitor BP closely. *Bottle and breast feeding *Declines any contraception *Will follow up with primary OB in [place].Airy postpartum *Hopeful for discharge tomorrow or next day as long as BP remains stable and no other concerns.    LOS: 3 days   Jaynie Collins, MD 03/04/2018, 9:34 AM

## 2018-03-04 NOTE — Lactation Note (Signed)
This note was copied from a baby's chart. Lactation Consultation Note  Patient Name: Boy Burna SisRenita Helm ZOXWR'UToday's Date: 03/04/2018   [facility]:  RN suggested that I not go in to see patient tonight.  Mother has had her 201 and 31 year old children in her room all night and she has finally fallen asleep.  Mother stated that she was not interested in initiating pumping with the DEBP tonight but would begin in the a.m.  I will respect her wishes and inform day shift LC to make a morning visit.  If mother has any questions/concerns the RN will contact me.                Vona Whiters R Thailan Sava 03/04/2018, 3:21 AM

## 2018-03-04 NOTE — Progress Notes (Signed)
Pt called out to nurses station saying IV is leaking. RN entered room and found IV tubing unhooked from patient and IV half way out of patient's hand. MD notified and ordered to go ahead and discontinue Mag.

## 2018-03-04 NOTE — Lactation Note (Signed)
This note was copied from a baby's chart. Lactation Consultation Note  Patient Name: Alyssa Mathis ZOXWR'UToday's Date: 03/04/2018 Reason for consult: Initial assessment;NICU baby;Late-preterm 34-36.6wks;Infant weight loss(Mom is post magS04) 4622 hour old female infant  Per mom  pumped 2 ounces of BM using a  27 flange and Dad took pumped BM to NICU. Mom is knowledgeable and experienced breastfeeding woman per Mom she  breastfeed first child for one year and 2nd child who was in NICU she breastfeed for one month.  Encouraged Mom to [facility]. Discuss supply and demand and the importance of stimulation breast 8 to 12 times within 24 hours. Mom is receptive to teaching and review.  Mom made aware of O/P services, breastfeeding support groups, community resources, and our phone # for post-discharge questions.   Maternal Data Has patient been taught Hand Expression?: (Expeienced BF Mom per Mom knows how to hand express.) Does the patient have breastfeeding experience prior to this delivery?: Yes  Feeding Feeding Type: Breast Milk  LATCH Score                   Interventions Interventions: Breast feeding basics reviewed;Hand express;DEBP  Lactation Tools Discussed/Used Tools: Pump;Flanges Flange Size: 27(Per Mom 27 is comfortable for her.) Breast pump type: Double-Electric Breast Pump [facility] [place] [facility] [place] Pump Review: Setup, frequency, and cleaning;Milk Storage Initiated by:: RN and LC reviewed . Date initiated:: 03/04/18   Consult Status Consult Status: Follow-up Date: 03/05/18 Follow-up type: In-patient    Danelle EarthlyRobin Tamirah George 03/04/2018, 3:24 PM

## 2018-03-04 NOTE — Progress Notes (Signed)
Rn explained to pt the need to remove IV per Dr. Macon LargeAnyanwu.  IV removed.  PT and support person verbally hostile towards nurse. Security, house coverage, charge nurse and social work in department to deescalate support person.

## 2018-03-04 NOTE — Anesthesia Postprocedure Evaluation (Signed)
Anesthesia Post Note  Patient: Alyssa Mathis  Procedure(s) Performed: AN AD HOC LABOR EPIDURAL     Patient location during evaluation: Mother Baby Anesthesia Type: Epidural Level of consciousness: awake and alert Pain management: pain level controlled Vital Signs Assessment: post-procedure vital signs reviewed and stable Respiratory status: spontaneous breathing, nonlabored ventilation and respiratory function stable Cardiovascular status: stable Postop Assessment: no headache, no backache and epidural receding Anesthetic complications: no    Last Vitals:  Vitals:   03/04/18 0921 03/04/18 1058  BP: (!) 154/104 (!) 138/102  Pulse: 93 88  Resp:    Temp:    SpO2: 96%     Last Pain:  Vitals:   03/04/18 0846  TempSrc:   PainSc: 0-No pain   Pain Goal: Patients Stated Pain Goal: 3 (03/04/18 0846)               Emmaline KluverBREWER,[name]

## 2018-03-05 LAB — COMPREHENSIVE METABOLIC PANEL
ALBUMIN: 2.7 g/dL — AB (ref 3.5–5.0)
ALT: 25 U/L (ref 14–54)
AST: 35 U/L (ref 15–41)
Alkaline Phosphatase: 68 U/L (ref 38–126)
Anion gap: 9 (ref 5–15)
BUN: 12 mg/dL (ref 6–20)
CHLORIDE: 105 mmol/L (ref 101–111)
CO2: 24 mmol/L (ref 22–32)
Calcium: 8.5 mg/dL — ABNORMAL LOW (ref 8.9–10.3)
Creatinine, Ser: 0.76 mg/dL (ref 0.44–1.00)
GFR calc Af Amer: >60 mL/min (ref >60)
GLUCOSE: 82 mg/dL (ref 65–99)
POTASSIUM: 3.9 mmol/L (ref 3.5–5.1)
SODIUM: 138 mmol/L (ref 135–145)
Total Bilirubin: 0.1 mg/dL — ABNORMAL LOW (ref 0.3–1.2)
Total Protein: 6.3 g/dL — ABNORMAL LOW (ref 6.5–8.1)

## 2018-03-05 LAB — CBC
HCT: 28.8 % — ABNORMAL LOW (ref 36.0–46.0)
Hemoglobin: 9.5 g/dL — ABNORMAL LOW (ref 12.0–15.0)
MCH: 30.6 pg (ref 26.0–34.0)
MCHC: 33 g/dL (ref 30.0–36.0)
MCV: 92.9 fL (ref 78.0–100.0)
PLATELETS: 167 10*3/uL (ref 150–400)
RBC: 3.1 MIL/uL — ABNORMAL LOW (ref 3.87–5.11)
RDW: 13.2 % (ref 11.5–15.5)
WBC: 11 10*3/uL — AB (ref 4.0–10.5)

## 2018-03-05 MED ORDER — IBUPROFEN 600 MG PO TABS: 600.0000 mg | ORAL_TABLET | Freq: Four times a day (QID) | ORAL | 2 refills | Status: DC | PRN

## 2018-03-05 MED ORDER — PRENATAL MULTIVITAMIN CH: 1.0000 | ORAL_TABLET | Freq: Every day | ORAL | 3 refills | Status: DC

## 2018-03-05 MED ORDER — LABETALOL HCL 300 MG PO TABS: 600.0000 mg | ORAL_TABLET | Freq: Two times a day (BID) | ORAL | 2 refills | Status: DC

## 2018-03-05 NOTE — Discharge Summary (Addendum)
OB Discharge Summary     Patient Name: Alyssa Mathis DOB: April 23, 1986 MRN: [id number]  Date of admission: 03/01/2018 Delivering MD: Lilly CoveHIPKINS, BRITTANY P   Date of discharge: 03/05/2018  Admitting diagnosis: 33.5 WKS, HIGH BP, LOWER ABD PAIN, DIFFICULTY WALKING Intrauterine pregnancy: 8165w0d     Secondary diagnosis:  Principal Problem:   Chronic hypertension with superimposed severe preeclampsia Active Problems:   Poor compliance   Oligohydramnios antepartum, third trimester   Spontaneous vaginal delivery  Additional problems: None     Discharge diagnosis: Preterm Pregnancy Delivered, Preeclampsia (severe) and CHTN with superimposed preeclampsia                                                                                                Post partum procedures:  Magnesium sulfate eclampsia prophylaxis  Augmentation: AROM and Pitocin  Complications: None  Hospital course:  Induction of Labor With Vaginal Delivery   32 y.o. yo [id number] at 7465w0d was admitted to the hospital on 03/01/2018 for chronic hypertension with superimposed preeclampsia. [facility] and [facility] herself here for management. She was monitored and BP was initially managed on Labetalol. She received betamethasone x 2 and FHR remained reassuring. Fetal ultrasound did show concerning finding of oligohydramnios with AFI of 4.  On HD#3, her BP was noted to worsen into severe range, she had a severe headache and she was noted to have frequent contractions concerning for PTL. She was treated with IV antihypertensives and the plan was made to proceed with induction of labor.  Indication for induction: Severe Preeclampsia.  Patient had an uncomplicated labor course as follows: Membrane Rupture Time/Date: 3:28 PM ,03/03/2018   Intrapartum Procedures: Episiotomy: None [1]                                         Lacerations:  None [1]  Patient had delivery of a Viable infant on 03/03/2018.   Information for the patient's newborn:  Sherri SearHughes, Boy Haisley [[id number]][[id number]]  Delivery Method: Vaginal, Spontaneous(Filed from Delivery Summary) Details of delivery can be found in separate delivery note.  Patient received magnesium sulfate postpartum x 24 hours, and her Labetalol was increased to 600 mg bid for better control. She otherwise had a routine postpartum course.  By PPD#2, she was deemed stable to be discharged home.  Physical exam   03/04/18 2249 03/05/18 0530 03/05/18 0910  BP: (!) 144/98 (!) 150/105 120/81  Pulse: 82 83 (!) 102  Resp: 17 17   Temp: 98 F (36.7 C) 98.3 F (36.8 C) 97.8 F (36.6 C)  TempSrc: Oral Oral Oral  SpO2: 98% 97% 98%  Weight:     Height:      General: alert and no distress Lungs: CTAB Hear:RRR Lochia: appropriate Uterine Fundus: firm, NT DVT Evaluation: No evidence of DVT seen on physical exam. Negative Homan's sign. No cords or calf tenderness. No significant calf/ankle edema   Labs: CBC Latest Ref Rng & Units 03/05/2018 03/03/2018 03/03/2018  WBC 4.0 - 10.5 K/uL 11.0(H) 14.9(H) 15.9(H)  Hemoglobin 12.0 - 15.0 g/dL 1.6(X) 10.3(L) 10.6(L)  Hematocrit 36.0 - 46.0 % 28.8(L) 30.9(L) 31.6(L)  Platelets 150 - 400 K/uL 167 181 191    CMP Latest Ref Rng & Units 03/05/2018 03/03/2018 03/03/2018  Glucose 65 - 99 mg/dL 82 096(E) 99  BUN 6 - 20 mg/dL 12 13 11   Creatinine 0.44 - 1.00 mg/dL 4.54 0.98 1.19  Sodium 135 - 145 mmol/L 138 135 137  Potassium 3.5 - 5.1 mmol/L 3.9 3.8 4.0  Chloride 101 - 111 mmol/L 105 106 107  CO2 22 - 32 mmol/L 24 19(L) 20(L)  Calcium 8.9 - 10.3 mg/dL 1.4(N) 8.2(N) 9.2  Total Protein 6.5 - 8.1 g/dL 6.3(L) 6.8 6.7  Total Bilirubin 0.3 - 1.2 mg/dL 5.6(O) 0.4 0.6  Alkaline Phos 38 - 126 U/L 68 78 80  AST 15 - 41 U/L 35 24 23  ALT 14 - 54 U/L 25 15 15    Discharge instruction: per After Visit Summary and "Baby and Me Booklet".  After visit meds:  Allergies as of 03/05/2018   No Known Allergies     Medication List     STOP taking these medications   hydrochlorothiazide 25 MG tablet Commonly known as:  HYDRODIURIL     TAKE these medications   ibuprofen 600 MG tablet Commonly known as:  ADVIL,MOTRIN Take 1 tablet (600 mg total) by mouth every 6 (six) hours as needed for moderate pain or cramping. What changed:  reasons to take this   Iron 325 (65 Fe) MG Tabs TAKE ONE TABLET BY MOUTH TWICE DAILY WITH  A  MEAL   labetalol 300 MG tablet Commonly known as:  NORMODYNE Take 2 tablets (600 mg total) by mouth 2 (two) times daily. What changed:    medication strength  how much to take  when to take this   prenatal multivitamin Tabs tablet Take 1 tablet by mouth daily. What changed:  how much to take       Diet: routine diet  Activity: Advance as tolerated. Pelvic rest for 6 weeks.   Outpatient follow up:1 week with OB provider Postpartum contraception: Declined by patient  Newborn Data: Live born female  Birth Weight: 4 lb 13.3 oz (2190 g) APGAR: 5, 8  Newborn Delivery   Birth date/time:  03/03/2018 16:26:00 Delivery type:  Vaginal, Spontaneous     Baby Feeding: Bottle and Breast Disposition:NICU  Jaynie Collins, MD, FACOG Obstetrician & Gynecologist, [facility] for [employer], [facility]

## 2018-03-05 NOTE — Progress Notes (Signed)
Teaching complete   Out with family

## 2018-03-05 NOTE — Discharge Instructions (Signed)
Vaginal Delivery, Care After °Refer to this sheet in the next few weeks. These instructions provide you with information about caring for yourself after vaginal delivery. Your health care provider may also give you more specific instructions. Your treatment has been planned according to current medical practices, but problems sometimes occur. Call your health care provider if you have any problems or questions. °What can I expect after the procedure? °After vaginal delivery, it is common to have: °· Some bleeding from your vagina. °· Soreness in your abdomen, your vagina, and the area of skin between your vaginal opening and your anus (perineum). °· Pelvic cramps. °· Fatigue. ° °Follow these instructions at home: °Medicines °· Take over-the-counter and prescription medicines only as told by your health care provider. °· If you were prescribed an antibiotic medicine, take it as told by your health care provider. Do not stop taking the antibiotic until it is finished. °Driving ° °· Do not drive or operate heavy machinery while taking prescription pain medicine. °· Do not drive for 24 hours if you received a sedative. °Lifestyle °· Do not drink alcohol. This is especially important if you are breastfeeding or taking medicine to relieve pain. °· Do not use tobacco products, including cigarettes, chewing tobacco, or e-cigarettes. If you need help quitting, ask your health care provider. °Eating and drinking °· Drink at least 8 eight-ounce glasses of water every day unless you are told not to by your health care provider. If you choose to breastfeed your baby, you may need to drink more water than this. °· Eat high-fiber foods every day. These foods may help prevent or relieve constipation. High-fiber foods include: °? Whole grain cereals and breads. °? Brown rice. °? Beans. °? Fresh fruits and vegetables. °Activity °· Return to your normal activities as told by your health care provider. Ask your health care provider  what activities are safe for you. °· Rest as much as possible. Try to rest or take a nap when your baby is sleeping. °· Do not lift anything that is heavier than your baby or 10 lb (4.5 kg) until your health care provider says that it is safe. °· Talk with your health care provider about when you can engage in sexual activity. This may depend on your: °? Risk of infection. °? Rate of healing. °? Comfort and desire to engage in sexual activity. °Vaginal Care °· If you have an episiotomy or a vaginal tear, check the area every day for signs of infection. Check for: °? More redness, swelling, or pain. °? More fluid or blood. °? Warmth. °? Pus or a bad smell. °· Do not use tampons or douches until your health care provider says this is safe. °· Watch for any blood clots that may pass from your vagina. These may look like clumps of dark red, brown, or black discharge. °General instructions °· Keep your perineum clean and dry as told by your health care provider. °· Wear loose, comfortable clothing. °· Wipe from front to back when you use the toilet. °· Ask your health care provider if you can shower or take a bath. If you had an episiotomy or a perineal tear during labor and delivery, your health care provider may tell you not to take baths for a certain length of time. °· Wear a bra that supports your breasts and fits you well. °· If possible, have someone help you with household activities and help care for your baby for at least a few days after   you leave the hospital. °· Keep all follow-up visits for you and your baby as told by your health care provider. This is important. °Contact a health care provider if: °· You have: °? Vaginal discharge that has a bad smell. °? Difficulty urinating. °? Pain when urinating. °? A sudden increase or decrease in the frequency of your bowel movements. °? More redness, swelling, or pain around your episiotomy or vaginal tear. °? More fluid or blood coming from your episiotomy or  vaginal tear. °? Pus or a bad smell coming from your episiotomy or vaginal tear. °? A fever. °? A rash. °? Little or no interest in activities you used to enjoy. °? Questions about caring for yourself or your baby. °· Your episiotomy or vaginal tear feels warm to the touch. °· Your episiotomy or vaginal tear is separating or does not appear to be healing. °· Your breasts are painful, hard, or turn red. °· You feel unusually sad or worried. °· You feel nauseous or you vomit. °· You pass large blood clots from your vagina. If you pass a blood clot from your vagina, save it to show to your health care provider. Do not flush blood clots down the toilet without having your health care provider look at them. °· You urinate more than usual. °· You are dizzy or light-headed. °· You have not breastfed at all and you have not had a menstrual period for 12 weeks after delivery. °· You have stopped breastfeeding and you have not had a menstrual period for 12 weeks after you stopped breastfeeding. °Get help right away if: °· You have: °? Pain that does not go away or does not get better with medicine. °? Chest pain. °? Difficulty breathing. °? Blurred vision or spots in your vision. °? Thoughts about hurting yourself or your baby. °· You develop pain in your abdomen or in one of your legs. °· You develop a severe headache. °· You faint. °· You bleed from your vagina so much that you fill two sanitary pads in one hour. °This information is not intended to replace advice given to you by your health care provider. Make sure you discuss any questions you have with your health care provider. °Document Released: 08/31/2000 Document Revised: 02/15/2016 Document Reviewed: 09/18/2015 °Elsevier Interactive Patient Education © 2018 [employer] ° ° ° °Preeclampsia and Eclampsia °Preeclampsia is a serious condition that develops only during pregnancy. It is also called toxemia of pregnancy. This condition causes high blood pressure along  with other symptoms, such as swelling and headaches. These symptoms may develop as the condition gets worse. Preeclampsia may occur at 20 weeks of pregnancy or later. °Diagnosing and treating preeclampsia early is very important. If not treated early, it can cause serious problems for you and your baby. One problem it can lead to is eclampsia, which is a condition that causes muscle jerking or shaking (convulsions or seizures) in the mother. Delivering your baby is the best treatment for preeclampsia or eclampsia. Preeclampsia and eclampsia symptoms usually go away after your baby is born. °What are the causes? °The cause of preeclampsia is not known. °What increases the risk? °The following risk factors make you more likely to develop preeclampsia: °· Being pregnant for the first time. °· Having had preeclampsia during a past pregnancy. °· Having a family history of preeclampsia. °· Having high blood pressure. °· Being pregnant with twins or triplets. °· Being 35 or older. °· Being African-American. °· Having kidney disease or diabetes. °·   Having medical conditions such as lupus or blood diseases. °· Being very overweight (obese). ° °What are the signs or symptoms? °The earliest signs of preeclampsia are: °· High blood pressure. °· Increased protein in your urine. Your health care provider will check for this at every visit before you give birth (prenatal visit). ° °Other symptoms that may develop as the condition gets worse include: °· Severe headaches. °· Sudden weight gain. °· Swelling of the hands, face, legs, and feet. °· Nausea and vomiting. °· Vision problems, such as blurred or double vision. °· Numbness in the face, arms, legs, and feet. °· Urinating less than usual. °· Dizziness. °· Slurred speech. °· Abdominal pain, especially upper abdominal pain. °· Convulsions or seizures. ° °Symptoms generally go away after giving birth. °How is this diagnosed? °There are no screening tests for preeclampsia. Your  health care provider will ask you about symptoms and check for signs of preeclampsia during your prenatal visits. You may also have tests that include: °· Urine tests. °· Blood tests. °· Checking your blood pressure. °· Monitoring your baby’s heart rate. °· Ultrasound. ° °How is this treated? °You and your health care provider will determine the treatment approach that is best for you. Treatment may include: °· Having more frequent prenatal exams to check for signs of preeclampsia, if you have an increased risk for preeclampsia. °· Bed rest. °· Reducing how much salt (sodium) you eat. °· Medicine to lower your blood pressure. °· Staying in the hospital, if your condition is severe. There, treatment will focus on controlling your blood pressure and the amount of fluids in your body (fluid retention). °· You may need to take medicine (magnesium sulfate) to prevent seizures. This medicine may be given as an injection or through an IV tube. °· Delivering your baby early, if your condition gets worse. You may have your labor started with medicine (induced), or you may have a cesarean delivery. ° °Follow these instructions at home: °Eating and drinking ° °· Drink enough fluid to keep your urine clear or pale yellow. °· Eat a healthy diet that is low in sodium. Do not add salt to your food. Check nutrition labels to see how much sodium a food or beverage contains. °· Avoid caffeine. °Lifestyle °· Do not use any products that contain nicotine or tobacco, such as cigarettes and e-cigarettes. If you need help quitting, ask your health care provider. °· Do not use alcohol or drugs. °· Avoid stress as much as possible. Rest and get plenty of sleep. °General instructions °· Take over-the-counter and prescription medicines only as told by your health care provider. °· When lying down, lie on your side. This keeps pressure off of your baby. °· When sitting or lying down, raise (elevate) your feet. Try putting some pillows  underneath your lower legs. °· Exercise regularly. Ask your health care provider what kinds of exercise are best for you. °· Keep all follow-up and prenatal visits as told by your health care provider. This is important. °How is this prevented? °To prevent preeclampsia or eclampsia from developing during another pregnancy: °· Get proper medical care during pregnancy. Your health care provider may be able to prevent preeclampsia or diagnose and treat it early. °· Your health care provider may have you take a low-dose aspirin or a calcium supplement during your next pregnancy. °· You may have tests of your blood pressure and kidney function after giving birth. °· Maintain a healthy weight. Ask your health care provider for   help managing weight gain during pregnancy. °· Work with your health care provider to manage any long-term (chronic) health conditions you have, such as diabetes or kidney problems. ° °Contact a health care provider if: °· You gain more weight than expected. °· You have headaches. °· You have nausea or vomiting. °· You have abdominal pain. °· You feel dizzy or light-headed. °Get help right away if: °· You develop sudden or severe swelling anywhere in your body. This usually happens in the legs. °· You gain 5 lbs (2.3 kg) or more during one week. °· You have severe: °? Abdominal pain. °? Headaches. °? Dizziness. °? Vision problems. °? Confusion. °? Nausea or vomiting. °· You have a seizure. °· You have trouble moving any part of your body. °· You develop numbness in any part of your body. °· You have trouble speaking. °· You have any abnormal bleeding. °· You pass out. °This information is not intended to replace advice given to you by your health care provider. Make sure you discuss any questions you have with your health care provider. °Document Released: 08/31/2000 Document Revised: 05/01/2016 Document Reviewed: 04/09/2016 °Elsevier Interactive Patient Education © 2018 [employer] Inc. ° °

## 2018-03-06 NOTE — Progress Notes (Signed)
CLINICAL SOCIAL WORK MATERNAL/CHILD NOTE  Patient Details  Name: Alyssa Mathis MRN: [id number] Date of Birth: 03/03/2017  Date:  03/06/2018  Clinical Social Worker Initiating Note:  Laurey Arrow Date/Time: Initiated:  03/06/18/1517     Child's Name:  Alyssa Mathis   Biological Parents:  Mother, Father   Need for Interpreter:  None   Reason for Referral:  Other (Comment)(Client upset and unhappy with nursing staff adn NICU admission at 20 weeks. )   Address:  [place] [address]    Phone number:  [phone] (home)     Additional phone number:   Household Members/Support Persons (HM/SP):   Household Member/Support Person 1, Household Member/Support Person 2, Household Member/Support Person 3   HM/SP Name Relationship DOB or Age  HM/SP -1 La'Vion Gutzmer son 01/11/15  HM/SP -2 Da'Vion Lore son 07/11/2016  HM/SP -3 Shanon Brow [place] FOB 04/09/1966  HM/SP -4        HM/SP -5        HM/SP -6        HM/SP -7        HM/SP -8          Natural Supports (not living in the home):  (S) Immediate Family, Parent(Per MOB and FOB, FOB's family will also provide support. )   Professional Supports: None   Employment: Unemployed   Type of Work:     Education:  Nurse, adult   Homebound arranged:    Museum/gallery curator Resources:  Medicaid   Other Resources:  [facility]   Cultural/Religious Considerations Which May Impact Care:  None Reported  Strengths:  Ability to meet basic needs , Home prepared for child , Pediatrician chosen   Psychotropic Medications:         Pediatrician:    (MOB children receives care at [facility]. in [place]. )  Pediatrician List:   [facility]      Pediatrician Fax Number:    Risk Factors/Current Problems:  None   Cognitive State:  Able to Concentrate , Insightful , Linear Thinking    Mood/Affect:   Interested , Irritable , Relaxed , Comfortable    CSW Assessment: CSW met with MOB in room 320 to complete an assessment for NICU admission and nursing concerns. When CSW arrived, MOB was being attended to by bedside nurse, FOB was attending to 2 toddler boys in a double stroller, and infant was in the NICU. Initially MOB appeared irritated and hostile and after CSW explained CSW's role, MOB and FOB were polite, and receptive to meeting to CSW. FOB appeared supportive of MOB and was attentive to toddler boys during the assessment.    CSW asked about MOB's mood and MOB shared that MOB was not pleased the way that a nurse had spoken to [facility] and FOB while explaining the need to have MOB's IV removed. MOB shared that MOB is feeling better after speaking with her bedside nurse and AC. CSW apologized for any miscommunication and MOB and FOB were appreciative.   CSW also asked about MOB's thoughts and feelings about infant's NICU admission.  MOB stated, "I am feeling fine.  I have had 2 preterm babies so I know what to expect. My first baby was born at 73 weeks, and my 2nd baby was born at 63 weeks and weighted 4lbs." CSW clarified that MOB 2nd son  was born at [redacted] weeks gestational and weighed 4 pounds; MOB and FOB were adamant and shared that they understand that it's hard to believe but it's true.   CSW assessed for barrier to visiting with infant after MOB discharges. MOB communicated possible transportation barrier due to the family having to travel to and from [facility] daily to visit with infant.  CSW offered the family gas cards and explained the distrubution of the gas cards; the family was thankful. MOB and FOB denied all other psychosocial stressors.   MOB reported not having a car seat or a crib at this time and expressed confidence in obtaining a car seat prior to infant's discharge.   CSW will continue to offer resources and supports to family while infant remains in NICU.  Laurey Arrow, MSW, LCSW Clinical Social Work [phone]   CSW Plan/Description:  Psychosocial Support and Ongoing Assessment of Needs, Perinatal Mood and Anxiety Disorder (PMADs) Education, Other Patient/Family Education, Other Information/Referral to [employer] D BOYD-GILYARD, LCSW 03/06/2018, 3:25 PM

## 2020-01-30 ENCOUNTER — Emergency Department (HOSPITAL_COMMUNITY)
Admission: EM | Admit: 2020-01-30 | Discharge: 2020-01-30 | Disposition: A | Discharge status: Home / Self Care | Payer: Medicaid Other | Attending: Emergency Medicine | Admitting: Emergency Medicine

## 2020-01-30 ENCOUNTER — Encounter (HOSPITAL_COMMUNITY): Payer: Self-pay | Admitting: *Deleted

## 2020-01-30 ENCOUNTER — Other Ambulatory Visit: Payer: Self-pay

## 2020-01-30 DIAGNOSIS — I10 Essential (primary) hypertension: Secondary | ICD-10-CM | POA: Diagnosis not present

## 2020-01-30 DIAGNOSIS — Z760 Encounter for issue of repeat prescription: Secondary | ICD-10-CM | POA: Diagnosis present

## 2020-01-30 DIAGNOSIS — Z79899 Other long term (current) drug therapy: Secondary | ICD-10-CM | POA: Insufficient documentation

## 2020-01-30 MED ORDER — LABETALOL HCL 200 MG PO TABS
200.0000 mg | ORAL_TABLET | Freq: Once | ORAL | Status: AC
  Administered 2020-01-30: 200 mg via ORAL
  Filled 2020-01-30: qty 1

## 2020-01-30 MED ORDER — OXCARBAZEPINE 300 MG PO TABS: 300.0000 mg | ORAL_TABLET | Freq: Two times a day (BID) | ORAL | 0 refills | Status: DC

## 2020-01-30 MED ORDER — OXCARBAZEPINE 300 MG PO TABS
300.0000 mg | ORAL_TABLET | Freq: Two times a day (BID) | ORAL | Status: DC
  Administered 2020-01-30: 300 mg via ORAL
  Filled 2020-01-30: qty 1

## 2020-01-30 MED ORDER — HYDROXYZINE HCL 25 MG PO TABS: 25.0000 mg | ORAL_TABLET | Freq: Three times a day (TID) | ORAL | 0 refills | Status: DC | PRN

## 2020-01-30 MED ORDER — AMLODIPINE BESYLATE 5 MG PO TABS: 5.0000 mg | ORAL_TABLET | Freq: Every day | ORAL | 0 refills | Status: DC

## 2020-01-30 MED ORDER — BENZTROPINE MESYLATE 1 MG PO TABS
1.0000 mg | ORAL_TABLET | Freq: Every day | ORAL | Status: DC
  Administered 2020-01-30: 1 mg via ORAL
  Filled 2020-01-30: qty 1

## 2020-01-30 MED ORDER — LABETALOL HCL 100 MG PO TABS: 200.0000 mg | ORAL_TABLET | Freq: Two times a day (BID) | ORAL | 0 refills | Status: DC

## 2020-01-30 MED ORDER — BENZTROPINE MESYLATE 1 MG PO TABS: 1.0000 mg | ORAL_TABLET | Freq: Two times a day (BID) | ORAL | 0 refills | Status: DC

## 2020-01-30 NOTE — ED Provider Notes (Signed)
[facility] EMERGENCY DEPARTMENT Provider Note   CSN: [id number] Arrival date & time: 01/30/20  1641     History Chief Complaint  Patient presents with  . wants med refilled    Alyssa Mathis is a 34 y.o. female presented emergency department need for med refill.  The patient reports he was discharged from [facility] psychiatric facility about a week ago.  She ran out of her medications and was unable to get it refilled.  She called the hospital was told that she should go to the nearest hospital or also wait for an appointment.  Her family is concerned because her blood pressure is elevated and the feeling she needs to be in her psychiatric medicines.  Unfortunately neither the family member (sister) nor the patient know which medication she is on.  She denies headache, SI/HI.   HPI     Past Medical History:  Diagnosis Date  . Anemia   . Hypertension   . Pregnancy induced hypertension     Patient Active Problem List   Diagnosis Date Noted  . Poor compliance 03/03/2018  . Oligohydramnios antepartum, third trimester 03/03/2018  . Spontaneous vaginal delivery 03/03/2018  . Chronic hypertension with superimposed severe preeclampsia 03/01/2018    Past Surgical History:  Procedure Laterality Date  . NO PAST SURGERIES       OB History    Gravida  5   Para  3   Term  1   Preterm  2   AB  2   Living  3     SAB  2   TAB      Ectopic      Multiple  0   Live Births  3           Family History  Problem Relation Age of Onset  . Hypertension Sister   . Hypertension Maternal Grandmother   . Diabetes Maternal Grandfather   . Hypertension Maternal Grandfather     Social History   Tobacco Use  . Smoking status: Never Smoker  . Smokeless tobacco: Never Used  Substance Use Topics  . Alcohol use: Never  . Drug use: Not on file    Home Medications Prior to Admission medications   Medication Sig Start Date End Date Taking? Authorizing  Provider  amLODipine (NORVASC) 5 MG tablet Take 5 mg by mouth daily. 09/24/19  Yes [provider]  benztropine (COGENTIN) 1 MG tablet Take 1 mg by mouth 2 (two) times daily. 10/15/19  Yes [provider]  hydrOXYzine (ATARAX/VISTARIL) 25 MG tablet Take 50 mg by mouth every 8 (eight) hours as needed. 10/15/19  Yes [provider]  labetalol (NORMODYNE) 200 MG tablet Take 200 mg by mouth 2 (two) times daily. 09/24/19  Yes [provider]  Oxcarbazepine (TRILEPTAL) 300 MG tablet Take 300 mg by mouth 2 (two) times daily. 09/24/19  Yes [provider]  amLODipine (NORVASC) 5 MG tablet Take 1 tablet (5 mg total) by mouth daily for 30 doses. 01/30/20 02/29/20  Terald Sleeper, MD  benztropine (COGENTIN) 1 MG tablet Take 1 tablet (1 mg total) by mouth 2 (two) times daily. 01/30/20 02/29/20  Terald Sleeper, MD  Ferrous Sulfate (IRON) 325 (65 Fe) MG TABS TAKE ONE TABLET BY MOUTH TWICE DAILY WITH  A  MEAL Patient not taking: Reported on 03/01/2018 07/25/16   Constant, Peggy, MD  hydrOXYzine (ATARAX/VISTARIL) 25 MG tablet Take 1 tablet (25 mg total) by mouth every 8 (eight) hours as  needed for up to 15 doses for anxiety. 01/30/20   Wyvonnia Dusky, MD  ibuprofen (ADVIL,MOTRIN) 600 MG tablet Take 1 tablet (600 mg total) by mouth every 6 (six) hours as needed for moderate pain or cramping. Patient not taking: Reported on 01/30/2020 03/05/18   Osborne Oman, MD  labetalol (NORMODYNE) 100 MG tablet Take 2 tablets (200 mg total) by mouth 2 (two) times daily. 01/30/20 02/29/20  Wyvonnia Dusky, MD  Oxcarbazepine (TRILEPTAL) 300 MG tablet Take 1 tablet (300 mg total) by mouth 2 (two) times daily. 01/30/20 02/29/20  Wyvonnia Dusky, MD  Prenatal Vit-Fe Fumarate-FA (PRENATAL MULTIVITAMIN) TABS tablet Take 1 tablet by mouth daily. Patient not taking: Reported on 01/30/2020 03/05/18   Osborne Oman, MD    Allergies    Nifedipine  Review of Systems   Review of Systems    Constitutional: Negative for chills and fever.  Cardiovascular: Negative for chest pain.  Skin: Negative for color change and rash.  Neurological: Negative for syncope, light-headedness and headaches.  Psychiatric/Behavioral: Negative for self-injury and sleep disturbance.  All other systems reviewed and are negative.   Physical Exam Updated Vital Signs BP (!) 180/132 (BP Location: Right Arm)   Pulse 89   Temp 99 F (37.2 C) (Oral)   Resp 18   Ht 5\' 3"  (1.6 m)   Wt 63.5 kg   SpO2 100%   BMI 24.80 kg/m   Physical Exam Vitals and nursing note reviewed.  Constitutional:      General: She is not in acute distress.    Appearance: She is well-developed.  HENT:     Head: Normocephalic and atraumatic.  Eyes:     Conjunctiva/sclera: Conjunctivae normal.  Cardiovascular:     Rate and Rhythm: Normal rate and regular rhythm.     Pulses: Normal pulses.     Heart sounds: No murmur.  Pulmonary:     Effort: Pulmonary effort is normal. No respiratory distress.  Musculoskeletal:     Cervical back: Neck supple.  Skin:    General: Skin is warm and dry.  Neurological:     General: No focal deficit present.     Mental Status: She is alert and oriented to person, place, and time.     ED Results / Procedures / Treatments   Labs (all labs ordered are listed, but only abnormal results are displayed) Labs Reviewed - No data to display  EKG None  Radiology No results found.  Procedures Procedures (including critical care time)  Medications Ordered in ED Medications  Oxcarbazepine (TRILEPTAL) tablet 300 mg (300 mg Oral Given 01/30/20 1956)  benztropine (COGENTIN) tablet 1 mg (1 mg Oral Given 01/30/20 1956)  labetalol (NORMODYNE) tablet 200 mg (200 mg Oral Given 01/30/20 1956)    ED Course  I have reviewed the triage vital signs and the nursing notes.  Pertinent labs & imaging results that were available during my care of the patient were reviewed by me and considered in my  medical decision making (see chart for details).  34 yo female presenting to ED for med refill Hypertensive here but otherwise asymptomatic Well appearing on exam. Sister present at bedside, reporting no one knows what her meds are, discharged from [facility] 1-2 weeks ago.  [facility] pharmacy in [place], [place] is closed now unfortunately.  I'll talk to our pharmacy staff to see if they can help with the med rec  *  Update pharmacist able to provide accurate med rec.  Filled 30  day script.    Final Clinical Impression(s) / ED Diagnoses Final diagnoses:  Medication refill    Rx / DC Orders ED Discharge Orders         Ordered    amLODipine (NORVASC) 5 MG tablet  Daily     01/30/20 1952    benztropine (COGENTIN) 1 MG tablet  2 times daily     01/30/20 1952    hydrOXYzine (ATARAX/VISTARIL) 25 MG tablet  Every 8 hours PRN     01/30/20 1952    labetalol (NORMODYNE) 100 MG tablet  2 times daily     01/30/20 1952    Oxcarbazepine (TRILEPTAL) 300 MG tablet  2 times daily     01/30/20 1952           Terald Sleeper, MD 01/30/20 2055

## 2020-01-30 NOTE — ED Triage Notes (Signed)
The pt was called  When I went ot get her there was a female with her that reported that she had to go back with here when I reported only the pt  The pt would not come back  stil in lobby

## 2020-01-30 NOTE — ED Notes (Signed)
Patient verbalizes understanding of discharge instructions. Opportunity for questioning and answers were provided. Armband removed by staff, pt discharged from ED. Pt. ambulatory and discharged home.  

## 2020-01-30 NOTE — ED Triage Notes (Signed)
The pt is here for a med refill  She is here with her sister  [place] [facility] [place]  Her mental health meds ran out and she was unable to get it refilled without seeing a doctor.  Shes here for a refill  Pt not communicating about much of this  shes allergic to some psy med but she does not know the name of it  lmp unknown

## 2020-01-30 NOTE — Discharge Instructions (Addendum)
Please follow up with your pyschiatrist or therapist.  I prescribed you a 1 month supply of your medications, but these should be refilled by your prescribing doctor, not the emergency department.

## 2020-03-16 ENCOUNTER — Emergency Department
Admission: EM | Admit: 2020-03-16 | Discharge: 2020-03-16 | Disposition: A | Discharge status: Home / Self Care | Payer: Medicaid Other | Attending: Emergency Medicine | Admitting: Emergency Medicine

## 2020-03-16 ENCOUNTER — Encounter: Payer: Self-pay | Admitting: Emergency Medicine

## 2020-03-16 ENCOUNTER — Emergency Department: Payer: Medicaid Other

## 2020-03-16 ENCOUNTER — Other Ambulatory Visit: Payer: Self-pay

## 2020-03-16 DIAGNOSIS — I10 Essential (primary) hypertension: Secondary | ICD-10-CM | POA: Diagnosis not present

## 2020-03-16 DIAGNOSIS — Z79899 Other long term (current) drug therapy: Secondary | ICD-10-CM | POA: Insufficient documentation

## 2020-03-16 DIAGNOSIS — K047 Periapical abscess without sinus: Secondary | ICD-10-CM | POA: Diagnosis not present

## 2020-03-16 DIAGNOSIS — K0889 Other specified disorders of teeth and supporting structures: Secondary | ICD-10-CM | POA: Diagnosis present

## 2020-03-16 LAB — CBC WITH DIFFERENTIAL/PLATELET
Abs Immature Granulocytes: 0.02 10*3/uL (ref 0.00–0.07)
Basophils Absolute: 0 10*3/uL (ref 0.0–0.1)
Basophils Relative: 0 %
Eosinophils Absolute: 0 10*3/uL (ref 0.0–0.5)
Eosinophils Relative: 1 %
HCT: 41.2 % (ref 36.0–46.0)
Hemoglobin: 14.4 g/dL (ref 12.0–15.0)
Immature Granulocytes: 0 %
Lymphocytes Relative: 25 %
Lymphs Abs: 1.7 10*3/uL (ref 0.7–4.0)
MCH: 30.7 pg (ref 26.0–34.0)
MCHC: 35 g/dL (ref 30.0–36.0)
MCV: 87.8 fL (ref 80.0–100.0)
Monocytes Absolute: 0.6 10*3/uL (ref 0.1–1.0)
Monocytes Relative: 9 %
Neutro Abs: 4.3 10*3/uL (ref 1.7–7.7)
Neutrophils Relative %: 65 %
Platelets: 215 10*3/uL (ref 150–400)
RBC: 4.69 MIL/uL (ref 3.87–5.11)
RDW: 11.6 % (ref 11.5–15.5)
WBC: 6.7 10*3/uL (ref 4.0–10.5)
nRBC: 0 % (ref 0.0–0.2)

## 2020-03-16 LAB — COMPREHENSIVE METABOLIC PANEL
ALT: 13 U/L (ref 0–44)
AST: 16 U/L (ref 15–41)
Albumin: 4.4 g/dL (ref 3.5–5.0)
Alkaline Phosphatase: 75 U/L (ref 38–126)
Anion gap: 11 (ref 5–15)
BUN: 9 mg/dL (ref 6–20)
CO2: 25 mmol/L (ref 22–32)
Calcium: 9.4 mg/dL (ref 8.9–10.3)
Chloride: 104 mmol/L (ref 98–111)
Creatinine, Ser: 0.93 mg/dL (ref 0.44–1.00)
GFR calc Af Amer: >60 mL/min (ref >60)
GFR calc non Af Amer: >60 mL/min (ref >60)
Glucose, Bld: 92 mg/dL (ref 70–99)
Potassium: 3.9 mmol/L (ref 3.5–5.1)
Sodium: 140 mmol/L (ref 135–145)
Total Bilirubin: 0.9 mg/dL (ref 0.3–1.2)
Total Protein: 8.3 g/dL — ABNORMAL HIGH (ref 6.5–8.1)

## 2020-03-16 MED ORDER — CLINDAMYCIN HCL 300 MG PO CAPS: 300.0000 mg | ORAL_CAPSULE | Freq: Three times a day (TID) | ORAL | 0 refills | Status: AC

## 2020-03-16 MED ORDER — IOHEXOL 300 MG/ML  SOLN
75.0000 mL | Freq: Once | INTRAMUSCULAR | Status: AC | PRN
  Administered 2020-03-16: 75 mL via INTRAVENOUS
  Filled 2020-03-16: qty 75

## 2020-03-16 MED ORDER — CLINDAMYCIN PHOSPHATE 600 MG/50ML IV SOLN
600.0000 mg | Freq: Once | INTRAVENOUS | Status: AC
  Administered 2020-03-16: 600 mg via INTRAVENOUS
  Filled 2020-03-16: qty 50

## 2020-03-16 MED ORDER — DEXAMETHASONE SODIUM PHOSPHATE 10 MG/ML IJ SOLN
10.0000 mg | Freq: Once | INTRAMUSCULAR | Status: AC
  Administered 2020-03-16: 10 mg via INTRAVENOUS
  Filled 2020-03-16: qty 1

## 2020-03-16 NOTE — ED Provider Notes (Signed)
Emergency Department Provider Note  ____________________________________________  Time seen: Approximately 6:30 PM  I have reviewed the triage vital signs and the nursing notes.   HISTORY  Chief Complaint Dental Pain   Historian Patient     HPI Alyssa Mathis is a 34 y.o. female presents to the emergency department left-sided lower dental pain surrounding inferior 22.  Patient states that she has had some swelling along the left side of her neck and has had some difficulty and pain with swallowing.  She is managing her own secretions in the ED and is able to speak in complete sentences.  She states that inferior 22 has been broken for several years and she does not currently have an appointment with a local dentist.  She has noticed some swelling along the left lower jaw.  There has been no pain underneath the tongue.  No other alleviating measures have been attempted.   Past Medical History:  Diagnosis Date   Anemia    Hypertension    Pregnancy induced hypertension      Immunizations up to date:  Yes.     Past Medical History:  Diagnosis Date   Anemia    Hypertension    Pregnancy induced hypertension     Patient Active Problem List   Diagnosis Date Noted   Poor compliance 03/03/2018   Oligohydramnios antepartum, third trimester 03/03/2018   Spontaneous vaginal delivery 03/03/2018   Chronic hypertension with superimposed severe preeclampsia 03/01/2018    Past Surgical History:  Procedure Laterality Date   NO PAST SURGERIES      Prior to Admission medications   Medication Sig Start Date End Date Taking? Authorizing Provider  amLODipine (NORVASC) 5 MG tablet Take 5 mg by mouth daily. 09/24/19   [provider]  benztropine (COGENTIN) 1 MG tablet Take 1 mg by mouth 2 (two) times daily. 10/15/19   [provider]  clindamycin (CLEOCIN) 300 MG capsule Take 1 capsule (300 mg total) by mouth 3 (three) times daily for 10 days. 03/16/20  03/26/20  Orvil Feil, PA-C  hydrOXYzine (ATARAX/VISTARIL) 25 MG tablet Take 50 mg by mouth every 8 (eight) hours as needed. 10/15/19   [provider]  hydrOXYzine (ATARAX/VISTARIL) 25 MG tablet Take 1 tablet (25 mg total) by mouth every 8 (eight) hours as needed for up to 15 doses for anxiety. 01/30/20   Terald Sleeper, MD  labetalol (NORMODYNE) 200 MG tablet Take 200 mg by mouth 2 (two) times daily. 09/24/19   [provider]  Oxcarbazepine (TRILEPTAL) 300 MG tablet Take 300 mg by mouth 2 (two) times daily. 09/24/19   [provider]    Allergies Nifedipine  Family History  Problem Relation Age of Onset   Hypertension Sister    Hypertension Maternal Grandmother    Diabetes Maternal Grandfather    Hypertension Maternal Grandfather     Social History Social History   Tobacco Use   Smoking status: Never Smoker   Smokeless tobacco: Never Used  Substance Use Topics   Alcohol use: Never   Drug use: Not on file     Review of Systems  Constitutional: No fever/chills Eyes:  No discharge ENT: Patient has dental pain.  Respiratory: no cough. No SOB/ use of accessory muscles to breath Gastrointestinal:   No nausea, no vomiting.  No diarrhea.  No constipation. Musculoskeletal: Negative for musculoskeletal pain. Skin: Negative for rash, abrasions, lacerations, ecchymosis.   ____________________________________________   PHYSICAL EXAM:  VITAL SIGNS: ED Triage Vitals  Enc  Vitals Group     BP 03/16/20 1635 (!) 168/122     Pulse Rate 03/16/20 1635 95     Resp 03/16/20 1635 16     Temp 03/16/20 1635 98.9 F (37.2 C)     Temp Source 03/16/20 1635 Axillary     SpO2 03/16/20 1635 97 %     Weight 03/16/20 1634 135 lb (61.2 kg)     Height 03/16/20 1634 5\' 5"  (1.651 m)     Head Circumference --      Peak Flow --      Pain Score 03/16/20 1639 10     Pain Loc --      Pain Edu? --      Excl. in GC? --      Constitutional: Alert and oriented.  Well appearing and in no acute distress. Eyes: Conjunctivae are normal. PERRL. EOMI. Head: Atraumatic. ENT:      Nose: No congestion/rhinnorhea.      Mouth/Throat: Mucous membranes are moist.  Patient has inferior 22 pain and mild swelling along the left lower jaw.  No pain to palpation underneath the tongue. Neck: No stridor.  No cervical spine tenderness to palpation. Cardiovascular: Normal rate, regular rhythm. Normal S1 and S2.  Good peripheral circulation. Respiratory: Normal respiratory effort without tachypnea or retractions. Lungs CTAB. Good air entry to the bases with no decreased or absent breath sounds Skin:  Skin is warm, dry and intact. No rash noted. Psychiatric: Mood and affect are normal for age. Speech and behavior are normal.   ____________________________________________   LABS (all labs ordered are listed, but only abnormal results are displayed)  Labs Reviewed  COMPREHENSIVE METABOLIC PANEL - Abnormal; Notable for the following components:      Result Value   Total Protein 8.3 (*)    All other components within normal limits  CBC WITH DIFFERENTIAL/PLATELET   ____________________________________________  EKG   ____________________________________________  RADIOLOGY 03/18/20, personally viewed and evaluated these images (plain radiographs) as part of my medical decision making, as well as reviewing the written report by the radiologist.  CT Soft Tissue Neck W Contrast  Result Date: 03/16/2020 CLINICAL DATA:  Facial cellulitis. Left-sided swelling. Left lower dental pain. EXAM: CT NECK WITH CONTRAST TECHNIQUE: Multidetector CT imaging of the neck was performed using the standard protocol following the bolus administration of intravenous contrast. CONTRAST:  52mL OMNIPAQUE IOHEXOL 300 MG/ML  SOLN COMPARISON:  None. FINDINGS: Pharynx and larynx: Normal. No mass or swelling. Salivary glands: No inflammation, mass, or stone. Thyroid: Negative Lymph nodes: No  enlarged lymph nodes in the neck. 1 cm level 2 lymph nodes bilaterally within normal limits for size. Vascular: Normal vascular enhancement. Limited intracranial: Negative Visualized orbits: Negative Mastoids and visualized paranasal sinuses: Mild mucosal edema maxillary sinus bilaterally. Remaining sinuses clear. Skeleton: Multiple dental caries. Periapical lucency and caries around left lower second molar. Upper chest: Lung apices clear bilaterally. Other: Mild soft tissue swelling lateral to the mandible on the left. No abscess identified. IMPRESSION: Poor dentition with multiple caries. Caries and periapical lucency around left lower second molar. There is soft tissue swelling lateral to this. No soft tissue abscess. Electronically Signed   By: 73m M.D.   On: 03/16/2020 18:09    ____________________________________________    PROCEDURES  Procedure(s) performed:     Procedures     Medications  clindamycin (CLEOCIN) IVPB 600 mg ( Intravenous Stopped 03/16/20 1746)  dexamethasone (DECADRON) injection 10 mg (10 mg Intravenous Given  03/16/20 1713)  iohexol (OMNIPAQUE) 300 MG/ML solution 75 mL (75 mLs Intravenous Contrast Given 03/16/20 1751)     ____________________________________________   INITIAL IMPRESSION / ASSESSMENT AND PLAN / ED COURSE  Pertinent labs & imaging results that were available during my care of the patient were reviewed by me and considered in my medical decision making (see chart for details).      Assessment and Plan: Dental pain:  33 year old female presents to the emergency department with dental pain surrounding inferior 22 and swelling along the left anterior neck.  Patient was hypertensive at triage but vital signs were otherwise reassuring.  On physical exam, inferior 22 seem to be affected by dental caries.  CT soft tissue neck revealed no evidence of abscess.  CBC and CMP were reassuring.  Patient was given IV clindamycin in the emergency  department.  She was discharged with clindamycin.  She was advised to make an appointment with a local dentist as soon as possible.  ____________________________________________  FINAL CLINICAL IMPRESSION(S) / ED DIAGNOSES  Final diagnoses:  Dental abscess      NEW MEDICATIONS STARTED DURING THIS VISIT:  ED Discharge Orders         Ordered    clindamycin (CLEOCIN) 300 MG capsule  3 times daily     Discontinue  Reprint     03/16/20 1848              This chart was dictated using voice recognition software/Dragon. Despite best efforts to proofread, errors can occur which can change the meaning. Any change was purely unintentional.     Gasper Lloyd 03/16/20 2035    Arnaldo Natal, MD 03/17/20 [phone]

## 2020-03-16 NOTE — ED Triage Notes (Signed)
Presents with left lower dental pain  States she has had a broken tooth for several years  Developed increased pain with some swelling yesterday

## 2020-03-16 NOTE — Discharge Instructions (Signed)
Take Clindamycin three times daily for the next ten days.  

## 2020-05-11 ENCOUNTER — Other Ambulatory Visit: Payer: Self-pay

## 2020-05-11 ENCOUNTER — Emergency Department: Payer: Medicaid Other

## 2020-05-11 ENCOUNTER — Emergency Department
Admission: EM | Admit: 2020-05-11 | Discharge: 2020-05-11 | Disposition: A | Discharge status: Home / Self Care | Payer: Medicaid Other | Attending: Emergency Medicine | Admitting: Emergency Medicine

## 2020-05-11 DIAGNOSIS — O4691 Antepartum hemorrhage, unspecified, first trimester: Secondary | ICD-10-CM | POA: Insufficient documentation

## 2020-05-11 DIAGNOSIS — O26891 Other specified pregnancy related conditions, first trimester: Secondary | ICD-10-CM | POA: Insufficient documentation

## 2020-05-11 DIAGNOSIS — O99281 Endocrine, nutritional and metabolic diseases complicating pregnancy, first trimester: Secondary | ICD-10-CM | POA: Insufficient documentation

## 2020-05-11 DIAGNOSIS — O219 Vomiting of pregnancy, unspecified: Secondary | ICD-10-CM

## 2020-05-11 DIAGNOSIS — R109 Unspecified abdominal pain: Secondary | ICD-10-CM | POA: Diagnosis not present

## 2020-05-11 DIAGNOSIS — Z3A01 Less than 8 weeks gestation of pregnancy: Secondary | ICD-10-CM

## 2020-05-11 DIAGNOSIS — IMO0001 Reserved for inherently not codable concepts without codable children: Secondary | ICD-10-CM

## 2020-05-11 DIAGNOSIS — E860 Dehydration: Secondary | ICD-10-CM | POA: Diagnosis not present

## 2020-05-11 DIAGNOSIS — E876 Hypokalemia: Secondary | ICD-10-CM | POA: Diagnosis not present

## 2020-05-11 DIAGNOSIS — I10 Essential (primary) hypertension: Secondary | ICD-10-CM | POA: Diagnosis not present

## 2020-05-11 DIAGNOSIS — Z79899 Other long term (current) drug therapy: Secondary | ICD-10-CM | POA: Diagnosis not present

## 2020-05-11 DIAGNOSIS — O468X1 Other antepartum hemorrhage, first trimester: Secondary | ICD-10-CM

## 2020-05-11 DIAGNOSIS — R112 Nausea with vomiting, unspecified: Secondary | ICD-10-CM

## 2020-05-11 DIAGNOSIS — O26899 Other specified pregnancy related conditions, unspecified trimester: Secondary | ICD-10-CM

## 2020-05-11 LAB — HCG, QUANTITATIVE, PREGNANCY: hCG, Beta Chain, Quant, S: 58678 m[IU]/mL — ABNORMAL HIGH (ref <5)

## 2020-05-11 LAB — CBC
HCT: 37 % (ref 36.0–46.0)
Hemoglobin: 13.2 g/dL (ref 12.0–15.0)
MCH: 31.1 pg (ref 26.0–34.0)
MCHC: 35.7 g/dL (ref 30.0–36.0)
MCV: 87.1 fL (ref 80.0–100.0)
Platelets: 233 10*3/uL (ref 150–400)
RBC: 4.25 MIL/uL (ref 3.87–5.11)
RDW: 11.4 % — ABNORMAL LOW (ref 11.5–15.5)
WBC: 5 10*3/uL (ref 4.0–10.5)
nRBC: 0 % (ref 0.0–0.2)

## 2020-05-11 LAB — LIPASE, BLOOD: Lipase: 29 U/L (ref 11–51)

## 2020-05-11 LAB — COMPREHENSIVE METABOLIC PANEL
ALT: 12 U/L (ref 0–44)
AST: 15 U/L (ref 15–41)
Albumin: 4.4 g/dL (ref 3.5–5.0)
Alkaline Phosphatase: 57 U/L (ref 38–126)
Anion gap: 10 (ref 5–15)
BUN: 13 mg/dL (ref 6–20)
CO2: 24 mmol/L (ref 22–32)
Calcium: 9.5 mg/dL (ref 8.9–10.3)
Chloride: 104 mmol/L (ref 98–111)
Creatinine, Ser: 0.86 mg/dL (ref 0.44–1.00)
GFR calc Af Amer: >60 mL/min (ref >60)
GFR calc non Af Amer: >60 mL/min (ref >60)
Glucose, Bld: 100 mg/dL — ABNORMAL HIGH (ref 70–99)
Potassium: 3.3 mmol/L — ABNORMAL LOW (ref 3.5–5.1)
Sodium: 138 mmol/L (ref 135–145)
Total Bilirubin: 1.2 mg/dL (ref 0.3–1.2)
Total Protein: 7.7 g/dL (ref 6.5–8.1)

## 2020-05-11 LAB — URINALYSIS, COMPLETE (UACMP) WITH MICROSCOPIC
Bacteria, UA: NONE SEEN
Bilirubin Urine: NEGATIVE
Glucose, UA: NEGATIVE mg/dL
Hgb urine dipstick: NEGATIVE
Ketones, ur: 80 mg/dL — AB
Leukocytes,Ua: NEGATIVE
Nitrite: NEGATIVE
Protein, ur: 100 mg/dL — AB
Specific Gravity, Urine: 1.033 — ABNORMAL HIGH (ref 1.005–1.030)
pH: 5 (ref 5.0–8.0)

## 2020-05-11 LAB — POC URINE PREG, ED: Preg Test, Ur: POSITIVE — AB

## 2020-05-11 MED ORDER — POTASSIUM CHLORIDE CRYS ER 20 MEQ PO TBCR
40.0000 meq | EXTENDED_RELEASE_TABLET | Freq: Once | ORAL | Status: AC
  Administered 2020-05-11: 40 meq via ORAL
  Filled 2020-05-11: qty 2

## 2020-05-11 MED ORDER — LACTATED RINGERS IV BOLUS
1000.0000 mL | Freq: Once | INTRAVENOUS | Status: AC
  Administered 2020-05-11: 1000 mL via INTRAVENOUS

## 2020-05-11 MED ORDER — METOCLOPRAMIDE HCL 10 MG PO TABS: 10.0000 mg | ORAL_TABLET | Freq: Three times a day (TID) | ORAL | 0 refills | Status: DC | PRN

## 2020-05-11 MED ORDER — METOCLOPRAMIDE HCL 5 MG/ML IJ SOLN
10.0000 mg | Freq: Once | INTRAMUSCULAR | Status: AC
  Administered 2020-05-11: 10 mg via INTRAVENOUS
  Filled 2020-05-11: qty 2

## 2020-05-11 NOTE — ED Notes (Signed)
Patient declined discharge vital signs. 

## 2020-05-11 NOTE — Discharge Instructions (Signed)
You were seen in the ED because of your nausea and abdominal pain.  You are about 6 weeks and 5 days gestation into this pregnancy. The ultrasound showed an appropriately placed fetus within the uterus, as we discussed. There was also signs of a very small subchorionic hemorrhage: This is the tiny spot of bleeding between the uterus and placenta that we talked about.  Please follow-up with your OB/GYN.  Of included the phone number for one of our local OB/GYN physicians that you can call to be seen within their practice.  Take Reglan as needed for nausea.  This prescription should be waiting for you at the [facility] here in [place].  You can take up to 3-4 times per day.  And this is safe for the baby.  If you develop any severely worsening abdominal pain, vaginal bleeding or passage of clots, or febrile illnesses, please return to the ED.

## 2020-05-11 NOTE — ED Triage Notes (Signed)
Pt presents via POV with c/o nausea and abdominal pain that has been ongoing for 3 weeks. Pt cannot remember last period date, but does not remember having one. Pt states abdominal pain is cramp like.

## 2020-05-11 NOTE — ED Provider Notes (Signed)
[facility] Emergency Department Provider Note ____________________________________________   First MD Initiated Contact with Patient 05/11/20 1712     (approximate)  I have reviewed the triage vital signs and the nursing notes.  HISTORY  Chief Complaint Nausea   HPI Alyssa Mathis is a 34 y.o. femalewho presents to the ED for evaluation of nausea.  Chart review indicates history of hypertension and anxiety.  G3, P2.  Patient reports previously receiving Depo-Provera injections, and she cannot recall when her last injection was "maybe in the spring."   Patient ports 3 weeks of persistent nausea is progressed to couple episodes of nonbloody nonbilious emesis for the past 2 days.  She reports emesis primarily in the mornings.  2-4 episodes per day.  Patient reports associated mild abdominal cramping that is diffuse, 3/10 intensity and intermittent, lasting a matter of minutes-hours before self resolving.  Patient denies any vaginal bleeding, new vaginal discharge, dysuria, diarrhea, fever, syncope, shortness of breath or chest pain.  Denies trauma.    Past Medical History:  Diagnosis Date  . Anemia   . Hypertension   . Pregnancy induced hypertension     Patient Active Problem List   Diagnosis Date Noted  . Poor compliance 03/03/2018  . Oligohydramnios antepartum, third trimester 03/03/2018  . Spontaneous vaginal delivery 03/03/2018  . Chronic hypertension with superimposed severe preeclampsia 03/01/2018    Past Surgical History:  Procedure Laterality Date  . NO PAST SURGERIES      Prior to Admission medications   Medication Sig Start Date End Date Taking? Authorizing Provider  amLODipine (NORVASC) 5 MG tablet Take 5 mg by mouth daily. 09/24/19   [provider]  benztropine (COGENTIN) 1 MG tablet Take 1 mg by mouth 2 (two) times daily. 10/15/19   [provider]  hydrOXYzine (ATARAX/VISTARIL) 25 MG tablet Take 50 mg by mouth  every 8 (eight) hours as needed. 10/15/19   [provider]  hydrOXYzine (ATARAX/VISTARIL) 25 MG tablet Take 1 tablet (25 mg total) by mouth every 8 (eight) hours as needed for up to 15 doses for anxiety. 01/30/20   Terald Sleeper, MD  labetalol (NORMODYNE) 200 MG tablet Take 200 mg by mouth 2 (two) times daily. 09/24/19   [provider]  metoCLOPramide (REGLAN) 10 MG tablet Take 1 tablet (10 mg total) by mouth every 8 (eight) hours as needed for nausea. 05/11/20 05/11/21  Delton Prairie, MD  Oxcarbazepine (TRILEPTAL) 300 MG tablet Take 300 mg by mouth 2 (two) times daily. 09/24/19   [provider]    Allergies Nifedipine  Family History  Problem Relation Age of Onset  . Hypertension Sister   . Hypertension Maternal Grandmother   . Diabetes Maternal Grandfather   . Hypertension Maternal Grandfather     Social History Social History   Tobacco Use  . Smoking status: Never Smoker  . Smokeless tobacco: Never Used  Substance Use Topics  . Alcohol use: Never  . Drug use: Not on file    Review of Systems  Constitutional: No fever/chills Eyes: No visual changes. ENT: No sore throat. Cardiovascular: Denies chest pain. Respiratory: Denies shortness of breath. Gastrointestinal: Positive for abdominal pain and nausea.  No vomiting.  No diarrhea.  No constipation. Genitourinary: Negative for dysuria. Musculoskeletal: Negative for back pain. Skin: Negative for rash. Neurological: Negative for headaches, focal weakness or numbness.   ____________________________________________   PHYSICAL EXAM:  VITAL SIGNS: Vitals:   05/11/20 1332  BP: 117/83  Pulse: 75  Resp:  17  Temp: 97.7 F (36.5 C)  SpO2: 99%      Constitutional: Alert and oriented. Well appearing and in no acute distress. Eyes: Conjunctivae are normal. PERRL. EOMI. Head: Atraumatic. Nose: No congestion/rhinnorhea. Mouth/Throat: Mucous membranes are dry.  Oropharynx non-erythematous. Neck:  No stridor. No cervical spine tenderness to palpation. Cardiovascular: Normal rate, regular rhythm. Grossly normal heart sounds.  Good peripheral circulation. Respiratory: Normal respiratory effort.  No retractions. Lungs CTAB. Gastrointestinal: Soft , nondistended, nontender to palpation. No abdominal bruits. No CVA tenderness. Musculoskeletal: No lower extremity tenderness nor edema.  No joint effusions. No signs of acute trauma. Neurologic:  Normal speech and language. No gross focal neurologic deficits are appreciated. No gait instability noted. Skin:  Skin is warm, dry and intact. No rash noted. Psychiatric: Mood and affect are normal. Speech and behavior are normal.  ____________________________________________   LABS (all labs ordered are listed, but only abnormal results are displayed)  Labs Reviewed  COMPREHENSIVE METABOLIC PANEL - Abnormal; Notable for the following components:      Result Value   Potassium 3.3 (*)    Glucose, Bld 100 (*)    All other components within normal limits  CBC - Abnormal; Notable for the following components:   RDW 11.4 (*)    All other components within normal limits  URINALYSIS, COMPLETE (UACMP) WITH MICROSCOPIC - Abnormal; Notable for the following components:   Color, Urine AMBER (*)    APPearance CLOUDY (*)    Specific Gravity, Urine 1.033 (*)    Ketones, ur 80 (*)    Protein, ur 100 (*)    All other components within normal limits  HCG, QUANTITATIVE, PREGNANCY - Abnormal; Notable for the following components:   hCG, Beta Chain, Quant, S 93,790 (*)    All other components within normal limits  POC URINE PREG, ED - Abnormal; Notable for the following components:   Preg Test, Ur Positive (*)    All other components within normal limits  LIPASE, BLOOD   ____________________________________________  RADIOLOGY  ED MD interpretation: IUP in place  Official radiology report(s): US OB LESS THAN 14 WEEKS WITH OB TRANSVAGINAL  Result  Date: 05/11/2020 CLINICAL DATA:  Abdominal pain, nausea, vomiting EXAM: OBSTETRIC <14 WK [name] AND TRANSVAGINAL OB US TECHNIQUE: Both transabdominal and transvaginal ultrasound examinations were performed for complete evaluation of the gestation as well as the maternal uterus, adnexal regions, and pelvic cul-de-sac. Transvaginal technique was performed to assess early pregnancy. COMPARISON:  None. FINDINGS: Intrauterine gestational sac: Present, single Yolk sac:  Present, single, normal-appearing Embryo:  Present, single Cardiac Activity: Present, regular Heart Rate: 140 bpm MSD: Appropriate for fetal size CRL:  7.8 mm   6 w   5 d                  [name] EDC: 12/30/2020 Subchorionic hemorrhage: A very small subchorionic hemorrhage is identified, best seen on image # [id number] Maternal uterus/adnexae: The uterus is anteverted. The uterus measures 10 cm in greatest length. No intrauterine masses are seen. Placenta is not well visualized given the early phase of gestation. The cervix is closed and is unremarkable. No free fluid is seen within the cul-de-sac. The maternal ovaries are normal in size and echogenicity and demonstrate normal color flow vascularity. Corpus luteum noted within the left ovary. IMPRESSION: Single living intrauterine gestation with an estimated gestational age of [redacted] weeks, 5 days. Very small subchorionic hemorrhage. Electronically Signed   By: Helyn Numbers MD   On: 05/11/2020  18:58    ____________________________________________   PROCEDURES and INTERVENTIONS  Procedure(s) performed (including Critical Care):  Procedures  Medications  potassium chloride SA (KLOR-CON) CR tablet 40 mEq (40 mEq Oral Given 05/11/20 1746)  lactated ringers bolus 1,000 mL (1,000 mLs Intravenous New Bag/Given 05/11/20 1746)  lactated ringers bolus 1,000 mL (1,000 mLs Intravenous New Bag/Given 05/11/20 1746)  metoCLOPramide (REGLAN) injection 10 mg (10 mg Intravenous Given 05/11/20 1745)     ____________________________________________   MDM / ED COURSE  G66, P47 34 year old female presents with subacute nausea and vomiting, most consistent with early gestational pregnancy, found to have evidence of a small subchorionic hemorrhage, and ultimately amenable to outpatient management.  Normal vital signs on room air.  Reassuring examination with a benign abdomen, and is most significant for dry mucous membranes to suggest a degree of dehydration.  She has no evidence of distress, trauma.  Blood work demonstrates mild hypokalemia, that was repleted orally.  Appropriately elevated beta hCG in the setting of early gestation.  Urine with elevated specific gravity and ketones to further suggest dehydration, and without evidence of infectious features.  Fluid resuscitated the patient with 2 L of LR, and single dose of Reglan, with resolution of her symptoms.  TVUS demonstrates appropriately placed IUP at about [redacted] weeks gestation, and does note a small subchorionic hemorrhage.  Patient symptoms are well controlled and she has no significant abdominal pain here in the ED, and she denies any vaginal bleeding.  Provided patient follow-up information for OB/GYN and we discussed outpatient management.  Return precautions for the ED were discussed.  Patient medically stable for discharge home.  Clinical Course as of May 12 1923  Wed May 11, 2020  1804 Reassessed.  Patient reports improving nausea after Reglan administration.  Educated patient on hypokalemia   [DS]  1822 Patient taken to ultrasound   [DS]  1923 Reassessed.  Patient reports feeling well.  Discussed early pregnancy and subchorionic hemorrhage with patient and her husband at the bedside.  Answered questions.  Urged close outpatient follow-up with OB/GYN and we discussed return precautions for the ED.   [DS]    Clinical Course User Index [DS] Delton Prairie, MD     ____________________________________________   FINAL CLINICAL  IMPRESSION(S) / ED DIAGNOSES  Final diagnoses:  Hypokalemia  Dehydration  Non-intractable vomiting with nausea, unspecified vomiting type  Less than [redacted] weeks gestation of pregnancy  Subchorionic hemorrhage of placenta in first trimester, single or unspecified fetus     ED Discharge Orders         Ordered    metoCLOPramide (REGLAN) 10 MG tablet  Every 8 hours PRN        05/11/20 1909           Marco Raper Katrinka Blazing   Note:  This document was prepared using Dragon voice recognition software and may include unintentional dictation errors.   Delton Prairie, MD 05/11/20 [phone]

## 2020-05-11 NOTE — ED Notes (Signed)
See triage note  Presents with nausea and vomiting for couple of weeks  Denies any fever

## 2020-06-16 DIAGNOSIS — O0992 Supervision of high risk pregnancy, unspecified, second trimester: Secondary | ICD-10-CM

## 2020-06-24 ENCOUNTER — Emergency Department
Admission: EM | Admit: 2020-06-24 | Discharge: 2020-06-24 | Disposition: A | Discharge status: Home / Self Care | Payer: Medicaid Other | Attending: Emergency Medicine | Admitting: Emergency Medicine

## 2020-06-24 ENCOUNTER — Other Ambulatory Visit: Payer: Self-pay

## 2020-06-24 ENCOUNTER — Encounter: Payer: Self-pay | Admitting: Emergency Medicine

## 2020-06-24 DIAGNOSIS — X58XXXA Exposure to other specified factors, initial encounter: Secondary | ICD-10-CM | POA: Diagnosis not present

## 2020-06-24 DIAGNOSIS — S025XXB Fracture of tooth (traumatic), initial encounter for open fracture: Secondary | ICD-10-CM | POA: Diagnosis not present

## 2020-06-24 DIAGNOSIS — Z3A12 12 weeks gestation of pregnancy: Secondary | ICD-10-CM | POA: Diagnosis not present

## 2020-06-24 DIAGNOSIS — I10 Essential (primary) hypertension: Secondary | ICD-10-CM | POA: Insufficient documentation

## 2020-06-24 DIAGNOSIS — K029 Dental caries, unspecified: Secondary | ICD-10-CM

## 2020-06-24 DIAGNOSIS — Z79899 Other long term (current) drug therapy: Secondary | ICD-10-CM | POA: Diagnosis not present

## 2020-06-24 DIAGNOSIS — O9A211 Injury, poisoning and certain other consequences of external causes complicating pregnancy, first trimester: Secondary | ICD-10-CM | POA: Diagnosis not present

## 2020-06-24 MED ORDER — AMOXICILLIN 500 MG PO CAPS: 500.0000 mg | ORAL_CAPSULE | Freq: Three times a day (TID) | ORAL | 0 refills | Status: DC

## 2020-06-24 MED ORDER — AMOXICILLIN 500 MG PO CAPS
500.0000 mg | ORAL_CAPSULE | Freq: Once | ORAL | Status: AC
  Administered 2020-06-24: 500 mg via ORAL
  Filled 2020-06-24: qty 1

## 2020-06-24 MED ORDER — LIDOCAINE VISCOUS HCL 2 % MT SOLN
15.0000 mL | Freq: Once | OROMUCOSAL | Status: AC
  Administered 2020-06-24: 15 mL via OROMUCOSAL
  Filled 2020-06-24: qty 15

## 2020-06-24 NOTE — ED Notes (Signed)
See triage note, pt reports unable to eat for 2 days d/t toothache on the bottom left side.  No swelling noted at this time.  Denies fevers.  Pt in NAD, ambulatory to treatment room.

## 2020-06-24 NOTE — Discharge Instructions (Addendum)
Take the prescription meds as directed. Follow-up with your Dental Provider as scheduled. Take OTC Extra Strength Tylenol as needed for pain.

## 2020-06-24 NOTE — ED Triage Notes (Signed)
Left sided dental pain for 1 week.  Pt has upcoming appointment with dentist

## 2020-06-25 NOTE — ED Provider Notes (Signed)
[facility] Emergency Department Provider Note ____________________________________________  Time seen: 1646  I have reviewed the triage vital signs and the nursing notes.  HISTORY  Chief Complaint  Dental Pain   HPI Alyssa Mathis is a 34 y.o. female presents to the ED at [redacted] weeks gestation, with complaints of acute on chronic left lower dental pain.  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  She reports recent onset of pain to the left lower jaw patient denies any interim fever, chills, sweats patient had no difficulty swallowing or controlling oral secretions.  She is scheduled to see dental provider on Monday for definitive management.  She has however presented to the ED today for evaluation of acute pain.   Past Medical History:  Diagnosis Date  . Anemia   . Hypertension   . Pregnancy induced hypertension     Patient Active Problem List   Diagnosis Date Noted  . Poor compliance 03/03/2018  . Oligohydramnios antepartum, third trimester 03/03/2018  . Spontaneous vaginal delivery 03/03/2018  . Chronic hypertension with superimposed severe preeclampsia 03/01/2018    Past Surgical History:  Procedure Laterality Date  . NO PAST SURGERIES      Prior to Admission medications   Medication Sig Start Date End Date Taking? Authorizing Provider  amLODipine (NORVASC) 5 MG tablet Take 5 mg by mouth daily. 09/24/19   [provider]  amoxicillin (AMOXIL) 500 MG capsule Take 1 capsule (500 mg total) by mouth 3 (three) times daily. 06/24/20   Roxann Vierra, Charlesetta Ivory, PA-C  benztropine (COGENTIN) 1 MG tablet Take 1 mg by mouth 2 (two) times daily. 10/15/19   [provider]  hydrOXYzine (ATARAX/VISTARIL) 25 MG tablet Take 50 mg by mouth every 8 (eight) hours as needed. 10/15/19   [provider]  hydrOXYzine (ATARAX/VISTARIL) 25 MG tablet Take 1 tablet (25 mg total) by mouth every 8 (eight)  hours as needed for up to 15 doses for anxiety. 01/30/20   Terald Sleeper, MD  labetalol (NORMODYNE) 200 MG tablet Take 200 mg by mouth 2 (two) times daily. 09/24/19   [provider]  metoCLOPramide (REGLAN) 10 MG tablet Take 1 tablet (10 mg total) by mouth every 8 (eight) hours as needed for nausea. 05/11/20 05/11/21  Delton Prairie, MD  Oxcarbazepine (TRILEPTAL) 300 MG tablet Take 300 mg by mouth 2 (two) times daily. 09/24/19   [provider]    Allergies Nifedipine  Family History  Problem Relation Age of Onset  . Hypertension Sister   . Hypertension Maternal Grandmother   . Diabetes Maternal Grandfather   . Hypertension Maternal Grandfather     Social History Social History   Tobacco Use  . Smoking status: Never Smoker  . Smokeless tobacco: Never Used  Substance Use Topics  . Alcohol use: Never  . Drug use: Not on file    Review of Systems  Constitutional: Negative for fever. Eyes: Negative for visual changes. ENT: Negative for sore throat.  Left lower dental pain as above. Cardiovascular: Negative for chest pain. Respiratory: Negative for shortness of breath. Gastrointestinal: Negative for abdominal pain, vomiting and diarrhea. Genitourinary: Negative for dysuria. Musculoskeletal: Negative for back pain. Skin: Negative for rash. Neurological: Negative for headaches, focal weakness or numbness. ____________________________________________  PHYSICAL EXAM:  VITAL SIGNS: ED Triage Vitals  Enc Vitals Group     BP 06/24/20 1448 129/81     Pulse Rate 06/24/20 1448 (!) 115  Resp 06/24/20 1448 18     Temp 06/24/20 1448 98 F (36.7 C)     Temp Source 06/24/20 1448 Oral     SpO2 06/24/20 1448 97 %     Weight 06/24/20 1449 130 lb (59 kg)     Height 06/24/20 1449 5\' 6"  (1.676 m)     Head Circumference --      Peak Flow --      Pain Score 06/24/20 1449 1     Pain Loc --      Pain Edu? --      Excl. in GC? --     Constitutional: Alert and  oriented. Well appearing and in no distress. Head: Normocephalic and atraumatic. Eyes: Conjunctivae are normal. PERRL. Normal extraocular movements Ears: Canals clear. TMs intact bilaterally. Nose: No congestion/rhinorrhea/epistaxis. Mouth/Throat: Mucous membranes are moist. Uvula is midline and tonsils are flat. Lower left 3rd molar with broken, black, decayed to the gumline.  No obvious gum swelling or purulence noted on the left lower jaw. No brawny sublingual erythema.  Neck: Supple. No thyromegaly. Hematological/Lymphatic/Immunological: No cervical lymphadenopathy. Cardiovascular: Normal rate, regular rhythm. Normal distal pulses. Respiratory: Normal respiratory effort. No wheezes/rales/rhonchi. Musculoskeletal: Nontender with normal range of motion in all extremities.  Neurologic:  Normal gait without ataxia. Normal speech and language. No gross focal neurologic deficits are appreciated. Skin:  Skin is warm, dry and intact. No rash noted. ____________________________________________  PROCEDURES  Amoxicillin 500 mg PO [place]ido 2% viscous  Procedures ____________________________________________  INITIAL IMPRESSION / ASSESSMENT AND PLAN / ED COURSE  Patient with ED evaluation of acute left lower dental pain secondary to a chronically broken or decayed third molar.  Patient will be started empirically on amoxicillin and discharged with viscous lidocaine gel for topical pain relief.  Patient is currently [redacted] weeks pregnant, and will take Tylenol for pain relief.  She is encouraged to see her dental provider as scheduled on Monday.  Sheritha Bartelt was evaluated in Emergency Department on 06/25/2020 for the symptoms described in the history of present illness. She was evaluated in the context of the global COVID-19 pandemic, which necessitated consideration that the patient might be at risk for infection with the SARS-CoV-2 virus that causes COVID-19. Institutional protocols and algorithms that  pertain to the evaluation of patients at risk for COVID-19 are in a state of rapid change based on information released by regulatory bodies including the CDC and federal and state organizations. These policies and algorithms were followed during the patient's care in the ED. ____________________________________________  FINAL CLINICAL IMPRESSION(S) / ED DIAGNOSES  Final diagnoses:  Pain due to dental caries  Open fracture of tooth, initial encounter       08/25/2020, PA-C 06/25/20 1854    08/25/20, MD 06/25/20 2303

## 2020-06-27 ENCOUNTER — Emergency Department: Payer: Medicaid Other

## 2020-06-27 ENCOUNTER — Emergency Department
Admission: EM | Admit: 2020-06-27 | Discharge: 2020-06-28 | Disposition: A | Discharge status: Home / Self Care | Payer: Medicaid Other | Attending: Emergency Medicine | Admitting: Emergency Medicine

## 2020-06-27 ENCOUNTER — Other Ambulatory Visit: Payer: Self-pay

## 2020-06-27 ENCOUNTER — Encounter: Payer: Self-pay | Admitting: Emergency Medicine

## 2020-06-27 DIAGNOSIS — Z79899 Other long term (current) drug therapy: Secondary | ICD-10-CM | POA: Diagnosis not present

## 2020-06-27 DIAGNOSIS — F250 Schizoaffective disorder, bipolar type: Secondary | ICD-10-CM

## 2020-06-27 DIAGNOSIS — Z3A13 13 weeks gestation of pregnancy: Secondary | ICD-10-CM | POA: Diagnosis not present

## 2020-06-27 DIAGNOSIS — O10011 Pre-existing essential hypertension complicating pregnancy, first trimester: Secondary | ICD-10-CM | POA: Diagnosis not present

## 2020-06-27 DIAGNOSIS — Z20822 Contact with and (suspected) exposure to covid-19: Secondary | ICD-10-CM | POA: Insufficient documentation

## 2020-06-27 DIAGNOSIS — R000 Tachycardia, unspecified: Secondary | ICD-10-CM | POA: Insufficient documentation

## 2020-06-27 DIAGNOSIS — O99341 Other mental disorders complicating pregnancy, first trimester: Secondary | ICD-10-CM | POA: Diagnosis not present

## 2020-06-27 DIAGNOSIS — O99283 Endocrine, nutritional and metabolic diseases complicating pregnancy, third trimester: Secondary | ICD-10-CM | POA: Insufficient documentation

## 2020-06-27 DIAGNOSIS — I10 Essential (primary) hypertension: Secondary | ICD-10-CM

## 2020-06-27 DIAGNOSIS — Z3490 Encounter for supervision of normal pregnancy, unspecified, unspecified trimester: Secondary | ICD-10-CM

## 2020-06-27 DIAGNOSIS — R4689 Other symptoms and signs involving appearance and behavior: Secondary | ICD-10-CM

## 2020-06-27 DIAGNOSIS — F259 Schizoaffective disorder, unspecified: Secondary | ICD-10-CM

## 2020-06-27 DIAGNOSIS — O0992 Supervision of high risk pregnancy, unspecified, second trimester: Secondary | ICD-10-CM

## 2020-06-27 DIAGNOSIS — E876 Hypokalemia: Secondary | ICD-10-CM | POA: Diagnosis not present

## 2020-06-27 LAB — COMPREHENSIVE METABOLIC PANEL
ALT: 21 U/L (ref 0–44)
AST: 30 U/L (ref 15–41)
Albumin: 4.2 g/dL (ref 3.5–5.0)
Alkaline Phosphatase: 53 U/L (ref 38–126)
Anion gap: 12 (ref 5–15)
BUN: 8 mg/dL (ref 6–20)
CO2: 21 mmol/L — ABNORMAL LOW (ref 22–32)
Calcium: 9.5 mg/dL (ref 8.9–10.3)
Chloride: 104 mmol/L (ref 98–111)
Creatinine, Ser: 0.69 mg/dL (ref 0.44–1.00)
GFR, Estimated: >60 mL/min (ref >60)
Glucose, Bld: 105 mg/dL — ABNORMAL HIGH (ref 70–99)
Potassium: 3.1 mmol/L — ABNORMAL LOW (ref 3.5–5.1)
Sodium: 137 mmol/L (ref 135–145)
Total Bilirubin: 0.9 mg/dL (ref 0.3–1.2)
Total Protein: 8.2 g/dL — ABNORMAL HIGH (ref 6.5–8.1)

## 2020-06-27 LAB — ETHANOL: Alcohol, Ethyl (B): <10 mg/dL (ref <10)

## 2020-06-27 LAB — CBC
HCT: 32.7 % — ABNORMAL LOW (ref 36.0–46.0)
Hemoglobin: 11.7 g/dL — ABNORMAL LOW (ref 12.0–15.0)
MCH: 31.5 pg (ref 26.0–34.0)
MCHC: 35.8 g/dL (ref 30.0–36.0)
MCV: 88.1 fL (ref 80.0–100.0)
Platelets: 235 10*3/uL (ref 150–400)
RBC: 3.71 MIL/uL — ABNORMAL LOW (ref 3.87–5.11)
RDW: 13.2 % (ref 11.5–15.5)
WBC: 7.4 10*3/uL (ref 4.0–10.5)
nRBC: 0 % (ref 0.0–0.2)

## 2020-06-27 LAB — HCG, QUANTITATIVE, PREGNANCY: hCG, Beta Chain, Quant, S: 62268 m[IU]/mL — ABNORMAL HIGH (ref <5)

## 2020-06-27 LAB — SALICYLATE LEVEL: Salicylate Lvl: <7 mg/dL — ABNORMAL LOW (ref 7.0–30.0)

## 2020-06-27 LAB — ACETAMINOPHEN LEVEL: Acetaminophen (Tylenol), Serum: <10 ug/mL — ABNORMAL LOW (ref 10–30)

## 2020-06-27 MED ORDER — LABETALOL HCL 200 MG PO TABS
200.0000 mg | ORAL_TABLET | Freq: Two times a day (BID) | ORAL | Status: DC
  Administered 2020-06-28: 200 mg via ORAL
  Filled 2020-06-27 (×3): qty 1

## 2020-06-27 MED ORDER — METOCLOPRAMIDE HCL 10 MG PO TABS: 10.0000 mg | ORAL_TABLET | Freq: Three times a day (TID) | ORAL | Status: DC | PRN

## 2020-06-27 MED ORDER — AMLODIPINE BESYLATE 5 MG PO TABS
5.0000 mg | ORAL_TABLET | Freq: Every day | ORAL | Status: DC
  Administered 2020-06-28: 5 mg via ORAL
  Filled 2020-06-27 (×2): qty 1

## 2020-06-27 MED ORDER — ASPIRIN 81 MG PO CHEW
81.0000 mg | CHEWABLE_TABLET | Freq: Every day | ORAL | Status: DC
  Administered 2020-06-28: 81 mg via ORAL
  Filled 2020-06-27 (×2): qty 1

## 2020-06-27 MED ORDER — RISPERIDONE 0.5 MG PO TBDP: 4.0000 mg | ORAL_TABLET | Freq: Every day | ORAL | Status: DC

## 2020-06-27 MED ORDER — PRENATAL PLUS 27-1 MG PO TABS
1.0000 | ORAL_TABLET | Freq: Every day | ORAL | Status: DC
  Filled 2020-06-27: qty 1

## 2020-06-27 MED ORDER — BENZTROPINE MESYLATE 1 MG PO TABS
1.0000 mg | ORAL_TABLET | Freq: Every day | ORAL | Status: DC
  Filled 2020-06-27: qty 1

## 2020-06-27 MED ORDER — SODIUM CHLORIDE 0.9 % IV BOLUS
1000.0000 mL | Freq: Once | INTRAVENOUS | Status: AC
  Administered 2020-06-27: 1000 mL via INTRAVENOUS

## 2020-06-27 MED ORDER — RISPERIDONE 1 MG PO TABS
4.0000 mg | ORAL_TABLET | Freq: Every day | ORAL | Status: DC
  Filled 2020-06-27: qty 4

## 2020-06-27 MED ORDER — LABETALOL HCL 200 MG PO TABS
200.0000 mg | ORAL_TABLET | Freq: Two times a day (BID) | ORAL | Status: DC
  Filled 2020-06-27: qty 1

## 2020-06-27 MED ORDER — AMOXICILLIN 500 MG PO CAPS
500.0000 mg | ORAL_CAPSULE | Freq: Three times a day (TID) | ORAL | Status: DC
  Administered 2020-06-27 – 2020-06-28 (×2): 500 mg via ORAL
  Filled 2020-06-27 (×3): qty 1

## 2020-06-27 MED ORDER — POTASSIUM CHLORIDE CRYS ER 20 MEQ PO TBCR
40.0000 meq | EXTENDED_RELEASE_TABLET | Freq: Once | ORAL | Status: AC
  Administered 2020-06-27: 40 meq via ORAL
  Filled 2020-06-27: qty 2
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
## 2020-06-27 NOTE — ED Notes (Signed)
Dr. Derrill Kay states to administer pts labetolol that she refused at 1600, now

## 2020-06-27 NOTE — ED Notes (Addendum)
While attempted to have pt take her medications pt begins to speak of religion (referencing Jesus, the devil, and angels. That they are instructing her and taking her wings) and how there is an individual who has changed her name and is trying to take her identity. Pt unable to stay on appropriate topic and states staff is attempting to deceive her. Pt speech is very broken up due to regular pauses and long breaks between words where she appears to be thinking about what to say.

## 2020-06-27 NOTE — ED Notes (Addendum)
Pt refused to take medications.  RN told pt what each pill was for and the name, but patient had a reason not to take each one.

## 2020-06-27 NOTE — ED Notes (Signed)
MD notified of pts elevated HR, states to encourage oral hydration and to recheck in 1 hour

## 2020-06-27 NOTE — Consult Note (Signed)
[facility] Face-to-Face Psychiatry Consult   Reason for Consult: Consult for this 34 year old woman with a history of schizoaffective disorder brought to the hospital under involuntary commitment papers.  Papers filed by the father of her children state that she has been increasingly psychotic with bizarre behavior not sleeping for days probably consuming too much caffeine.  In the emergency room the patient was uncooperative disorganized in her speech unable to give a coherent history.  When she did talk she was hyper religious. Referring Physician: Derrill Kay Patient Identification: Alyssa Mathis MRN:  [id number] Principal Diagnosis: Schizoaffective disorder [facility]) Diagnosis:  Principal Problem:   Schizoaffective disorder (HCC) Active Problems:   Pregnant   Hypertension   Total Time spent with patient: 1 hour  Subjective:   Alyssa Mathis is a 34 y.o. female patient admitted with "I know Jesus is going to take care of it".  HPI: Patient seen chart reviewed.  Patient brought in under IVC.  She was agitated and animated on first presentation but then was not cooperative with offering a lucid history.  When she did talk with intake staff she became hyperverbal and hyper religious with rambling speech.  Not able to answer questions directly.  Paperwork reports that the patient has been noncompliant with medicine and over consuming caffeine and has not slept for days.  Reports that she has been throwing herself on the ground claiming that she is trying to hurt her unborn child.  Patient is currently pregnant approximately 13 weeks and a few days.  Past Psychiatric History: Past history of schizoaffective disorder including past hospitalizations with treatment with antipsychotic medicine.  Most recently it looks like she was in the hospital in May of this year.  Risk to Self: Suicidal Ideation: (P) No Suicidal Intent: (P) No Is patient at risk for suicide?: (P) No Suicidal Plan?: (P) No Access to Means:  (P) Yes Risk to Others:   Prior Inpatient Therapy:   Prior Outpatient Therapy:    Past Medical History:  Past Medical History:  Diagnosis Date  . Anemia   . Hypertension   . Pregnancy induced hypertension     Past Surgical History:  Procedure Laterality Date  . NO PAST SURGERIES     Family History:  Family History  Problem Relation Age of Onset  . Hypertension Sister   . Hypertension Maternal Grandmother   . Diabetes Maternal Grandfather   . Hypertension Maternal Grandfather    Family Psychiatric  History: See previous Social History:  Social History   Substance and Sexual Activity  Alcohol Use Never     Social History   Substance and Sexual Activity  Drug Use Not on file    Social History   Socioeconomic History  . Marital status: Single    Spouse name: Not on file  . Number of children: Not on file  . Years of education: Not on file  . Highest education level: Not on file  Occupational History  . Not on file  Tobacco Use  . Smoking status: Never Smoker  . Smokeless tobacco: Never Used  Substance and Sexual Activity  . Alcohol use: Never  . Drug use: Not on file  . Sexual activity: Yes  Other Topics Concern  . Not on file  Social History Narrative  . Not on file   Social Determinants of Health   Financial Resource Strain:   . Difficulty of Paying Living Expenses: Not on file  Food Insecurity:   . Worried About Programme researcher, broadcasting/film/video in the  Last Year: Not on file  . Ran Out of Food in the Last Year: Not on file  Transportation Mathis:   . Lack of Transportation (Medical): Not on file  . Lack of Transportation (Non-Medical): Not on file  Physical Activity:   . Days of Exercise per Week: Not on file  . Minutes of Exercise per Session: Not on file  Stress:   . Feeling of Stress : Not on file  Social Connections:   . Frequency of Communication with Friends and Family: Not on file  . Frequency of Social Gatherings with Friends and Family: Not on file   . Attends Religious Services: Not on file  . Active Member of Clubs or Organizations: Not on file  . Attends Banker Meetings: Not on file  . Marital Status: Not on file   Additional Social History:    Allergies:   Allergies  Allergen Reactions  . Nifedipine     Other reaction(s): Unknown    Labs:  Results for orders placed or performed during the hospital encounter of 06/27/20 (from the past 48 hour(s))  Comprehensive metabolic panel     Status: Abnormal   Collection Time: 06/27/20  1:22 PM  Result Value Ref Range   Sodium 137 135 - 145 mmol/L   Potassium 3.1 (L) 3.5 - 5.1 mmol/L   Chloride 104 98 - 111 mmol/L   CO2 21 (L) 22 - 32 mmol/L   Glucose, Bld 105 (H) 70 - 99 mg/dL    Comment: Glucose reference range applies only to samples taken after fasting for at least 8 hours.   BUN 8 6 - 20 mg/dL   Creatinine, Ser 3.29 0.44 - 1.00 mg/dL   Calcium 9.5 8.9 - 92.4 mg/dL   Total Protein 8.2 (H) 6.5 - 8.1 g/dL   Albumin 4.2 3.5 - 5.0 g/dL   AST 30 15 - 41 U/L   ALT 21 0 - 44 U/L   Alkaline Phosphatase 53 38 - 126 U/L   Total Bilirubin 0.9 0.3 - 1.2 mg/dL   GFR, Estimated >26 >83 mL/min   Anion gap 12 5 - 15    Comment: Performed at [facility], [address] [place], [place] [address]  Ethanol     Status: None   Collection Time: 06/27/20  1:22 PM  Result Value Ref Range   Alcohol, Ethyl (B) <10 <10 mg/dL    Comment: (NOTE) Lowest detectable limit for serum alcohol is 10 mg/dL.  For medical purposes only. Performed at [facility], [address] [place], [place] [address]   Salicylate level     Status: Abnormal   Collection Time: 06/27/20  1:22 PM  Result Value Ref Range   Salicylate Lvl <7.0 (L) 7.0 - 30.0 mg/dL    Comment: Performed at [facility], [address] [place], [place] [address]  Acetaminophen level     Status: Abnormal   Collection Time: 06/27/20  1:22 PM  Result Value Ref Range   Acetaminophen  (Tylenol), Serum <10 (L) 10 - 30 ug/mL    Comment: (NOTE) Therapeutic concentrations vary significantly. A range of 10-30 ug/mL  may be an effective concentration for many patients. However, some  are best treated at concentrations outside of this range. Acetaminophen concentrations >150 ug/mL at 4 hours after ingestion  and >50 ug/mL at 12 hours after ingestion are often associated with  toxic reactions.  Performed at [facility], [address] [place], [place] [address]  cbc     Status: Abnormal   Collection Time: 06/27/20  1:22 PM  Result Value Ref Range   WBC 7.4 4.0 - 10.5 K/uL   RBC 3.71 (L) 3.87 - 5.11 MIL/uL   Hemoglobin 11.7 (L) 12.0 - 15.0 g/dL   HCT 16.132.7 (L) 36 - 46 %   MCV 88.1 80.0 - 100.0 fL   MCH 31.5 26.0 - 34.0 pg   MCHC 35.8 30.0 - 36.0 g/dL   RDW 09.613.2 04.511.5 - 40.915.5 %   Platelets 235 150 - 400 K/uL   nRBC 0.0 0.0 - 0.2 %    Comment: Performed at [facility], [address] [place], [place] [address]  hCG, quantitative, pregnancy     Status: Abnormal   Collection Time: 06/27/20  1:22 PM  Result Value Ref Range   hCG, Beta Chain, Quant, S 62,268 (H) <5 mIU/mL    Comment:          GEST. AGE      CONC.  (mIU/mL)   <=1 WEEK        5 - 50     2 WEEKS       50 - 500     3 WEEKS       100 - 10,000     4 WEEKS     1,000 - 30,000     5 WEEKS     3,500 - 115,000   6-8 WEEKS     12,000 - 270,000    12 WEEKS     15,000 - 220,000        FEMALE AND NON-PREGNANT FEMALE:     LESS THAN 5 mIU/mL Performed at [facility], [address] [place], [place] [address]     Current Facility-Administered Medications  Medication Dose Route Frequency Provider Last Rate Last Admin  . amLODipine (NORVASC) tablet 5 mg  5 mg Oral Daily Jurrell Royster T, MD      . amoxicillin (AMOXIL) capsule 500 mg  500 mg Oral TID Gilles ChiquitoSmith, Zachary P, MD      . aspirin chewable tablet 81 mg  81 mg Oral Daily Augustina Braddock T, MD      . benztropine (COGENTIN) tablet 1  mg  1 mg Oral QHS Vonda Harth T, MD      . labetalol (NORMODYNE) tablet 200 mg  200 mg Oral BID Gilles ChiquitoSmith, Zachary P, MD      . metoCLOPramide (REGLAN) tablet 10 mg  10 mg Oral Q8H PRN Gilles ChiquitoSmith, Zachary P, MD      . potassium chloride SA (KLOR-CON) CR tablet 40 mEq  40 mEq Oral Once Gilles ChiquitoSmith, Zachary P, MD      . Melene Muller[START ON 06/28/2020] prenatal vitamin w/FE, FA (PRENATAL 1 + 1) 27-1 MG tablet 1 tablet  1 tablet Oral Q1200 Gilles ChiquitoSmith, Zachary P, MD      . risperiDONE (RISPERDAL M-TABS) disintegrating tablet 4 mg  4 mg Oral QHS Raylon Lamson, Jackquline DenmarkJohn T, MD       Current Outpatient Medications  Medication Sig Dispense Refill  . amLODipine (NORVASC) 5 MG tablet Take 5 mg by mouth daily.    [name]. amoxicillin (AMOXIL) 500 MG capsule Take 1 capsule (500 mg total) by mouth 3 (three) times daily. 30 capsule 0  . benztropine (COGENTIN) 1 MG tablet Take 1 mg by mouth 2 (two) times daily.    . hydrOXYzine (ATARAX/VISTARIL) 25 MG tablet Take 50 mg by mouth every 8 (eight) hours as needed.    .[name]  hydrOXYzine (ATARAX/VISTARIL) 25 MG tablet Take 1 tablet (25 mg total) by mouth every 8 (eight) hours as needed for up to 15 doses for anxiety. 12 tablet 0  . labetalol (NORMODYNE) 200 MG tablet Take 200 mg by mouth 2 (two) times daily.    . metoCLOPramide (REGLAN) 10 MG tablet Take 1 tablet (10 mg total) by mouth every 8 (eight) hours as needed for nausea. 30 tablet 0  . Oxcarbazepine (TRILEPTAL) 300 MG tablet Take 300 mg by mouth 2 (two) times daily.      Musculoskeletal: Strength & Muscle Tone: within normal limits Gait & Station: normal Patient leans: N/A  Psychiatric Specialty Exam: Physical Exam Vitals and nursing note reviewed.  Constitutional:      Appearance: She is well-developed.  HENT:     Head: Normocephalic and atraumatic.  Eyes:     Conjunctiva/sclera: Conjunctivae normal.     Pupils: Pupils are equal, round, and reactive to light.  Cardiovascular:     Heart sounds: Normal heart sounds.  Pulmonary:     Effort:  Pulmonary effort is normal.  Abdominal:     Palpations: Abdomen is soft.  Musculoskeletal:        General: Normal range of motion.     Cervical back: Normal range of motion.  Skin:    General: Skin is warm and dry.  Neurological:     General: No focal deficit present.     Mental Status: She is alert.  Psychiatric:        Attention and Perception: She is inattentive.        Mood and Affect: Mood is anxious. Affect is labile.        Speech: She is noncommunicative.        Behavior: Behavior is agitated and withdrawn.        Thought Content: Thought content is paranoid. Thought content does not include homicidal or suicidal ideation.        Cognition and Memory: Cognition is impaired.        Judgment: Judgment is impulsive.     Review of Systems  Constitutional: Negative.   HENT: Negative.   Eyes: Negative.   Respiratory: Negative.   Cardiovascular: Negative.   Gastrointestinal: Negative.   Musculoskeletal: Negative.   Skin: Negative.   Neurological: Negative.   Psychiatric/Behavioral: Positive for confusion.    Blood pressure (!) 160/110, pulse (!) 110, resp. rate 18, height 5\' 6"  (1.676 m), weight 59 kg, SpO2 100 %, unknown if currently breastfeeding.Body mass index is 20.98 kg/m.  General Appearance: Casual  Eye Contact:  Minimal  Speech:  Slow  Volume:  Decreased  Mood:  Anxious and Irritable  Affect:  Congruent and Inappropriate  Thought Process:  Disorganized  Orientation:  Full (Time, Place, and Person)  Thought Content:  Illogical, Paranoid Ideation and Rumination  Suicidal Thoughts:  No  Homicidal Thoughts:  No  Memory:  Immediate;   Fair Recent;   Poor Remote;   Poor  Judgement:  Impaired  Insight:  Shallow  Psychomotor Activity:  Decreased  Concentration:  Concentration: Poor  Recall:  Poor  Fund of Knowledge:  Poor  Language:  Poor  Akathisia:  No  Handed:  Right  AIMS (if indicated):     Assets:  Desire for Improvement Housing Resilience  ADL's:   Impaired  Cognition:  Impaired,  Mild  Sleep:        Treatment Plan Summary: Daily contact with patient to assess and evaluate symptoms and progress in treatment,  Medication management and Plan Patient is a little over [redacted] weeks pregnant.  Her blood pressure is running high.  I have contacted the OB/GYN on call today who will see the patient later but feels it is unlikely to be preeclampsia given the early phase of her pregnancy.  Restart amlodipine and labetalol as prescribed previously for blood pressure.  Restart Risperdal as prescribed previously for psychotic disorder.  Admit to psychiatric unit.  15-minute checks.  Patient still Mathis her labs obtained as soon as possible.  Disposition: Recommend psychiatric Inpatient admission when medically cleared.  Mordecai Rasmussen, MD 06/27/2020 4:42 PM

## 2020-06-27 NOTE — ED Notes (Signed)
Pt not willing to go to [name].  Dr. Toni Amend and TTS at bedside.

## 2020-06-27 NOTE — ED Notes (Signed)
Pt continues to refuse EKG and nasal swab, will continue to attempt to obtain. IV fluids are finished at this time

## 2020-06-27 NOTE — ED Notes (Signed)
IVC, pend inpt admit

## 2020-06-27 NOTE — BH Assessment (Signed)
Referral information for Child/Adolescent Placement have been faxed to:    [facility] [phone] or [phone])    [facility] [phone]),    [address] [phone]),    [facility] [phone]),   [facility] [phone]).    [facility] [phone])

## 2020-06-27 NOTE — ED Provider Notes (Addendum)
[facility] Emergency Department Provider Note  ____________________________________________   First MD Initiated Contact with Patient 06/27/20 1357     (approximate)  I have reviewed the triage vital signs and the nursing notes.   HISTORY  Chief Complaint IVC   HPI Alyssa Mathis is a 34 y.o. female with a past medical history of anemia, HTN, reportedly has a history of bipolar disorder not currently taking medications and is reportedly [redacted] weeks pregnant who presents in police custody after police were called to her residence where a family member was concerned patient seemed to be screaming and reportedly has not slept in 2 days.  Patient was reportedly throwing herself on the ground because her significant other did not love her baby.  Patient refuses to engage with this examiner provide any meaningful history.  No other history is immediately available on patient arrival.         Past Medical History:  Diagnosis Date  . Anemia   . Hypertension   . Pregnancy induced hypertension     Patient Active Problem List   Diagnosis Date Noted  . Poor compliance 03/03/2018  . Oligohydramnios antepartum, third trimester 03/03/2018  . Spontaneous vaginal delivery 03/03/2018  . Chronic hypertension with superimposed severe preeclampsia 03/01/2018    Past Surgical History:  Procedure Laterality Date  . NO PAST SURGERIES      Prior to Admission medications   Medication Sig Start Date End Date Taking? Authorizing Provider  amLODipine (NORVASC) 5 MG tablet Take 5 mg by mouth daily. 09/24/19   [provider]  amoxicillin (AMOXIL) 500 MG capsule Take 1 capsule (500 mg total) by mouth 3 (three) times daily. 06/24/20   Menshew, Charlesetta Ivory, PA-C  benztropine (COGENTIN) 1 MG tablet Take 1 mg by mouth 2 (two) times daily. 10/15/19   [provider]  hydrOXYzine (ATARAX/VISTARIL) 25 MG tablet Take 50 mg by mouth every 8 (eight) hours as needed.  10/15/19   [provider]  hydrOXYzine (ATARAX/VISTARIL) 25 MG tablet Take 1 tablet (25 mg total) by mouth every 8 (eight) hours as needed for up to 15 doses for anxiety. 01/30/20   Terald Sleeper, MD  labetalol (NORMODYNE) 200 MG tablet Take 200 mg by mouth 2 (two) times daily. 09/24/19   [provider]  metoCLOPramide (REGLAN) 10 MG tablet Take 1 tablet (10 mg total) by mouth every 8 (eight) hours as needed for nausea. 05/11/20 05/11/21  Delton Prairie, MD  Oxcarbazepine (TRILEPTAL) 300 MG tablet Take 300 mg by mouth 2 (two) times daily. 09/24/19   [provider]    Allergies Nifedipine  Family History  Problem Relation Age of Onset  . Hypertension Sister   . Hypertension Maternal Grandmother   . Diabetes Maternal Grandfather   . Hypertension Maternal Grandfather     Social History Social History   Tobacco Use  . Smoking status: Never Smoker  . Smokeless tobacco: Never Used  Substance Use Topics  . Alcohol use: Never  . Drug use: Not on file    Review of Systems  Review of Systems  Unable to perform ROS: Other    Patient refuses to participate in review of systems.  ____________________________________________   PHYSICAL EXAM:  VITAL SIGNS: ED Triage Vitals  Enc Vitals Group     BP 06/27/20 1256 (!) 165/116     Pulse Rate 06/27/20 1256 (!) 158     Resp 06/27/20 1256 18     Temp --  Temp src --      SpO2 06/27/20 1256 96 %     Weight 06/27/20 1320 130 lb (59 kg)     Height 06/27/20 1320 5\' 6"  (1.676 m)     Head Circumference --      Peak Flow --      Pain Score 06/27/20 1320 0     Pain Loc --      Pain Edu? --      Excl. in GC? --    Vitals:   06/27/20 1256 06/27/20 1438  BP: (!) 165/116 (!) 160/110  Pulse: (!) 158 (!) 110  Resp: 18   SpO2: 96% 100%   Physical Exam Vitals and nursing note reviewed.  Constitutional:      General: She is not in acute distress.    Appearance: She is well-developed.  HENT:     Head:  Normocephalic and atraumatic.     Right Ear: External ear normal.     Left Ear: External ear normal.     Nose: Nose normal.  Eyes:     Conjunctiva/sclera: Conjunctivae normal.  Cardiovascular:     Rate and Rhythm: Regular rhythm. Tachycardia present.     Heart sounds: No murmur heard.   Pulmonary:     Effort: Pulmonary effort is normal. No respiratory distress.     Breath sounds: Normal breath sounds.  Abdominal:     Palpations: Abdomen is soft.     Tenderness: There is no abdominal tenderness.  Musculoskeletal:     Cervical back: Neck supple.  Skin:    General: Skin is warm and dry.  Neurological:     Mental Status: She is alert.  Psychiatric:        Speech: She is noncommunicative.      ____________________________________________   LABS (all labs ordered are listed, but only abnormal results are displayed)  Labs Reviewed  COMPREHENSIVE METABOLIC PANEL - Abnormal; Notable for the following components:      Result Value   Potassium 3.1 (*)    CO2 21 (*)    Glucose, Bld 105 (*)    Total Protein 8.2 (*)    All other components within normal limits  SALICYLATE LEVEL - Abnormal; Notable for the following components:   Salicylate Lvl <7.0 (*)    All other components within normal limits  ACETAMINOPHEN LEVEL - Abnormal; Notable for the following components:   Acetaminophen (Tylenol), Serum <10 (*)    All other components within normal limits  CBC - Abnormal; Notable for the following components:   RBC 3.71 (*)    Hemoglobin 11.7 (*)    HCT 32.7 (*)    All other components within normal limits  RESPIRATORY PANEL BY RT PCR (FLU A&B, COVID)  ETHANOL  URINE DRUG SCREEN, QUALITATIVE ([facility] ONLY)  HCG, QUANTITATIVE, PREGNANCY  POC URINE PREG, ED   ____________________________________________  ____________________________________________   PROCEDURES  Procedure(s) performed (including Critical  Care):  Procedures   ____________________________________________   INITIAL IMPRESSION / ASSESSMENT AND PLAN / ED COURSE        Patient presents with 08/27/20 to history exam for assessment after IVC paperwork was filled out reported patient had been screaming about hyperreligious things and throwing herself on the ground not taking any medications for her bipolar disorder.  On arrival patient is known to be quite tachycardic with a heart rate of 158 and hypertensive with a BP of 165/116 but on recheck her heart rate was 110   Routine psychiatric labs were  sent.  Patient did consent for obstetrical ultrasound which I ordered.  Care of patient signed over to oncoming provider approximately 1500.  Plan is to follow-up obstetrical ultrasound and remaining labs.  The patient has been placed in psychiatric observation due to the need to provide a safe environment for the patient while obtaining psychiatric consultation and evaluation, as well as ongoing medical and medication management to treat the patient's condition.  The patient has been placed under full IVC at this time.   ____________________________________________   FINAL CLINICAL IMPRESSION(S) / ED DIAGNOSES  Final diagnoses:  Hypokalemia  Behavior concern  Tachycardia  Hypertension, unspecified type  [redacted] weeks gestation of pregnancy    Medications  potassium chloride SA (KLOR-CON) CR tablet 40 mEq (has no administration in time range)  labetalol (NORMODYNE) tablet 200 mg (has no administration in time range)  metoCLOPramide (REGLAN) tablet 10 mg (has no administration in time range)  amoxicillin (AMOXIL) capsule 500 mg (has no administration in time range)     ED Discharge Orders    None       Note:  This document was prepared using Dragon voice recognition software and may include unintentional dictation errors.   Gilles Chiquito, MD 06/27/20 1529    Gilles Chiquito, MD 06/27/20 1530

## 2020-06-27 NOTE — BH Assessment (Addendum)
Assessment Note Alyssa Mathis is an 34 y.o. female who presents to [name] ED involuntarily for treatment. Per triage note, Pt presents to ED via BPD with c/o psychiatric eval. Per IVC papers, pt has not slept in 2 days, per IVC pt has been drinking red bull constantly. IVC papers state pt was screaming in the middle the floor in front of her children. IVC papers state that patient is approx [redacted] weeks pregnant, earlier today threw herself onto the ground due to her SO not "loving the baby" in an attempt to hurt the fetus. Pt arrives to ED screaming and looking around stating that she doesn't have her ID and screaming her name around the lobby. Pt obviously paranoid on arrival to ED, difficult to get to answer questions, repeatedly stating that we (ED staff) are just trying to set her up.  During TTS assessment pt presents alert, calm, apprehensive, preoccupied, tangential thought process, and oriented x 2, apprehensive but somewhat cooperative, and mood-congruent with affect. The pt appears to be responding to internal or external stimuli as observed blankly starring at the ceiling and walls. Pt also presents with some delusional thinking stating, "I have to listen to god, god spoke to me and said". Pt is unable to verify the information provided to triage RN or complete assessment at this time due to her incoherent presentation.   Pt attempted to answer some questions but was unclear in her responses as she currently endorses long pauses, blank stares and thought blocking. Pt was unable to identify her main complaint, MH/SA hx, family supports, medication hx, or triggers to current symptoms. Per IVC, pt's children father is involved, pt has a hx of bipolar disorder, is currently non-compliant with medications, insomnia for 2 days, and was found screaming about the devil and god at home. Pt is currently [redacted] weeks pregnant and Per admission notes, pt threw herself on the ground claiming intent to harm her unborn  child.   Per Dr. Toni Amend Pt meets criteria for INPT  Diagnosis: Per hx schizoaffective Disorder Past Medical History:  Past Medical History:  Diagnosis Date  . Anemia   . Hypertension   . Pregnancy induced hypertension     Past Surgical History:  Procedure Laterality Date  . NO PAST SURGERIES      Family History:  Family History  Problem Relation Age of Onset  . Hypertension Sister   . Hypertension Maternal Grandmother   . Diabetes Maternal Grandfather   . Hypertension Maternal Grandfather     Social History:  reports that she has never smoked. She has never used smokeless tobacco. She reports that she does not drink alcohol. No history on file for drug use.  Additional Social History:  Alcohol / Drug Use Pain Medications: see mar Prescriptions: see mar Over the Counter: see mar History of alcohol / drug use?: No history of alcohol / drug abuse  CIWA: CIWA-Ar BP: (!) 160/110 Pulse Rate: (!) 110 COWS:    Allergies:  Allergies  Allergen Reactions  . Nifedipine     Other reaction(s): Unknown    Home Medications: (Not in a hospital admission)   OB/GYN Status:  No LMP recorded. Patient is pregnant.  General Assessment Data Location of Assessment: [facility] ED TTS Assessment: In system Is this a Tele or Face-to-Face Assessment?: Face-to-Face Is this an Initial Assessment or a Re-assessment for this encounter?: Initial Assessment Patient Accompanied by:: N/A Language Other than English: No Living Arrangements: Other (Comment) What gender do you identify as?: Female  Date Telepsych consult ordered in [facility]: 06/27/20 Time Telepsych consult ordered in [facility]: 1255 Marital status: Single Maiden name: n/a Pregnancy Status: No Living Arrangements:  (unclear ) Can pt return to current living arrangement?:  (UTA) Admission Status: Involuntary Petitioner: ED Attending Is patient capable of signing voluntary admission?: No Referral Source: Other Insurance type: Medicaid       Crisis Care Plan Living Arrangements:  (unclear ) Legal Guardian:  (Self) Name of Psychiatrist: UTA Name of Therapist: UTA  Education Status Is patient currently in school?: No Is the patient employed, unemployed or receiving disability?:  (UTA)  Risk to self with the past 6 months Suicidal Ideation: No Has patient been a risk to self within the past 6 months prior to admission? : Other (comment) Suicidal Intent: No Has patient had any suicidal intent within the past 6 months prior to admission? : No Is patient at risk for suicide?: No Suicidal Plan?: No Has patient had any suicidal plan within the past 6 months prior to admission? : No Access to Means: No Specify Access to Suicidal Means: N/A What has been your use of drugs/alcohol within the last 12 months?: None reported  Previous Attempts/Gestures: No How many times?: 0 Other Self Harm Risks: None reported Triggers for Past Attempts: None known Intentional Self Injurious Behavior: None Family Suicide History: Unable to assess Recent stressful life event(s):  (UTA) Persecutory voices/beliefs?:  (UTA) Depression: Yes Depression Symptoms: Tearfulness Substance abuse history and/or treatment for substance abuse?: No Suicide prevention information given to non-admitted patients: Not applicable  Risk to Others within the past 6 months Homicidal Ideation: No Does patient have any lifetime risk of violence toward others beyond the six months prior to admission? : No Thoughts of Harm to Others: No Current Homicidal Intent: No Current Homicidal Plan: No Access to Homicidal Means: No Identified Victim: N/A History of harm to others?: No Assessment of Violence: None Noted Violent Behavior Description: N/A Does patient have access to weapons?: No Criminal Charges Pending?: No Does patient have a court date: No Is patient on probation?: No  Psychosis Hallucinations: Auditory Delusions: Grandiose  Mental Status  Report Appearance/Hygiene: In scrubs Eye Contact: Poor Motor Activity: Freedom of movement Speech: Incoherent, Slow Level of Consciousness: Alert Mood: Labile, Preoccupied, Euthymic Affect: Labile, Preoccupied, Flat Anxiety Level: None Thought Processes: Tangential, Thought Blocking Judgement: Impaired Orientation: Unable to assess Obsessive Compulsive Thoughts/Behaviors: Minimal ("I gotta listen to god")  Cognitive Functioning Concentration: Poor Memory: Recent Impaired, Remote Impaired Is patient IDD: No Insight: Poor Impulse Control: Unable to Assess Appetite:  (UTA) Have you had any weight changes? :  (UTA) Sleep: Unable to Assess Total Hours of Sleep:  (UTA) Vegetative Symptoms: Unable to Assess  ADLScreening [facility] Assessment Services) Patient's cognitive ability adequate to safely complete daily activities?: Yes Patient able to express need for assistance with ADLs?: Yes Independently performs ADLs?: Yes (appropriate for developmental age)  Prior Inpatient Therapy Prior Inpatient Therapy: Yes Prior Therapy Dates: 02/02/20 Prior Therapy Facilty/Provider(s): [facility] Reason for Treatment: Schizoaffective Disorder   Prior Outpatient Therapy Prior Outpatient Therapy:  (UTA)  ADL Screening (condition at time of admission) Patient's cognitive ability adequate to safely complete daily activities?: Yes Is the patient deaf or have difficulty hearing?: No Does the patient have difficulty seeing, even when wearing glasses/contacts?: No Does the patient have difficulty concentrating, remembering, or making decisions?: Yes Patient able to express need for assistance with ADLs?: Yes Does the patient have difficulty dressing or bathing?: No Independently performs ADLs?: Yes (  appropriate for developmental age) Does the patient have difficulty walking or climbing stairs?: No Weakness of Legs: None Weakness of Arms/Hands: None  Home Assistive Devices/Equipment Home Assistive  Devices/Equipment: None  Therapy Consults (therapy consults require a physician order) PT Evaluation Needed: No OT Evalulation Needed: No SLP Evaluation Needed: No Abuse/Neglect Assessment (Assessment to be complete while patient is alone) Abuse/Neglect Assessment Can Be Completed: Unable to assess, patient is non-responsive or altered mental status Values / Beliefs Cultural Requests During Hospitalization: None Spiritual Requests During Hospitalization: None Consults Spiritual Care Consult Needed: No Transition of Care Team Consult Needed: No Advance Directives (For Healthcare) Does Patient Have a Medical Advance Directive?: No Would patient like information on creating a medical advance directive?: No - Patient declined          Disposition:  Disposition Initial Assessment Completed for this Encounter: Yes Patient referred to: Other (Comment)  On Site Evaluation by:   Reviewed with Physician:    Opal Sidles 06/27/2020 4:52 PM

## 2020-06-27 NOTE — ED Notes (Addendum)
Mady Haagensen  [phone]    Feliz Beam called asking for an update on the patient. RN allowed pt to speak with Feliz Beam.

## 2020-06-27 NOTE — ED Notes (Signed)
Pt is extremely paranoid and tearful over being in ED and requiring an IV. Pt immediately tries to rip IV out but stops after asked to stop. Pt complains of pain at insertion site of IV, Will continue to monitor.

## 2020-06-28 ENCOUNTER — Encounter: Payer: Self-pay | Admitting: Psychiatry

## 2020-06-28 ENCOUNTER — Inpatient Hospital Stay
Admission: AD | Admit: 2020-06-28 | Discharge: 2020-07-08 | DRG: 833 | Disposition: A | Discharge status: Home / Self Care | Payer: Medicaid Other | Source: Intra-hospital | Attending: Behavioral Health | Admitting: Behavioral Health

## 2020-06-28 ENCOUNTER — Emergency Department: Payer: Medicaid Other

## 2020-06-28 DIAGNOSIS — Z888 Allergy status to other drugs, medicaments and biological substances status: Secondary | ICD-10-CM

## 2020-06-28 DIAGNOSIS — I10 Essential (primary) hypertension: Secondary | ICD-10-CM | POA: Diagnosis present

## 2020-06-28 DIAGNOSIS — Z818 Family history of other mental and behavioral disorders: Secondary | ICD-10-CM | POA: Diagnosis not present

## 2020-06-28 DIAGNOSIS — O131 Gestational [pregnancy-induced] hypertension without significant proteinuria, first trimester: Secondary | ICD-10-CM | POA: Diagnosis present

## 2020-06-28 DIAGNOSIS — Z9119 Patient's noncompliance with other medical treatment and regimen: Secondary | ICD-10-CM | POA: Diagnosis not present

## 2020-06-28 DIAGNOSIS — Z79899 Other long term (current) drug therapy: Secondary | ICD-10-CM | POA: Diagnosis not present

## 2020-06-28 DIAGNOSIS — F250 Schizoaffective disorder, bipolar type: Secondary | ICD-10-CM | POA: Diagnosis present

## 2020-06-28 DIAGNOSIS — Z3A13 13 weeks gestation of pregnancy: Secondary | ICD-10-CM | POA: Diagnosis not present

## 2020-06-28 DIAGNOSIS — Z9114 Patient's other noncompliance with medication regimen: Secondary | ICD-10-CM | POA: Diagnosis not present

## 2020-06-28 DIAGNOSIS — Z833 Family history of diabetes mellitus: Secondary | ICD-10-CM

## 2020-06-28 DIAGNOSIS — O0992 Supervision of high risk pregnancy, unspecified, second trimester: Secondary | ICD-10-CM

## 2020-06-28 DIAGNOSIS — O99341 Other mental disorders complicating pregnancy, first trimester: Secondary | ICD-10-CM | POA: Diagnosis present

## 2020-06-28 DIAGNOSIS — Z3490 Encounter for supervision of normal pregnancy, unspecified, unspecified trimester: Secondary | ICD-10-CM

## 2020-06-28 DIAGNOSIS — Z8249 Family history of ischemic heart disease and other diseases of the circulatory system: Secondary | ICD-10-CM

## 2020-06-28 DIAGNOSIS — O10911 Unspecified pre-existing hypertension complicating pregnancy, first trimester: Secondary | ICD-10-CM | POA: Diagnosis not present

## 2020-06-28 DIAGNOSIS — O418X10 Other specified disorders of amniotic fluid and membranes, first trimester, not applicable or unspecified: Secondary | ICD-10-CM | POA: Insufficient documentation

## 2020-06-28 DIAGNOSIS — Z91199 Patient's noncompliance with other medical treatment and regimen due to unspecified reason: Secondary | ICD-10-CM

## 2020-06-28 LAB — RESPIRATORY PANEL BY RT PCR (FLU A&B, COVID)
Influenza A by PCR: NEGATIVE
Influenza B by PCR: NEGATIVE
SARS Coronavirus 2 by RT PCR: NEGATIVE

## 2020-06-28 MED ORDER — AMLODIPINE BESYLATE 5 MG PO TABS
5.0000 mg | ORAL_TABLET | Freq: Every day | ORAL | Status: DC
  Administered 2020-06-29 – 2020-07-08 (×10): 5 mg via ORAL
  Filled 2020-06-28 (×10): qty 1

## 2020-06-28 MED ORDER — LORAZEPAM 2 MG/ML IJ SOLN
INTRAMUSCULAR | Status: AC
  Administered 2020-06-28: 2 mg via INTRAMUSCULAR
  Filled 2020-06-28: qty 1

## 2020-06-28 MED ORDER — ACETAMINOPHEN 325 MG PO TABS: 650.0000 mg | ORAL_TABLET | Freq: Four times a day (QID) | ORAL | Status: DC | PRN

## 2020-06-28 MED ORDER — LORAZEPAM 2 MG/ML IJ SOLN: 0.5000 mg | Freq: Two times a day (BID) | INTRAMUSCULAR | Status: DC | PRN

## 2020-06-28 MED ORDER — METOCLOPRAMIDE HCL 10 MG PO TABS
10.0000 mg | ORAL_TABLET | Freq: Three times a day (TID) | ORAL | Status: DC | PRN
  Filled 2020-06-28: qty 1

## 2020-06-28 MED ORDER — AMOXICILLIN 500 MG PO CAPS
500.0000 mg | ORAL_CAPSULE | Freq: Three times a day (TID) | ORAL | Status: DC
  Administered 2020-06-28 – 2020-07-05 (×17): 500 mg via ORAL
  Filled 2020-06-28 (×24): qty 1

## 2020-06-28 MED ORDER — HALOPERIDOL LACTATE 5 MG/ML IJ SOLN: 5.0000 mg | Freq: Once | INTRAMUSCULAR | Status: DC

## 2020-06-28 MED ORDER — LABETALOL HCL 200 MG PO TABS
200.0000 mg | ORAL_TABLET | Freq: Two times a day (BID) | ORAL | Status: DC
  Administered 2020-06-28 – 2020-06-30 (×3): 200 mg via ORAL
  Filled 2020-06-28 (×8): qty 1

## 2020-06-28 MED ORDER — LORAZEPAM 2 MG/ML IJ SOLN: 2.0000 mg | Freq: Once | INTRAMUSCULAR | Status: AC

## 2020-06-28 MED ORDER — MAGNESIUM HYDROXIDE 400 MG/5ML PO SUSP: 30.0000 mL | Freq: Every day | ORAL | Status: DC | PRN

## 2020-06-28 MED ORDER — LORAZEPAM 1 MG PO TABS: 0.5000 mg | ORAL_TABLET | Freq: Two times a day (BID) | ORAL | Status: DC | PRN

## 2020-06-28 MED ORDER — ASPIRIN 81 MG PO CHEW
81.0000 mg | CHEWABLE_TABLET | Freq: Every day | ORAL | Status: DC
  Administered 2020-06-29 – 2020-07-08 (×8): 81 mg via ORAL
  Filled 2020-06-28 (×9): qty 1

## 2020-06-28 MED ORDER — BENZTROPINE MESYLATE 1 MG PO TABS: 1.0000 mg | ORAL_TABLET | Freq: Every day | ORAL | Status: DC

## 2020-06-28 MED ORDER — ALUM & MAG HYDROXIDE-SIMETH 200-200-20 MG/5ML PO SUSP
30.0000 mL | ORAL | Status: DC | PRN
  Administered 2020-07-04: 30 mL via ORAL
  Filled 2020-06-28: qty 30

## 2020-06-28 MED ORDER — PRENATAL PLUS 27-1 MG PO TABS
1.0000 | ORAL_TABLET | Freq: Every day | ORAL | Status: DC
  Administered 2020-06-28: 1 via ORAL
  Filled 2020-06-28 (×3): qty 1

## 2020-06-28 NOTE — ED Notes (Signed)
Hourly rounding completed at this time, patient currently awake in hallway bed. No complaints, stable, and in no acute distress. Q15 minute rounds and monitoring via Rover and Officer to continue. 

## 2020-06-28 NOTE — ED Notes (Signed)
Dr. Katrinka Blazing states to start with Ativan and then wait a few minutes to assess how pt responds then admin haldol if appropriate. Will follow up

## 2020-06-28 NOTE — BHH Counselor (Signed)
Dr. Domingo Cocking approached CSW with concerns of the pt's safety. Per Dr. Domingo Cocking, in conversation it seemed that pt's children were possibly not supervised or with their father that was homeless.    CSWs met with patient to discuss who was with her children and where they were.  Per pt, the oldest child is with his father, the middle son is with his father and youngest child is with a family friend.  All children are with their respective fathers at their homes and in the home with the family friend.  Assunta Curtis, MSW, LCSW 06/28/2020 2:54 PM

## 2020-06-28 NOTE — ED Notes (Signed)
Pt takes her medication at this time after 25 minutes of talking to this nurse and security in attempt to have her take medication.

## 2020-06-28 NOTE — Consult Note (Signed)
Consult History and Physical   SERVICE: Obstetrics   Patient Name: Alyssa Mathis Patient MRN:   [id number]  CC: presented to ED via police escort d/t safety concerns at home.    HPI: Alyssa Mathis is a 34 y.o. [id number] with history of hypertension and multiple psychiatric diagnoses.  She presented to the ED via police escort after family members called for assistance at her residence.  Family members were concerned that she had not slept in over 2 days.  She is currently [redacted]w[redacted]d pregnant, with an EDD of 12/30/2020, dated by [facility] [name].     Review of Systems: positives in bold Unable to perform ROS   Past Obstetrical History: OB History    Gravida  6   Para  3   Term  1   Preterm  2   AB  2   Living  3     SAB  2   TAB      Ectopic      Multiple  0   Live Births  3           Past Gynecologic History: No LMP recorded. Patient is pregnant. LMP is unknown, was previously on depo provera for birth control in 2020.    Past Medical History: Past Medical History:  Diagnosis Date  . Anemia   . Hypertension   . Pregnancy induced hypertension     Past Surgical History:   Past Surgical History:  Procedure Laterality Date  . NO PAST SURGERIES      Family History:  family history includes Diabetes in her maternal grandfather; Hypertension in her maternal grandfather, maternal grandmother, and sister.  Social History:  reports that she has never smoked. She has never used smokeless tobacco. She reports that she does not drink alcohol.  Home Medications:  Medications reconciled in EPIC  No current facility-administered medications on file prior to encounter.   Current Outpatient Medications on File Prior to Encounter  Medication Sig Dispense Refill  . amLODipine (NORVASC) 5 MG tablet Take 5 mg by mouth daily.    [name] amoxicillin (AMOXIL) 500 MG capsule Take 1 capsule (500 mg total) by mouth 3 (three) times daily. 30 capsule 0  . benztropine (COGENTIN) 1 MG tablet  Take 1 mg by mouth 2 (two) times daily.    [name] DICLEGIS 10-10 MG TBEC SMARTSIG:1 By Mouth 4-5 Times Daily    . FEROSUL 325 (65 Fe) MG tablet Take 325 mg by mouth every morning.    . hydrOXYzine (ATARAX/VISTARIL) 25 MG tablet Take 50 mg by mouth every 8 (eight) hours as needed.    . hydrOXYzine (ATARAX/VISTARIL) 25 MG tablet Take 1 tablet (25 mg total) by mouth every 8 (eight) hours as needed for up to 15 doses for anxiety. 12 tablet 0  . labetalol (NORMODYNE) 200 MG tablet Take 200 mg by mouth 2 (two) times daily.    . metoCLOPramide (REGLAN) 10 MG tablet Take 1 tablet (10 mg total) by mouth every 8 (eight) hours as needed for nausea. 30 tablet 0  . Oxcarbazepine (TRILEPTAL) 300 MG tablet Take 300 mg by mouth 2 (two) times daily.    Burnis Medin w/o A-FE-Methfol-FA-DHA (PNV-DHA) 27-0.6-0.4-300 MG CAPS Take 1 capsule by mouth daily.    . promethazine (PHENERGAN) 25 MG tablet Take 25 mg by mouth every 6 (six) hours as needed.    . risperidone (RISPERDAL) 4 MG tablet Take 4 mg by mouth at bedtime.      Allergies:  Allergies  Allergen Reactions  . Nifedipine     Other reaction(s): Unknown  . Paliperidone Other (See Comments)    Dystonia    Physical Exam:  Temp:  [97.6 F (36.4 C)-99 F (37.2 C)] 97.6 F (36.4 C) (10/12 1200) Pulse Rate:  [84-145] 99 (10/12 1200) Resp:  [16-20] 18 (10/12 1200) BP: (131-175)/(86-111) 132/98 (10/12 1200) SpO2:  [95 %-100 %] 100 % (10/12 1200)  Physical exam deferred    Labs/Studies:   CBC and Coags:  Lab Results  Component Value Date   WBC 7.4 06/27/2020   NEUTOPHILPCT 65 03/16/2020   EOSPCT 1 03/16/2020   BASOPCT 0 03/16/2020   LYMPHOPCT 25 03/16/2020   HGB 11.7 (L) 06/27/2020   HCT 32.7 (L) 06/27/2020   MCV 88.1 06/27/2020   PLT 235 06/27/2020   CMP:  Lab Results  Component Value Date   NA 137 06/27/2020   K 3.1 (L) 06/27/2020   CL 104 06/27/2020   CO2 21 (L) 06/27/2020   BUN 8 06/27/2020   CREATININE 0.69 06/27/2020   CREATININE 0.86  05/11/2020   CREATININE 0.93 03/16/2020   PROT 8.2 (H) 06/27/2020   BILITOT 0.9 06/27/2020   ALT 21 06/27/2020   AST 30 06/27/2020   ALKPHOS 53 06/27/2020   Other Imaging: US OB Comp Less 14 Wks  Result Date: 06/28/2020 CLINICAL DATA:  Pain following pelvic trauma EXAM: OBSTETRIC <14 WK ULTRASOUND TECHNIQUE: Transabdominal ultrasound was performed for evaluation of the gestation as well as the maternal uterus and adnexal regions. COMPARISON:  May 11, 2020 FINDINGS: Intrauterine gestational sac: Visualized Yolk sac:  Not visualized Embryo:  Not visualized Cardiac Activity: Visualized Heart Rate: 157 bpm CRL:   74 mm   13 w 4 d                  [name] EDC: December 30, 2020 Subchorionic hemorrhage:  None visualized. Maternal uterus/adnexae: Cervical os is closed. Placental tissue is seen anteriorly and fundal without abruption or previa evident. Amniotic fluid volume appears normal for gestational age. Right ovary measures 2.4 x 1.5 x 3.3 cm. Left ovary measures 3.5 x 2.0 x 2.6 cm. No extrauterine pelvic mass or free fluid. IMPRESSION: Single live intrauterine gestation with estimated gestational age of 13+ weeks. Fetal growth is commensurate with findings from prior sonographic evaluation. No placental abnormality. Amniotic fluid volume within normal limits. Cervical os closed. No subchorionic hemorrhage. No extrauterine pelvic mass or fluid evident. Electronically Signed   By: Bretta Bang III M.D.   On: 06/28/2020 10:15     Assessment / Plan:   Alyssa Mathis is a 34 y.o. [id number] who presents with high risk pregnancy and schizoaffective disorder   1. Currently admitted inpatient to behavioral health unit.   2. BP was previously elevated to 165/85 but now 135/98.  Discussed management goals for hypertension in pregnancy.  Recommend treatment with beta blockers for severe range pressures of 160/110 or greater.   3. Discussed medication management and concerns to pregnancy.  Treatment goals  should include stabilization of maternal mental health, achieving stabilization in the lowest dose possible.  Risks/benefits of fetal medication exposure discussed.  Will continue with recommendations for risperidone given that it has worked well for Jamisha in the past.   4. Using intermittent ativan as needed.  5. Recommend calling [facility] for consultation and possible transfer d/t Eveline's mental health concerns and high risk pregnancy.   6. Consult to MFM placed for hypertension and fetal medication exposure if  unable to transfer to [facility].     Thank you for the opportunity to be involved with this patient's care.  ----- Margaretmary Eddy, CNM Midwife [facility], Department of OB/GYN [facility]

## 2020-06-28 NOTE — ED Notes (Signed)
Hourly rounding completed at this time, patient currently asleep in room. No complaints, stable, and in no acute distress. Q15 minute rounds and monitoring via Rover and Officer to continue. 

## 2020-06-28 NOTE — ED Notes (Signed)
Pt in hallway again talking about childhood and history. Pt becomes upset to the point of yelling and screaming, uncontrollable with emotions. Pt can be heard throughout ED due to volume of wails and screams. MD aware of what is going on, orders to follow.

## 2020-06-28 NOTE — ED Notes (Signed)
Pt continues to not cooperate and unable to collect EKG and other orders. Will attempt when pt will allow.

## 2020-06-28 NOTE — ED Provider Notes (Signed)
Emergency Medicine Observation Re-evaluation Note  Alyssa Mathis is a 34 y.o. female, seen on rounds today.  Pt initially presented to the ED for complaints of IVC Currently, the patient is resting.  Physical Exam  BP (!) 155/111   Pulse (!) 105   Temp 99 F (37.2 C) (Oral)   Resp 16   Ht 5\' 6"  (1.676 m)   Wt 59 kg   SpO2 100%   BMI 20.98 kg/m  Physical Exam Constitutional:      Appearance: She is not ill-appearing or toxic-appearing.  Eyes:     Extraocular Movements: Extraocular movements intact.     Pupils: Pupils are equal, round, and reactive to light.  Cardiovascular:     Rate and Rhythm: Normal rate.  Pulmonary:     Effort: Pulmonary effort is normal.  Abdominal:     General: There is no distension.  Skin:    General: Skin is warm and dry.  Neurological:     General: No focal deficit present.     Cranial Nerves: No cranial nerve deficit.      ED Course / MDM  EKG:    I have reviewed the labs performed to date as well as medications administered while in observation.  Recent changes in the last 24 hours include continued bed search.  Plan  Current plan is for inpatient psychiatric care. Patient is under full IVC at this time.   , MD 06/28/20 [phone]

## 2020-06-28 NOTE — Progress Notes (Signed)
Pt is a 34 yr old female who is pregnant. 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 Pt states she has 3 children, all boys, who are ages 2, 50, and 33 years old. When asked what brought her here pt states she was praising God when her mouth got dry and she was on the phone with her David's cousin Francena Hanly. Pt states the cousin asked her "Are you okay?" Pt states she was out of breath and she was "battling the spirit, coming out of something." Pt shares this is when [name] stated she had woke the kids up and she said no. Pt said this is when [name] said "quit talking to me like that you're scaring me." The pt said she wasn't talk to him but to his spirit.  Pt appears paranoid/anxious and thoughts are disorganized. Pt has difficulty staying on track and answering the questions during assessment. Pt is preoccupied.   Skin assessment preformed upon admission. Skin is intact and w/o injury. Pt has one old scar on inside of Left arm in area of elbow.

## 2020-06-28 NOTE — BH Assessment (Signed)
Referral checks:    [facility] [phone] or [phone]) 6:23 AM Leighton Parody agreed to call back to follow up.   [facility] [phone]), No answer   [facility] [phone]), No answer   Earl[facility] Plater [phone]), Per Dorann Lodge there is no intake staff available. Left message [phone]  (Intake department) requesting call back.    [facility] Daniels [phone]). Left message   [facility] Cantor [phone]) 6:39 AM Per Misty Stanley, there are no female beds at this time. Asked for refax as there may be possible discharges today. Task completed 6:42 AM.

## 2020-06-28 NOTE — [facility] Suicide Risk Assessment (Signed)
[facility] Admission Suicide Risk Assessment   Nursing information obtained from:  Patient Demographic factors:  Low socioeconomic status, Unemployed Current Mental Status:  NA Loss Factors:  Decline in physical health Historical Factors:  Family history of mental illness or substance abuse Risk Reduction Factors:  Pregnancy, Responsible for children under 33 years of age  Total Time spent with patient: 1.5 hours Principal Problem: Schizoaffective disorder, bipolar type (HCC) Diagnosis:  Principal Problem:   Schizoaffective disorder, bipolar type (HCC) Active Problems:   Poor compliance   Pregnant   Hypertension  Subjective Data:  Alyssa Mathis was assessed at bedside. She is guarded and suspicious during exam. Her thought process is disorganized, tangential, and fairly difficult to follow. She currently exhibits great paranoia about the father of her two youngest children and current pregnancy. She feels that he lies and manipulates her and the system to make everyone think she is "crazy." She discusses that he often tries to threaten her with taking custody of his children and leaving her. She feels he doesn't love her because he didn't care when she wasn't eating and lost 10 pounds and wasn't changing cloths. She feels that he is having an affair with a woman in her old neighborhood.   She gives a bizarre account of being evicted from her apartment in August. She states after coming home from a beach trip she found feces in her toilet, the light in the bathroom left on, and magnets on her fridge spelling Trump. She states she blamed the maintenance worker, and tried to avoid paying rent. She states that this ultimately resulted in her getting evicted. She tells me the landlord was married to the Consulting civil engineer and they were conspiring against her, or that [name] planned the entire incident. She also discusses how one of her neighbors gave her a hard time, and how she took her to court as well.    Finally, when confronted about information in the IVC paperwork she states that she was simply having a religious experience. She said God was releasing her from all her emotions and grief, and she fell forward. She denies that she has any psychiatric diagnosis, or has ever been on a psychiatric medication. She states this was a planned and desired pregnancy, and she would never harm her child. She does admit to being on our unit, and at [facility] psychiatric units, but insists she has no psychiatric diagnosis. She was informed that she was placed on Risperdal to assist with her extreme emotions to try and prevent more falls. Explained that this is the safest medication to use during pregnancy, and she agrees to take this medication. She also requests that I call her mother, and both fathers of her respective children.   Contacted her mother, Randolm Idol, at [phone]. No answer, HIPPA compliant voicemail left. Sharon Mt returns the call on her phone. She states that her sister has bipolar disorder, and has been hospitalized twice before.   Contacted [name], father of Federico Flake, and unborn child at [phone]. He confirms that he has Davion and Havion, and is taking care of them. She states she had a breakdown in June, her sister got her and placed her in hospital. She ran out of medication, and he was unsure how to get refills. She began to decompensate. He initially thought it was mood swings from pregnancy. However, he became concerned when she stopped sleeping, having paranoid conversations that were disorganized, and starting drinking lots of caffeine. Yesterday she became irate, starting spitting  and hissing at him to the point it scared [name] and the children. He states he has been the one taking care of her and the children for quite sometime, but notes that she often feels paranoid about him when she gets ill.   Contacted Feliz Beam, father of Lorenz Coaster, [phone]. He is unsure what is  happening, but only that her mind is stressed and he is no longer taking medications. He does confirm that he has Laveon in his care at this time. He states he wants to help support Angelita in any way.   Continued Clinical Symptoms:    The "Alcohol Use Disorders Identification Test", Guidelines for Use in Primary Care, Second Edition.  World Science writer [facility]). Score between 0-7:  no or low risk or alcohol related problems. Score between 8-15:  moderate risk of alcohol related problems. Score between 16-19:  high risk of alcohol related problems. Score 20 or above:  warrants further diagnostic evaluation for alcohol dependence and treatment.   CLINICAL FACTORS:   Bipolar Disorder:   Mixed State Currently Psychotic Unstable or Poor Therapeutic Relationship Previous Psychiatric Diagnoses and Treatments Medical Diagnoses and Treatments/Surgeries   Musculoskeletal: Strength & Muscle Tone: within normal limits Gait & Station: normal Patient leans: N/A  Psychiatric Specialty Exam: Physical Exam Vitals and nursing note reviewed.  Constitutional:      Appearance: Normal appearance.  HENT:     Head: Normocephalic and atraumatic.     Right Ear: External ear normal.     Left Ear: External ear normal.     Nose: Nose normal.     Mouth/Throat:     Mouth: Mucous membranes are moist.     Pharynx: Oropharynx is clear.  Eyes:     Extraocular Movements: Extraocular movements intact.     Conjunctiva/sclera: Conjunctivae normal.     Pupils: Pupils are equal, round, and reactive to light.  Cardiovascular:     Rate and Rhythm: Normal rate.     Pulses: Normal pulses.  Pulmonary:     Effort: Pulmonary effort is normal.     Breath sounds: Normal breath sounds.  Abdominal:     Palpations: Abdomen is soft.     Tenderness: There is no guarding.  Musculoskeletal:        General: No swelling. Normal range of motion.     Cervical back: Normal range of motion and neck supple.  Skin:     General: Skin is warm and dry.  Neurological:     General: No focal deficit present.     Mental Status: She is alert.     Gait: Gait normal.  Psychiatric:        Attention and Perception: She does not perceive auditory or visual hallucinations.        Mood and Affect: Mood is anxious. Affect is flat.        Speech: Speech is rapid and pressured and tangential.        Behavior: Behavior is agitated.        Thought Content: Thought content is paranoid and delusional.        Cognition and Memory: Cognition is impaired. Memory is impaired.        Judgment: Judgment is impulsive.     Review of Systems  Constitutional: Positive for appetite change and unexpected weight change.  HENT: Negative for rhinorrhea and sore throat.   Eyes: Negative for photophobia and visual disturbance.  Respiratory: Negative for cough and shortness of breath.   Cardiovascular: Negative  for chest pain and palpitations.  Gastrointestinal: Positive for nausea. Negative for constipation, diarrhea and vomiting.  Endocrine: Negative for cold intolerance and heat intolerance.  Genitourinary: Negative for difficulty urinating and dyspareunia.  Musculoskeletal: Negative for back pain and neck pain.  Skin: Negative for rash and wound.  Allergic/Immunologic: Negative for food allergies and immunocompromised state.  Neurological: Negative for dizziness and headaches.  Hematological: Negative for adenopathy. Does not bruise/bleed easily.  Psychiatric/Behavioral: Positive for agitation, behavioral problems, confusion, decreased concentration and self-injury.    Blood pressure 119/86, pulse 88, temperature 97.6 F (36.4 C), temperature source Oral, resp. rate 18, height 5\' 6"  (1.676 m), weight 56.7 kg, SpO2 100 %, unknown if currently breastfeeding.Body mass index is 20.18 kg/m.  General Appearance: Guarded  Eye Contact:  Good  Speech:  Pressured  Volume:  Normal  Mood:  Anxious  Affect:  Constricted  Thought  Process:  Disorganized  Orientation:  Full (Time, Place, and Person)  Thought Content:  Delusions, Paranoid Ideation and Rumination  Suicidal Thoughts:  No  Homicidal Thoughts:  No  Memory:  Immediate;   Fair Recent;   Fair Remote;   Fair  Judgement:  Impaired  Insight:  Lacking  Psychomotor Activity:  Normal  Concentration:  Concentration: Poor and Attention Span: Fair  Recall:  of Knowledge:  Fair  Language:  Fair  Akathisia:  Negative  Handed:  Right  AIMS (if indicated):     Assets:  Communication Skills Desire for Improvement Physical Health Social Support  ADL's:  Intact  Cognition:  Impaired,  Mild  Sleep:          COGNITIVE FEATURES THAT CONTRIBUTE TO RISK:  Loss of executive function    SUICIDE RISK:   Moderate:  Frequent suicidal ideation with limited intensity, and duration, some specificity in terms of plans, no associated intent, good self-control, limited dysphoria/symptomatology, some risk factors present, and identifiable protective factors, including available and accessible social support.  PLAN OF CARE: Continue inpatient admission. OBGYN consult placed, and fetal ultrasound completed. Current medication regimen of Risperdal 4 mg QHS and Ativan 0.5 mg BID PRN discussed with and approved by consultant. Will place maternal fetal medicine consult for assistance with blood pressure control while she is on the unit.   I certify that inpatient services furnished can reasonably be expected to improve the patient's condition.   [employer], MD 06/28/2020, 3:04 PM

## 2020-06-28 NOTE — ED Notes (Signed)
Hourly rounding completed at this time, patient currently awake in room. No complaints, stable, and in no acute distress. Q15 minute rounds and monitoring via Rover and Officer to continue. °

## 2020-06-28 NOTE — ED Notes (Signed)
This nurse was speaking to another nurse regarding alarm on monitor beside desk. Pt asks this nurse to repeat what was said due to belief that we were speaking to her. When informed on what conversation was about pt states that this was not true and all "part of the plan."

## 2020-06-28 NOTE — BH Assessment (Signed)
Patient can come down at 11am  Call to give report: [phone]  Patient is to be admitted to [facility] by Dr. Toni Amend.  Attending Physician will be. Dr. Toni Amend.   Patient has been assigned to room 325, by [facility] Charge Nurse Lindie Spruce, RN.   Intake Paper Work has been signed and placed on patient chart.  ER staff is aware of the admission: 1. Misty Stanley, ER Secretary  2. Siadecki, ER MD  3. Lattie Corns Patient's Nurse  4. THO Patient Access.

## 2020-06-28 NOTE — ED Notes (Signed)
Ptient returned from U/S ate her breakfast and took her meds with no resistance. Patient calm and cooperative this morning. Phone provided and was speaking with family, patient cal after phone cal. Patient currently in shower. Change of clothing and oral hygiene supplies provided. Report called to lower level BH, awae patient will be coming to their unit soon.
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
## 2020-06-28 NOTE — ED Notes (Signed)
Off unit to u/s
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
## 2020-06-28 NOTE — ED Notes (Signed)
Pt willingly accepted IM injection. Security remains at bedside for safety. Pt sitting on side of bed

## 2020-06-28 NOTE — ED Notes (Signed)
Patient to be moved to [facility] report given to receiving nurse.

## 2020-06-28 NOTE — ED Notes (Signed)
Pt allows this nurse to collect covid swab for testing. Pt unable to hold head still to collect sample. Pt requests this nurse to place hand on back of head to assist with holding still. Able to collect sample. Pt still not allowing staff to collect EKG due to her paranoia.

## 2020-06-28 NOTE — Tx Team (Signed)
Initial Treatment Plan 06/28/2020 1:26 PM Alyssa Mathis QPY:[id number]    PATIENT STRESSORS: Marital or family conflict Other: depression   PATIENT STRENGTHS: Average or above average intelligence Communication skills General fund of knowledge Physical Health   PATIENT IDENTIFIED PROBLEMS: depression  puchosis                   DISCHARGE CRITERIA:  Ability to meet basic life and health needs Adequate post-discharge living arrangements Improved stabilization in mood, thinking, and/or behavior Verbal commitment to aftercare and medication compliance  PRELIMINARY DISCHARGE PLAN: Outpatient therapy Return to previous living arrangement  PATIENT/FAMILY INVOLVEMENT: This treatment plan has been presented to and reviewed with the patient, Alyssa Mathis,The patient has been given the opportunity to ask questions and make suggestions.  Chalmers Cater, RN 06/28/2020, 1:26 PM

## 2020-06-29 LAB — LIPID PANEL
Cholesterol: 182 mg/dL (ref 0–200)
HDL: 88 mg/dL (ref >40)
LDL Cholesterol: 82 mg/dL (ref 0–99)
Total CHOL/HDL Ratio: 2.1 RATIO
Triglycerides: 61 mg/dL (ref <150)
VLDL: 12 mg/dL (ref 0–40)

## 2020-06-29 LAB — HEMOGLOBIN A1C
Hgb A1c MFr Bld: 4.8 % (ref 4.8–5.6)
Mean Plasma Glucose: 91 mg/dL

## 2020-06-29 MED ORDER — FERROUS SULFATE 325 (65 FE) MG PO TABS
325.0000 mg | ORAL_TABLET | Freq: Every day | ORAL | Status: DC
  Administered 2020-06-30 – 2020-07-08 (×9): 325 mg via ORAL
  Filled 2020-06-29 (×9): qty 1

## 2020-06-29 MED ORDER — OLANZAPINE 5 MG PO TABS
2.5000 mg | ORAL_TABLET | Freq: Two times a day (BID) | ORAL | Status: DC
  Administered 2020-06-29 – 2020-07-01 (×5): 2.5 mg via ORAL
  Filled 2020-06-29 (×6): qty 1

## 2020-06-29 MED ORDER — ENSURE ENLIVE PO LIQD
237.0000 mL | Freq: Three times a day (TID) | ORAL | Status: DC
  Administered 2020-06-29 – 2020-07-07 (×23): 237 mL via ORAL

## 2020-06-29 MED ORDER — PRENATAL PLUS 27-1 MG PO TABS
1.0000 | ORAL_TABLET | Freq: Every morning | ORAL | Status: DC
  Administered 2020-06-29 – 2020-07-08 (×10): 1 via ORAL
  Filled 2020-06-29 (×12): qty 1

## 2020-06-29 MED ORDER — OLANZAPINE 5 MG PO TABS: 2.5000 mg | ORAL_TABLET | Freq: Four times a day (QID) | ORAL | Status: DC | PRN

## 2020-06-29 NOTE — Progress Notes (Signed)
D: Pt alert and oriented. Pt denies experiencing any anxiety/depression at this time.  Pt denies experiencing any pain at this time. Pt denies experiencing any SI/HI, or AVH at this time. Pt observed as being suspicious, paranoid, watchful, and hyper religious at times. Pt asked for her medications, the time they're to be given, and how much she is taken to be written down. Pt will ask multiple times within the same conversation what she is taken and what it's for. Pt can be very needy/demanding at times.   A: Scheduled medications administered to pt, per MD orders. Support and encouragement provided. Frequent verbal contact made. Routine safety checks conducted q15 minutes.   R: No adverse drug reactions noted. Pt verbally contracts for safety at this time. Pt complaint with medications. Pt interacts minimally with others on the unit. Pt remains safe at this time. Will continue to monitor.

## 2020-06-29 NOTE — Tx Team (Addendum)
Interdisciplinary Treatment and Diagnostic Plan Update  06/29/2020 Time of Session: 09:00 Alyssa Mathis MRN: [id number]  Principal Diagnosis: Schizoaffective disorder, bipolar type ([place])  Secondary Diagnoses: Principal Problem:   Schizoaffective disorder, bipolar type ([place]) Active Problems:   Poor compliance   Pregnant   Hypertension   Current Medications:  Current Facility-Administered Medications  Medication Dose Route Frequency Provider Last Rate Last Admin   acetaminophen (TYLENOL) tablet 650 mg  650 mg Oral Q6H PRN [name], [name], MD       alum & mag hydroxide-simeth (MAALOX/MYLANTA) 200-200-20 MG/5ML suspension 30 mL  30 mL Oral Q4H PRN [name], [name], MD       amLODipine (NORVASC) tablet 5 mg  5 mg Oral Q breakfast [name], [name], MD   5 mg at 06/29/20 0749   amoxicillin (AMOXIL) capsule 500 mg  500 mg Oral TID [name], [name], MD   500 mg at 06/29/20 0749   aspirin chewable tablet 81 mg  81 mg Oral Daily [name], [name], MD   81 mg at 06/29/20 0749   feeding supplement (ENSURE ENLIVE) (ENSURE ENLIVE) liquid 237 mL  237 mL Oral TID BM Salley Scarlet, MD   237 mL at 06/29/20 1023   [START ON 06/30/2020] ferrous sulfate tablet 325 mg  325 mg Oral Q breakfast Salley Scarlet, MD       labetalol (NORMODYNE) tablet 200 mg  200 mg Oral BID [name], [name], MD   200 mg at 06/28/20 1637   magnesium hydroxide (MILK OF MAGNESIA) suspension 30 mL  30 mL Oral Daily PRN [name], Madie [place]o, MD       metoCLOPramide (REGLAN) tablet 10 mg  10 mg Oral Q8H PRN [name], [name], MD       OLANZapine (ZYPREXA) tablet 2.5 mg  2.5 mg Oral BID Salley Scarlet, MD   2.5 mg at 06/29/20 1019   OLANZapine (ZYPREXA) tablet 2.5 mg  2.5 mg Oral Q6H PRN Salley Scarlet, MD       prenatal vitamin w/FE, FA (PRENATAL 1 + 1) 27-1 MG tablet 1 tablet  1 tablet Oral q AM Salley Scarlet, MD   1 tablet at 06/29/20 0901   PTA Medications: Medications Prior to Admission  Medication Sig Dispense  Refill Last Dose   amLODipine (NORVASC) 5 MG tablet Take 5 mg by mouth daily.      amoxicillin (AMOXIL) 500 MG capsule Take 1 capsule (500 mg total) by mouth 3 (three) times daily. 30 capsule 0    benztropine (COGENTIN) 1 MG tablet Take 1 mg by mouth 2 (two) times daily.      DICLEGIS 10-10 MG TBEC SMARTSIG:1 By Mouth 4-5 Times Daily      FEROSUL 325 (65 Fe) MG tablet Take 325 mg by mouth every morning.      hydrOXYzine (ATARAX/VISTARIL) 25 MG tablet Take 50 mg by mouth every 8 (eight) hours as needed.      hydrOXYzine (ATARAX/VISTARIL) 25 MG tablet Take 1 tablet (25 mg total) by mouth every 8 (eight) hours as needed for up to 15 doses for anxiety. 12 tablet 0    labetalol (NORMODYNE) 200 MG tablet Take 200 mg by mouth 2 (two) times daily.      metoCLOPramide (REGLAN) 10 MG tablet Take 1 tablet (10 mg total) by mouth every 8 (eight) hours as needed for nausea. 30 tablet 0    Oxcarbazepine (TRILEPTAL) 300 MG tablet Take 300 mg by mouth 2 (two) times  daily.      Prenat w/o A-FE-Methfol-FA-DHA (PNV-DHA) 27-0.6-0.4-300 MG CAPS Take 1 capsule by mouth daily.      promethazine (PHENERGAN) 25 MG tablet Take 25 mg by mouth every 6 (six) hours as needed.      risperidone (RISPERDAL) 4 MG tablet Take 4 mg by mouth at bedtime.       Patient Stressors: Marital or family conflict Other: depression  Patient Strengths: Average or above average intelligence Communication skills General fund of knowledge Physical Health  Treatment Modalities: Medication Management, Group therapy, Case management,  1 to 1 session with clinician, Psychoeducation, Recreational therapy.   Physician Treatment Plan for Primary Diagnosis: Schizoaffective disorder, bipolar type ([place]) Long Term Goal(s): Improvement in symptoms so as ready for discharge Improvement in symptoms so as ready for discharge   Short Term Goals: Ability to identify changes in lifestyle to reduce recurrence of condition will  improve Ability to verbalize feelings will improve Ability to disclose and discuss suicidal ideas Ability to demonstrate self-control will improve Ability to identify and develop effective coping behaviors will improve Ability to maintain clinical measurements within normal limits will improve Compliance with prescribed medications will improve Ability to identify changes in lifestyle to reduce recurrence of condition will improve Ability to verbalize feelings will improve Ability to disclose and discuss suicidal ideas Ability to demonstrate self-control will improve Ability to identify and develop effective coping behaviors will improve Ability to maintain clinical measurements within normal limits will improve Compliance with prescribed medications will improve  Medication Management: Evaluate patient's response, side effects, and tolerance of medication regimen.  Therapeutic Interventions: 1 to 1 sessions, Unit Group sessions and Medication administration.  Evaluation of Outcomes: Not Met  Physician Treatment Plan for Secondary Diagnosis: Principal Problem:   Schizoaffective disorder, bipolar type ([place]) Active Problems:   Poor compliance   Pregnant   Hypertension  Long Term Goal(s): Improvement in symptoms so as ready for discharge Improvement in symptoms so as ready for discharge   Short Term Goals: Ability to identify changes in lifestyle to reduce recurrence of condition will improve Ability to verbalize feelings will improve Ability to disclose and discuss suicidal ideas Ability to demonstrate self-control will improve Ability to identify and develop effective coping behaviors will improve Ability to maintain clinical measurements within normal limits will improve Compliance with prescribed medications will improve Ability to identify changes in lifestyle to reduce recurrence of condition will improve Ability to verbalize feelings will improve Ability to disclose and  discuss suicidal ideas Ability to demonstrate self-control will improve Ability to identify and develop effective coping behaviors will improve Ability to maintain clinical measurements within normal limits will improve Compliance with prescribed medications will improve     Medication Management: Evaluate patient's response, side effects, and tolerance of medication regimen.  Therapeutic Interventions: 1 to 1 sessions, Unit Group sessions and Medication administration.  Evaluation of Outcomes: Not Met   RN Treatment Plan for Primary Diagnosis: Schizoaffective disorder, bipolar type ([place]) Long Term Goal(s): Knowledge of disease and therapeutic regimen to maintain health will improve  Short Term Goals: Ability to verbalize frustration and anger appropriately will improve, Ability to demonstrate self-control, Ability to identify and develop effective coping behaviors will improve and Compliance with prescribed medications will improve  Medication Management: RN will administer medications as ordered by provider, will assess and evaluate patient's response and provide education to patient for prescribed medication. RN will report any adverse and/or side effects to prescribing provider.  Therapeutic Interventions: 1 on 1  counseling sessions, Psychoeducation, Medication administration, Evaluate responses to treatment, Monitor vital signs and CBGs as ordered, Perform/monitor CIWA, COWS, AIMS and Fall Risk screenings as ordered, Perform wound care treatments as ordered.  Evaluation of Outcomes: Not Met   LCSW Treatment Plan for Primary Diagnosis: Schizoaffective disorder, bipolar type ([place]) Long Term Goal(s): Safe transition to appropriate next level of care at discharge, Engage patient in therapeutic group addressing interpersonal concerns.  Short Term Goals: Engage patient in aftercare planning with referrals and resources, Increase social support, Increase ability to appropriately verbalize  feelings, Increase emotional regulation, Facilitate acceptance of mental health diagnosis and concerns, Identify triggers associated with mental health/substance abuse issues and Increase skills for wellness and recovery  Therapeutic Interventions: Assess for all discharge needs, 1 to 1 time with Social worker, Explore available resources and support systems, Assess for adequacy in community support network, Educate family and significant other(s) on suicide prevention, Complete Psychosocial Assessment, Interpersonal group therapy.  Evaluation of Outcomes: Not Met   Progress in Treatment: Attending groups: No. Participating in groups: No. Taking medication as prescribed: No. Toleration medication: No. Family/Significant other contact made: No, will contact:  when given permission.  Patient understands diagnosis: No. Discussing patient identified problems/goals with staff: Yes. Medical problems stabilized or resolved: Yes. Denies suicidal/homicidal ideation: Yes. Issues/concerns per patient self-inventory: No. Other: none  New problem(s) identified: No, Describe:  none  New Short Term/Long Term Goal(s): elimination of symptoms of psychosis, medication management for mood stabilization; development of comprehensive mental wellness/sobriety plan.   Patient Goals: "Give me something for my weight gain."  Discharge Plan or Barriers: CSW will continue to assess and develop plans with patient, however, patient remains disorganized.   Reason for Continuation of Hospitalization: Delusions  Medical Issues Medication stabilization  Estimated Length of Stay: 1-7  Recreational Therapy: Patient Stressors: N/A Patient Goal: Patient will engage in groups without prompting or encouragement from LRT x3 group sessions within 5 recreation therapy group sessions.  Attendees: Patient: Alyssa Mathis 06/29/2020 11:23 AM  Physician: Selina Cooley, MD 06/29/2020 11:23 AM  Nursing:  06/29/2020 11:23  AM  RN Care Manager: 06/29/2020 11:23 AM  Social Worker: Chalmers Guest. Reita May 06/29/2020 11:23 AM  Recreational Therapist: Devin Going, LRT 06/29/2020 11:23 AM  Other: Assunta Curtis, LCSW 06/29/2020 11:23 AM  Other:  06/29/2020 11:23 AM  Other: 06/29/2020 11:23 AM    Scribe for Treatment Team: Shirl Harris, LCSW 06/29/2020 11:23 AM

## 2020-06-29 NOTE — [facility] Group Notes (Signed)
06/29/2020 3:04 PM  Type of Therapy/Topic:  Group Therapy:  Emotion Regulation  Participation Level:  Did Not Attend   Description of Group:   The purpose of this group is to assist patients in learning to regulate negative emotions and experience positive emotions. Patients will be guided to discuss ways in which they have been vulnerable to their negative emotions. These vulnerabilities will be juxtaposed with experiences of positive emotions or situations, and patients will be challenged to use positive emotions to combat negative ones. Special emphasis will be placed on coping with negative emotions in conflict situations, and patients will process healthy conflict resolution skills.  Therapeutic Goals: 1. Patient will identify two positive emotions or experiences to reflect on in order to balance out negative emotions 2. Patient will label two or more emotions that they find the most difficult to experience 3. Patient will demonstrate positive conflict resolution skills through discussion and/or role plays  Summary of Patient Progress: X   Therapeutic Modalities:   Cognitive Behavioral Therapy Feelings Identification Dialectical Behavioral Therapy  Adelita Hone R. Algis Greenhouse, MSW, LCSW, LCAS 06/29/2020 3:04 PM

## 2020-06-29 NOTE — Progress Notes (Signed)
Recreation Therapy Notes  INPATIENT RECREATION THERAPY ASSESSMENT  Patient Details Name: Alyssa Mathis MRN: [id number] DOB: 03/30/1986 Today's Date: 06/29/2020       Information Obtained From:  (Patient refused assessment.)  Able to Participate in Assessment/Interview:    Patient Presentation:    Reason for Admission (Per Patient):    Patient Stressors:    Coping Skills:      Leisure Interests (2+):     Frequency of Recreation/Participation:    Awareness of Community Resources:     [employer]:     Current Use:    If no, Barriers?:    Expressed Interest in [employer] Information:    [place] of Residence:     Patient Main Form of Transportation:    Patient Strengths:     Patient Identified Areas of Improvement:     Patient Goal for Hospitalization:     Current SI (including self-harm):     Current HI:     Current AVH:    Staff Intervention Plan:    Consent to Intern Participation:    Burnis Halling 06/29/2020, 1:42 PM

## 2020-06-29 NOTE — Progress Notes (Signed)
Recreation Therapy Notes  Date: 06/29/2020  Time: 9:30 am   Location: Craft room     Behavioral response: N/A   Intervention Topic: Problem Solving     Discussion/Intervention: Patient did not attend group.   Clinical Observations/Feedback:  Patient did not attend group.   Alazia Crocket LRT/CTRS        Emberlie Gotcher 06/29/2020 1:37 PM

## 2020-06-29 NOTE — Progress Notes (Signed)
Patient alert and oriented x 3 with periods of confusion to situation, her affect is blunted thoughts are disorganized and incoherent, she denies SI/HI/AVH but appears responding to internal stimuli. Patient was motes suspicious of staff not interacting appropriately with peers and staff. Patient is not complaint with medication regimen she refused it and she became suspicious of staff thinking staff will poison her. Patent was offered emotional support, 15 minutes safety checks maintained will continue to monitor.

## 2020-06-29 NOTE — Progress Notes (Signed)
[facility] MD Progress Note  06/29/2020 9:54 AM Alyssa Mathis  MRN:  [id number]   Subjective:  Alyssa Mathis was seen during treatment team, and one on one in the hallway. This morning she is elevated, hyper religious, and has pressured speech. She is extremely difficult to interrupt on exam. She also remains paranoid about Alyssa Mathis. She continues to state that she has no mental health diagnosis, and that Alyssa Mathis has conspire against her to lock her up in the hospital and take her children away. She states that a doctor has told her she doesn't need medication. I believe she is referencing the discontinuation of Gean Birchwood due to dystonic reaction. She states she will not take Risperdal because she has had bad reactions in the past, and does not want to hurt her baby. She states her only goal is to gain weight and get her appetite back. We discussed starting Olanzapine today instead due to added benefit of reducing nausea, increasing appetite, and helping her emotions be more steady to help her and baby. Attempted to discuss her elevated blood pressure today, but she states that she has no history of high blood pressure or any current hypertension. She is extremely paranoid that staff is trying to poison her. She is aware that she takes labetalol and Norvasc, and is agreeable to this medication. In order to preserve therapeutic alliance, this provider did not clarify that labetalol and Norvasc were for blood pressure. Prenatal vitamin moved to the morning per patient request.   Elmon Kirschner at [phone]. He states that her mother hasn't been part of her life for some time since mental health diagnosis. She hasn't been in her or grandchild life for months. Alyssa Mathis feels that some of her paranoia stems from reaching out to family, and not getting support. He is open to speaking to Alyssa Mathis and letting her speak to children. Patient phone number provided to [facility]. He expresses concern about her lack of sleep and  increased appetite.   Principal Problem: Schizoaffective disorder, bipolar type (HCC) Diagnosis: Principal Problem:   Schizoaffective disorder, bipolar type (HCC) Active Problems:   Poor compliance   Pregnant   Hypertension  Total Time spent with patient: 45 minutes  Past Psychiatric History:  History of schizoaffective disorder, bipolar type. She was hospitalized most recently in May 2021, and stabalized on Risperdal 4 mg QHS. She has failed monotherapy with depakote, trilleptal, and Abilify in the past. She had a dystonic reaction to [place]. She was scheduled to follow-up with [facility] outpatient, but did not attend appointments.  She was also hospitalized at [facility] December of 2020 for 21 days.   Past Medical History:  Past Medical History:  Diagnosis Date  . Anemia   . Hypertension   . Pregnancy induced hypertension     Past Surgical History:  Procedure Laterality Date  . NO PAST SURGERIES     Family History:  Family History  Problem Relation Age of Onset  . Hypertension Sister   . Hypertension Maternal Grandmother   . Diabetes Maternal Grandfather   . Hypertension Maternal Grandfather    Family Psychiatric  History: Denies Social History:  Social History   Substance and Sexual Activity  Alcohol Use Never     Social History   Substance and Sexual Activity  Drug Use Not on file    Social History   Socioeconomic History  . Marital status: Single    Spouse name: Not on file  . Number of children: Not on file  .  Years of education: Not on file  . Highest education level: Not on file  Occupational History  . Not on file  Tobacco Use  . Smoking status: Never Smoker  . Smokeless tobacco: Never Used  Substance and Sexual Activity  . Alcohol use: Never  . Drug use: Not on file  . Sexual activity: Yes  Other Topics Concern  . Not on file  Social History Narrative  . Not on file   Social Determinants of Health   Financial Resource Strain:   .  Difficulty of Paying Living Expenses: Not on file  Food Insecurity:   . Worried About Programme researcher, broadcasting/film/video in the Last Year: Not on file  . Ran Out of Food in the Last Year: Not on file  Transportation Needs:   . Lack of Transportation (Medical): Not on file  . Lack of Transportation (Non-Medical): Not on file  Physical Activity:   . Days of Exercise per Week: Not on file  . Minutes of Exercise per Session: Not on file  Stress:   . Feeling of Stress : Not on file  Social Connections:   . Frequency of Communication with Friends and Family: Not on file  . Frequency of Social Gatherings with Friends and Family: Not on file  . Attends Religious Services: Not on file  . Active Member of Clubs or Organizations: Not on file  . Attends Banker Meetings: Not on file  . Marital Status: Not on file   Additional Social History:     Sleep: Fair  Appetite:  Poor  Current Medications: Current Facility-Administered Medications  Medication Dose Route Frequency Provider Last Rate Last Admin  . acetaminophen (TYLENOL) tablet 650 mg  650 mg Oral Q6H PRN [name], [name], MD      . alum & mag hydroxide-simeth (MAALOX/MYLANTA) 200-200-20 MG/5ML suspension 30 mL  30 mL Oral Q4H PRN [name], [name], MD      . amLODipine (NORVASC) tablet 5 mg  5 mg Oral Q breakfast [name], [name], MD   5 mg at 06/29/20 0749  . amoxicillin (AMOXIL) capsule 500 mg  500 mg Oral TID [name], [name], MD   500 mg at 06/29/20 0749  . aspirin chewable tablet 81 mg  81 mg Oral Daily [name], [name], MD   81 mg at 06/29/20 0749  . feeding supplement (ENSURE ENLIVE) (ENSURE ENLIVE) liquid 237 mL  237 mL Oral TID BM [name], MD      . Melene Muller ON 06/30/2020] ferrous sulfate tablet 325 mg  325 mg Oral Q breakfast [name], MD      . labetalol (NORMODYNE) tablet 200 mg  200 mg Oral BID [name], [name], MD   200 mg at 06/28/20 1637  . magnesium hydroxide (MILK OF MAGNESIA) suspension 30 mL  30 mL Oral Daily  PRN [name], [name], MD      . metoCLOPramide (REGLAN) tablet 10 mg  10 mg Oral Q8H PRN [name], [name], MD      . OLANZapine (ZYPREXA) tablet 2.5 mg  2.5 mg Oral BID [name], MD      . OLANZapine [facility]) tablet 2.5 mg  2.5 mg Oral Q6H PRN [name], MD      . prenatal vitamin w/FE, FA (PRENATAL 1 + 1) 27-1 MG tablet 1 tablet  1 tablet Oral q AM [name], MD   1 tablet at 06/29/20 0901    Lab Results:  Results for orders placed or performed during the hospital encounter of 06/27/20 (from the past 48 hour(s))  Comprehensive metabolic panel     Status: Abnormal   Collection Time: 06/27/20  1:22 PM  Result Value Ref Range   Sodium 137 135 - 145 mmol/L   Potassium 3.1 (L) 3.5 - 5.1 mmol/L   Chloride 104 98 - 111 mmol/L   CO2 21 (L) 22 - 32 mmol/L   Glucose, Bld 105 (H) 70 - 99 mg/dL    Comment: Glucose reference range applies only to samples taken after fasting for at least 8 hours.   BUN 8 6 - 20 mg/dL   Creatinine, Ser 1.91 0.44 - 1.00 mg/dL   Calcium 9.5 8.9 - 47.8 mg/dL   Total Protein 8.2 (H) 6.5 - 8.1 g/dL   Albumin 4.2 3.5 - 5.0 g/dL   AST 30 15 - 41 U/L   ALT 21 0 - 44 U/L   Alkaline Phosphatase 53 38 - 126 U/L   Total Bilirubin 0.9 0.3 - 1.2 mg/dL   GFR, Estimated >29 >56 mL/min   Anion gap 12 5 - 15    Comment: Performed at [facility], [address] [place], [place] [address]  Ethanol     Status: None   Collection Time: 06/27/20  1:22 PM  Result Value Ref Range   Alcohol, Ethyl (B) <10 <10 mg/dL    Comment: (NOTE) Lowest detectable limit for serum alcohol is 10 mg/dL.  For medical purposes only. Performed at [facility], [address] [place], [place] [address]   Salicylate level     Status: Abnormal   Collection Time: 06/27/20  1:22 PM  Result Value Ref Range   Salicylate Lvl <7.0 (L) 7.0 - 30.0 mg/dL    Comment: Performed at [facility], [address] [place], [place] [address]  Acetaminophen level      Status: Abnormal   Collection Time: 06/27/20  1:22 PM  Result Value Ref Range   Acetaminophen (Tylenol), Serum <10 (L) 10 - 30 ug/mL    Comment: (NOTE) Therapeutic concentrations vary significantly. A range of 10-30 ug/mL  may be an effective concentration for many patients. However, some  are best treated at concentrations outside of this range. Acetaminophen concentrations >150 ug/mL at 4 hours after ingestion  and >50 ug/mL at 12 hours after ingestion are often associated with  toxic reactions.  Performed at [facility], [address] [place], [place] [address]   cbc     Status: Abnormal   Collection Time: 06/27/20  1:22 PM  Result Value Ref Range   WBC 7.4 4.0 - 10.5 K/uL   RBC 3.71 (L) 3.87 - 5.11 MIL/uL   Hemoglobin 11.7 (L) 12.0 - 15.0 g/dL   HCT 32.4 (L) 36 - 46 %   MCV 88.1 80.0 - 100.0 fL   MCH 31.5 26.0 - 34.0 pg   MCHC 35.8 30.0 - 36.0 g/dL   RDW 40.1 02.7 - 25.3 %   Platelets 235 150 - 400 K/uL   nRBC 0.0 0.0 - 0.2 %    Comment: Performed at [facility], [address] [place], [place] [address]  hCG, quantitative, pregnancy     Status: Abnormal   Collection Time: 06/27/20  1:22 PM  Result Value Ref Range   hCG, Beta Chain, Quant, S 62,268 (H) <5 mIU/mL    Comment:          GEST. AGE  CONC.  (mIU/mL)   <=1 WEEK        5 - 50     2 WEEKS       50 - 500     3 WEEKS       100 - 10,000     4 WEEKS     1,000 - 30,000     5 WEEKS     3,500 - 115,000   6-8 WEEKS     12,000 - 270,000    12 WEEKS     15,000 - 220,000        FEMALE AND NON-PREGNANT FEMALE:     LESS THAN 5 mIU/mL Performed at [facility], [address] [place], [place] [address]   Respiratory Panel by RT PCR (Flu A&B, Covid) - Nasopharyngeal Swab     Status: None   Collection Time: 06/27/20  3:59 PM   Specimen: Nasopharyngeal Swab  Result Value Ref Range   SARS Coronavirus 2 by RT PCR NEGATIVE NEGATIVE    Comment: (NOTE) SARS-CoV-2 target nucleic acids  are NOT DETECTED.  The SARS-CoV-2 RNA is generally detectable in upper respiratoy specimens during the acute phase of infection. The lowest concentration of SARS-CoV-2 viral copies this assay can detect is 131 copies/mL. A negative result does not preclude SARS-Cov-2 infection and should not be used as the sole basis for treatment or other patient management decisions. A negative result may occur with  improper specimen collection/handling, submission of specimen other than nasopharyngeal swab, presence of viral mutation(s) within the areas targeted by this assay, and inadequate number of viral copies (<131 copies/mL). A negative result must be combined with clinical observations, patient history, and epidemiological information. The expected result is Negative.  Fact Sheet for Patients:  https://www.moore.com/  Fact Sheet for Healthcare Providers:  https://www.young.biz/  This test is no t yet approved or cleared by the [place] FDA and  has been authorized for detection and/or diagnosis of SARS-CoV-2 by FDA under an Emergency Use Authorization (EUA). This EUA will remain  in effect (meaning this test can be used) for the duration of the COVID-19 declaration under Section 564(b)(1) of the Act, 21 U.S.C. section 360bbb-3(b)(1), unless the authorization is terminated or revoked sooner.     Influenza A by PCR NEGATIVE NEGATIVE   Influenza B by PCR NEGATIVE NEGATIVE    Comment: (NOTE) The Xpert Xpress SARS-CoV-2/FLU/RSV assay is intended as an aid in  the diagnosis of influenza from Nasopharyngeal swab specimens and  should not be used as a sole basis for treatment. Nasal washings and  aspirates are unacceptable for Xpert Xpress SARS-CoV-2/FLU/RSV  testing.  Fact Sheet for Patients: https://www.moore.com/  Fact Sheet for Healthcare Providers: https://www.young.biz/  This test is not yet approved  or cleared by the [place] FDA and  has been authorized for detection and/or diagnosis of SARS-CoV-2 by  FDA under an Emergency Use Authorization (EUA). This EUA will remain  in effect (meaning this test can be used) for the duration of the  Covid-19 declaration under Section 564(b)(1) of the Act, 21  U.S.C. section 360bbb-3(b)(1), unless the authorization is  terminated or revoked. Performed at [facility], [address] [place], [place] [address]     Blood Alcohol level:  Lab Results  Component Value Date   [facility] <10 06/27/2020    Metabolic Disorder Labs: No results found for: HGBA1C, MPG No results found for: PROLACTIN No results found for: CHOL, TRIG, HDL, CHOLHDL, VLDL, LDLCALC  Physical  Findings: AIMS: Facial and Oral Movements Muscles of Facial Expression: None, normal Lips and Perioral Area: None, normal Jaw: None, normal Tongue: None, normal,Extremity Movements Upper (arms, wrists, hands, fingers): None, normal Lower (legs, knees, ankles, toes): None, normal, Trunk Movements Neck, shoulders, hips: None, normal, Overall Severity Severity of abnormal movements (highest score from questions above): None, normal Incapacitation due to abnormal movements: None, normal Patient's awareness of abnormal movements (rate only patient's report): No Awareness, Dental Status Current problems with teeth and/or dentures?: No Does patient usually wear dentures?: No  CIWA:    COWS:     Musculoskeletal: Strength & Muscle Tone: within normal limits Gait & Station: normal Patient leans: N/A  Psychiatric Specialty Exam: Physical Exam Constitutional:      Appearance: Normal appearance.  HENT:     Head: Normocephalic and atraumatic.     Nose: Nose normal.     Mouth/Throat:     Mouth: Mucous membranes are moist.     Pharynx: Oropharynx is clear.  Eyes:     Extraocular Movements: Extraocular movements intact.     Conjunctiva/sclera: Conjunctivae normal.      Pupils: Pupils are equal, round, and reactive to light.  Cardiovascular:     Rate and Rhythm: Normal rate.     Pulses: Normal pulses.  Pulmonary:     Effort: Pulmonary effort is normal.     Breath sounds: Normal breath sounds.  Abdominal:     General: Bowel sounds are normal.     Palpations: Abdomen is soft.  Musculoskeletal:        General: No swelling. Normal range of motion.     Cervical back: Normal range of motion and neck supple.  Skin:    General: Skin is warm and dry.  Neurological:     General: No focal deficit present.     Mental Status: She is alert.     Gait: Gait normal.  Psychiatric:        Attention and Perception: She is inattentive.        Mood and Affect: Mood is elated.        Speech: Speech is rapid and pressured and tangential.        Behavior: Behavior is agitated and hyperactive.        Thought Content: Thought content is paranoid and delusional.        Cognition and Memory: Cognition is impaired.        Judgment: Judgment is impulsive.     Review of Systems  Constitutional: Positive for appetite change. Negative for fever.  HENT: Negative for rhinorrhea and sore throat.   Eyes: Negative for photophobia and visual disturbance.  Respiratory: Negative for cough and shortness of breath.   Cardiovascular: Negative for chest pain and palpitations.  Gastrointestinal: Negative for diarrhea, nausea and vomiting.  Endocrine: Negative for cold intolerance and heat intolerance.  Genitourinary: Negative for difficulty urinating and dysuria.  Musculoskeletal: Negative for back pain and neck pain.  Skin: Negative for rash and wound.  Allergic/Immunologic: Negative for food allergies and immunocompromised state.  Neurological: Negative for dizziness and seizures.  Hematological: Negative for adenopathy. Does not bruise/bleed easily.  Psychiatric/Behavioral: Positive for agitation and behavioral problems. Negative for suicidal ideas. The patient is hyperactive.      Blood pressure (!) 137/101, pulse 91, temperature 98.3 F (36.8 C), temperature source Oral, resp. rate 17, height  (1.676 m), weight 56.7 kg, SpO2 100 %, unknown if currently breastfeeding.Body mass index is 20.18 kg/m.  General Appearance:  Fairly Groomed  Eye Contact:  Good  Speech:  Pressured  Volume:  Increased  Mood:  Euphoric  Affect:  Congruent  Thought Process:  Disorganized  Orientation:  Full (Time, Place, and Person)  Thought Content:  Delusions, Paranoid Ideation and Rumination  Suicidal Thoughts:  No  Homicidal Thoughts:  No  Memory:  Immediate;   Fair Recent;   Fair Remote;   Fair  Judgement:  Impaired  Insight:  Lacking  Psychomotor Activity:  Increased  Concentration:  Concentration: Poor and Attention Span: Fair  Recall:  [employer]is[employer]Fair  Fund of Knowledge:  Fair  Language:  Fair  Akathisia:  Negative  Handed:  Right  AIMS (if indicated):     Assets:  Communication Skills Desire for Improvement Resilience Social Support  ADL's:  Intact  Cognition:  Impaired,  Mild  Sleep:  Number of Hours: 6.25     Treatment Plan Summary: Daily contact with patient to assess and evaluate symptoms and progress in treatment, Medication management and Plan discontinue risperidal. Patient agreeable to taking Olanzapine due to side effect of weight gain and reduction of nausea. Diastolic blood pressure elevated today, labatelol on board. MFM consult for blood pressure in place.  Jesse SansMegan M Sariya Trickey, MD 06/29/2020, 9:54 AM

## 2020-06-29 NOTE — [facility] Counselor (Signed)
CSW attempted to meet with patient for PSA. She declined to give further information/participate in the assessment process.   Vilma Meckel. Algis Greenhouse, MSW, LCSW

## 2020-06-29 NOTE — Progress Notes (Signed)
Initial Nutrition Assessment  DOCUMENTATION CODES:   Not applicable  INTERVENTION:   Ensure Enlive po TID, each supplement provides 350 kcal and 20 grams of protein  Prenatal multivitamin daily   NUTRITION DIAGNOSIS:   Increased nutrient needs related to other (see comment) (pregnancy) as evidenced by increased estimated needs.  GOAL:   Patient will meet greater than or equal to 90% of their needs  MONITOR:   PO intake, Supplement acceptance  REASON FOR ASSESSMENT:   Malnutrition Screening Tool    ASSESSMENT:   34 y.o. female with a past medical history of anemia, HTN, schizoaffective disorder and is currently [redacted] weeks pregnant who is admitted under IVC with bizarre behavior  Per chart review, pt reports poor appetite and oral intake for several days pta. 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 Pt is also noted to have some nausea r/t her current pregnancy. Pt reports a 10lb recent weight loss. Per chart, pt appears to be down 15lbs(11%) since May; this is significant. Pt documented to be eating 100% of meals in hospital and is drinking Ensure supplements. Pt is already ordered for prenatal MVI. Agree with current nutritional plan.   Medications reviewed and include: amoxicillin, aspirin, ferrous sulfate, prenatal MVI  Labs reviewed:   Diet Order:   Diet Order            Diet regular Room service appropriate? Yes; Fluid consistency: Thin  Diet effective now                EDUCATION NEEDS:   Not appropriate for education at this time  Skin:  Skin Assessment: Reviewed RN Assessment  Last BM:  10/11  Height:   Ht Readings from Last 1 Encounters:  06/28/20 5\' 6"  (1.676 m)    Weight:   Wt Readings from Last 1 Encounters:  06/28/20 56.7 kg    Ideal Body Weight:  59 kg  BMI:  Body mass index is 20.18 kg/m.  Estimated Nutritional Needs:    Kcal:  2100-2300kcal/day  Protein:  100-110g/day  Fluid:  1.7-2L/day  08/28/20 MS, RD, LDN Please refer to [facility] for RD and/or RD on-call/weekend/after hours pager

## 2020-06-30 ENCOUNTER — Inpatient Hospital Stay: Payer: Medicaid Other | Attending: Maternal & Fetal Medicine

## 2020-06-30 DIAGNOSIS — O99341 Other mental disorders complicating pregnancy, first trimester: Principal | ICD-10-CM

## 2020-06-30 DIAGNOSIS — Z3A13 13 weeks gestation of pregnancy: Secondary | ICD-10-CM

## 2020-06-30 DIAGNOSIS — F250 Schizoaffective disorder, bipolar type: Secondary | ICD-10-CM

## 2020-06-30 DIAGNOSIS — O10911 Unspecified pre-existing hypertension complicating pregnancy, first trimester: Secondary | ICD-10-CM

## 2020-06-30 LAB — PROTEIN / CREATININE RATIO, URINE
Creatinine, Urine: 310 mg/dL
Protein Creatinine Ratio: 0.08 mg/mg{Cre} (ref 0.00–0.15)
Total Protein, Urine: 26 mg/dL

## 2020-06-30 NOTE — [facility] Counselor (Addendum)
CSW attempted to meet with patient for PSA. She declined to give further information/participate in the assessment process. When asked how she was doing, patient declined to respond and closed her eyes.   Vilma Meckel. Algis Greenhouse, MSW, LCSW Clinical Social Work/Disposition Phone: [phone] Fax: [phone]

## 2020-06-30 NOTE — Progress Notes (Signed)
Recreation Therapy Notes  Date: 06/30/2020  Time: 9:30 am   Location: Room 21   Behavioral response: N/A   Intervention Topic: Animal Assisted therapy    Discussion/Intervention: Patient did not attend group.   Clinical Observations/Feedback:  Patient did not attend group.   Bentlie Catanzaro LRT/CTRS        Dainelle Hun 06/30/2020 1:02 PM

## 2020-06-30 NOTE — Plan of Care (Signed)
Patient is suspecious about medications and staff but receptive with explanation.Patient is visible in the milieu but minimal interactions with staff & peers.Denies SI,HI and AVH.Compliant with medications.Appetite and energy level fair.Support and encouragement given.

## 2020-06-30 NOTE — Progress Notes (Signed)
Patient alert and oriented x 3 with periods of confusion to situation, her affect is blunted,  thoughts are disorganized, she's argumentative with staff and not receptive. Patient currently denies SI/HI/AVH but appears responding to internal stimuli. Patient was noted paranoid and suspicious of staff, she is not interacting appropriately, she's very irritable yelling at staff and using profanities.  Patient is not complaint with medication regimen she refused her scheduled evening medication, she  was offered emotional support and encouragement 15 minutes safety checks maintained will continue to monitor.

## 2020-06-30 NOTE — Progress Notes (Signed)
[facility] MD Progress Note  06/30/2020 12:09 PM Alyssa Mathis  MRN:  [id number]   Subjective:  Alyssa Mathis seen at bedside today. She is very guarded and internally preoccupied on exam. She did take Olanzapine 2.5 mg yesterday and this morning. She notes there is no change in her appetite, but the medicine is making her feel tired. She was noted to return to sleep after breakfast this morning for several hours. Discussed moving medication dose to evening to prevent fatigue, but she declined and became more suspicious of this provider. She also notes that she feels the ultrasound she got prior to admission was wrong, and she felt that it was a lie. She mentions she wants her own ultrasound at her own clinic. Discussed the results of ultrasound including appropriate size, appropriate fetal heart rate, and placental placement. She continues to feel suspicious, and is not appeased by this information. She continues to be suspicious of Alyssa Hua and his intentions, but no longer states she will refuse to return to this home. She is eating the majority of her meals, and drinking some ensures. She requests to be weighed today. She denies suicidal ideations, homicidal ideations, visual hallucinations, and auditory hallucinations at this time. Although, she appears internally preoccupid with auditory hallucinations, and exhibits thought blocking on exam.   Principal Problem: Schizoaffective disorder, bipolar type (HCC) Diagnosis: Principal Problem:   Schizoaffective disorder, bipolar type (HCC) Active Problems:   Poor compliance   Pregnant   Hypertension  Total Time spent with patient: 30 minutes  Past Psychiatric History: History of schizoaffective disorder, bipolar type. She was hospitalized most recently in May 2021, and stabalized on Risperdal 4 mg QHS. She has failed monotherapy with depakote, trilleptal, and Abilify in the past. She had a dystonic reaction to [place]. She was scheduled to follow-up with  [facility] outpatient, but did not attend appointments. She was also hospitalized at [facility] December of 2020 for 21 days  Past Medical History:  Past Medical History:  Diagnosis Date  . Anemia   . Hypertension   . Pregnancy induced hypertension     Past Surgical History:  Procedure Laterality Date  . NO PAST SURGERIES     Family History:  Family History  Problem Relation Age of Onset  . Hypertension Sister   . Hypertension Maternal Grandmother   . Diabetes Maternal Grandfather   . Hypertension Maternal Grandfather    Family Psychiatric  History: Denies Social History:  Social History   Substance and Sexual Activity  Alcohol Use Never     Social History   Substance and Sexual Activity  Drug Use Not on file    Social History   Socioeconomic History  . Marital status: Single    Spouse name: Not on file  . Number of children: Not on file  . Years of education: Not on file  . Highest education level: Not on file  Occupational History  . Not on file  Tobacco Use  . Smoking status: Never Smoker  . Smokeless tobacco: Never Used  Substance and Sexual Activity  . Alcohol use: Never  . Drug use: Not on file  . Sexual activity: Yes  Other Topics Concern  . Not on file  Social History Narrative  . Not on file   Social Determinants of Health   Financial Resource Strain:   . Difficulty of Paying Living Expenses: Not on file  Food Insecurity:   . Worried About Programme researcher, broadcasting/film/video in the Last Year: Not on file  .  Ran Out of Food in the Last Year: Not on file  Transportation Needs:   . Lack of Transportation (Medical): Not on file  . Lack of Transportation (Non-Medical): Not on file  Physical Activity:   . Days of Exercise per Week: Not on file  . Minutes of Exercise per Session: Not on file  Stress:   . Feeling of Stress : Not on file  Social Connections:   . Frequency of Communication with Friends and Family: Not on file  . Frequency of Social Gatherings with  Friends and Family: Not on file  . Attends Religious Services: Not on file  . Active Member of Clubs or Organizations: Not on file  . Attends Banker Meetings: Not on file  . Marital Status: Not on file   Additional Social History:     Sleep: Fair  Appetite:  Fair  Current Medications: Current Facility-Administered Medications  Medication Dose Route Frequency Provider Last Rate Last Admin  . acetaminophen (TYLENOL) tablet 650 mg  650 mg Oral Q6H PRN [name], [name], MD      . alum & mag hydroxide-simeth (MAALOX/MYLANTA) 200-200-20 MG/5ML suspension 30 mL  30 mL Oral Q4H PRN [name], [name], MD      . amLODipine (NORVASC) tablet 5 mg  5 mg Oral Q breakfast [name], [name], MD   5 mg at 06/30/20 0809  . amoxicillin (AMOXIL) capsule 500 mg  500 mg Oral TID [name], [name], MD   500 mg at 06/29/20 1450  . aspirin chewable tablet 81 mg  81 mg Oral Daily [name], [name], MD   81 mg at 06/29/20 0749  . feeding supplement (ENSURE ENLIVE) (ENSURE ENLIVE) liquid 237 mL  237 mL Oral TID BM [name], MD   237 mL at 06/29/20 1408  . ferrous sulfate tablet 325 mg  325 mg Oral Q breakfast [name], MD   325 mg at 06/30/20 0809  . labetalol (NORMODYNE) tablet 200 mg  200 mg Oral BID [name], [name], MD   200 mg at 06/29/20 1608  . magnesium hydroxide (MILK OF MAGNESIA) suspension 30 mL  30 mL Oral Daily PRN [name], [name], MD      . metoCLOPramide (REGLAN) tablet 10 mg  10 mg Oral Q8H PRN [name], [name], MD      . OLANZapine (ZYPREXA) tablet 2.5 mg  2.5 mg Oral BID [name], MD   2.5 mg at 06/30/20 0809  . OLANZapine (ZYPREXA) tablet 2.5 mg  2.5 mg Oral Q6H PRN [name], MD      . prenatal vitamin w/FE, FA (PRENATAL 1 + 1) 27-1 MG tablet 1 tablet  1 tablet Oral q AM [name], MD   1 tablet at 06/30/20 0809    Lab Results: No results found for this or any previous visit (from the past 48 hour(s)).  Blood Alcohol level:  Lab Results  Component  Value Date   ETH <10 06/27/2020    Metabolic Disorder Labs: Lab Results  Component Value Date   HGBA1C 4.8 06/27/2020   MPG 91 06/27/2020   No results found for: PROLACTIN Lab Results  Component Value Date   CHOL 182 06/27/2020   TRIG 61 06/27/2020   HDL 88 06/27/2020   CHOLHDL 2.1 06/27/2020   VLDL 12 06/27/2020   LDLCALC 82 06/27/2020    Physical Findings: AIMS: Facial and Oral Movements Muscles of Facial Expression: None, normal Lips and Perioral Area:  None, normal Jaw: None, normal Tongue: None, normal,Extremity Movements Upper (arms, wrists, hands, fingers): None, normal Lower (legs, knees, ankles, toes): None, normal, Trunk Movements Neck, shoulders, hips: None, normal, Overall Severity Severity of abnormal movements (highest score from questions above): None, normal Incapacitation due to abnormal movements: None, normal Patient's awareness of abnormal movements (rate only patient's report): No Awareness, Dental Status Current problems with teeth and/or dentures?: No Does patient usually wear dentures?: No  CIWA:   0 COWS:   0  Musculoskeletal: Strength & Muscle Tone: within normal limits Gait & Station: normal Patient leans: N/A  Psychiatric Specialty Exam: Physical Exam Constitutional:      Appearance: Normal appearance.  HENT:     Head: Normocephalic and atraumatic.     Nose: Nose normal.     Mouth/Throat:     Mouth: Mucous membranes are moist.     Pharynx: Oropharynx is clear.  Eyes:     Extraocular Movements: Extraocular movements intact.     Conjunctiva/sclera: Conjunctivae normal.     Pupils: Pupils are equal, round, and reactive to light.  Cardiovascular:     Rate and Rhythm: Normal rate.     Pulses: Normal pulses.  Pulmonary:     Effort: Pulmonary effort is normal.     Breath sounds: Normal breath sounds.  Abdominal:     Palpations: Abdomen is soft.     Tenderness: There is no abdominal tenderness.  Musculoskeletal:        General: No  swelling. Normal range of motion.     Cervical back: Normal range of motion and neck supple.  Skin:    General: Skin is warm and dry.  Neurological:     General: No focal deficit present.     Mental Status: She is alert.     Gait: Gait normal.  Psychiatric:        Attention and Perception: She perceives auditory hallucinations.        Mood and Affect: Affect is flat.        Speech: Speech is delayed.        Behavior: Behavior is withdrawn.        Thought Content: Thought content is paranoid and delusional.        Cognition and Memory: Memory normal. Cognition is impaired.        Judgment: Judgment is impulsive.     Review of Systems  Constitutional: Positive for appetite change and fatigue.  HENT: Negative for rhinorrhea and sore throat.   Eyes: Negative for photophobia and visual disturbance.  Respiratory: Negative for cough and shortness of breath.   Cardiovascular: Negative for chest pain and palpitations.  Gastrointestinal: Negative for abdominal pain, nausea and vomiting.  Endocrine: Negative for cold intolerance and heat intolerance.  Genitourinary: Negative for difficulty urinating and dysuria.  Musculoskeletal: Negative for back pain and neck pain.  Skin: Negative for rash and wound.  Allergic/Immunologic: Negative for food allergies and immunocompromised state.  Neurological: Negative for dizziness and seizures.  Hematological: Negative for adenopathy. Does not bruise/bleed easily.  Psychiatric/Behavioral: Positive for agitation, behavioral problems, confusion, dysphoric mood and hallucinations. Negative for suicidal ideas.    Blood pressure (!) 132/93, pulse 72, temperature 98.3 F (36.8 C), temperature source Oral, resp. rate 17, height 5\' 6"  (1.676 m), weight 56.7 kg, SpO2 100 %, unknown if currently breastfeeding.Body mass index is 20.18 kg/m.  General Appearance: Disheveled  Eye Contact:  intense glaring   Speech:  Blocked and Slow  Volume:  Decreased  Mood:  Irritable  Affect:  Flat  Thought Process:  Disorganized  Orientation:  Full (Time, Place, and Person)  Thought Content:  Delusions and Paranoid Ideation  Suicidal Thoughts:  No  Homicidal Thoughts:  No  Memory:  Immediate;   Fair Recent;   Fair Remote;   Fair  Judgement:  Impaired  Insight:  Lacking  Psychomotor Activity:  Normal  Concentration:  Concentration: Fair and Attention Span: Poor  Recall:  [employer]is[employer]Fair  Fund of Knowledge:  Fair  Language:  Fair  Akathisia:  Negative  Handed:  Right  AIMS (if indicated):     Assets:  Desire for Improvement Housing Social Support  ADL's:  Intact  Cognition:  Impaired,  Moderate  Sleep:  Number of Hours: 6.25     Treatment Plan Summary: Daily contact with patient to assess and evaluate symptoms and progress in treatment, Medication management and Plan continue medications as above.  Jesse SansMegan M Tata Timmins, MD 06/30/2020, 12:09 PM

## 2020-06-30 NOTE — Consult Note (Signed)
MFM Consultation   Date of service: 06/30/20 Reason for request: Hypertension in pregnancy Requesting provider: Les Pou, MD  Ms. Tellefsen is a 34 yo G6P3 at 13w 6d who is admitted in the behavioral health unit at [facility] due to schizoaffective disorder with a recent manic episode. She is being seen today at the request of Dr. Neale Burly.  During the interview Ms. Bonaventura was very suspicious and unwilling to answer any questions regarding her medical history. Therefore, this consultation is informed by the medical records, nurse and Dr. Neale Burly. She has had multiple admission for psychiatric stabilization.    She is was alert and oriented today however, she believes that her admission is the doing of her significant other [name] and that majority of her care and the people in her care are actors and not real.  However, she believes she feels good. She denies headache, vision changes or RUQ pain.  She doesn't believe she has hypertension, but is aware of the medications she is taking and was also able to identify those specific for hypertension.   She is taking Norvasc 2.5 mg daily and Labetalol 200 mg BID.   Vitals with BMI 06/30/2020 06/29/2020 06/29/2020  Height - - -  Weight - - -  BMI - - -  Systolic 132 114 401  Diastolic 93 74 101  Pulse 72 74 91   CMP Latest Ref Rng & Units 06/27/2020 05/11/2020 03/16/2020  Glucose 70 - 99 mg/dL 027(O) 536(U) 92  BUN 6 - 20 mg/dL 8 13 9   Creatinine 0.44 - 1.00 mg/dL 4.40 3.47  Sodium 135 - 145 mmol/L 137 138 140  Potassium 3.5 - 5.1 mmol/L 3.1(L) 3.3(L) 3.9  Chloride 98 - 111 mmol/L 104 104 104  CO2 22 - 32 mmol/L 21(L) 24 25  Calcium 8.9 - 10.3 mg/dL 9.5 9.5 9.4  Total Protein 6.5 - 8.1 g/dL 8.2(H) 7.7 8.3(H)  Total Bilirubin 0.3 - 1.2 mg/dL 0.9 1.2 0.9  Alkaline Phos 38 - 126 U/L 53 57 75  AST 15 - 41 U/L 30 15 16   ALT 0 - 44 U/L 21 12 13    OB History  Gravida Para Term Preterm AB Living  6 3 1 2 2 3   SAB TAB Ectopic Multiple Live  Births  2 0 0 0 3    # Outcome Date GA Lbr Len/2nd Weight Sex Delivery Anes PTL Lv  6 Current           5 Preterm 03/03/18 [redacted]w[redacted]d 01:48 / 00:08 2190 g M Vag-Spont EPI  LIV     Name: Knapke,BOY Desarai     Apgar1: 5  Apgar5: 8  4 Preterm 07/11/16 [redacted]w[redacted]d  1814 g M Vag-Spont   LIV  3 Term 01/11/15 [redacted]w[redacted]d 334:27 / 00:49 2935 g M Vag-Spont EPI  LIV     Birth Comments: Pre eclampsia just before delivery All prenatal U/S normal  Hgb, Normal, FA Newborn Screen Barcode: 07/13/16 Date Collected: 01/13/2015     Apgar1: 8  Apgar5: 9  2 SAB           1 SAB            Past Medical History:  Diagnosis Date  . Anemia   . Hypertension   . Pregnancy induced hypertension    Past Surgical History:  Procedure Laterality Date  . NO PAST SURGERIES     No current facility-administered medications on file prior to encounter.   Current Outpatient Medications on File Prior to  Encounter  Medication Sig Dispense Refill  . amLODipine (NORVASC) 5 MG tablet Take 5 mg by mouth daily.    [name] amoxicillin (AMOXIL) 500 MG capsule Take 1 capsule (500 mg total) by mouth 3 (three) times daily. 30 capsule 0  . benztropine (COGENTIN) 1 MG tablet Take 1 mg by mouth 2 (two) times daily.    [name] DICLEGIS 10-10 MG TBEC SMARTSIG:1 By Mouth 4-5 Times Daily    . FEROSUL 325 (65 Fe) MG tablet Take 325 mg by mouth every morning.    . hydrOXYzine (ATARAX/VISTARIL) 25 MG tablet Take 50 mg by mouth every 8 (eight) hours as needed.    . hydrOXYzine (ATARAX/VISTARIL) 25 MG tablet Take 1 tablet (25 mg total) by mouth every 8 (eight) hours as needed for up to 15 doses for anxiety. 12 tablet 0  . labetalol (NORMODYNE) 200 MG tablet Take 200 mg by mouth 2 (two) times daily.    . metoCLOPramide (REGLAN) 10 MG tablet Take 1 tablet (10 mg total) by mouth every 8 (eight) hours as needed for nausea. 30 tablet 0  . Oxcarbazepine (TRILEPTAL) 300 MG tablet Take 300 mg by mouth 2 (two) times daily.    Burnis Medin w/o A-FE-Methfol-FA-DHA (PNV-DHA)  27-0.6-0.4-300 MG CAPS Take 1 capsule by mouth daily.    . promethazine (PHENERGAN) 25 MG tablet Take 25 mg by mouth every 6 (six) hours as needed.    . risperidone (RISPERDAL) 4 MG tablet Take 4 mg by mouth at bedtime.     Social History   Socioeconomic History  . Marital status: Single    Spouse name: Not on file  . Number of children: Not on file  . Years of education: Not on file  . Highest education level: Not on file  Occupational History  . Not on file  Tobacco Use  . Smoking status: Never Smoker  . Smokeless tobacco: Never Used  Substance and Sexual Activity  . Alcohol use: Never  . Drug use: Not on file  . Sexual activity: Yes  Other Topics Concern  . Not on file  Social History Narrative  . Not on file   Social Determinants of Health   Financial Resource Strain:   . Difficulty of Paying Living Expenses: Not on file  Food Insecurity:   . Worried About Programme researcher, broadcasting/film/video in the Last Year: Not on file  . Ran Out of Food in the Last Year: Not on file  Transportation Needs:   . Lack of Transportation (Medical): Not on file  . Lack of Transportation (Non-Medical): Not on file  Physical Activity:   . Days of Exercise per Week: Not on file  . Minutes of Exercise per Session: Not on file  Stress:   . Feeling of Stress : Not on file  Social Connections:   . Frequency of Communication with Friends and Family: Not on file  . Frequency of Social Gatherings with Friends and Family: Not on file  . Attends Religious Services: Not on file  . Active Member of Clubs or Organizations: Not on file  . Attends Banker Meetings: Not on file  . Marital Status: Not on file  Intimate Partner Violence:   . Fear of Current or Ex-Partner: Not on file  . Emotionally Abused: Not on file  . Physically Abused: Not on file  . Sexually Abused: Not on file    Imaging: 06/28/20 demonstrated 13 w 4 d exam No evidence of subchorionic hemorrhage.  Impression/Counseling:  Ms. Petros has chronic hypertension in pregnancy defined by elevated blood pressure of >140/90 prior to [redacted] weeks gestation. On admission she had multiple blood pressures that from 130-170's/90-116. She more recently had blood pressure of 132/93 mmHg. She is stable on Norvasc and Labetalol.  Her initial labs are normal demonstrating no renal damage. A urinary protein creatinine ratio is not present at this time. A UPC would be helpful to determine change if her hypertension is exacerbated and needs to be distinguished from new onset preeclampsia.   Ms. Gruver has had preelampsia is in the past and has a 25-50% risk of recurrence. She is taking low dose aspirin daily.   At this time I recommend maintaining blood pressure < 150/100 if blood pressure increases above this number persistently we recommend increasing one blood pressure to achieved overall goal.   Personally we have had good success with Procardia however, due to uncertain allergy we recommend continuing Norvasc until we can obtain clarity about this allergy.   Overall their is an increased risk for placental abruption, superimposed preeclampsia, fetal growth restriction, inpatient admission with preterm delivery and maternal stroke if blood pressure is not managed.  As such we recommend serial growth every 4 weeks at 18-20 weeks, 28, 32 and [redacted] weeks gestation. Initiate weekly testing at 32 weeks unless fetal growth restriction is discovered.  I discussed the plan of care with Dr. Neale Burly.   I spent 45 minute with > 50% in face to face conversation and care coordination.   Novella Olive, MD

## 2020-06-30 NOTE — [facility] Group Notes (Signed)
LCSW Group Therapy Note  06/30/2020 2:35 PM  Type of Therapy/Topic:  Group Therapy:  Balance in Life  Participation Level:  Did Not Attend  Description of Group:    This group will address the concept of balance and how it feels and looks when one is unbalanced. Patients will be encouraged to process areas in their lives that are out of balance and identify reasons for remaining unbalanced. Facilitators will guide patients in utilizing problem-solving interventions to address and correct the stressor making their life unbalanced. Understanding and applying boundaries will be explored and addressed for obtaining and maintaining a balanced life. Patients will be encouraged to explore ways to assertively make their unbalanced needs known to significant others in their lives, using other group members and facilitator for support and feedback.  Therapeutic Goals: 1. Patient will identify two or more emotions or situations they have that consume much of in their lives. 2. Patient will identify signs/triggers that life has become out of balance:  3. Patient will identify two ways to set boundaries in order to achieve balance in their lives:  4. Patient will demonstrate ability to communicate their needs through discussion and/or role plays  Summary of Patient Progress: X    Therapeutic Modalities:   Cognitive Behavioral Therapy Solution-Focused Therapy Assertiveness Training  Alyssa Mathis R. Algis Greenhouse, MSW, LCSW, LCAS 06/30/2020 2:35 PM

## 2020-07-01 LAB — CHLAMYDIA/NGC RT PCR (ARMC ONLY)
Chlamydia Tr: NOT DETECTED
N gonorrhoeae: NOT DETECTED

## 2020-07-01 LAB — HIV ANTIBODY (ROUTINE TESTING W REFLEX): HIV Screen 4th Generation wRfx: NONREACTIVE

## 2020-07-01 MED ORDER — OLANZAPINE 10 MG IM SOLR: 5.0000 mg | Freq: Two times a day (BID) | INTRAMUSCULAR | Status: DC

## 2020-07-01 MED ORDER — OLANZAPINE 5 MG PO TABS: 5.0000 mg | ORAL_TABLET | Freq: Two times a day (BID) | ORAL | Status: DC

## 2020-07-01 MED ORDER — OLANZAPINE 5 MG PO TABS
5.0000 mg | ORAL_TABLET | Freq: Two times a day (BID) | ORAL | Status: DC
  Administered 2020-07-01 – 2020-07-08 (×14): 5 mg via ORAL
  Filled 2020-07-01 (×15): qty 1

## 2020-07-01 MED ORDER — LABETALOL HCL 200 MG PO TABS
200.0000 mg | ORAL_TABLET | Freq: Two times a day (BID) | ORAL | Status: DC
  Administered 2020-07-01 – 2020-07-08 (×14): 200 mg via ORAL
  Filled 2020-07-01 (×15): qty 1

## 2020-07-01 NOTE — Progress Notes (Signed)
Patient alert and oriented x 4, her affect is blunted, thoughts are disorganized, she's suspicious, religiously preoccupied and argumentative with staff. Patient is not receptive to staff, she was hostile and noted paranoid, using derogatory terms with Clinical research associate. Patient at one point was following the writer around even to other patient's room and yelling " The devil is a liar" patient was frequently redirected and told to stop disturbing the milieu but she was not redirectable, until security was involved and she was asked to go back to her room.  Patient is not interacting appropriately with peers, she's very irritable and intrusive   Patient currently denies SI/HI/AVH but appears responding to internal stimu Patient is not complaint with medication regimen she refused her scheduled evening medication, she  was offered emotional support and encouragement 15 minutes safety checks maintained will continue to monitor.

## 2020-07-01 NOTE — [facility] Suicide Risk Assessment (Signed)
[facility] INPATIENT:  Family/Significant Other Suicide Prevention Education  Suicide Prevention Education:  Education Completed; Alyssa Mathis/friend [phone]) has been identified by the patient as the family member/significant other with whom the patient will be residing, and identified as the person(s) who will aid the patient in the event of a mental health crisis (suicidal ideations/suicide attempt).  With written consent from the patient, the family member/significant other has been provided the following suicide prevention education, prior to the and/or following the discharge of the patient.  The suicide prevention education provided includes the following:  Suicide risk factors  Suicide prevention and interventions  National Suicide Hotline telephone number  [facility] assessment telephone number  [facility] Emergency Assistance 911  [facility] and/or Residential Mobile Crisis Unit telephone number  Request made of family/significant other to:  Remove weapons (e.g., guns, rifles, knives), all items previously/currently identified as safety concern.    Remove drugs/medications (over-the-counter, prescriptions, illicit drugs), all items previously/currently identified as a safety concern.  The family member/significant other verbalizes understanding of the suicide prevention education information provided.  The family member/significant other agrees to remove the items of safety concern listed above.  He states that he believes that she has just been under a significant amount of stress lately which makes her begin to focus on specific thoughts. He states that they have been talking about her childhood history which he feels that she needs to address. He denies her having any access to weapons or any thoughts that she would harm herself or anyone else.   Alyssa Mathis 07/01/2020, 3:58 PM

## 2020-07-01 NOTE — Progress Notes (Signed)
Recreation Therapy Notes   Date: 07/01/2020  Time: 9:30 am   Location: Craft room     Behavioral response: N/A   Intervention Topic: Stress Management     Discussion/Intervention: Patient did not attend group.   Clinical Observations/Feedback:  Patient did not attend group.   Tamera Pingley LRT/CTRS        Saron Vanorman 07/01/2020 1:26 PM

## 2020-07-01 NOTE — Plan of Care (Signed)
Patient needy and intrusive with staff  Problem: Education: Goal: Emotional status will improve Outcome: Not Progressing Goal: Mental status will improve Outcome: Not Progressing

## 2020-07-01 NOTE — Plan of Care (Signed)
Patient is more receptive with staff today.Continues to be suspecious about the medications and staff.Denies SI,HI and AVH.Compliant with medications.Attended groups.Appetite and energy level good.Support and encouragement given.

## 2020-07-01 NOTE — Progress Notes (Signed)
Patient intrusive with staff this evening, continuing to come up to the nurses station asking for anything she can have. Patient observed interacting appropriately with peers on the unit. Patient refused her night medication stating, "I don't take this medication, I will talk to my doctor tomorrow." Patient given education, support, and encouragement to be active in her treatment plan. Patient being monitored Q 15 minutes for safety per unit protocol. Patient remains safe on the unit.
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
## 2020-07-01 NOTE — Progress Notes (Signed)
Approached by Alyssa Mathis in the hallway. She asks to review her medications, doses, indications, and times along with her lab results. Provided a list of her medications and several lab results. She expresses concern that she may have a STD. She endorses burning with urination, and requests to be checked for gonorrhea, chlamydia, HSV, and HIV for the safety of her newborn. Labs ordered.
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
## 2020-07-02 LAB — RPR: RPR Ser Ql: NONREACTIVE

## 2020-07-02 NOTE — [facility] Group Notes (Signed)
[facility] Group Notes: (Clinical Social Work)   07/02/2020      Type of Therapy:  Group Therapy   Participation Level:  Did Not Attend - was invited individually by Nurse/MHT and chose not to attend.   Susa Simmonds, LCSWA 07/02/2020  1:23 PM

## 2020-07-02 NOTE — Progress Notes (Signed)
[facility] MD Progress Note  07/02/2020 12:49 PM Alyssa Mathis  MRN:  [id number] Subjective: Follow-up for this 34 year old woman with psychotic symptoms.  Patient seen chart reviewed.  According to the chart the patient is still agitated at night.  Getting up at night agitating staff appears disorganized and paranoid.  This morning when I saw her in her room she was much calmer.  She denied hallucinations and denied any signs of paranoia.  Patient's only request from me was to increase her food portions.  Blood pressure is getting under better control.  She intermittently refuses her medicines but now has forced med orders in place. Principal Problem: Schizoaffective disorder, bipolar type (HCC) Diagnosis: Principal Problem:   Schizoaffective disorder, bipolar type (HCC) Active Problems:   Poor compliance   Pregnant   Hypertension  Total Time spent with patient: 30 minutes  Past Psychiatric History: Past history of episodes of psychosis.  Past Medical History:  Past Medical History:  Diagnosis Date  . Anemia   . Hypertension   . Pregnancy induced hypertension     Past Surgical History:  Procedure Laterality Date  . NO PAST SURGERIES     Family History:  Family History  Problem Relation Age of Onset  . Hypertension Sister   . Hypertension Maternal Grandmother   . Diabetes Maternal Grandfather   . Hypertension Maternal Grandfather    Family Psychiatric  History: See previous Social History:  Social History   Substance and Sexual Activity  Alcohol Use Never     Social History   Substance and Sexual Activity  Drug Use Not on file    Social History   Socioeconomic History  . Marital status: Single    Spouse name: Not on file  . Number of children: Not on file  . Years of education: Not on file  . Highest education level: Not on file  Occupational History  . Not on file  Tobacco Use  . Smoking status: Never Smoker  . Smokeless tobacco: Never Used  Substance and Sexual  Activity  . Alcohol use: Never  . Drug use: Not on file  . Sexual activity: Yes  Other Topics Concern  . Not on file  Social History Narrative  . Not on file   Social Determinants of Health   Financial Resource Strain:   . Difficulty of Paying Living Expenses: Not on file  Food Insecurity:   . Worried About Programme researcher, broadcasting/film/video in the Last Year: Not on file  . Ran Out of Food in the Last Year: Not on file  Transportation Needs:   . Lack of Transportation (Medical): Not on file  . Lack of Transportation (Non-Medical): Not on file  Physical Activity:   . Days of Exercise per Week: Not on file  . Minutes of Exercise per Session: Not on file  Stress:   . Feeling of Stress : Not on file  Social Connections:   . Frequency of Communication with Friends and Family: Not on file  . Frequency of Social Gatherings with Friends and Family: Not on file  . Attends Religious Services: Not on file  . Active Member of Clubs or Organizations: Not on file  . Attends Banker Meetings: Not on file  . Marital Status: Not on file   Additional Social History:                         Sleep: Fair  Appetite:  Fair  Current Medications:  Current Facility-Administered Medications  Medication Dose Route Frequency Provider Last Rate Last Admin  . acetaminophen (TYLENOL) tablet 650 mg  650 mg Oral Q6H PRN Massiah Longanecker T, MD      . alum & mag hydroxide-simeth (MAALOX/MYLANTA) 200-200-20 MG/5ML suspension 30 mL  30 mL Oral Q4H PRN Jerlyn Pain T, MD      . amLODipine (NORVASC) tablet 5 mg  5 mg Oral Q breakfast Ingrid Shifrin, [name], MD   5 mg at 07/02/20 0834  . amoxicillin (AMOXIL) capsule 500 mg  500 mg Oral TID Brooke Payes, [name], MD   500 mg at 07/02/20 0834  . aspirin chewable tablet 81 mg  81 mg Oral Daily Beyla Loney, [name], MD   81 mg at 07/02/20 0834  . feeding supplement (ENSURE ENLIVE) (ENSURE ENLIVE) liquid 237 mL  237 mL Oral TID BM [name], MD   237 mL at 07/02/20 0942   . ferrous sulfate tablet 325 mg  325 mg Oral Q breakfast [name], MD   325 mg at 07/02/20 0834  . labetalol (NORMODYNE) tablet 200 mg  200 mg Oral BID [name], MD   200 mg at 07/02/20 0834  . magnesium hydroxide (MILK OF MAGNESIA) suspension 30 mL  30 mL Oral Daily PRN Alvis Edgell T, MD      . metoCLOPramide (REGLAN) tablet 10 mg  10 mg Oral Q8H PRN Tameeka Luo T, MD      . OLANZapine (ZYPREXA) tablet 5 mg  5 mg Oral BID [name], MD   5 mg at 07/02/20 5883   Or  . OLANZapine (ZYPREXA) injection 5 mg  5 mg Intramuscular BID [name], MD      . OLANZapine [facility]) tablet 2.5 mg  2.5 mg Oral Q6H PRN [name], MD      . prenatal vitamin w/FE, FA (PRENATAL 1 + 1) 27-1 MG tablet 1 tablet  1 tablet Oral q AM [name], MD   1 tablet at 07/02/20 2549    Lab Results:  Results for orders placed or performed during the hospital encounter of 06/28/20 (from the past 48 hour(s))  Protein / creatinine ratio, urine     Status: None   Collection Time: 06/30/20  5:17 PM  Result Value Ref Range   Creatinine, Urine 310 mg/dL   Total Protein, Urine 26 mg/dL    Comment: NO NORMAL RANGE ESTABLISHED FOR THIS TEST   Protein Creatinine Ratio 0.08 0.00 - 0.15 mg/mg[Cre]    Comment: Performed at [facility], [address] [place], [place] [address]  Chlamydia/NGC rt PCR [facility] only)     Status: None   Collection Time: 07/01/20 10:55 AM   Specimen: Urine  Result Value Ref Range   Specimen source GC/Chlam URINE, RANDOM    Chlamydia Tr NOT DETECTED NOT DETECTED   N gonorrhoeae NOT DETECTED NOT DETECTED    Comment: (NOTE) This CT/NG assay has not been evaluated in patients with a history of  hysterectomy. Performed at [facility], [address] [place], [place] [address]   RPR     Status: None   Collection Time: 07/01/20  5:38 PM  Result Value Ref Range   RPR Ser Ql NON REACTIVE NON REACTIVE    Comment: Performed at [facility], [address], [place], [place] [address]  HIV Antibody (routine testing w rflx)     Status: None   Collection Time: 07/01/20  5:38 PM  Result Value Ref Range   HIV Screen 4th Generation wRfx Non Reactive Non Reactive    Comment: Performed at [facility], [address] [place], [place] [address]    Blood Alcohol level:  Lab Results  Component Value Date   ETH <10 06/27/2020    Metabolic Disorder Labs: Lab Results  Component Value Date   HGBA1C 4.8 06/27/2020   MPG 91 06/27/2020   No results found for: PROLACTIN Lab Results  Component Value Date   CHOL 182 06/27/2020   TRIG 61 06/27/2020   HDL 88 06/27/2020   CHOLHDL 2.1 06/27/2020   VLDL 12 06/27/2020   LDLCALC 82 06/27/2020    Physical Findings: AIMS: Facial and Oral Movements Muscles of Facial Expression: None, normal Lips and Perioral Area: None, normal Jaw: None, normal Tongue: None, normal,Extremity Movements Upper (arms, wrists, hands, fingers): None, normal Lower (legs, knees, ankles, toes): None, normal, Trunk Movements Neck, shoulders, hips: None, normal, Overall Severity Severity of abnormal movements (highest score from questions above): None, normal Incapacitation due to abnormal movements: None, normal Patient's awareness of abnormal movements (rate only patient's report): No Awareness, Dental Status Current problems with teeth and/or dentures?: No Does patient usually wear dentures?: No  CIWA:    COWS:     Musculoskeletal: Strength & Muscle Tone: within normal limits Gait & Station: normal Patient leans: N/A  Psychiatric Specialty Exam: Physical Exam Vitals and nursing note reviewed.  Constitutional:      Appearance: She is well-developed.  HENT:     Head: Normocephalic and atraumatic.  Eyes:     Conjunctiva/sclera: Conjunctivae normal.     Pupils: Pupils are equal, round, and reactive to light.  Cardiovascular:     Heart sounds: Normal heart sounds.  Pulmonary:      Effort: Pulmonary effort is normal.  Abdominal:     Palpations: Abdomen is soft.  Musculoskeletal:        General: Normal range of motion.     Cervical back: Normal range of motion.  Skin:    General: Skin is warm and dry.  Neurological:     General: No focal deficit present.     Mental Status: She is alert.  Psychiatric:        Attention and Perception: She is inattentive.        Mood and Affect: Mood normal.        Speech: Speech is delayed.        Behavior: Behavior is withdrawn.        Thought Content: Thought content does not include homicidal or suicidal ideation.        Cognition and Memory: Cognition normal.        Judgment: Judgment is impulsive.     Review of Systems  Constitutional: Negative.   HENT: Negative.   Eyes: Negative.   Respiratory: Negative.   Cardiovascular: Negative.   Gastrointestinal: Negative.   Musculoskeletal: Negative.   Skin: Negative.   Neurological: Negative.   Psychiatric/Behavioral: Positive for behavioral problems. Negative for suicidal ideas. The patient is nervous/anxious.     Blood pressure 128/78, pulse 65, temperature 99.1 F (37.3 C), temperature source Oral, resp. rate 17, height 5\' 6"  (1.676 m), weight 56.7 kg, SpO2 99 %, unknown if currently breastfeeding.Body mass index is 20.18 kg/m.  General Appearance: Casual  Eye Contact:  Fair  Speech:  Slow  Volume:  Decreased  Mood:  Euthymic  Affect:  Constricted  Thought Process:  Coherent  Orientation:  Full (Time, Place, and Person)  Thought Content:  Paranoid Ideation  Suicidal Thoughts:  No  Homicidal Thoughts:  No  Memory:  Immediate;   Fair Recent;   Poor Remote;   Poor  Judgement:  Impaired  Insight:  Shallow  Psychomotor Activity:  Decreased  Concentration:  Concentration: Poor  Recall:  Poor  Fund of Knowledge:  Fair  Language:  Fair  Akathisia:  No  Handed:  Right  AIMS (if indicated):     Assets:  Desire for Improvement Housing Physical Health  ADL's:   Impaired  Cognition:  Impaired,  Mild  Sleep:  Number of Hours: 8     Treatment Plan Summary: Daily contact with patient to assess and evaluate symptoms and progress in treatment, Medication management and Plan Possibly slightly improved with olanzapine but also may just be behaving a little better this morning when I saw her.  Encourage patient to continue to take medicine.  No change to medication orders.  I am fine with her having increased food portions and have put in orders for that.  Mordecai RasmussenJohn Makani Seckman, MD 07/02/2020, 12:49 PM

## 2020-07-02 NOTE — Progress Notes (Signed)
D: Pt alert and oriented. Pt rates depression 0/10, hopelessness 0/10, and anxiety 0/10. Pt goal: "My appetite and to stay encouraged." Pt reports energy level as high and concentration as being good. Pt reports sleep last night as being good. Pt did not receive medications for sleep. Pt denies experiencing any pain at this time. Pt denies experiencing any SI/HI, or AVH at this time. Pt is still very intrusive, suspicious, watchful, and paranoid. Pt believed her birthday was listed up on the board in the hallway this evening and wanted to know why. Pt did not believe staff even when told that was staffing's birthdays not pt's.   A: Scheduled medications administered to pt, per MD orders. Support and encouragement provided. Frequent verbal contact made. Routine safety checks conducted q15 minutes.   R: No adverse drug reactions noted. Pt verbally contracts for safety at this time. Pt complaint with medications. Pt interacts well with others on the unit. Pt remains safe at this time. Will continue to monitor.

## 2020-07-03 NOTE — Plan of Care (Signed)
D- Patient alert and oriented. Patient presented in an agitated/angry mood on assessment. Patient is extremely demanding and religiously preoccupied. It was reported that patient refused her morning medication because she did not like her nurse, however, she stated to this writer that "that woman is a Sales promotion account executive and she's lying on my name. She is not of God". 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 Patient denied any signs/symptoms of depression and anxiety, stating "that's a demonic spirit". Patient also denied SI, HI, AVH, and pain, stating "that's a demonic spirit". Patient had no stated goals for today.  A- Scheduled medications administered to patient, per MD orders. Support and encouragement provided.  Routine safety checks conducted every 15 minutes.  Patient informed to notify staff with problems or concerns.  R- No adverse drug reactions noted. Patient contracts for safety at this time. Patient compliant with medications and treatment plan. Patient interacts well with others on the unit.  Patient remains safe at this time.  Problem: Education: Goal: Knowledge of [facility] [place] Education information/materials will improve Outcome: Not Progressing Goal: Emotional status will improve Outcome: Not Progressing Goal: Mental status will improve Outcome: Not Progressing Goal: Verbalization of understanding the information provided will improve Outcome: Not Progressing   Problem: Activity: Goal: Interest or engagement in activities will improve Outcome: Not Progressing Goal: Sleeping patterns will improve Outcome: Not Progressing   Problem: Coping: Goal: Ability to verbalize frustrations and anger appropriately will improve Outcome: Not Progressing Goal: Ability to demonstrate self-control will improve Outcome: Not Progressing    Problem: Health Behavior/Discharge Planning: Goal: Identification of resources available to assist in meeting health care needs will improve Outcome: Not Progressing Goal: Compliance with treatment plan for underlying cause of condition will improve Outcome: Not Progressing   Problem: Physical Regulation: Goal: Ability to maintain clinical measurements within normal limits will improve Outcome: Not Progressing   Problem: Safety: Goal: Periods of time without injury will increase Outcome: Not Progressing   Problem: Activity: Goal: Will verbalize the importance of balancing activity with adequate rest periods Outcome: Not Progressing   Problem: Education: Goal: Will be free of psychotic symptoms Outcome: Not Progressing Goal: Knowledge of the prescribed therapeutic regimen will improve Outcome: Not Progressing   Problem: Coping: Goal: Coping ability will improve Outcome: Not Progressing Goal: Will verbalize feelings Outcome: Not Progressing   Problem: Health Behavior/Discharge Planning: Goal: Compliance with prescribed medication regimen will improve Outcome: Not Progressing   Problem: Nutritional: Goal: Ability to achieve adequate nutritional intake will improve Outcome: Not Progressing   Problem: Role Relationship: Goal: Ability to communicate needs accurately will improve Outcome: Not Progressing Goal: Ability to interact with others will improve Outcome: Not Progressing   Problem: Safety: Goal: Ability to redirect hostility and anger into socially appropriate behaviors will improve Outcome: Not Progressing Goal: Ability to remain free from injury will improve Outcome: Not Progressing   Problem: Self-Care: Goal: Ability to participate in self-care as condition permits will improve Outcome: Not Progressing   Problem: Self-Concept: Goal: Will verbalize positive feelings about self Outcome: Not Progressing

## 2020-07-03 NOTE — Progress Notes (Signed)
Patient was asked multiple times to pick up her tray out of the dayroom, and she stated that she's not going to pick it up because she doesn't want to.

## 2020-07-03 NOTE — Progress Notes (Signed)
Patient is upset that staff did not wake her up right at 10 o'clock to give her her Ensure. This Clinical research associate tried to explain to patient the we don't normally wake patients up to give out nutritional supplements. This Clinical research associate was informed by Marsa Aris, MHT, that patient was in the dayroom upset with her because she didn't get her Ensure on time. This Clinical research associate informed patient that in the future staff will wake her up when her Ensure comes.

## 2020-07-03 NOTE — Progress Notes (Signed)
This writer went to go get patient for her afternoon antibiotic and patient stated "I thought it was for seven". This Clinical research associate informed patient that her medication is scheduled at three, per my work list, and patient stated "well, let me check my work list". Patient then comes to the nurses station about three minutes later and states "I'll take it".

## 2020-07-03 NOTE — Progress Notes (Signed)
Hearing voices telling her staff are liars and deceitful spirits. Insists on having only caucasian staff care for her needs. Very needy and attention seeking. Demanding at times. Is very rude and verbally hostile toward IT sales professional.

## 2020-07-03 NOTE — Progress Notes (Signed)
Patient came up to the nurse's station reporting that the fries that came onto her dinner tray were too hard and that she could not eat them. This Clinical research associate called down to dining services and requested that another order be sent up. Patient just came back to the nurse's station stating the same complaint to the second nurse on the shift. Staff tried to explain to patient that we do not make the food and this was the best that we could do. Patient brought the fries to the nurse's station asking staff "does this look edible to you, they're burnt". Staff stated to patient that the fries looked fine, and that they are baked in the oven, however, this was not good enough for patient, so she stormed off back down to the dayroom.

## 2020-07-03 NOTE — Progress Notes (Signed)
[facility] MD Progress Note  07/03/2020 1:10 PM Alyssa Mathis  MRN:  [id number] Subjective: Follow-up for this patient with schizoaffective disorder.  Nursing staff reports that the patient is frequently rude and verbally hostile towards nursing staff.  She was angry today that nursing did not wake her up when her Ensure supplement came.  I explained to the patient that the Ensure is not thrown away but kept in the refrigerator and she can have it any time and that I agreed with the nursing decision about when to wake her up.  Patient is not making any outward psychotic statements today but still seems irritable. Principal Problem: Schizoaffective disorder, bipolar type (HCC) Diagnosis: Principal Problem:   Schizoaffective disorder, bipolar type (HCC) Active Problems:   Poor compliance   Pregnant   Hypertension  Total Time spent with patient: 30 minutes  Past Psychiatric History: Past history of psychotic disorder  Past Medical History:  Past Medical History:  Diagnosis Date  . Anemia   . Hypertension   . Pregnancy induced hypertension     Past Surgical History:  Procedure Laterality Date  . NO PAST SURGERIES     Family History:  Family History  Problem Relation Age of Onset  . Hypertension Sister   . Hypertension Maternal Grandmother   . Diabetes Maternal Grandfather   . Hypertension Maternal Grandfather    Family Psychiatric  History: See previous Social History:  Social History   Substance and Sexual Activity  Alcohol Use Never     Social History   Substance and Sexual Activity  Drug Use Not on file    Social History   Socioeconomic History  . Marital status: Single    Spouse name: Not on file  . Number of children: Not on file  . Years of education: Not on file  . Highest education level: Not on file  Occupational History  . Not on file  Tobacco Use  . Smoking status: Never Smoker  . Smokeless tobacco: Never Used  Substance and Sexual Activity  . Alcohol  use: Never  . Drug use: Not on file  . Sexual activity: Yes  Other Topics Concern  . Not on file  Social History Narrative  . Not on file   Social Determinants of Health   Financial Resource Strain:   . Difficulty of Paying Living Expenses: Not on file  Food Insecurity:   . Worried About Programme researcher, broadcasting/film/video in the Last Year: Not on file  . Ran Out of Food in the Last Year: Not on file  Transportation Needs:   . Lack of Transportation (Medical): Not on file  . Lack of Transportation (Non-Medical): Not on file  Physical Activity:   . Days of Exercise per Week: Not on file  . Minutes of Exercise per Session: Not on file  Stress:   . Feeling of Stress : Not on file  Social Connections:   . Frequency of Communication with Friends and Family: Not on file  . Frequency of Social Gatherings with Friends and Family: Not on file  . Attends Religious Services: Not on file  . Active Member of Clubs or Organizations: Not on file  . Attends Banker Meetings: Not on file  . Marital Status: Not on file   Additional Social History:                         Sleep: Fair  Appetite:  Fair  Current Medications: Current Facility-Administered  Medications  Medication Dose Route Frequency Provider Last Rate Last Admin  . acetaminophen (TYLENOL) tablet 650 mg  650 mg Oral Q6H PRN Coda Filler T, MD      . alum & mag hydroxide-simeth (MAALOX/MYLANTA) 200-200-20 MG/5ML suspension 30 mL  30 mL Oral Q4H PRN Marney Treloar T, MD      . amLODipine (NORVASC) tablet 5 mg  5 mg Oral Q breakfast Ameris Akamine T, MD   5 mg at 07/03/20 0820  . amoxicillin (AMOXIL) capsule 500 mg  500 mg Oral TID Emmit Oriley, [name], MD   500 mg at 07/03/20 0820  . aspirin chewable tablet 81 mg  81 mg Oral Daily Amiir Heckard, [name], MD   81 mg at 07/03/20 0824  . feeding supplement (ENSURE ENLIVE) (ENSURE ENLIVE) liquid 237 mL  237 mL Oral TID BM [name], MD   237 mL at 07/03/20 1100  . ferrous sulfate  tablet 325 mg  325 mg Oral Q breakfast [name], MD   325 mg at 07/03/20 0820  . labetalol (NORMODYNE) tablet 200 mg  200 mg Oral BID [name], MD   200 mg at 07/03/20 0820  . magnesium hydroxide (MILK OF MAGNESIA) suspension 30 mL  30 mL Oral Daily PRN Davian Hanshaw T, MD      . metoCLOPramide (REGLAN) tablet 10 mg  10 mg Oral Q8H PRN Jahmeer Porche T, MD      . OLANZapine (ZYPREXA) tablet 5 mg  5 mg Oral BID [name], MD   5 mg at 07/03/20 0820   Or  . OLANZapine (ZYPREXA) injection 5 mg  5 mg Intramuscular BID [name], MD      . OLANZapine [facility]) tablet 2.5 mg  2.5 mg Oral Q6H PRN [name], MD      . prenatal vitamin w/FE, FA (PRENATAL 1 + 1) 27-1 MG tablet 1 tablet  1 tablet Oral q AM [name], MD   1 tablet at 07/03/20 0820    Lab Results:  Results for orders placed or performed during the hospital encounter of 06/28/20 (from the past 48 hour(s))  RPR     Status: None   Collection Time: 07/01/20  5:38 PM  Result Value Ref Range   RPR Ser Ql NON REACTIVE NON REACTIVE    Comment: Performed at [facility], [address], [place], [place] [address]  HIV Antibody (routine testing w rflx)     Status: None   Collection Time: 07/01/20  5:38 PM  Result Value Ref Range   HIV Screen 4th Generation wRfx Non Reactive Non Reactive    Comment: Performed at [facility], [address], [place], [place] [address]    Blood Alcohol level:  Lab Results  Component Value Date   ETH <10 06/27/2020    Metabolic Disorder Labs: Lab Results  Component Value Date   HGBA1C 4.8 06/27/2020   MPG 91 06/27/2020   No results found for: PROLACTIN Lab Results  Component Value Date   CHOL 182 06/27/2020   TRIG 61 06/27/2020   HDL 88 06/27/2020   CHOLHDL 2.1 06/27/2020   VLDL 12 06/27/2020   LDLCALC 82 06/27/2020    Physical Findings: AIMS: Facial and Oral Movements Muscles of Facial Expression: None, normal Lips and Perioral Area:  None, normal Jaw: None, normal Tongue: None, normal,Extremity Movements Upper (arms, wrists, hands, fingers): None, normal Lower (legs, knees, ankles, toes): None, normal, Trunk  Movements Neck, shoulders, hips: None, normal, Overall Severity Severity of abnormal movements (highest score from questions above): None, normal Incapacitation due to abnormal movements: None, normal Patient's awareness of abnormal movements (rate only patient's report): No Awareness, Dental Status Current problems with teeth and/or dentures?: No Does patient usually wear dentures?: No  CIWA:    COWS:     Musculoskeletal: Strength & Muscle Tone: within normal limits Gait & Station: normal Patient leans: N/A  Psychiatric Specialty Exam: Physical Exam Vitals and nursing note reviewed.  Constitutional:      Appearance: She is well-developed.  HENT:     Head: Normocephalic and atraumatic.  Eyes:     Conjunctiva/sclera: Conjunctivae normal.     Pupils: Pupils are equal, round, and reactive to light.  Cardiovascular:     Heart sounds: Normal heart sounds.  Pulmonary:     Effort: Pulmonary effort is normal.  Abdominal:     Palpations: Abdomen is soft.    Musculoskeletal:        General: Normal range of motion.     Cervical back: Normal range of motion.  Skin:    General: Skin is warm and dry.  Neurological:     Mental Status: She is alert.  Psychiatric:        Attention and Perception: Attention normal.        Mood and Affect: Mood is anxious. Affect is labile.        Speech: Speech normal.        Behavior: Behavior is agitated. Behavior is not aggressive.        Thought Content: Thought content is paranoid. Thought content does not include homicidal or suicidal ideation.        Cognition and Memory: Cognition normal.        Judgment: Judgment normal.     Review of Systems  Constitutional: Negative.   HENT: Negative.   Eyes: Negative.   Respiratory: Negative.   Cardiovascular: Negative.     Gastrointestinal: Negative.   Musculoskeletal: Negative.   Skin: Negative.   Neurological: Negative.   Psychiatric/Behavioral: Positive for dysphoric mood. The patient is nervous/anxious.     Blood pressure 112/78, pulse 68, temperature 98.7 F (37.1 C), temperature source Oral, resp. rate 17, height 5\' 6"  (1.676 m), weight 56.7 kg, SpO2 100 %, unknown if currently breastfeeding.Body mass index is 20.18 kg/m.  General Appearance: Casual  Eye Contact:  Fair  Speech:  Slow  Volume:  Decreased  Mood:  Euthymic  Affect:  Constricted  Thought Process:  Goal Directed  Orientation:  Full (Time, Place, and Person)  Thought Content:  Rumination  Suicidal Thoughts:  No  Homicidal Thoughts:  No  Memory:  Immediate;   Fair Recent;   Fair Remote;   Fair  Judgement:  Impaired  Insight:  Shallow  Psychomotor Activity:  Decreased  Concentration:  Concentration: Fair  Recall:  FiservFair  Fund of Knowledge:  Fair  Language:  Fair  Akathisia:  No  Handed:  Right  AIMS (if indicated):     Assets:  Desire for Improvement Resilience  ADL's:  Intact  Cognition:  Impaired,  Mild  Sleep:  Number of Hours: 8     Treatment Plan Summary: Daily contact with patient to assess and evaluate symptoms and progress in treatment, Medication management and Plan Pregnant patient with psychosis.  Still irritable but not as obviously psychotic.  Seems to be taking better care of her self.  Still cannot formulate a good plan for treatment  after hospitalization.  No change to current medication.  Continue to encourage patient to take olanzapine..  Case reviewed with nursing.  Mordecai Rasmussen, MD 07/03/2020, 1:10 PM

## 2020-07-03 NOTE — Progress Notes (Signed)
Patient stated that she wants a soda, "I want one now, you shouldn't do pregnant people like this". This Clinical research associate asked patient if she received one during dinner and she stated "no I did not, I wouldn't lie, I put that on my kids". This Clinical research associate confirmed with staff that patient did not receive a soda, so she was given a Sprite.

## 2020-07-03 NOTE — Plan of Care (Signed)
  Problem: Education: Goal: Knowledge of [place] Education information/materials will improve Outcome: Progressing Goal: Emotional status will improve Outcome: Progressing Goal: Mental status will improve Outcome: Progressing Goal: Verbalization of understanding the information provided will improve Outcome: Progressing   

## 2020-07-03 NOTE — [facility] Group Notes (Signed)
LCSW Aftercare Discharge Planning Group Note   07/03/2020 1:20-2:15 PM   Type of Group and Topic: Psychoeducational Group:  Discharge Planning  Participation Level:  Minimal  Description of Group  Discharge planning group reviews patient's anticipated discharge plans and assists patients to anticipate and address any barriers to wellness/recovery in the community.  Suicide prevention education is reviewed with patients in group.  Therapeutic Goals 1. Patients will state their anticipated discharge plan and mental health aftercare 2. Patients will identify potential barriers to wellness in the community setting 3. Patients will engage in problem solving, solution focused discussion of ways to anticipate and address barriers to wellness/recovery  Summary of Patient Progress; CSW asked patient what her goals are and what her plan is when she gets out of the hospital. Patient looked at CSW and stated there are things you just don't tell people. Patient stated she had a plan but did not want to tell CSW. Patient then stated well I guess I would have to tell you my plan if I want to get out of here. Patient asked if a judge put her in the hospital how long would she have to be here. Patient was not open to discuss anything. Patient was upset about not having hair products.        Susa Simmonds, LCSWA 07/03/2020 2:47 PM

## 2020-07-04 NOTE — Progress Notes (Signed)
Patient has been needy and attention seeking. Rude and confrontational with her peers. Got into an argument over a nutri grain bar with a peer. The patient provoked the peer and the peer apologized for losing her temper and agreed to stay away from the patient. The patient was not remorseful over the altercation, saying "If I wasn't pregnant, I'd mess her up". Continues to avoid IT sales professional and demanding that caucasian staff be the only one to do things for her. Denies SI and HI

## 2020-07-04 NOTE — Plan of Care (Signed)
Patient is demanding at times with staff about her meal tray states " this is not the way to treat pregnant woman." Patient is redirectable most of the time.Patient states her goal for today is " work on not being so loud when I talk or sing at times." Patient had a nap after lunch.Compliant with medications.Attended groups.Appetite and energy level good.Support and encouragement given.

## 2020-07-04 NOTE — Progress Notes (Signed)
[facility] MD Progress Note  07/04/2020 3:11 PM Alyssa Mathis  MRN:  [id number]   Subjective:  Alyssa Mathis seen one-on-one in hallway this morning. She is fixated on not getting double portions of her sides, and only on her entrees. She feels she is not getting adequate calories for her and baby. Reassured that she is getting double entrees plus three ensure shakes a day which should provide adequate calories and nutrition for both her and her unborn child. She denies any suicidal ideations, homicidal ideations, visual hallucinations, or auditory hallucinations. She is not longer frankly psychotic or manic on exam. She has been agreeable to taking Olanzapine orally, and has not required non-emergent forced medication. Her level of confrontation with staff and peers suggest she is still paranoid that both staff and peers are trying to harm her and her fetus. She does not appear well enough to function outside of the hospital.   Principal Problem: Schizoaffective disorder, bipolar type (HCC) Diagnosis: Principal Problem:   Schizoaffective disorder, bipolar type (HCC) Active Problems:   Poor compliance   Pregnant   Hypertension  Total Time spent with patient: 30 minutes  Past Psychiatric History: History of schizoaffective disorder, bipolar type. She was hospitalized most recently in May 2021, and stabalized on Risperdal 4 mg QHS. She has failed monotherapy with depakote, trilleptal, and Abilify in the past. She had a dystonic reaction to [place]. She was scheduled to follow-up with [facility] outpatient, but did not attend appointments. She was also hospitalized at [facility] December of 2020 for 21 days  Past Medical History:  Past Medical History:  Diagnosis Date  . Anemia   . Hypertension   . Pregnancy induced hypertension     Past Surgical History:  Procedure Laterality Date  . NO PAST SURGERIES     Family History:  Family History  Problem Relation Age of Onset  . Hypertension Sister   .  Hypertension Maternal Grandmother   . Diabetes Maternal Grandfather   . Hypertension Maternal Grandfather    Family Psychiatric  History: Denies Social History:  Social History   Substance and Sexual Activity  Alcohol Use Never     Social History   Substance and Sexual Activity  Drug Use Not on file    Social History   Socioeconomic History  . Marital status: Single    Spouse name: Not on file  . Number of children: Not on file  . Years of education: Not on file  . Highest education level: Not on file  Occupational History  . Not on file  Tobacco Use  . Smoking status: Never Smoker  . Smokeless tobacco: Never Used  Substance and Sexual Activity  . Alcohol use: Never  . Drug use: Not on file  . Sexual activity: Yes  Other Topics Concern  . Not on file  Social History Narrative  . Not on file   Social Determinants of Health   Financial Resource Strain:   . Difficulty of Paying Living Expenses: Not on file  Food Insecurity:   . Worried About Programme researcher, broadcasting/film/video in the Last Year: Not on file  . Ran Out of Food in the Last Year: Not on file  Transportation Needs:   . Lack of Transportation (Medical): Not on file  . Lack of Transportation (Non-Medical): Not on file  Physical Activity:   . Days of Exercise per Week: Not on file  . Minutes of Exercise per Session: Not on file  Stress:   . Feeling of  Stress : Not on file  Social Connections:   . Frequency of Communication with Friends and Family: Not on file  . Frequency of Social Gatherings with Friends and Family: Not on file  . Attends Religious Services: Not on file  . Active Member of Clubs or Organizations: Not on file  . Attends BankerClub or Organization Meetings: Not on file  . Marital Status: Not on file   Additional Social History:                         Sleep: Fair  Appetite:  Good  Current Medications: Current Facility-Administered Medications  Medication Dose Route Frequency Provider  Last Rate Last Admin  . acetaminophen (TYLENOL) tablet 650 mg  650 mg Oral Q6H PRN [name], [name], MD      . alum & mag hydroxide-simeth (MAALOX/MYLANTA) 200-200-20 MG/5ML suspension 30 mL  30 mL Oral Q4H PRN [name], Jackquline DenmarkJohn T, MD   30 mL at 07/04/20 0644  . amLODipine (NORVASC) tablet 5 mg  5 mg Oral Q breakfast [name], Jackquline DenmarkJohn T, MD   5 mg at 07/04/20 0818  . amoxicillin (AMOXIL) capsule 500 mg  500 mg Oral TID [name], Jackquline DenmarkJohn T, MD   500 mg at 07/04/20 [id number]  . aspirin chewable tablet 81 mg  81 mg Oral Daily [name], Jackquline DenmarkJohn T, MD   81 mg at 07/04/20 0819  . feeding supplement (ENSURE ENLIVE) (ENSURE ENLIVE) liquid 237 mL  237 mL Oral TID BM Jesse SansFreeman, Mikisha Roseland M, MD   237 mL at 07/04/20 1328  . ferrous sulfate tablet 325 mg  325 mg Oral Q breakfast Jesse SansFreeman, Bayard More M, MD   325 mg at 07/04/20 0818  . labetalol (NORMODYNE) tablet 200 mg  200 mg Oral BID Jesse SansFreeman, Tanyiah Laurich M, MD   200 mg at 07/04/20 0818  . magnesium hydroxide (MILK OF MAGNESIA) suspension 30 mL  30 mL Oral Daily PRN [name], [name], MD      . metoCLOPramide (REGLAN) tablet 10 mg  10 mg Oral Q8H PRN [name], [name], MD      . OLANZapine (ZYPREXA) tablet 5 mg  5 mg Oral BID Jesse SansFreeman, Justiss Gerbino M, MD   5 mg at 07/04/20 0818   Or  . OLANZapine (ZYPREXA) injection 5 mg  5 mg Intramuscular BID Jesse SansFreeman, Dezire Turk M, MD      . OLANZapine [facility](ZYPREXA) tablet 2.5 mg  2.5 mg Oral Q6H PRN Jesse SansFreeman, Ramia Sidney M, MD      . prenatal vitamin w/FE, FA (PRENATAL 1 + 1) 27-1 MG tablet 1 tablet  1 tablet Oral q AM Jesse SansFreeman, Joselin Crandell M, MD   1 tablet at 07/04/20 [id number]    Lab Results: No results found for this or any previous visit (from the past 48 hour(s)).  Blood Alcohol level:  Lab Results  Component Value Date   ETH <10 06/27/2020    Metabolic Disorder Labs: Lab Results  Component Value Date   HGBA1C 4.8 06/27/2020   MPG 91 06/27/2020   No results found for: PROLACTIN Lab Results  Component Value Date   CHOL 182 06/27/2020   TRIG 61 06/27/2020   HDL 88 06/27/2020    CHOLHDL 2.1 06/27/2020   VLDL 12 06/27/2020   LDLCALC 82 06/27/2020    Physical Findings: AIMS: Facial and Oral Movements Muscles of Facial Expression: None, normal Lips and Perioral Area: None, normal Jaw: None, normal Tongue: None, normal,Extremity Movements Upper (arms, wrists, hands, fingers): None, normal Lower (legs,  knees, ankles, toes): None, normal, Trunk Movements Neck, shoulders, hips: None, normal, Overall Severity Severity of abnormal movements (highest score from questions above): None, normal Incapacitation due to abnormal movements: None, normal Patient's awareness of abnormal movements (rate only patient's report): No Awareness, Dental Status Current problems with teeth and/or dentures?: No Does patient usually wear dentures?: No  CIWA:    COWS:     Musculoskeletal: Strength & Muscle Tone: within normal limits Gait & Station: normal Patient leans: N/A  Psychiatric Specialty Exam: Physical Exam Vitals and nursing note reviewed.  Constitutional:      Appearance: Normal appearance.  HENT:     Head: Normocephalic and atraumatic.     Right Ear: External ear normal.     Left Ear: External ear normal.     Nose: Nose normal.     Mouth/Throat:     Mouth: Mucous membranes are moist.     Pharynx: Oropharynx is clear.  Eyes:     Extraocular Movements: Extraocular movements intact.     Conjunctiva/sclera: Conjunctivae normal.     Pupils: Pupils are equal, round, and reactive to light.  Cardiovascular:     Rate and Rhythm: Normal rate.     Pulses: Normal pulses.  Pulmonary:     Effort: Pulmonary effort is normal.     Breath sounds: Normal breath sounds.  Abdominal:     Tenderness: There is no abdominal tenderness. There is no guarding.  Musculoskeletal:        General: No swelling. Normal range of motion.     Cervical back: Normal range of motion. No rigidity.  Skin:    General: Skin is warm and dry.  Neurological:     General: No focal deficit present.      Mental Status: She is alert and oriented to person, place, and time.  Psychiatric:        Mood and Affect: Affect is angry.        Behavior: Behavior is agitated.        Judgment: Judgment is impulsive.     Review of Systems  Constitutional: Positive for appetite change. Negative for fatigue.  HENT: Negative for rhinorrhea and sore throat.   Eyes: Negative for photophobia and visual disturbance.  Respiratory: Negative for cough and shortness of breath.   Cardiovascular: Negative for chest pain and palpitations.  Gastrointestinal: Negative for constipation, diarrhea, nausea and vomiting.  Endocrine: Negative for cold intolerance and heat intolerance.  Genitourinary: Negative for difficulty urinating and dysuria.  Musculoskeletal: Negative for back pain and neck pain.  Skin: Negative for rash and wound.  Allergic/Immunologic: Negative for food allergies and immunocompromised state.  Neurological: Negative for dizziness and headaches.  Hematological: Negative for adenopathy. Does not bruise/bleed easily.  Psychiatric/Behavioral: Positive for agitation and behavioral problems. Negative for hallucinations and suicidal ideas.    Blood pressure 135/82, pulse 77, temperature 98.2 F (36.8 C), temperature source Oral, resp. rate 18, height 5\' 6"  (1.676 m), weight 56.7 kg, SpO2 100 %, unknown if currently breastfeeding.Body mass index is 20.18 kg/m.  General Appearance: Casual  Eye Contact:  Good  Speech:  Normal Rate  Volume:  Increased  Mood:  Angry and Irritable  Affect:  Congruent  Thought Process:  Coherent  Orientation:  Full (Time, Place, and Person)  Thought Content:  Paranoid Ideation and Rumination  Suicidal Thoughts:  No  Homicidal Thoughts:  No  Memory:  Immediate;   Fair Recent;   Fair Remote;   Fair  Judgement:  Intact  Insight:  Lacking  Psychomotor Activity:  Normal  Concentration:  Concentration: Fair and Attention Span: Fair  Recall:  [employer] of Knowledge:   Good  Language:  Good  Akathisia:  Negative  Handed:  Right  AIMS (if indicated):     Assets:  Communication Skills Desire for Improvement Physical Health Resilience Social Support  ADL's:  Intact  Cognition:  WNL  Sleep:  Number of Hours: 7.75     Treatment Plan Summary: Daily contact with patient to assess and evaluate symptoms and progress in treatment, Medication management and Plan continue current medications. Still cannot formulate a good plan for treatment after hospitalization.   [name], MD 07/04/2020, 3:11 PM

## 2020-07-04 NOTE — Progress Notes (Signed)
Recreation Therapy Notes  Date: 07/04/2020  Time: 9:30 am   Location: Craft room     Behavioral response: N/A   Intervention Topic: Creative expressions    Discussion/Intervention: Patient did not attend group.   Clinical Observations/Feedback:  Patient did not attend group.   Jden Want LRT/CTRS        Rithy Mandley 07/04/2020 12:02 PM 

## 2020-07-04 NOTE — Progress Notes (Signed)
Patient intrusive with staff this evening, continuing to come up to the nurses station asking for anything she can have. Patient had to be educated and she was compliant. Patient compliant with medication administration per MD orders. Patient given education, support, and encouragement to be active in her treatment plan. Patient being monitored Q 15 minutes for safety per unit protocol. Patient remains safe on the unit.

## 2020-07-04 NOTE — [facility] Group Notes (Signed)
LCSW Group Therapy Note   07/04/2020 3:22 PM  Type of Therapy and Topic:  Group Therapy:  Overcoming Obstacles   Participation Level:  None   Description of Group:    In this group patients will be encouraged to explore what they see as obstacles to their own wellness and recovery. They will be guided to discuss their thoughts, feelings, and behaviors related to these obstacles. The group will process together ways to cope with barriers, with attention given to specific choices patients can make. Each patient will be challenged to identify changes they are motivated to make in order to overcome their obstacles. This group will be process-oriented, with patients participating in exploration of their own experiences as well as giving and receiving support and challenge from other group members.   Therapeutic Goals: 1. Patient will identify personal and current obstacles as they relate to admission. 2. Patient will identify barriers that currently interfere with their wellness or overcoming obstacles.  3. Patient will identify feelings, thought process and behaviors related to these barriers. 4. Patient will identify two changes they are willing to make to overcome these obstacles:      Summary of Patient Progress Patient attended group, however, declined to participate. Patient then became argumentative as we discussed anger stating that she has mood swings due to her pregnancy and she should not be held accountable.     Therapeutic Modalities:   Cognitive Behavioral Therapy Solution Focused Therapy Motivational Interviewing Relapse Prevention Therapy  Penni Homans, MSW, LCSW 07/04/2020 3:22 PM

## 2020-07-04 NOTE — Plan of Care (Signed)
  Problem: Education: Goal: Knowledge of [facility] Education information/materials will improve Outcome: Progressing Goal: Emotional status will improve Outcome: Progressing Goal: Mental status will improve Outcome: Progressing Goal: Verbalization of understanding the information provided will improve Outcome: Progressing   Problem: Activity: Goal: Interest or engagement in activities will improve Outcome: Progressing Goal: Sleeping patterns will improve Outcome: Progressing   

## 2020-07-04 NOTE — Plan of Care (Signed)
Patient needy and intrusive with peers and staff   Problem: Education: Goal: Emotional status will improve Outcome: Not Progressing Goal: Mental status will improve Outcome: Not Progressing

## 2020-07-04 NOTE — Tx Team (Signed)
Interdisciplinary Treatment and Diagnostic Plan Update  07/04/2020 Time of Session: 8:30AM Alyssa Mathis MRN: [id number]  Principal Diagnosis: Schizoaffective disorder, bipolar type (HCC)  Secondary Diagnoses: Principal Problem:   Schizoaffective disorder, bipolar type (HCC) Active Problems:   Poor compliance   Pregnant   Hypertension   Current Medications:  Current Facility-Administered Medications  Medication Dose Route Frequency Provider Last Rate Last Admin  . acetaminophen (TYLENOL) tablet 650 mg  650 mg Oral Q6H PRN [name], [name], MD      . alum & mag hydroxide-simeth (MAALOX/MYLANTA) 200-200-20 MG/5ML suspension 30 mL  30 mL Oral Q4H PRN [name], [name], MD   30 mL at 07/04/20 0644  . amLODipine (NORVASC) tablet 5 mg  5 mg Oral Q breakfast [name], [name], MD   5 mg at 07/04/20 0818  . amoxicillin (AMOXIL) capsule 500 mg  500 mg Oral TID [name], [name], MD   500 mg at 07/04/20 2595  . aspirin chewable tablet 81 mg  81 mg Oral Daily [name], [name], MD   81 mg at 07/04/20 0819  . feeding supplement (ENSURE ENLIVE) (ENSURE ENLIVE) liquid 237 mL  237 mL Oral TID BM [name], MD   237 mL at 07/03/20 1934  . ferrous sulfate tablet 325 mg  325 mg Oral Q breakfast [name], MD   325 mg at 07/04/20 0818  . labetalol (NORMODYNE) tablet 200 mg  200 mg Oral BID [name], MD   200 mg at 07/04/20 0818  . magnesium hydroxide (MILK OF MAGNESIA) suspension 30 mL  30 mL Oral Daily PRN [name], [name], MD      . metoCLOPramide (REGLAN) tablet 10 mg  10 mg Oral Q8H PRN [name], [name], MD      . OLANZapine (ZYPREXA) tablet 5 mg  5 mg Oral BID [name], MD   5 mg at 07/04/20 0818   Or  . OLANZapine (ZYPREXA) injection 5 mg  5 mg Intramuscular BID [name], MD      . OLANZapine [facility]) tablet 2.5 mg  2.5 mg Oral Q6H PRN [name], MD      . prenatal vitamin w/FE, FA (PRENATAL 1 + 1) 27-1 MG tablet 1 tablet  1 tablet Oral q AM [name], MD    1 tablet at 07/04/20 6387   PTA Medications: Medications Prior to Admission  Medication Sig Dispense Refill Last Dose  . amLODipine (NORVASC) 5 MG tablet Take 5 mg by mouth daily.     [name] amoxicillin (AMOXIL) 500 MG capsule Take 1 capsule (500 mg total) by mouth 3 (three) times daily. 30 capsule 0   . benztropine (COGENTIN) 1 MG tablet Take 1 mg by mouth 2 (two) times daily.     [name] DICLEGIS 10-10 MG TBEC SMARTSIG:1 By Mouth 4-5 Times Daily     . FEROSUL 325 (65 Fe) MG tablet Take 325 mg by mouth every morning.     . hydrOXYzine (ATARAX/VISTARIL) 25 MG tablet Take 50 mg by mouth every 8 (eight) hours as needed.     . hydrOXYzine (ATARAX/VISTARIL) 25 MG tablet Take 1 tablet (25 mg total) by mouth every 8 (eight) hours as needed for up to 15 doses for anxiety. 12 tablet 0   . labetalol (NORMODYNE) 200 MG tablet Take 200 mg by mouth 2 (two) times daily.     . metoCLOPramide (REGLAN) 10 MG tablet Take 1 tablet (10 mg total) by mouth every  8 (eight) hours as needed for nausea. 30 tablet 0   . Oxcarbazepine (TRILEPTAL) 300 MG tablet Take 300 mg by mouth 2 (two) times daily.     Burnis Medin w/o A-FE-Methfol-FA-DHA (PNV-DHA) 27-0.6-0.4-300 MG CAPS Take 1 capsule by mouth daily.     . promethazine (PHENERGAN) 25 MG tablet Take 25 mg by mouth every 6 (six) hours as needed.     . risperidone (RISPERDAL) 4 MG tablet Take 4 mg by mouth at bedtime.       Patient Stressors: Marital or family conflict Other: depression  Patient Strengths: Average or above average intelligence Communication skills General fund of knowledge Physical Health  Treatment Modalities: Medication Management, Group therapy, Case management,  1 to 1 session with clinician, Psychoeducation, Recreational therapy.   Physician Treatment Plan for Primary Diagnosis: Schizoaffective disorder, bipolar type (HCC) Long Term Goal(s): Improvement in symptoms so as ready for discharge Improvement in symptoms so as ready for discharge   Short  Term Goals: Ability to identify changes in lifestyle to reduce recurrence of condition will improve Ability to verbalize feelings will improve Ability to disclose and discuss suicidal ideas Ability to demonstrate self-control will improve Ability to identify and develop effective coping behaviors will improve Ability to maintain clinical measurements within normal limits will improve Compliance with prescribed medications will improve Ability to identify changes in lifestyle to reduce recurrence of condition will improve Ability to verbalize feelings will improve Ability to disclose and discuss suicidal ideas Ability to demonstrate self-control will improve Ability to identify and develop effective coping behaviors will improve Ability to maintain clinical measurements within normal limits will improve Compliance with prescribed medications will improve  Medication Management: Evaluate patient's response, side effects, and tolerance of medication regimen.  Therapeutic Interventions: 1 to 1 sessions, Unit Group sessions and Medication administration.  Evaluation of Outcomes: Not Progressing  Physician Treatment Plan for Secondary Diagnosis: Principal Problem:   Schizoaffective disorder, bipolar type (HCC) Active Problems:   Poor compliance   Pregnant   Hypertension  Long Term Goal(s): Improvement in symptoms so as ready for discharge Improvement in symptoms so as ready for discharge   Short Term Goals: Ability to identify changes in lifestyle to reduce recurrence of condition will improve Ability to verbalize feelings will improve Ability to disclose and discuss suicidal ideas Ability to demonstrate self-control will improve Ability to identify and develop effective coping behaviors will improve Ability to maintain clinical measurements within normal limits will improve Compliance with prescribed medications will improve Ability to identify changes in lifestyle to reduce  recurrence of condition will improve Ability to verbalize feelings will improve Ability to disclose and discuss suicidal ideas Ability to demonstrate self-control will improve Ability to identify and develop effective coping behaviors will improve Ability to maintain clinical measurements within normal limits will improve Compliance with prescribed medications will improve     Medication Management: Evaluate patient's response, side effects, and tolerance of medication regimen.  Therapeutic Interventions: 1 to 1 sessions, Unit Group sessions and Medication administration.  Evaluation of Outcomes: Not Progressing   RN Treatment Plan for Primary Diagnosis: Schizoaffective disorder, bipolar type (HCC) Long Term Goal(s): Knowledge of disease and therapeutic regimen to maintain health will improve  Short Term Goals: Ability to verbalize frustration and anger appropriately will improve, Ability to demonstrate self-control, Ability to identify and develop effective coping behaviors will improve and Compliance with prescribed medications will improve  Medication Management: RN will administer medications as ordered by provider, will assess and evaluate patient's  response and provide education to patient for prescribed medication. RN will report any adverse and/or side effects to prescribing provider.  Therapeutic Interventions: 1 on 1 counseling sessions, Psychoeducation, Medication administration, Evaluate responses to treatment, Monitor vital signs and CBGs as ordered, Perform/monitor CIWA, COWS, AIMS and Fall Risk screenings as ordered, Perform wound care treatments as ordered.  Evaluation of Outcomes: Not Progressing   LCSW Treatment Plan for Primary Diagnosis: Schizoaffective disorder, bipolar type (HCC) Long Term Goal(s): Safe transition to appropriate next level of care at discharge, Engage patient in therapeutic group addressing interpersonal concerns.  Short Term Goals: Engage patient  in aftercare planning with referrals and resources, Increase social support, Increase ability to appropriately verbalize feelings, Increase emotional regulation, Facilitate acceptance of mental health diagnosis and concerns, Identify triggers associated with mental health/substance abuse issues and Increase skills for wellness and recovery  Therapeutic Interventions: Assess for all discharge needs, 1 to 1 time with Social worker, Explore available resources and support systems, Assess for adequacy in community support network, Educate family and significant other(s) on suicide prevention, Complete Psychosocial Assessment, Interpersonal group therapy.  Evaluation of Outcomes: Not Progressing   Progress in Treatment: Attending groups: Yes. Participating in groups: Yes. Taking medication as prescribed: Yes. Toleration medication: Yes. Family/Significant other contact made: Yes, individual(s) contacted:  SPE completed with the patient's friend.  Patient understands diagnosis: Yes. Discussing patient identified problems/goals with staff: Yes. Medical problems stabilized or resolved: Yes. Denies suicidal/homicidal ideation: Yes. Issues/concerns per patient self-inventory: No. Other: none  New problem(s) identified: No, Describe:  none  New Short Term/Long Term Goal(s): elimination of symptoms of psychosis, medication management for mood stabilization; development of comprehensive mental wellness/sobriety plan.  Update 07/04/2020:  No changes at this time.   Patient Goals: "Give me something for my weight gain." Update 07/04/2020:  No changes at this time.   Discharge Plan or Barriers: CSW will continue to assess and develop plans with patient, however, patient remains disorganized. Update 07/04/2020:  Patient remains verbally aggressive towards staff and patients.  Patient stated that she would like follow up, however, has been unable to state where she plans on living at discharge.  CSW is  unsure if patient can return to her home or is homeless.  Patient intermittently refuses her medications.    Reason for Continuation of Hospitalization: Delusions  Medical Issues Medication stabilization  Estimated Length of Stay: 1-7 days  Recreational Therapy: Patient Stressors: N/A Patient Goal: Patient will engage in groups without prompting or encouragement from LRT x3 group sessions within 5 recreation therapy group sessions.  Attendees: Patient:  07/04/2020 9:52 AM  Physician: Les Pou, MD 07/04/2020 9:52 AM  Nursing:  07/04/2020 9:52 AM  RN Care Manager: 07/04/2020 9:52 AM  Social Worker: Penni Homans, LCSW 07/04/2020 9:52 AM  Recreational Therapist:  07/04/2020 9:52 AM  Other:   Vilma Meckel. Mahalia Longest 07/04/2020 9:52 AM  Other:  07/04/2020 9:52 AM  Other: 07/04/2020 9:52 AM    Scribe for Treatment Team: Harden Mo, LCSW 07/04/2020 9:52 AM

## 2020-07-05 NOTE — Progress Notes (Signed)
Recreation Therapy Notes   Date: 07/05/2020  Time: 9:30 am   Location: Craft room   Behavioral response: Appropriate  Intervention Topic: Coping Skills   Discussion/Intervention:  Group content on today was focused on coping skills. The group defined what coping skills are and when they normally use coping skills. Individuals described how they normally cope with thing and the coping skills they normally use. Patients expressed why it is important to cope with things and how not coping with things can affect you. The group participated in the intervention "My coping box" and made coping boxes while adding coping skills they could use in the future to the box. Clinical Observations/Feedback: Patient came to group and identified her coping skills as exercising and reading the bible. She explained that participating in bad coping skills lead into a bad addiction. Individual was social with peers and staff while participating in the intervention.  Antowan Samford LRT/CTRS         Dierre Crevier 07/05/2020 12:08 PM

## 2020-07-05 NOTE — [facility] Group Notes (Signed)
LCSW Group Therapy Note  07/05/2020 2:16 PM  Type of Therapy/Topic:  Group Therapy:  Feelings about Diagnosis  Participation Level:  Did Not Attend   Description of Group:   This group will allow patients to explore their thoughts and feelings about diagnoses they have received. Patients will be guided to explore their level of understanding and acceptance of these diagnoses. Facilitator will encourage patients to process their thoughts and feelings about the reactions of others to their diagnosis and will guide patients in identifying ways to discuss their diagnosis with significant others in their lives. This group will be process-oriented, with patients participating in exploration of their own experiences, giving and receiving support, and processing challenge from other group members.   Therapeutic Goals: 1. Patient will demonstrate understanding of diagnosis as evidenced by identifying two or more symptoms of the disorder 2. Patient will be able to express two feelings regarding the diagnosis 3. Patient will demonstrate their ability to communicate their needs through discussion and/or role play  Summary of Patient Progress: X  Therapeutic Modalities:   Cognitive Behavioral Therapy Brief Therapy Feelings Identification   Penni Homans, MSW, LCSW 07/05/2020 2:16 PM

## 2020-07-05 NOTE — Progress Notes (Signed)
Patient intrusive with staff this evening, continuing to come up to the nurses station asking for anything she can have. Patient had to be educated and she was compliant. Patient compliant with medication administration per MD orders. Patient given education, support, and encouragement to be active in her treatment plan. Patient being monitored Q 15 minutes for safety per unit protocol. Patient remains safe on the unit.  

## 2020-07-05 NOTE — Progress Notes (Signed)
[facility]  07/05/2020 10:23 AM Alyssa Mathis  MRN:  [id number]   Subjective:  Alyssa Mathis seen at bedside this morning. She is much more pleasant today, and cooperative with interview. Her negative STD screening results were given verbally, and she was very relieved that she and her baby were helpful. She feels that she is going to have a baby girl, and is excited for this. She continues to feel she does not have a mental illness despite disclosing diagnosis of schizoaffective disorder, bipolar type. However, she is aware olanzapine is an antipsychotic, and remains willing to take this medication as it has greatly assisted with her appetite and weight gain. She remains intrusive and somewhat demanding of night shift and certain staff members. However, she is not requesting extra food to help care for her child. She has shown tremendous improvement with Olanzapine, and will plan to continue. She has also become more compliant with labetalol and blood pressure has stabilized. She remains an extremely high risk pregnancy given history of noncompliance with all medications. Currently there is not a safe disposition plan as she has recently been evicted from home. Now that her paranoia and mania are clearing will begin to look at potential discharge destinations with social work team.   Principal Problem: Schizoaffective disorder, bipolar type (HCC) Diagnosis: Principal Problem:   Schizoaffective disorder, bipolar type (HCC) Active Problems:   Poor compliance   Pregnant   Hypertension  Total Time spent with patient: 30 minutes  Past Psychiatric History: History of schizoaffective disorder, bipolar type. She was hospitalized most recently in May 2021, and stabalized on Risperdal 4 mg QHS. She has failed monotherapy with depakote, trilleptal, and Abilify in the past. She had a dystonic reaction to [place]. [facility], but did not attend  appointments. [facility]   Past Medical History:  Past Medical History:  Diagnosis Date  . Anemia   . Hypertension   . Pregnancy induced hypertension     Past Surgical History:  Procedure Laterality Date  . NO PAST SURGERIES     Family History:  Family History  Problem Relation Age of Onset  . Hypertension Sister   . Hypertension Maternal Grandmother   . Diabetes Maternal Grandfather   . Hypertension Maternal Grandfather    Family Psychiatric  History: Denies Social History:  Social History   Substance and Sexual Activity  Alcohol Use Never     Social History   Substance and Sexual Activity  Drug Use Not on file    Social History   Socioeconomic History  . Marital status: Single    Spouse name: Not on file  . Number of children: Not on file  . Years of education: Not on file  . Highest education level: Not on file  Occupational History  . Not on file  Tobacco Use  . Smoking status: Never Smoker  . Smokeless tobacco: Never Used  Substance and Sexual Activity  . Alcohol use: Never  . Drug use: Not on file  . Sexual activity: Yes  Other Topics Concern  . Not on file  Social History Narrative  . Not on file   Social Determinants of Health   Financial Resource Strain:   . Difficulty of Paying Living Expenses: Not on file  Food Insecurity:   . Worried About Programme researcher, broadcasting/film/videounning Out of Food in the Last Year: Not on file  . Ran Out  of Food in the Last Year: Not on file  Transportation Needs:   . Lack of Transportation (Medical): Not on file  . Lack of Transportation (Non-Medical): Not on file  Physical Activity:   . Days of Exercise per Week: Not on file  . Minutes of Exercise per Session: Not on file  Stress:   . Feeling of Stress : Not on file  Social Connections:   . Frequency of Communication with Friends and Family: Not on file  . Frequency of Social Gatherings with Friends and Family: Not on file  .  Attends Religious Services: Not on file  . Active Member of Clubs or Organizations: Not on file  . Attends Banker Meetings: Not on file  . Marital Status: Not on file   Additional Social History:       Sleep: Good  Appetite:  Good  Current Medications: Current Facility-Administered Medications  Medication Dose Route Frequency Provider Last Rate Last Admin  . acetaminophen (TYLENOL) tablet 650 mg  650 mg Oral Q6H PRN [name], [name], MD      . alum & mag hydroxide-simeth (MAALOX/MYLANTA) 200-200-20 MG/5ML suspension 30 mL  30 mL Oral Q4H PRN [name], [name], MD   30 mL at 07/04/20 0644  . amLODipine (NORVASC) tablet 5 mg  5 mg Oral Q breakfast [name], [name], MD   5 mg at 07/05/20 0828  . amoxicillin (AMOXIL) capsule 500 mg  500 mg Oral TID [name], [name], MD   500 mg at 07/05/20 0258  . aspirin chewable tablet 81 mg  81 mg Oral Daily [name], [name], MD   81 mg at 07/05/20 0829  . feeding supplement (ENSURE ENLIVE) (ENSURE ENLIVE) liquid 237 mL  237 mL Oral TID BM [name], MD   237 mL at 07/05/20 0953  . ferrous sulfate tablet 325 mg  325 mg Oral Q breakfast [name], MD   325 mg at 07/05/20 5277  . labetalol (NORMODYNE) tablet 200 mg  200 mg Oral BID [name], MD   200 mg at 07/05/20 8242  . magnesium hydroxide (MILK OF MAGNESIA) suspension 30 mL  30 mL Oral Daily PRN [name], [name], MD      . metoCLOPramide (REGLAN) tablet 10 mg  10 mg Oral Q8H PRN [name], [name], MD      . OLANZapine (ZYPREXA) tablet 5 mg  5 mg Oral BID [name], MD   5 mg at 07/05/20 3536   Or  . OLANZapine (ZYPREXA) injection 5 mg  5 mg Intramuscular BID [name], MD      . OLANZapine [facility]) tablet 2.5 mg  2.5 mg Oral Q6H PRN [name], MD      . prenatal vitamin w/FE, FA (PRENATAL 1 + 1) 27-1 MG tablet 1 tablet  1 tablet Oral q AM [name], MD   1 tablet at 07/05/20 1443    Lab Results: No results found for this or any previous visit  (from the past 48 hour(s)).  Blood Alcohol level:  Lab Results  Component Value Date   ETH <10 06/27/2020    Metabolic Disorder Labs: Lab Results  Component Value Date   HGBA1C 4.8 06/27/2020   MPG 91 06/27/2020   No results found for: PROLACTIN Lab Results  Component Value Date   CHOL 182 06/27/2020   TRIG 61 06/27/2020   HDL 88 06/27/2020   CHOLHDL 2.1 06/27/2020   VLDL 12  06/27/2020   LDLCALC 82 06/27/2020    Physical Findings: AIMS: Facial and Oral Movements Muscles of Facial Expression: None, normal Lips and Perioral Area: None, normal Jaw: None, normal Tongue: None, normal,Extremity Movements Upper (arms, wrists, hands, fingers): None, normal Lower (legs, knees, ankles, toes): None, normal, Trunk Movements Neck, shoulders, hips: None, normal, Overall Severity Severity of abnormal movements (highest score from questions above): None, normal Incapacitation due to abnormal movements: None, normal Patient's awareness of abnormal movements (rate only patient's report): No Awareness, Dental Status Current problems with teeth and/or dentures?: No Does patient usually wear dentures?: No  CIWA:    COWS:     Musculoskeletal: Strength & Muscle Tone: within normal limits Gait & Station: normal Patient leans: N/A  Psychiatric Specialty Exam: Physical Exam Constitutional:      Appearance: Normal appearance.  HENT:     Head: Normocephalic and atraumatic.     Right Ear: External ear normal.     Left Ear: External ear normal.     Nose: Nose normal.     Mouth/Throat:     Mouth: Mucous membranes are moist.     Pharynx: Oropharynx is clear.  Eyes:     Extraocular Movements: Extraocular movements intact.     Conjunctiva/sclera: Conjunctivae normal.     Pupils: Pupils are equal, round, and reactive to light.  Cardiovascular:     Rate and Rhythm: Normal rate.     Pulses: Normal pulses.  Pulmonary:     Effort: Pulmonary effort is normal.     Breath sounds: Normal  breath sounds.  Abdominal:     General: Abdomen is flat.     Palpations: Abdomen is soft.  Musculoskeletal:        General: No swelling. Normal range of motion.     Cervical back: Normal range of motion and neck supple.  Skin:    General: Skin is warm and dry.  Neurological:     General: No focal deficit present.     Mental Status: She is alert and oriented to person, place, and time.  Psychiatric:        Attention and Perception: Attention normal.        Mood and Affect: Affect is angry.        Speech: Speech normal.        Behavior: Behavior is agitated.        Thought Content: Thought content is paranoid.        Cognition and Memory: Cognition is impaired.        Judgment: Judgment is impulsive.     Review of Systems  Constitutional: Positive for activity change. Negative for fatigue.  HENT: Negative for rhinorrhea and sore throat.   Eyes: Negative for photophobia and visual disturbance.  Respiratory: Negative for cough and shortness of breath.   Cardiovascular: Negative for chest pain and palpitations.  Gastrointestinal: Negative for constipation, diarrhea, nausea and vomiting.  Endocrine: Negative for cold intolerance and heat intolerance.  Genitourinary: Negative for difficulty urinating and dysuria.  Musculoskeletal: Negative for back pain and myalgias.  Skin: Negative for rash and wound.  Allergic/Immunologic: Negative for environmental allergies and food allergies.  Neurological: Negative for dizziness and headaches.  Hematological: Negative for adenopathy. Does not bruise/bleed easily.  Psychiatric/Behavioral: Negative for hallucinations and suicidal ideas.    Blood pressure 138/75, pulse 82, temperature 98.2 F (36.8 C), temperature source Oral, resp. rate 18, height 5\' 6"  (1.676 m), weight 56.7 kg, SpO2 100 %, unknown if currently breastfeeding.Body mass index  is 20.18 kg/m.  General Appearance: Casual  Eye Contact:  Good  Speech:  Normal Rate  Volume:  Normal   Mood:  Irritable  Affect:  Congruent  Thought Process:  Coherent  Orientation:  Full (Time, Place, and Person)  Thought Content:  Logical  Suicidal Thoughts:  No  Homicidal Thoughts:  No  Memory:  Immediate;   Fair Recent;   Fair Remote;   Fair  Judgement:  Intact  Insight:  Lacking  Psychomotor Activity:  Normal  Concentration:  Concentration: Fair and Attention Span: Fair  Recall:  [employer] of Knowledge:  Fair  Language:  Fair  Akathisia:  Negative  Handed:  Right  AIMS (if indicated):     Assets:  Communication Skills Desire for Improvement Resilience Social Support  ADL's:  Intact  Cognition:  WNL  Sleep:  Number of Hours: 7.75     Treatment Plan Summary: Daily contact with patient to assess and evaluate symptoms and progress in treatment, Medication management and Plan continue medications as above. Will begin looking into disposition options now that Ms. Boesel psychiatric condition is starting to improve.   [name], MD 07/05/2020, 10:23 AM

## 2020-07-05 NOTE — Progress Notes (Signed)
D: Pt alert and oriented. Pt denies experiencing any anxiety/depression at this time. Pt denies experiencing any pain at this time. Pt denies experiencing any SI/HI, or AVH at this time.   Pt was very demanding and aggressive this morning in regards to her medications. Pt did eventually calm down and become more pleasant.   A: Scheduled medications administered to pt, per MD orders. Support and encouragement provided. Frequent verbal contact made. Routine safety checks conducted q15 minutes.   R: No adverse drug reactions noted. Pt verbally contracts for safety at this time. Pt complaint with medications. Pt interacts well with others on the unit. Pt remains safe at this time. Will continue to monitor.

## 2020-07-05 NOTE — Plan of Care (Signed)
Patient improving, not as intrusive as she has been during this admission   Problem: Education: Goal: Emotional status will improve Outcome: Progressing Goal: Mental status will improve Outcome: Progressing

## 2020-07-06 NOTE — [facility] Group Notes (Signed)
LCSW Group Therapy Note  07/06/2020 2:02 PM  Type of Therapy/Topic:  Group Therapy:  Emotion Regulation  Participation Level:  Did Not Attend   Description of Group:   The purpose of this group is to assist patients in learning to regulate negative emotions and experience positive emotions. Patients will be guided to discuss ways in which they have been vulnerable to their negative emotions. These vulnerabilities will be juxtaposed with experiences of positive emotions or situations, and patients will be challenged to use positive emotions to combat negative ones. Special emphasis will be placed on coping with negative emotions in conflict situations, and patients will process healthy conflict resolution skills.  Therapeutic Goals: 1. Patient will identify two positive emotions or experiences to reflect on in order to balance out negative emotions 2. Patient will label two or more emotions that they find the most difficult to experience 3. Patient will demonstrate positive conflict resolution skills through discussion and/or role plays  Summary of Patient Progress: X  Therapeutic Modalities:   Cognitive Behavioral Therapy Feelings Identification Dialectical Behavioral Therapy  Alyssa Mathis R. Algis Greenhouse, MSW, LCSW, LCAS 07/06/2020 2:02 PM

## 2020-07-06 NOTE — Progress Notes (Signed)
[facility]  07/06/2020 1:17 PM Alyssa Mathis  MRN:  [id number]   Subjective:  [name] seen at bedside this morning. She is much more pleasant today, and cooperative with interview.  She feels that she is going to have a baby girl, and is excited for this. She has been cooperative with staff, and far less demanding. She would like to return to live with Onalee Huaavid at discharge, and continue seeing her current OBGYN doctor for prenatal care. Will continue to work on safe discharge planning.   Principal Problem: Schizoaffective disorder, bipolar type (HCC) Diagnosis: Principal Problem:   Schizoaffective disorder, bipolar type (HCC) Active Problems:   Poor compliance   Pregnant   Hypertension  Total Time spent with patient: 30 minutes  Past Psychiatric History: History of schizoaffective disorder, bipolar type. She was hospitalized most recently in May 2021, and stabalized on Risperdal 4 mg QHS. She has failed monotherapy with depakote, trilleptal, and Abilify in the past. She had a dystonic reaction to [place]. She was [facility], but did not attend appointments. [facility]   Past Medical History:  Past Medical History:  Diagnosis Date  . Anemia   . Hypertension   . Pregnancy induced hypertension     Past Surgical History:  Procedure Laterality Date  . NO PAST SURGERIES     Family History:  Family History  Problem Relation Age of Onset  . Hypertension Sister   . Hypertension Maternal Grandmother   . Diabetes Maternal Grandfather   . Hypertension Maternal Grandfather    Family Psychiatric  History: Denies Social History:  Social History   Substance and Sexual Activity  Alcohol Use Never     Social History   Substance and Sexual Activity  Drug Use Not on file    Social History   Socioeconomic History  . Marital status: Single    Spouse name: Not on file  .  Number of children: Not on file  . Years of education: Not on file  . Highest education level: Not on file  Occupational History  . Not on file  Tobacco Use  . Smoking status: Never Smoker  . Smokeless tobacco: Never Used  Substance and Sexual Activity  . Alcohol use: Never  . Drug use: Not on file  . Sexual activity: Yes  Other Topics Concern  . Not on file  Social History Narrative  . Not on file   Social Determinants of Health   Financial Resource Strain:   . Difficulty of Paying Living Expenses: Not on file  Food Insecurity:   . Worried About Programme researcher, broadcasting/film/videounning Out of Food in the Last Year: Not on file  . Ran Out of Food in the Last Year: Not on file  Transportation Needs:   . Lack of Transportation (Medical): Not on file  . Lack of Transportation (Non-Medical): Not on file  Physical Activity:   . Days of Exercise per Week: Not on file  . Minutes of Exercise per Session: Not on file  Stress:   . Feeling of Stress : Not on file  Social Connections:   . Frequency of Communication with Friends and Family: Not on file  . Frequency of Social Gatherings with Friends and Family: Not on file  . Attends Religious Services: Not on file  . Active Member of Clubs or Organizations: Not on file  . Attends BankerClub or Organization Meetings: Not on file  .  Marital Status: Not on file   Additional Social History:       Sleep: Good  Appetite:  Good  Current Medications: Current Facility-Administered Medications  Medication Dose Route Frequency Provider Last Rate Last Admin  . acetaminophen (TYLENOL) tablet 650 mg  650 mg Oral Q6H PRN [name], [name], MD      . alum & mag hydroxide-simeth (MAALOX/MYLANTA) 200-200-20 MG/5ML suspension 30 mL  30 mL Oral Q4H PRN [name], [name], MD   30 mL at 07/04/20 0644  . amLODipine (NORVASC) tablet 5 mg  5 mg Oral Q breakfast [name], [name], MD   5 mg at 07/06/20 0755  . aspirin chewable tablet 81 mg  81 mg Oral Daily [name], [name], MD   81 mg at  07/06/20 0755  . feeding supplement (ENSURE ENLIVE) (ENSURE ENLIVE) liquid 237 mL  237 mL Oral TID BM [name], MD   237 mL at 07/06/20 1000  . ferrous sulfate tablet 325 mg  325 mg Oral Q breakfast [name], MD   325 mg at 07/06/20 0754  . labetalol (NORMODYNE) tablet 200 mg  200 mg Oral BID [name], MD   200 mg at 07/06/20 0755  . magnesium hydroxide (MILK OF MAGNESIA) suspension 30 mL  30 mL Oral Daily PRN [name], [name], MD      . metoCLOPramide (REGLAN) tablet 10 mg  10 mg Oral Q8H PRN [name], [name], MD      . OLANZapine (ZYPREXA) tablet 5 mg  5 mg Oral BID [name], MD   5 mg at 07/06/20 0755   Or  . OLANZapine (ZYPREXA) injection 5 mg  5 mg Intramuscular BID [name], MD      . OLANZapine [facility]) tablet 2.5 mg  2.5 mg Oral Q6H PRN [name], MD      . prenatal vitamin w/FE, FA (PRENATAL 1 + 1) 27-1 MG tablet 1 tablet  1 tablet Oral q AM [name], MD   1 tablet at 07/06/20 2500    Lab Results: No results found for this or any previous visit (from the past 48 hour(s)).  Blood Alcohol level:  Lab Results  Component Value Date   ETH <10 06/27/2020    Metabolic Disorder Labs: Lab Results  Component Value Date   HGBA1C 4.8 06/27/2020   MPG 91 06/27/2020   No results found for: PROLACTIN Lab Results  Component Value Date   CHOL 182 06/27/2020   TRIG 61 06/27/2020   HDL 88 06/27/2020   CHOLHDL 2.1 06/27/2020   VLDL 12 06/27/2020   LDLCALC 82 06/27/2020    Physical Findings: AIMS: Facial and Oral Movements Muscles of Facial Expression: None, normal Lips and Perioral Area: None, normal Jaw: None, normal Tongue: None, normal,Extremity Movements Upper (arms, wrists, hands, fingers): None, normal Lower (legs, knees, ankles, toes): None, normal, Trunk Movements Neck, shoulders, hips: None, normal, Overall Severity Severity of abnormal movements (highest score from questions above): None, normal Incapacitation due to  abnormal movements: None, normal Patient's awareness of abnormal movements (rate only patient's report): No Awareness, Dental Status Current problems with teeth and/or dentures?: No Does patient usually wear dentures?: No  CIWA:    COWS:     Musculoskeletal: Strength & Muscle Tone: within normal limits Gait & Station: normal Patient leans: N/A  Psychiatric Specialty Exam: Physical Exam Constitutional:      Appearance: Normal appearance.  HENT:     Head: Normocephalic  and atraumatic.     Right Ear: External ear normal.     Left Ear: External ear normal.     Nose: Nose normal.     Mouth/Throat:     Mouth: Mucous membranes are moist.     Pharynx: Oropharynx is clear.  Eyes:     Extraocular Movements: Extraocular movements intact.     Conjunctiva/sclera: Conjunctivae normal.     Pupils: Pupils are equal, round, and reactive to light.  Cardiovascular:     Rate and Rhythm: Normal rate.     Pulses: Normal pulses.  Pulmonary:     Effort: Pulmonary effort is normal.     Breath sounds: Normal breath sounds.  Abdominal:     General: Abdomen is flat.     Palpations: Abdomen is soft.  Musculoskeletal:        General: No swelling. Normal range of motion.     Cervical back: Normal range of motion and neck supple.  Skin:    General: Skin is warm and dry.  Neurological:     General: No focal deficit present.     Mental Status: She is alert and oriented to person, place, and time.  Psychiatric:        Attention and Perception: Attention normal.        Mood and Affect: Affect is angry.        Speech: Speech normal.        Behavior: Behavior is agitated.        Thought Content: Thought content is paranoid.        Cognition and Memory: Cognition is impaired.        Judgment: Judgment is impulsive.     Review of Systems  Constitutional: Positive for activity change. Negative for fatigue.  HENT: Negative for rhinorrhea and sore throat.   Eyes: Negative for photophobia and visual  disturbance.  Respiratory: Negative for cough and shortness of breath.   Cardiovascular: Negative for chest pain and palpitations.  Gastrointestinal: Negative for constipation, diarrhea, nausea and vomiting.  Endocrine: Negative for cold intolerance and heat intolerance.  Genitourinary: Negative for difficulty urinating and dysuria.  Musculoskeletal: Negative for back pain and myalgias.  Skin: Negative for rash and wound.  Allergic/Immunologic: Negative for environmental allergies and food allergies.  Neurological: Negative for dizziness and headaches.  Hematological: Negative for adenopathy. Does not bruise/bleed easily.  Psychiatric/Behavioral: Negative for hallucinations and suicidal ideas.    Blood pressure 136/84, pulse 64, temperature 98 F (36.7 C), temperature source Oral, resp. rate 17, height 5\' 6"  (1.676 m), weight 62.8 kg, SpO2 100 %, unknown if currently breastfeeding.Body mass index is 22.35 kg/m.  General Appearance: Casual  Eye Contact:  Good  Speech:  Normal Rate  Volume:  Normal  Mood:  Irritable  Affect:  Congruent  Thought Process:  Coherent  Orientation:  Full (Time, Place, and Person)  Thought Content:  Logical  Suicidal Thoughts:  No  Homicidal Thoughts:  No  Memory:  Immediate;   Fair Recent;   Fair Remote;   Fair  Judgement:  Intact  Insight:  Lacking  Psychomotor Activity:  Normal  Concentration:  Concentration: Fair and Attention Span: Fair  Recall:  of Knowledge:  Fair  Language:  Fair  Akathisia:  Negative  Handed:  Right  AIMS (if indicated):     Assets:  Communication Skills Desire for Improvement Resilience Social Support  ADL's:  Intact  Cognition:  WNL  Sleep:  Number of Hours: 8.25  Treatment Plan Summary: Daily contact with patient to assess and evaluate symptoms and progress in treatment, Medication management and Plan continue medications as above. Will begin looking into disposition options now that Ms. Menzer  psychiatric condition is starting to improve. Tentative discharge on Friday.   [name], MD 07/06/2020, 1:17 PM

## 2020-07-06 NOTE — Progress Notes (Signed)
Recreation Therapy Notes    Date: 07/06/2020  Time: 9:30 am   Location: Craft room     Behavioral response: N/A   Intervention Topic: Teamwork   Discussion/Intervention: Patient did not attend group.   Clinical Observations/Feedback:  Patient did not attend group.   Torri Langston LRT/CTRS        Sai Moura 07/06/2020 12:05 PM

## 2020-07-06 NOTE — Progress Notes (Signed)
D: Pt alert and oriented. Pt rates depression 0/10, hopelessness 0/10, and anxiety 0/10. Pt goal: "Focusing on getting out of here so I can go be with my children." Pt concentration as being good. Pt reports sleep last night as being good. Pt did not receive medications for sleep. Pt denies experiencing any pain at this time. Pt denies experiencing any SI/HI, or AVH at this time.   A: Scheduled medications administered to pt, per MD orders. Support and encouragement provided. Frequent verbal contact made. Routine safety checks conducted q15 minutes.   R: No adverse drug reactions noted. Pt verbally contracts for safety at this time. Pt complaint with medications and treatment plan. Pt interacts well with others on the unit. Pt remains safe at this time. Will continue to monitor.

## 2020-07-06 NOTE — Progress Notes (Signed)
Cooperative and pleasant on approach. She interacted well with peers and staff. Patient denies SI, HI, AVH. No issues to report on shift at this time.  

## 2020-07-07 DIAGNOSIS — F250 Schizoaffective disorder, bipolar type: Secondary | ICD-10-CM

## 2020-07-07 MED ORDER — LABETALOL HCL 200 MG PO TABS
200.0000 mg | ORAL_TABLET | Freq: Two times a day (BID) | ORAL | 1 refills | Status: DC
Start: 2020-07-07 — End: 2020-12-18

## 2020-07-07 MED ORDER — AMLODIPINE BESYLATE 5 MG PO TABS
5.0000 mg | ORAL_TABLET | Freq: Every day | ORAL | 1 refills | Status: DC
Start: 2020-07-08 — End: 2020-12-18

## 2020-07-07 MED ORDER — OLANZAPINE 5 MG PO TABS
5.0000 mg | ORAL_TABLET | Freq: Two times a day (BID) | ORAL | 1 refills | Status: DC
Start: 2020-07-07 — End: 2020-12-18

## 2020-07-07 NOTE — Discharge Summary (Signed)
Physician Discharge Summary Note  Patient:  Alyssa Mathis is an 34 y.o., female MRN:  [id number] DOB:  June 03, 1986 Patient phone:  [phone] (home)  Patient address:   [address] [place] [address],  Total Time spent with patient: 30 minutes  Date of Admission:  06/28/2020 Date of Discharge: 07/07/2020  Reason for Admission:  Acute psychosis and mania in the setting of medication noncompliance.   Principal Problem: Schizoaffective disorder, bipolar type [facility]) Discharge Diagnoses: Principal Problem:   Schizoaffective disorder, bipolar type (HCC) Active Problems:   Poor compliance   Pregnant   Hypertension   Past Psychiatric History: History of schizoaffective disorder, bipolar type. She was hospitalized most recently in May 2021, and stabalized on Risperdal 4 mg QHS. She has failed monotherapy with depakote, trilleptal, and Abilify in the past. She had a dystonic reaction to [place]. She was scheduled to follow-up with [facility] outpatient, but did not attend appointments. She was also hospitalized at [facility] December of 2020 for 21 days  Past Medical History:  Past Medical History:  Diagnosis Date  . Anemia   . Hypertension   . Pregnancy induced hypertension     Past Surgical History:  Procedure Laterality Date  . NO PAST SURGERIES     Family History:  Family History  Problem Relation Age of Onset  . Hypertension Sister   . Hypertension Maternal Grandmother   . Diabetes Maternal Grandfather   . Hypertension Maternal Grandfather    Family Psychiatric  History: Denies Social History:  Social History   Substance and Sexual Activity  Alcohol Use Never     Social History   Substance and Sexual Activity  Drug Use Not on file    Social History   Socioeconomic History  . Marital status: Single    Spouse name: Not on file  . Number of children: Not on file  . Years of education: Not on file  . Highest education level: Not on file  Occupational  History  . Not on file  Tobacco Use  . Smoking status: Never Smoker  . Smokeless tobacco: Never Used  Substance and Sexual Activity  . Alcohol use: Never  . Drug use: Not on file  . Sexual activity: Yes  Other Topics Concern  . Not on file  Social History Narrative  . Not on file   Social Determinants of Health   Financial Resource Strain:   . Difficulty of Paying Living Expenses: Not on file  Food Insecurity:   . Worried About Programme researcher, broadcasting/film/video in the Last Year: Not on file  . Ran Out of Food in the Last Year: Not on file  Transportation Needs:   . Lack of Transportation (Medical): Not on file  . Lack of Transportation (Non-Medical): Not on file  Physical Activity:   . Days of Exercise per Week: Not on file  . Minutes of Exercise per Session: Not on file  Stress:   . Feeling of Stress : Not on file  Social Connections:   . Frequency of Communication with Friends and Family: Not on file  . Frequency of Social Gatherings with Friends and Family: Not on file  . Attends Religious Services: Not on file  . Active Member of Clubs or Organizations: Not on file  . Attends Banker Meetings: Not on file  . Marital Status: Not on file    Hospital Course:  Patient presented to emergency department with frank psychosis and mania. She was disorganized, and hyperreligious. She had  great paranoia about her family trying to steal her children and take the house away from him. On admission she was only sleeping around 1 hour, has pressured speech, flight of ideas, and disorganized thoughts. Initially she was placed on Risperdal as she responded to this medication in the past, however, she refused to take this medication. During her entire stay she continued to deny she had a mental health diagnosis or history of hypertension. She would deny all symptoms of each that were provided to her. However, she was fully aware that she was taking an antipsychotic named olanzapine, and agreed  to keep taking this through hospital stay since it assisted with relieving nausea, increasing appetite, and causing weight gain. Similarly she continued to take amlodipine and labatelol despite denying her blood pressure was high. At time of discharge she was extremely focused on attending her prenatal appointments. Her main focus was ensuring that her unborn child was well nourished and taken care of. She frequently requested additional food to help her child. Similarly, she was focused on returning home to care for her three sons. She exhibited no self harm behaviors in the hospital. She denies suicidal ideations, homicidal ideations, visual hallucinations, and auditory hallucinations. Her thought pattern is clear, and her speech coherent. At this time, patient and treatment team feel she is safe to discharge home with close outpatient follow-up. She has been encouraged to continue olanzapine, labatelol, and amlodipine, and patient is agreeable to this plan.   Physical Findings: AIMS: Facial and Oral Movements Muscles of Facial Expression: None, normal Lips and Perioral Area: None, normal Jaw: None, normal Tongue: None, normal,Extremity Movements Upper (arms, wrists, hands, fingers): None, normal Lower (legs, knees, ankles, toes): None, normal, Trunk Movements Neck, shoulders, hips: None, normal, Overall Severity Severity of abnormal movements (highest score from questions above): None, normal Incapacitation due to abnormal movements: None, normal Patient's awareness of abnormal movements (rate only patient's report): No Awareness, Dental Status Current problems with teeth and/or dentures?: No Does patient usually wear dentures?: No  CIWA:    COWS:     Musculoskeletal: Strength & Muscle Tone: within normal limits Gait & Station: normal Patient leans: N/A  Psychiatric Specialty Exam: Physical Exam  Review of Systems  Constitutional: Negative for activity change and fatigue.  HENT:  Negative for rhinorrhea and sore throat.   Eyes: Negative for photophobia and visual disturbance.  Respiratory: Negative for cough and shortness of breath.   Cardiovascular: Negative for chest pain and palpitations.  Gastrointestinal: Negative for constipation, diarrhea, nausea and vomiting.  Endocrine: Negative for cold intolerance and heat intolerance.  Genitourinary: Negative for difficulty urinating and dysuria.  Musculoskeletal: Negative for back pain and myalgias.  Skin: Negative for rash and wound.  Allergic/Immunologic: Negative for food allergies and immunocompromised state.  Neurological: Negative for dizziness and headaches.  Hematological: Negative for adenopathy. Does not bruise/bleed easily.  Psychiatric/Behavioral: Negative for dysphoric mood, hallucinations, sleep disturbance and suicidal ideas.    Blood pressure 130/87, pulse 63, temperature 98.4 F (36.9 C), temperature source Oral, resp. rate 17, height 5\' 6"  (1.676 m), weight 62.8 kg, SpO2 100 %, unknown if currently breastfeeding.Body mass index is 22.35 kg/m.  General Appearance: Casual  Eye Contact::  Good  Speech:  Normal Rate  Volume:  Normal  Mood:  Euthymic  Affect:  Congruent  Thought Process:  Coherent and Linear  Orientation:  Full (Time, Place, and Person)  Thought Content:  Logical  Suicidal Thoughts:  No  Homicidal Thoughts:  No  Memory:  Immediate;   Fair Recent;   Fair Remote;   Fair  Judgement:  Fair  Insight:  Fair  Psychomotor Activity:  Normal  Concentration:  Fair  Recall:  Fair  Fund of Knowledge:Fair  Language: Good  Akathisia:  Negative  Handed:  Right  AIMS (if indicated):     Assets:  Communication Skills Desire for Improvement Financial Resources/Insurance Housing Intimacy Physical Health Resilience Social Support Talents/Skills  Sleep:  Number of Hours: 8.25  Cognition: WNL  ADL's:  Intact            Has this patient used any form of tobacco in the last 30 days?  (Cigarettes, Smokeless Tobacco, Cigars, and/or Pipes) Yes, No  Blood Alcohol level:  Lab Results  Component Value Date   ETH <10 06/27/2020    Metabolic Disorder Labs:  Lab Results  Component Value Date   HGBA1C 4.8 06/27/2020   MPG 91 06/27/2020   No results found for: PROLACTIN Lab Results  Component Value Date   CHOL 182 06/27/2020   TRIG 61 06/27/2020   HDL 88 06/27/2020   CHOLHDL 2.1 06/27/2020   VLDL 12 06/27/2020   LDLCALC 82 06/27/2020    See Psychiatric Specialty Exam and Suicide Risk Assessment completed by Attending Physician prior to discharge.  Discharge destination:  Home  Is patient on multiple antipsychotic therapies at discharge:  No   Has Patient had three or more failed trials of antipsychotic monotherapy by history:  No  Recommended Plan for Multiple Antipsychotic Therapies: NA  Discharge Instructions    Diet general   Complete by: As directed    Increase activity slowly   Complete by: As directed      Allergies as of 07/08/2020      Reactions   Nifedipine    Other reaction(s): Unknown   Paliperidone Other (See Comments)   Dystonia      Medication List    STOP taking these medications   amoxicillin 500 MG capsule Commonly known as: AMOXIL   benztropine 1 MG tablet Commonly known as: COGENTIN   Diclegis 10-10 MG Tbec Generic drug: Doxylamine-Pyridoxine   hydrOXYzine 25 MG tablet Commonly known as: ATARAX/VISTARIL   metoCLOPramide 10 MG tablet Commonly known as: REGLAN   Oxcarbazepine 300 MG tablet Commonly known as: TRILEPTAL   risperidone 4 MG tablet Commonly known as: RISPERDAL     TAKE these medications     Indication  amLODipine 5 MG tablet Commonly known as: NORVASC Take 1 tablet (5 mg total) by mouth daily with breakfast. What changed: when to take this  Indication: High Blood Pressure Disorder   FeroSul 325 (65 FE) MG tablet Generic drug: ferrous sulfate Take 325 mg by mouth every morning.  Indication:  Iron Deficiency   labetalol 200 MG tablet Commonly known as: NORMODYNE Take 1 tablet (200 mg total) by mouth 2 (two) times daily.  Indication: High Blood Pressure Disorder   OLANZapine 5 MG tablet Commonly known as: ZYPREXA Take 1 tablet (5 mg total) by mouth 2 (two) times daily.  Indication: Manic Phase of Manic-Depression   PNV-DHA 27-0.6-0.4-300 MG Caps Take 1 capsule by mouth daily.  Indication: Pregnancy   promethazine 25 MG tablet Commonly known as: PHENERGAN Take 25 mg by mouth every 6 (six) hours as needed.  Indication: Nausea and Vomiting       Follow-up Information    [facility] Follow up.   Why: Your appointment is scheduled for 07/13/2020 at 12:30PM.  Appointment  is face to face.  Please bring your prescriptions to the appointment.  Thanks! Contact information: [address] [place] [address] [phone]               Follow-up recommendations:  Activity:  as tolerated Diet:  regular diet  Comments:   30 day script and 1 refill for Olanzapine 5 mg BID, Labetolol 200 mg BID, and Amlodipine 5 mg daily were sent directly to [facility] in [place].   Signed: Jesse SansMegan M Jemuel Laursen, MD 07/08/2020, 8:23 AM

## 2020-07-07 NOTE — BHH Group Notes (Signed)
LCSW Group Therapy Note  07/07/2020 2:56 PM  Type of Therapy/Topic:  Group Therapy:  Balance in Life  Participation Level:  Did Not Attend  Description of Group:    This group will address the concept of balance and how it feels and looks when one is unbalanced. Patients will be encouraged to process areas in their lives that are out of balance and identify reasons for remaining unbalanced. Facilitators will guide patients in utilizing problem-solving interventions to address and correct the stressor making their life unbalanced. Understanding and applying boundaries will be explored and addressed for obtaining and maintaining a balanced life. Patients will be encouraged to explore ways to assertively make their unbalanced needs known to significant others in their lives, using other group members and facilitator for support and feedback.  Therapeutic Goals: 1. Patient will identify two or more emotions or situations they have that consume much of in their lives. 2. Patient will identify signs/triggers that life has become out of balance:  3. Patient will identify two ways to set boundaries in order to achieve balance in their lives:  4. Patient will demonstrate ability to communicate their needs through discussion and/or role plays  Summary of Patient Progress: X  Therapeutic Modalities:   Cognitive Behavioral Therapy Solution-Focused Therapy Assertiveness Training  Simona Huh R. Algis Greenhouse, MSW, LCSW, LCAS 07/07/2020 2:56 PM

## 2020-07-07 NOTE — Progress Notes (Signed)
Patient initially stated that she was not going to take the Olanzapine after what she heard in Pharmacy group. This Clinical research associate explained to patient that there is an order that if she doesn't take it orally, there is an order for her to get it IM. Patient stated that she would take it orally because she does not want the shot.

## 2020-07-07 NOTE — BHH Counselor (Signed)
CSW met with patient per request. She shared concerns regarding her discharge disposition. These were discussed at length. Patient eventually endorsed plans to return to the home with Shanon Brow, because it is her home as well, and stated that his [[employer]] will be providing transportation for her home. She did ask for resources/housing options. No other concerns expressed. Contact ended without issues.   Chalmers Guest. Guerry Bruin, MSW, LCSW, [place] 07/07/2020 3:00 PM

## 2020-07-07 NOTE — BHH Suicide Risk Assessment (Signed)
[facility] Discharge Suicide Risk Assessment   Principal Problem: Schizoaffective disorder, bipolar type (HCC) Discharge Diagnoses: Principal Problem:   Schizoaffective disorder, bipolar type (HCC) Active Problems:   Poor compliance   Pregnant   Hypertension   Total Time spent with patient: 30 minutes  Musculoskeletal: Strength & Muscle Tone: within normal limits Gait & Station: normal Patient leans: N/A  Psychiatric Specialty Exam: Review of Systems  Constitutional: Negative for activity change and fatigue.  HENT: Negative for rhinorrhea and sore throat.   Eyes: Negative for photophobia and visual disturbance.  Respiratory: Negative for cough and shortness of breath.   Cardiovascular: Negative for chest pain and palpitations.  Gastrointestinal: Negative for constipation, diarrhea, nausea and vomiting.  Endocrine: Negative for cold intolerance and heat intolerance.  Genitourinary: Negative for difficulty urinating and dysuria.  Musculoskeletal: Negative for back pain and myalgias.  Skin: Negative for rash and wound.  Allergic/Immunologic: Negative for food allergies and immunocompromised state.  Neurological: Negative for dizziness and headaches.  Hematological: Negative for adenopathy. Does not bruise/bleed easily.  Psychiatric/Behavioral: Negative for dysphoric mood, hallucinations, sleep disturbance and suicidal ideas.    Blood pressure 116/68, pulse 69, temperature 98.4 F (36.9 C), temperature source Oral, resp. rate 17, height 5\' 6"  (1.676 m), weight 62.8 kg, SpO2 100 %, unknown if currently breastfeeding.Body mass index is 22.35 kg/m.  General Appearance: Casual  Eye Contact::  Good  Speech:  Normal Rate  Volume:  Normal  Mood:  Euthymic  Affect:  Congruent  Thought Process:  Coherent and Linear  Orientation:  Full (Time, Place, and Person)  Thought Content:  Logical  Suicidal Thoughts:  No  Homicidal Thoughts:  No  Memory:  Immediate;   Fair Recent;   Fair Remote;    Fair  Judgement:  Fair  Insight:  Fair  Psychomotor Activity:  Normal  Concentration:  Fair  Recall:  [phone] of Knowledge:Fair  Language: Good  Akathisia:  Negative  Handed:  Right  AIMS (if indicated):     Assets:  Communication Skills Desire for Improvement Financial Resources/Insurance Housing Intimacy Physical Health Resilience Social Support Talents/Skills  Sleep:  Number of Hours: 8.25  Cognition: WNL  ADL's:  Intact   Mental Status Per Nursing Assessment::   On Admission:  NA  Demographic Factors:  NA  Loss Factors: NA  Historical Factors: Impulsivity  Risk Reduction Factors:   Pregnancy, Responsible for children under 70 years of age, Sense of responsibility to family, Religious beliefs about death, Living with another person, especially a relative, Positive social support and Positive coping skills or problem solving skills  Continued Clinical Symptoms:  Bipolar Disorder:   Mixed State  Cognitive Features That Contribute To Risk:  Polarized thinking    Suicide Risk:  Minimal: No identifiable suicidal ideation.  Patients presenting with no risk factors but with morbid ruminations; may be classified as minimal risk based on the severity of the depressive symptoms   Follow-up Information    [facility] Follow up.   Why: Your appointment is scheduled for 07/13/2020 at 12:30PM.  Appointment is face to face.  Please bring your prescriptions to the appointment.  Thanks! Contact information: [address] [place] [phone]               Plan Of Care/Follow-up recommendations:  Activity:  as tolerated Diet:  regular diet  [phone], MD 07/08/2020, 8:22 AM

## 2020-07-07 NOTE — Progress Notes (Signed)
BRIEF PHARMACY NOTE   This patient attended and participated in Medication Management Group counseling led by [facility] staff pharmacist.  This interactive class reviews basic information about prescription medications and education on personal responsibility in medication management.  The class also includes general knowledge of 3 main classes of behavioral medications, including antipsychotics, antidepressants, and mood stabilizers.     Patient behavior was appropriate for group setting.   Alyssa Mathis was an active participant in group and expressed concern of utilizing Zyprexa during pregnancy.   Educational materials sourced from:  "Medication Do's and Don'ts" from [email or web address]   "Mental Health Medications" from [facility] FaxRack.[email or web address].shtml#[email or web address]    Albina Billet, PharmD, BCPS Clinical Pharmacist 07/07/2020 3:16 PM

## 2020-07-07 NOTE — Plan of Care (Signed)
D- Patient alert and oriented. Patient presents in a more pleasant mood, than previous days, on assessment stating that she always sleeps good. Patient had no complaints to voice to this Clinical research associate. Patient denies SI, HI, AVH, and pain at this time. Patient also denies any signs/symptoms of depression and anxiety, stating "girl, naw". Patient reports that overall, "I feel great, just like the cereal". Patient's goal for today is "looking for a place to be at when I get out of here", in which "staying positive", will help her achieve her goal.  A- Scheduled medications administered to patient, per MD orders. Support and encouragement provided.  Routine safety checks conducted every 15 minutes.  Patient informed to notify staff with problems or concerns.  R- No adverse drug reactions noted. Patient contracts for safety at this time. Patient compliant with medications and treatment plan. Patient receptive, calm, and cooperative. Patient interacts well with others on the unit.  Patient remains safe at this time.  Problem: Education: Goal: Knowledge of [place] [facility] Education information/materials will improve Outcome: Progressing Goal: Emotional status will improve Outcome: Progressing Goal: Mental status will improve Outcome: Progressing Goal: Verbalization of understanding the information provided will improve Outcome: Progressing   Problem: Activity: Goal: Interest or engagement in activities will improve Outcome: Progressing Goal: Sleeping patterns will improve Outcome: Progressing   Problem: Coping: Goal: Ability to verbalize frustrations and anger appropriately will improve Outcome: Progressing Goal: Ability to demonstrate self-control will improve Outcome: Progressing   Problem: Health Behavior/Discharge Planning: Goal: Identification of resources available to assist in meeting health care needs will improve Outcome: Progressing Goal: Compliance with treatment plan for underlying  cause of condition will improve Outcome: Progressing   Problem: Physical Regulation: Goal: Ability to maintain clinical measurements within normal limits will improve Outcome: Progressing   Problem: Safety: Goal: Periods of time without injury will increase Outcome: Progressing   Problem: Activity: Goal: Will verbalize the importance of balancing activity with adequate rest periods Outcome: Progressing   Problem: Education: Goal: Will be free of psychotic symptoms Outcome: Progressing Goal: Knowledge of the prescribed therapeutic regimen will improve Outcome: Progressing   Problem: Coping: Goal: Coping ability will improve Outcome: Progressing Goal: Will verbalize feelings Outcome: Progressing   Problem: Health Behavior/Discharge Planning: Goal: Compliance with prescribed medication regimen will improve Outcome: Progressing   Problem: Nutritional: Goal: Ability to achieve adequate nutritional intake will improve Outcome: Progressing   Problem: Role Relationship: Goal: Ability to communicate needs accurately will improve Outcome: Progressing Goal: Ability to interact with others will improve Outcome: Progressing   Problem: Safety: Goal: Ability to redirect hostility and anger into socially appropriate behaviors will improve Outcome: Progressing Goal: Ability to remain free from injury will improve Outcome: Progressing   Problem: Self-Care: Goal: Ability to participate in self-care as condition permits will improve Outcome: Progressing   Problem: Self-Concept: Goal: Will verbalize positive feelings about self Outcome: Progressing

## 2020-07-07 NOTE — Progress Notes (Signed)
Patient asked to put her phone on the charger, so that it would be charged for tomorrow when she discharges.

## 2020-07-07 NOTE — Progress Notes (Signed)
[facility] MD Progress Note  07/07/2020 11:10 AM Alyssa Mathis  MRN:  [id number]   Subjective:  Alyssa Mathis seen at bedside this morning. She is much more pleasant today, and cooperative with interview. She has been cooperative with staff, and far less demanding. Today she expresses concern about discharge planning and the safety of her and her child. Will continue to work on safe discharge planning.   Principal Problem: Schizoaffective disorder, bipolar type (HCC) Diagnosis: Principal Problem:   Schizoaffective disorder, bipolar type (HCC) Active Problems:   Poor compliance   Pregnant   Hypertension  Total Time spent with patient: 30 minutes  Past Psychiatric History: History of schizoaffective disorder, bipolar type. She was hospitalized most recently in May 2021, and stabalized on Risperdal 4 mg QHS. She has failed monotherapy with depakote, trilleptal, and Abilify in the past. She had a dystonic reaction to [place]. She was scheduled to follow-up with [facility] outpatient, but did not attend appointments. She was also hospitalized at [facility] December of 2020 for 21 days   Past Medical History:  Past Medical History:  Diagnosis Date  . Anemia   . Hypertension   . Pregnancy induced hypertension     Past Surgical History:  Procedure Laterality Date  . NO PAST SURGERIES     Family History:  Family History  Problem Relation Age of Onset  . Hypertension Sister   . Hypertension Maternal Grandmother   . Diabetes Maternal Grandfather   . Hypertension Maternal Grandfather    Family Psychiatric  History: Denies Social History:  Social History   Substance and Sexual Activity  Alcohol Use Never     Social History   Substance and Sexual Activity  Drug Use Not on file    Social History   Socioeconomic History  . Marital status: Single    Spouse name: Not on file  . Number of children: Not on file  . Years of education: Not on file  . Highest education level: Not on file   Occupational History  . Not on file  Tobacco Use  . Smoking status: Never Smoker  . Smokeless tobacco: Never Used  Substance and Sexual Activity  . Alcohol use: Never  . Drug use: Not on file  . Sexual activity: Yes  Other Topics Concern  . Not on file  Social History Narrative  . Not on file   Social Determinants of Health   Financial Resource Strain:   . Difficulty of Paying Living Expenses: Not on file  Food Insecurity:   . Worried About Programme researcher, broadcasting/film/video in the Last Year: Not on file  . Ran Out of Food in the Last Year: Not on file  Transportation Needs:   . Lack of Transportation (Medical): Not on file  . Lack of Transportation (Non-Medical): Not on file  Physical Activity:   . Days of Exercise per Week: Not on file  . Minutes of Exercise per Session: Not on file  Stress:   . Feeling of Stress : Not on file  Social Connections:   . Frequency of Communication with Friends and Family: Not on file  . Frequency of Social Gatherings with Friends and Family: Not on file  . Attends Religious Services: Not on file  . Active Member of Clubs or Organizations: Not on file  . Attends Banker Meetings: Not on file  . Marital Status: Not on file   Additional Social History:       Sleep: Good  Appetite:  Good  Current Medications: Current Facility-Administered Medications  Medication Dose Route Frequency Provider Last Rate Last Admin  . acetaminophen (TYLENOL) tablet 650 mg  650 mg Oral Q6H PRN [name], [name], MD      . alum & mag hydroxide-simeth (MAALOX/MYLANTA) 200-200-20 MG/5ML suspension 30 mL  30 mL Oral Q4H PRN [name], [name], MD   30 mL at 07/04/20 0644  . amLODipine (NORVASC) tablet 5 mg  5 mg Oral Q breakfast [name], [name], MD   5 mg at 07/07/20 0807  . aspirin chewable tablet 81 mg  81 mg Oral Daily [name], [name], MD   81 mg at 07/07/20 0807  . feeding supplement (ENSURE ENLIVE) (ENSURE ENLIVE) liquid 237 mL  237 mL Oral TID BM [name], MD   237 mL at 07/06/20 2118  . ferrous sulfate tablet 325 mg  325 mg Oral Q breakfast [name], MD   325 mg at 07/07/20 4098  . labetalol (NORMODYNE) tablet 200 mg  200 mg Oral BID [name], MD   200 mg at 07/07/20 1191  . magnesium hydroxide (MILK OF MAGNESIA) suspension 30 mL  30 mL Oral Daily PRN [name], [name], MD      . metoCLOPramide (REGLAN) tablet 10 mg  10 mg Oral Q8H PRN [name], [name], MD      . OLANZapine (ZYPREXA) tablet 5 mg  5 mg Oral BID [name], MD   5 mg at 07/07/20 4782   Or  . OLANZapine (ZYPREXA) injection 5 mg  5 mg Intramuscular BID [name], MD      . OLANZapine [facility]) tablet 2.5 mg  2.5 mg Oral Q6H PRN [name], MD      . prenatal vitamin w/FE, FA (PRENATAL 1 + 1) 27-1 MG tablet 1 tablet  1 tablet Oral q AM [name], MD   1 tablet at 07/07/20 9562    Lab Results: No results found for this or any previous visit (from the past 48 hour(s)).  Blood Alcohol level:  Lab Results  Component Value Date   ETH <10 06/27/2020    Metabolic Disorder Labs: Lab Results  Component Value Date   HGBA1C 4.8 06/27/2020   MPG 91 06/27/2020   No results found for: PROLACTIN Lab Results  Component Value Date   CHOL 182 06/27/2020   TRIG 61 06/27/2020   HDL 88 06/27/2020   CHOLHDL 2.1 06/27/2020   VLDL 12 06/27/2020   LDLCALC 82 06/27/2020    Physical Findings: AIMS: Facial and Oral Movements Muscles of Facial Expression: None, normal Lips and Perioral Area: None, normal Jaw: None, normal Tongue: None, normal,Extremity Movements Upper (arms, wrists, hands, fingers): None, normal Lower (legs, knees, ankles, toes): None, normal, Trunk Movements Neck, shoulders, hips: None, normal, Overall Severity Severity of abnormal movements (highest score from questions above): None, normal Incapacitation due to abnormal movements: None, normal Patient's awareness of abnormal movements (rate only patient's report): No  Awareness, Dental Status Current problems with teeth and/or dentures?: No Does patient usually wear dentures?: No  CIWA:    COWS:     Musculoskeletal: Strength & Muscle Tone: within normal limits Gait & Station: normal Patient leans: N/A  Psychiatric Specialty Exam: Physical Exam Constitutional:      Appearance: Normal appearance.  HENT:     Head: Normocephalic and atraumatic.     Right Ear: External ear normal.     Left Ear: External ear normal.  Nose: Nose normal.     Mouth/Throat:     Mouth: Mucous membranes are moist.     Pharynx: Oropharynx is clear.  Eyes:     Extraocular Movements: Extraocular movements intact.     Conjunctiva/sclera: Conjunctivae normal.     Pupils: Pupils are equal, round, and reactive to light.  Cardiovascular:     Rate and Rhythm: Normal rate.     Pulses: Normal pulses.  Pulmonary:     Effort: Pulmonary effort is normal.     Breath sounds: Normal breath sounds.  Abdominal:     General: Abdomen is flat.     Palpations: Abdomen is soft.  Musculoskeletal:        General: No swelling. Normal range of motion.     Cervical back: Normal range of motion and neck supple.  Skin:    General: Skin is warm and dry.  Neurological:     General: No focal deficit present.     Mental Status: She is alert and oriented to person, place, and time.  Psychiatric:        Attention and Perception: Attention normal.        Mood and Affect: Mood normal.        Speech: Speech normal.        Behavior: Behavior is cooperative.        Thought Content: Thought content is paranoid.        Cognition and Memory: Cognition and memory normal.        Judgment: Judgment is impulsive.     Review of Systems  Constitutional: Positive for activity change. Negative for fatigue.  HENT: Negative for rhinorrhea and sore throat.   Eyes: Negative for photophobia and visual disturbance.  Respiratory: Negative for cough and shortness of breath.   Cardiovascular: Negative for  chest pain and palpitations.  Gastrointestinal: Negative for constipation, diarrhea, nausea and vomiting.  Endocrine: Negative for cold intolerance and heat intolerance.  Genitourinary: Negative for difficulty urinating and dysuria.  Musculoskeletal: Negative for back pain and myalgias.  Skin: Negative for rash and wound.  Allergic/Immunologic: Negative for environmental allergies and food allergies.  Neurological: Negative for dizziness and headaches.  Hematological: Negative for adenopathy. Does not bruise/bleed easily.  Psychiatric/Behavioral: Negative for hallucinations and suicidal ideas.    Blood pressure 130/87, pulse 63, temperature 98.4 F (36.9 C), temperature source Oral, resp. rate 17, height 5\' 6"  (1.676 m), weight 62.8 kg, SpO2 100 %, unknown if currently breastfeeding.Body mass index is 22.35 kg/m.  General Appearance: Casual  Eye Contact:  Good  Speech:  Normal Rate  Volume:  Normal  Mood:  Irritable  Affect:  Congruent  Thought Process:  Coherent  Orientation:  Full (Time, Place, and Person)  Thought Content:  Logical  Suicidal Thoughts:  No  Homicidal Thoughts:  No  Memory:  Immediate;   Fair Recent;   Fair Remote;   Fair  Judgement:  Intact  Insight:  Lacking  Psychomotor Activity:  Normal  Concentration:  Concentration: Fair and Attention Span: Fair  Recall:  FiservFair  Fund of Knowledge:  Fair  Language:  Fair  Akathisia:  Negative  Handed:  Right  AIMS (if indicated):     Assets:  Communication Skills Desire for Improvement Resilience Social Support  ADL's:  Intact  Cognition:  WNL  Sleep:  Number of Hours: 8.25     Treatment Plan Summary: Daily contact with patient to assess and evaluate symptoms and progress in treatment, Medication management and Plan continue  medications as above. Will begin looking into disposition options now that Ms. Hay psychiatric condition is starting to improve.   [name], MD 07/07/2020, 11:10 AM

## 2020-07-08 NOTE — Progress Notes (Signed)
  [facility] Adult Case Management Discharge Plan :  Will you be returning to the same living situation after discharge:  Yes,  pt reports that she is returning home. At discharge, do you have transportation home?: Yes,  pt reports that she has a peer that will provide transportation. Do you have the ability to pay for your medications: Yes,  [place] [employer].  Release of information consent forms completed and in the chart;  Patient's signature needed at discharge.  Patient to Follow up at:  Follow-up Information    [facility] Follow up.   Why: Your appointment is scheduled for 07/13/2020 at 12:30PM.  Appointment is face to face.  Please bring your prescriptions to the appointment.  Thanks! Contact information: [address] [place] [address] [phone]               Next level of care provider has access to [facility] Link:no  Safety Planning and Suicide Prevention discussed: Yes,  SPE completed with the patient and collateral.     Has patient been referred to the Quitline?: Patient refused referral  Patient has been referred for addiction treatment: Pt. refused referral  Harden Mo, LCSW 07/08/2020, 9:59 AM

## 2020-07-08 NOTE — Progress Notes (Signed)
D- Patient alert and oriented. Patient presents in a pleasant mood on assessment stating that she slept good last night and had no complaints to voice to this Clinical research associate. Patient denies SI, HI, AVH, and pain at this time. Patient also denies any signs/symptoms of depression and anxiety. Patient had no stated goals for today.  A- Scheduled medications administered to patient, per MD orders. Support and encouragement provided.  Routine safety checks conducted every 15 minutes.  Patient informed to notify staff with problems or concerns.  R- No adverse drug reactions noted. Patient contracts for safety at this time. Patient compliant with medications and treatment plan. Patient receptive, calm, and cooperative. Patient interacts well with others on the unit.  Patient remains safe at this time.

## 2020-07-08 NOTE — Progress Notes (Signed)
Patient alert and oriented x 4, affect is blunted, thoughts are organized and coherent she is receptive to staff, she refused evening medication, noted interacting appropriately with peers and staff, she denies SI/HI/AVH, 15 minutes safety checks maintained will continue to monitor.

## 2020-07-08 NOTE — Progress Notes (Signed)
Recreation Therapy Notes  INPATIENT RECREATION TR PLAN  Patient Details Name: Zen Felling MRN: [id number] DOB: 12-14-1985 Today's Date: 07/08/2020  Rec Therapy Plan Is patient appropriate for Therapeutic Recreation?: Yes Treatment times per week: at least 3 Estimated Length of Stay: 5-7 days TR Treatment/Interventions: Group participation (Comment)  Discharge Criteria Pt will be discharged from therapy if:: Discharged Treatment plan/goals/alternatives discussed and agreed upon by:: Patient/family  Discharge Summary Short term goals set: Patient will engage in groups with a calm and appropriate mood at least 2x within 5 recreation therapy group sessions Short term goals met: Not met Progress toward goals comments: Groups attended Which groups?: Coping skills Reason goals not met: Patient spent most of her time in her room Therapeutic equipment acquired: N/A Reason patient discharged from therapy: Discharge from hospital Pt/family agrees with progress & goals achieved: Yes Date patient discharged from therapy: 07/08/20   Belita Warsame 07/08/2020, 3:07 PM

## 2020-07-08 NOTE — Progress Notes (Signed)
Discharge Note:  Patient denies SI/HI/AVH at this time. Discharge instructions, AVS, prescriptions and transition record gone over with patient. Patient agrees to comply with medication management, follow-up visit, and outpatient therapy. Patient belongings returned to patient. Patient questions and concerns addressed and answered. Patient ambulatory off unit. Patient discharged to home with her friend.

## 2020-07-08 NOTE — Plan of Care (Signed)
  Problem: Group Participation Goal: STG - Patient will engage in groups with a calm and appropriate mood at least 2x within 5 recreation therapy group sessions Description: STG - Patient will engage in groups with a calm and appropriate mood at least 2x within 5 recreation therapy group sessions 07/08/2020 1505 by Ernest Haber, LRT Outcome: Not Applicable [id number]/63/[id number] by Ernest Haber, LRT Outcome: Not Met (add Reason) Note: Patient spent most of her time in her room

## 2020-08-03 DIAGNOSIS — Z8759 Personal history of other complications of pregnancy, childbirth and the puerperium: Secondary | ICD-10-CM | POA: Insufficient documentation

## 2020-08-08 DIAGNOSIS — Z8751 Personal history of pre-term labor: Secondary | ICD-10-CM | POA: Insufficient documentation

## 2020-08-31 DIAGNOSIS — B009 Herpesviral infection, unspecified: Secondary | ICD-10-CM | POA: Insufficient documentation

## 2020-09-17 NOTE — L&D Delivery Note (Addendum)
[facility]N [facility] Delivery Note  Alyssa Mathis is a 35 y.o. [id number] s/p VD at [redacted]w[redacted]d. She was admitted for IOL in the s/o cHTN with superimposed preeclampsia  ROM: 1h 6m with clear fluid GBS Status: negative Maximum Maternal Temperature: 98.1 F  Labor Progress: Augmentation with pitocin, AROM.   Delivery Date/Time: 2345 on 12/17/20 Delivery: Called to room and patient was complete and pushing. Head delivered ROA. No nuchal cord present. Shoulder and body delivered in usual fashion. Infant with spontaneous cry, placed on mother's abdomen, dried and stimulated. Cord clamped x 2 after 1-minute delay, and cut by MOB under my direct supervision. Cord blood drawn. Placenta delivered spontaneously with gentle cord traction. Fundus firm with massage and Pitocin. Labia, perineum, vagina, and cervix were inspected, lacerations noted below.   Placenta: Intact, 3 vessel cord Complications: none Lacerations: 1st degree hemostatic perineal   EBL: 100 mL Analgesia: None    Postpartum Planning #cHTN with superimposed preeclampsia:  - Discontinue home labetalol - Start Norvasc 10 mg  Infant: Girl  APGARs 8 and 9  TBD g  Henrietta Hoover MD, PGY1    Attestation:  I confirm that I have verified the information documented in the resident's note and that I have also personally reperformed the physical exam and all medical decision making activities.   I was gloved and present for entire delivery SVD without incident No difficulty with shoulders Lacerations as listed above Repair of same not indicated, hemostatic and superficial Will transfer to [facility] for 24 hrs of MgSO4 MD to follow Alyssa Mathis, CNM

## 2020-12-04 ENCOUNTER — Encounter (HOSPITAL_COMMUNITY): Payer: Self-pay | Admitting: Obstetrics & Gynecology

## 2020-12-04 ENCOUNTER — Other Ambulatory Visit: Payer: Self-pay

## 2020-12-04 ENCOUNTER — Inpatient Hospital Stay (HOSPITAL_COMMUNITY)
Admission: AD | Admit: 2020-12-04 | Discharge: 2020-12-05 | Disposition: A | Discharge status: Home / Self Care | Payer: Medicaid Other | Attending: Obstetrics & Gynecology | Admitting: Obstetrics & Gynecology

## 2020-12-04 DIAGNOSIS — Z8759 Personal history of other complications of pregnancy, childbirth and the puerperium: Secondary | ICD-10-CM | POA: Insufficient documentation

## 2020-12-04 DIAGNOSIS — O98513 Other viral diseases complicating pregnancy, third trimester: Secondary | ICD-10-CM | POA: Insufficient documentation

## 2020-12-04 DIAGNOSIS — Z87891 Personal history of nicotine dependence: Secondary | ICD-10-CM | POA: Insufficient documentation

## 2020-12-04 DIAGNOSIS — Z3689 Encounter for other specified antenatal screening: Secondary | ICD-10-CM | POA: Diagnosis not present

## 2020-12-04 DIAGNOSIS — O10919 Unspecified pre-existing hypertension complicating pregnancy, unspecified trimester: Secondary | ICD-10-CM

## 2020-12-04 DIAGNOSIS — Z3A36 36 weeks gestation of pregnancy: Secondary | ICD-10-CM | POA: Insufficient documentation

## 2020-12-04 DIAGNOSIS — Z79899 Other long term (current) drug therapy: Secondary | ICD-10-CM | POA: Insufficient documentation

## 2020-12-04 DIAGNOSIS — O09293 Supervision of pregnancy with other poor reproductive or obstetric history, third trimester: Secondary | ICD-10-CM | POA: Insufficient documentation

## 2020-12-04 DIAGNOSIS — B009 Herpesviral infection, unspecified: Secondary | ICD-10-CM | POA: Diagnosis not present

## 2020-12-04 DIAGNOSIS — Z8249 Family history of ischemic heart disease and other diseases of the circulatory system: Secondary | ICD-10-CM | POA: Insufficient documentation

## 2020-12-04 DIAGNOSIS — Z7982 Long term (current) use of aspirin: Secondary | ICD-10-CM | POA: Diagnosis not present

## 2020-12-04 DIAGNOSIS — O10913 Unspecified pre-existing hypertension complicating pregnancy, third trimester: Secondary | ICD-10-CM | POA: Diagnosis not present

## 2020-12-04 NOTE — MAU Note (Signed)
Alyssa Mathis is a 35 y.o. at Unknown here in MAU reporting: contractions "that just started". Denies SROM, vaginal bleeding or bloody show. Endorses + fetal movement

## 2020-12-04 NOTE — MAU Note (Signed)
During cervical exam object noted in vagina-pt asked if she was aware of anything that has been inserted in her vagina-pt stated she inserted a tampon a "couple of days ago" due to increased vaginal discharge. Second pelvic exam performed and tampon was removed.

## 2020-12-05 ENCOUNTER — Inpatient Hospital Stay (HOSPITAL_BASED_OUTPATIENT_CLINIC_OR_DEPARTMENT_OTHER): Payer: Medicaid Other

## 2020-12-05 DIAGNOSIS — O10013 Pre-existing essential hypertension complicating pregnancy, third trimester: Secondary | ICD-10-CM

## 2020-12-05 DIAGNOSIS — Z3A36 36 weeks gestation of pregnancy: Secondary | ICD-10-CM

## 2020-12-05 DIAGNOSIS — O09293 Supervision of pregnancy with other poor reproductive or obstetric history, third trimester: Secondary | ICD-10-CM | POA: Diagnosis not present

## 2020-12-05 DIAGNOSIS — O10913 Unspecified pre-existing hypertension complicating pregnancy, third trimester: Secondary | ICD-10-CM

## 2020-12-05 DIAGNOSIS — B009 Herpesviral infection, unspecified: Secondary | ICD-10-CM

## 2020-12-05 DIAGNOSIS — O98513 Other viral diseases complicating pregnancy, third trimester: Secondary | ICD-10-CM

## 2020-12-05 LAB — COMPREHENSIVE METABOLIC PANEL
ALT: 23 U/L (ref 0–44)
AST: 26 U/L (ref 15–41)
Albumin: 2.7 g/dL — ABNORMAL LOW (ref 3.5–5.0)
Alkaline Phosphatase: 93 U/L (ref 38–126)
Anion gap: 6 (ref 5–15)
BUN: 8 mg/dL (ref 6–20)
CO2: 24 mmol/L (ref 22–32)
Calcium: 9 mg/dL (ref 8.9–10.3)
Chloride: 108 mmol/L (ref 98–111)
Creatinine, Ser: 0.69 mg/dL (ref 0.44–1.00)
GFR, Estimated: >60 mL/min (ref >60)
Glucose, Bld: 99 mg/dL (ref 70–99)
Potassium: 3.6 mmol/L (ref 3.5–5.1)
Sodium: 138 mmol/L (ref 135–145)
Total Bilirubin: 0.8 mg/dL (ref 0.3–1.2)
Total Protein: 6.1 g/dL — ABNORMAL LOW (ref 6.5–8.1)

## 2020-12-05 LAB — CBC
HCT: 30.7 % — ABNORMAL LOW (ref 36.0–46.0)
Hemoglobin: 10.2 g/dL — ABNORMAL LOW (ref 12.0–15.0)
MCH: 31.3 pg (ref 26.0–34.0)
MCHC: 33.2 g/dL (ref 30.0–36.0)
MCV: 94.2 fL (ref 80.0–100.0)
Platelets: 206 10*3/uL (ref 150–400)
RBC: 3.26 MIL/uL — ABNORMAL LOW (ref 3.87–5.11)
RDW: 12.6 % (ref 11.5–15.5)
WBC: 6.2 10*3/uL (ref 4.0–10.5)
nRBC: 0 % (ref 0.0–0.2)

## 2020-12-05 LAB — GC/CHLAMYDIA PROBE AMP (~~LOC~~) NOT AT ARMC
Chlamydia: NEGATIVE
Comment: NEGATIVE
Comment: NORMAL
Neisseria Gonorrhea: NEGATIVE

## 2020-12-05 LAB — WET PREP, GENITAL
Clue Cells Wet Prep HPF POC: NONE SEEN
Sperm: NONE SEEN
Trich, Wet Prep: NONE SEEN
Yeast Wet Prep HPF POC: NONE SEEN

## 2020-12-05 LAB — PROTEIN / CREATININE RATIO, URINE
Creatinine, Urine: 220.79 mg/dL
Protein Creatinine Ratio: 0.18 mg/mg{Cre} — ABNORMAL HIGH (ref 0.00–0.15)
Total Protein, Urine: 40 mg/dL

## 2020-12-05 LAB — OB RESULTS CONSOLE GBS: GBS: NEGATIVE

## 2020-12-05 MED ORDER — VALACYCLOVIR HCL 500 MG PO TABS: 500.0000 mg | ORAL_TABLET | Freq: Two times a day (BID) | ORAL | 1 refills | Status: DC

## 2020-12-05 NOTE — Discharge Instructions (Signed)
Signs and Symptoms of Labor Labor is the body's natural process of moving the baby and the placenta out of the uterus. The process of labor usually starts when the baby is full-term, between 37 and 40 weeks of pregnancy. Signs and symptoms that you are close to going into labor As your body prepares for labor and the birth of your baby, you may notice the following symptoms in the weeks and days before true labor starts:  Passing a small amount of thick, bloody mucus from your vagina. This is called normal bloody show or losing your mucus plug. This may happen more than a week before labor begins, or right before labor begins, as the opening of the cervix starts to widen (dilate). For some women, the entire mucus plug passes at once. For others, pieces of the mucus plug may gradually pass over several days.  Your baby moving (dropping) lower in your pelvis to get into position for birth (lightening). When this happens, you may feel more pressure on your bladder and pelvic bone and less pressure on your ribs. This may make it easier to breathe. It may also cause you to need to urinate more often and have problems with bowel movements.  Having "practice contractions," also called Braxton Hicks contractions or false labor. These occur at irregular (unevenly spaced) intervals that are more than 10 minutes apart. False labor contractions are common after exercise or sexual activity. They will stop if you change position, rest, or drink fluids. These contractions are usually mild and do not get stronger over time. They may feel like: ? A backache or back pain. ? Mild cramps, similar to menstrual cramps. ? Tightening or pressure in your abdomen. Other early symptoms include:  Nausea or loss of appetite.  Diarrhea.  Having a sudden burst of energy, or feeling very tired.  Mood changes.  Having trouble sleeping.   Signs and symptoms that labor has begun Signs that you are in labor may  include:  Having contractions that come at regular (evenly spaced) intervals and increase in intensity. This may feel like more intense tightening or pressure in your abdomen that moves to your back. ? Contractions may also feel like rhythmic pain in your upper thighs or back that comes and goes at regular intervals. ? For first-time mothers, this change in intensity of contractions often occurs at a more gradual pace. ? Women who have given birth before may notice a more rapid progression of contraction changes.  Feeling pressure in the vaginal area.  Your water breaking (rupture of membranes). This is when the sac of fluid that surrounds your baby breaks. Fluid leaking from your vagina may be clear or blood-tinged. Labor usually starts within 24 hours of your water breaking, but it may take longer to begin. ? Some women may feel a sudden gush of fluid. ? Others notice that their underwear repeatedly becomes damp. Follow these instructions at home:  When labor starts, or if your water breaks, call your health care provider or nurse care line. Based on your situation, they will determine when you should go in for an exam.  During early labor, you may be able to rest and manage symptoms at home. Some strategies to try at home include: ? Breathing and relaxation techniques. ? Taking a warm bath or shower. ? Listening to music. ? Using a heating pad on the lower back for pain. If you are directed to use heat:  Place a towel between your skin and the   heat source.  Leave the heat on for 20-30 minutes.  Remove the heat if your skin turns bright red. This is especially important if you are unable to feel pain, heat, or cold. You may have a greater risk of getting burned.   Contact a health care provider if:  Your labor has started.  Your water breaks. Get help right away if:  You have painful, regular contractions that are 5 minutes apart or less.  Labor starts before you are [redacted] weeks  along in your pregnancy.  You have a fever.  You have bright red blood coming from your vagina.  You do not feel your baby moving.  You have a severe headache with or without vision problems.  You have severe nausea, vomiting, or diarrhea.  You have chest pain or shortness of breath. These symptoms may represent a serious problem that is an emergency. Do not wait to see if the symptoms will go away. Get medical help right away. Call your local emergency services (911 in the [place] Do not drive yourself to the hospital. Summary  Labor is your body's natural process of moving your baby and the placenta out of your uterus.  The process of labor usually starts when your baby is full-term, between 37 and 40 weeks of pregnancy.  When labor starts, or if your water breaks, call your health care provider or nurse care line. Based on your situation, they will determine when you should go in for an exam. This information is not intended to replace advice given to you by your health care provider. Make sure you discuss any questions you have with your health care provider. Document Revised: 06/25/2020 Document Reviewed: 06/25/2020 Elsevier Patient Education  2021 [employer]        Preeclampsia and Eclampsia Preeclampsia is a serious condition that may develop during pregnancy. This condition involves high blood pressure during pregnancy and causes symptoms such as headaches, vision changes, and increased swelling in the legs, hands, and face. Preeclampsia occurs after 20 weeks of pregnancy. Eclampsia is a seizure that happens from worsening preeclampsia. Diagnosing and managing preeclampsia early is important. If not treated early, it can cause serious problems for mother and baby. There is no cure for this condition. However, during pregnancy, delivering the baby may be the best treatment for preeclampsia or eclampsia. For most women, symptoms of preeclampsia and eclampsia go away after  giving birth. In rare cases, a woman may develop preeclampsia or eclampsia after giving birth. This usually occurs within 48 hours after childbirth but may occur up to 6 weeks after giving birth. What are the causes? The cause of this condition is not known. What increases the risk? The following factors make you more likely to develop preeclampsia:  Being pregnant for the first time or being pregnant with multiples.  Having had preeclampsia or a condition called hemolysis, elevated liver enzymes, and low platelet count (HELLP)syndrome during a past pregnancy.  Having a family history of preeclampsia.  Being older than age 35.  Being obese.  Becoming pregnant through fertility treatments. Conditions that reduce blood flow or oxygen to your placenta and baby may also increase your risk. These include:  High blood pressure before, during, or immediately following pregnancy.  Kidney disease.  Diabetes.  Blood clotting disorders.  Autoimmune diseases, such as lupus.  Sleep apnea. What are the signs or symptoms? Common symptoms of this condition include:  A severe, throbbing headache that does not go away.  Vision problems, such   as blurred or double vision and light sensitivity.  Pain in the stomach, especially the right upper region.  Pain in the shoulder. Other symptoms that may develop as the condition gets worse include:  Sudden weight gain because of fluid buildup in the body. This causes swelling of the face, hands, legs, and feet.  Severe nausea and vomiting.  Urinating less than usual.  Shortness of breath.  Seizures. How is this diagnosed? Your health care provider will ask you about symptoms and check for signs of preeclampsia during your prenatal visits. You will also have routine tests, including:  Checking your blood pressure.  Urine tests to check for protein.  Blood tests to assess your organ function.  Monitoring your baby's heart  rate.  Ultrasounds to check fetal growth.   How is this treated? You and your health care provider will determine the treatment that is best for you. Treatment may include:  Frequent prenatal visits to check for preeclampsia.  Medicine to lower your blood pressure.  Medicine to prevent seizures.  Low-dose aspirin during your pregnancy.  Staying in the hospital, in severe cases. You will be given medicines to control your blood pressure and the amount of fluids in your body.  Delivering your baby. Work with your health care provider to manage any chronic health conditions, such as diabetes or kidney problems. Also, work with your health care provider to manage weight gain during pregnancy. Follow these instructions at home: Eating and drinking  Drink enough fluid to keep your urine pale yellow.  Avoid caffeine. Caffeine may increase blood pressure and heart rate and lead to dehydration.  Reduce the amount of salt that you eat. Lifestyle  Do not use any products that contain nicotine or tobacco. These products include cigarettes, chewing tobacco, and vaping devices, such as e-cigarettes. If you need help quitting, ask your health care provider.  Do not use alcohol or drugs.  Avoid stress as much as possible.  Rest and get plenty of sleep. General instructions  Take over-the-counter and prescription medicines only as told by your health care provider.  When lying down, lie on your left side. This keeps pressure off your major blood vessels.  When sitting or lying down, raise (elevate) your feet. Try putting pillows underneath your lower legs.  Exercise regularly. Ask your health care provider what kinds of exercise are best for you.  Check your blood pressure as often as recommended by your health care provider.  Keep all prenatal and follow-up visits. This is important.   Contact a health care provider if:  You have symptoms that may need treatment or closer  monitoring. These include: ? Headaches. ? Stomach pain or nausea and vomiting. ? Shoulder pain. ? Vision problems, such as spots in front of your eyes or blurry vision. ? Sudden weight gain or increased swelling in your face, hands, legs, and feet. ? Increased anxiety or feeling of impending doom. ? Signs or symptoms of labor. Get help right away if:  You have any of the following symptoms: ? A seizure. ? Shortness of breath or trouble breathing. ? Trouble speaking or slurred speech. ? Fainting. ? Chest pain. These symptoms may represent a serious problem that is an emergency. Do not wait to see if the symptoms will go away. Get medical help right away. Call your local emergency services (911 in the [place] Do not drive yourself to the hospital. Summary  Preeclampsia is a serious condition that may develop during pregnancy.  Diagnosing and   treating preeclampsia early is very important.  Keep all prenatal and follow-up visits. This is important.  Get help right away if you have a seizure, shortness of breath or trouble breathing, trouble speaking or slurred speech, chest pain, or fainting. This information is not intended to replace advice given to you by your health care provider. Make sure you discuss any questions you have with your health care provider. Document Revised: 05/26/2020 Document Reviewed: 05/26/2020 Elsevier Patient Education  2021 [employer] Inc.                        Safe Medications in Pregnancy    Acne: Benzoyl Peroxide Salicylic Acid  Backache/Headache: Tylenol: 2 regular strength every 4 hours OR              2 Extra strength every 6 hours  Colds/Coughs/Allergies: Benadryl (alcohol free) 25 mg every 6 hours as needed Breath right strips Claritin Cepacol throat lozenges Chloraseptic throat spray Cold-Eeze- up to three times per day Cough drops, alcohol free Flonase (by prescription only) Guaifenesin Mucinex Robitussin DM (plain only, alcohol  free) Saline nasal spray/drops Sudafed (pseudoephedrine) & Actifed ** use only after [redacted] weeks gestation and if you do not have high blood pressure Tylenol Vicks Vaporub Zinc lozenges Zyrtec   Constipation: Colace Ducolax suppositories Fleet enema Glycerin suppositories Metamucil Milk of magnesia Miralax Senokot Smooth move tea  Diarrhea: Kaopectate Imodium A-D  *NO pepto Bismol  Hemorrhoids: Anusol Anusol HC Preparation H Tucks  Indigestion: Tums Maalox Mylanta Zantac  Pepcid  Insomnia: Benadryl (alcohol free) 25mg  every 6 hours as needed Tylenol PM Unisom, no Gelcaps  Leg Cramps: Tums MagGel  Nausea/Vomiting:  Bonine Dramamine Emetrol Ginger extract Sea bands Meclizine  Nausea medication to take during pregnancy:  Unisom (doxylamine succinate 25 mg tablets) Take one tablet daily at bedtime. If symptoms are not adequately controlled, the dose can be increased to a maximum recommended dose of two tablets daily (1/2 tablet in the morning, 1/2 tablet mid-afternoon and one at bedtime). Vitamin B6 100mg  tablets. Take one tablet twice a day (up to 200 mg per day).  Skin Rashes: Aveeno products Benadryl cream or 25mg  every 6 hours as needed Calamine Lotion 1% cortisone cream  Yeast infection: Gyne-lotrimin 7 Monistat 7   **If taking multiple medications, please check labels to avoid duplicating the same active ingredients **take medication as directed on the label ** Do not exceed 4000 mg of tylenol in 24 hours **Do not take medications that contain aspirin or ibuprofen           Pregnancy and Genital Herpes  Genital herpes is an STI (sexually transmitted infection) that is caused by the herpes simplex virus (HSV). HSV can cause an outbreak of itching, blisters, and sores (ulcers) around the genitals and rectum. Even when the outbreak goes away, the virus stays in the body. If you are pregnant, you can pass HSV to your baby. If you become  infected with HSV for the first time while you are pregnant, the virus can cause serious problems for your baby. If you had HSV before your pregnancy, the virus may not affect your baby as seriously. Babies that are infected with HSV are at risk for developing inflammation of the brain (encephalitis), damage to organs, and problems with development. How does this affect me? Your type of delivery may be affected. You may be able to have a vaginal delivery if you have no evidence of an outbreak when you  go into labor. However, your baby may need to be delivered by C-section (cesarean delivery) if you have:  An active, recurrent, or new herpes outbreak at the time of delivery. This is because the virus can pass to your baby through an infected birth canal. This can cause severe problems for your baby.  Any symptoms of infection in the areas around the genitals such as pain, burning, and itching, even if you do not have any ulcers in the birth canal. After delivery, you can breastfeed your baby. The virus will not be present in breast milk. While caring for your baby, you will need to take steps to avoid passing the virus on to your baby. How does this affect my baby? If the virus passes to your baby, it can cause serious problems. The virus can be passed to your baby:  Before delivery. The virus can be passed to your unborn baby through the placenta. This is more likely to happen if you get herpes for the first time in the first 3 months of pregnancy (first trimester). This may cause your baby to have a congenital disability.  During delivery. This is more likely to happen if you become infected for the first time late in your pregnancy.  After delivery. Your baby can get a herpes infection if you touch active ulcers and then touch your baby without washing your hands. The virus is less likely to pass to your baby if you had herpes before you became pregnant. This is because antibodies against the virus  develop over a period of time. These antibodies help to protect the baby. How is this treated? This condition can be treated with medicines during pregnancy that are safe for you and your baby. These medicines can help to reduce symptoms, shorten an outbreak, and prevent another outbreak of the infection. If the infection happened before you became pregnant, you may need to take medicine late in your pregnancy to help to prevent a breakout at the time of delivery. Follow these instructions at home: To avoid passing the virus to your baby:  Wash your hands with soap and water often and before touching your baby.  If you have an outbreak, keep the area clean and covered.  If ulcers are present on your breast, do not breastfeed from the affected breast. Contact a health care provider if:  You have a rash, blisters, or ulcers in the area around your genitals or rectum.  You have burning, itching, or pain in the area around your genitals or rectum.  You have trouble urinating. Summary  Genital herpes is an STI (sexually transmitted infection) that is caused by the herpes simplex virus (HSV). If you are pregnant, you can pass the virus to your baby.  Even when the outbreak goes away, the virus stays in your body.  Genital herpes can be passed to your unborn or newborn baby and cause serious problems.  This condition can be treated with medicines during pregnancy that are safe for you and your baby. Medicines can treat your symptoms, shorten the length of an outbreak, and prevent another outbreak of the infection.  If you have signs or symptoms of a herpes outbreak when you go into labor, your health care provider may recommend a C-section (cesarean delivery) to lower the risk of passing the virus to your baby. This information is not intended to replace advice given to you by your health care provider. Make sure you discuss any questions you have with your health  care provider. Document  Revised: 12/26/2018 Document Reviewed: 12/04/2018 Elsevier Patient Education  2021 [employer].       New Induction of Labor Process for [facility]  In Fall 2020 [facility] changed it's process for scheduling inductions of labor to create more induction slots and to make sure patients get COVID-19 testing in advance. After you have been tested you need to quarantine so that you do not get infected after your test. You should not go anywhere after your test except necessary medical appointments.  You have been scheduled for induction of labor on 12/16/2020. Although you may have a specific time listed on your After Visit Summary or MyChart, we cannot predict when your room will be available. Please disregard this time. A Labor and Delivery staff member will call you on the day that you are scheduled when your room is available. You will need to arrive within one hour of being called. If you do not arrive within this time frame, the next person on the list will be called in and you will move down the list. You may eat a light meal before coming to the hospital. If you go into labor, think your water has broken, experience bright red bleeding or don't feel your baby moving as much as usual before your induction, please call your Ob/Gyn's office or come to Entrance C, Maternity Assessment Unit for evaluation.  Thank you,  [facility] [employer]

## 2020-12-05 NOTE — MAU Note (Signed)
Pt up to bathroom-clean catch UA collected

## 2020-12-05 NOTE — MAU Provider Note (Signed)
History     CSN: [id number]  Arrival date and time: 12/04/20 2218   Event Date/Time   First Provider Initiated Contact with Patient 12/05/20 0128      Chief Complaint  Patient presents with  . Contractions   Alyssa Mathis is a 35 y.o. [id number] at [redacted]w[redacted]d who presents to MAU for preeclampsia evaluation after she came in for a labor evaluation and was incidentally found to have elevated blood pressures. Please see RN note for labor evaluation. Patient states she has preeclampsia this pregnancy, but after a review of her chart, it is determined in consultation with Dr. Macon Large that she has cHTN with a history of preeclampsia. Patient reports she was recently involuntarily committed by her OB/GYN office [facility]) in late February, and has not had prenatal care since and will not be returning to her office at [facility]. Patient reports she did not take her labetalol this evening, as prescribed.  Pt denies HA, blurry vision/seeing spots, N/V, epigastric pain, swelling in face and hands, sudden weight gain. Pt denies chest pain and SOB.  Pt denies constipation, diarrhea, or urinary problems. Pt denies fever, chills, fatigue, sweating or changes in appetite. Pt denies dizziness, light-headedness, weakness.  Pt denies VB, ctx, LOF and reports good FM.  Current pregnancy problems? CHTN, hx PreE, HSV, hx schizoaffective disorder Blood Type? O Positive Allergies? Nifedipine, Paliperidone Current medications? Amlodipine 2.5mg  PO daily, labetalol 200mg  PO BID Current PNC & next appt? None, has not been seen since February   OB History    Gravida  6   Para  3   Term  1   Preterm  2   AB  2   Living  3     SAB  2   IAB      Ectopic      Multiple  0   Live Births  3           Past Medical History:  Diagnosis Date  . Anemia   . Hypertension   . Pregnancy induced hypertension     Past Surgical History:  Procedure Laterality Date  . NO PAST SURGERIES      Family History   Problem Relation Age of Onset  . Hypertension Sister   . Hypertension Maternal Grandmother   . Diabetes Maternal Grandfather   . Hypertension Maternal Grandfather     Social History   Tobacco Use  . Smoking status: Former March  . Smokeless tobacco: Never Used  Substance Use Topics  . Alcohol use: Never  . Drug use: Not Currently    Allergies:  Allergies  Allergen Reactions  . Nifedipine     Other reaction(s): Unknown  . Paliperidone Other (See Comments)    Dystonia    Medications Prior to Admission  Medication Sig Dispense Refill Last Dose  . amLODipine (NORVASC) 5 MG tablet Take 1 tablet (5 mg total) by mouth daily with breakfast. 30 tablet 1 12/04/2020 at Unknown time  . aspirin 81 MG chewable tablet Chew 81 mg by mouth daily.   12/04/2020 at Unknown time  . labetalol (NORMODYNE) 200 MG tablet Take 1 tablet (200 mg total) by mouth 2 (two) times daily. 60 tablet 1 12/04/2020 at Unknown time  . Prenat w/o A-FE-Methfol-FA-DHA (PNV-DHA) 27-0.6-0.4-300 MG CAPS Take 1 capsule by mouth daily.   12/04/2020 at Unknown time  . FEROSUL 325 (65 Fe) MG tablet Take 325 mg by mouth every morning.     12/06/2020 OLANZapine (ZYPREXA) 5 MG tablet Take  1 tablet (5 mg total) by mouth 2 (two) times daily. 60 tablet 1   . promethazine (PHENERGAN) 25 MG tablet Take 25 mg by mouth every 6 (six) hours as needed.       Review of Systems  Constitutional: Negative for chills, diaphoresis, fatigue and fever.  Eyes: Negative for visual disturbance.  Respiratory: Negative for shortness of breath.   Cardiovascular: Negative for chest pain.  Gastrointestinal: Negative for abdominal pain, constipation, diarrhea, nausea and vomiting.  Genitourinary: Negative for dysuria, flank pain, frequency, pelvic pain, urgency, vaginal bleeding and vaginal discharge.  Neurological: Negative for dizziness, weakness, light-headedness and headaches.   Physical Exam   Blood pressure (!) 149/97, pulse 69, temperature 98 F  (36.7 C), temperature source Oral, resp. rate 17, height 5\' 6"  (1.676 m), weight 77.9 kg, last menstrual period 03/25/2020, SpO2 99 %, unknown if currently breastfeeding.  Patient Vitals for the past 24 hrs:  BP Temp Temp src Pulse Resp SpO2 Height Weight  12/05/20 0251 -- -- -- -- -- 99 % -- --  12/05/20 0250 (!) 149/97 -- -- 69 -- -- -- --  12/05/20 0249 (!) 149/97 98 F (36.7 C) Oral 70 17 99 % -- --  12/05/20 0247 (!) 166/94 -- -- 65 -- -- -- --  12/05/20 0201 -- -- -- -- -- 99 % -- --  12/05/20 0157 (!) 144/94 -- -- 62 -- -- -- --  12/05/20 0156 -- -- -- -- -- 99 % -- --  12/05/20 0151 -- -- -- -- -- 99 % -- --  12/05/20 0146 -- -- -- -- -- 100 % -- --  12/05/20 0141 -- -- -- -- -- 100 % -- --  12/05/20 0116 (!) 135/94 -- -- 76 -- -- -- --  12/05/20 0100 137/90 -- -- 79 -- 98 % -- --  12/05/20 0056 (!) 144/96 -- -- 76 -- 98 % -- --  12/05/20 0051 -- -- -- -- -- 99 % -- --  12/05/20 0046 (!) 142/94 -- -- 76 -- 98 % -- --  12/05/20 0040 -- -- -- -- -- 98 % -- --  12/05/20 0036 -- -- -- -- -- 98 % -- --  12/05/20 0020 (!) 147/93 -- -- 86 -- -- -- --  12/05/20 0016 (!) 151/97 -- -- 77 -- 99 % -- --  12/05/20 0011 -- -- -- -- -- 99 % -- --  12/05/20 0006 -- -- -- -- -- 98 % -- --  12/05/20 0001 (!) 137/91 -- -- 68 -- 98 % -- --  12/04/20 2347 (!) 154/91 -- -- 81 -- 99 % -- --  12/04/20 2341 (!) 148/87 -- -- 72 18 99 % -- --  12/04/20 2336 -- -- -- -- -- 99 % -- --  12/04/20 2331 -- -- -- -- -- 99 % -- --  12/04/20 2326 -- -- -- -- -- 98 % -- --  12/04/20 2321 -- -- -- -- -- 99 % -- --  12/04/20 2316 -- -- -- -- -- 98 % -- --  12/04/20 2313 (!) 134/93 -- -- 78 -- -- -- --  12/04/20 2311 -- -- -- -- -- 99 % -- --  12/04/20 2306 -- -- -- -- -- 99 % -- --  12/04/20 2301 -- -- -- -- -- 99 % -- --  12/04/20 2258 (!) 144/89 -- -- 72 -- -- -- --  12/04/20 2257 (!) 144/89 -- -- 72 -- -- -- --  12/04/20  2237 (!) 152/95 98.3 F (36.8 C) Oral 74 17 99 %  (1.676 m) 77.9 kg    Physical Exam Vitals and nursing note reviewed.  Constitutional:      Appearance: Normal appearance.  HENT:     Head: Normocephalic and atraumatic.  Pulmonary:     Effort: Pulmonary effort is normal.  Neurological:     Mental Status: She is alert and oriented to person, place, and time.  Psychiatric:        Mood and Affect: Mood normal.        Behavior: Behavior normal.        Thought Content: Thought content normal.        Judgment: Judgment normal.    Results for orders placed or performed during the hospital encounter of 12/04/20 (from the past 24 hour(s))  CBC     Status: Abnormal   Collection Time: 12/04/20 11:48 PM  Result Value Ref Range   WBC 6.2 4.0 - 10.5 K/uL   RBC 3.26 (L) 3.87 - 5.11 MIL/uL   Hemoglobin 10.2 (L) 12.0 - 15.0 g/dL   HCT 16.1 (L) 09.6 - 04.5 %   MCV 94.2 80.0 - 100.0 fL   MCH 31.3 26.0 - 34.0 pg   MCHC 33.2 30.0 - 36.0 g/dL   RDW 40.9 81.1 - 91.4 %   Platelets 206 150 - 400 K/uL   nRBC 0.0 0.0 - 0.2 %  Comprehensive metabolic panel     Status: Abnormal   Collection Time: 12/04/20 11:48 PM  Result Value Ref Range   Sodium 138 135 - 145 mmol/L   Potassium 3.6 3.5 - 5.1 mmol/L   Chloride 108 98 - 111 mmol/L   CO2 24 22 - 32 mmol/L   Glucose, Bld 99 70 - 99 mg/dL   BUN 8 6 - 20 mg/dL   Creatinine, Ser 7.82 0.44 - 1.00 mg/dL   Calcium 9.0 8.9 - 95.6 mg/dL   Total Protein 6.1 (L) 6.5 - 8.1 g/dL   Albumin 2.7 (L) 3.5 - 5.0 g/dL   AST 26 15 - 41 U/L   ALT 23 0 - 44 U/L   Alkaline Phosphatase 93 38 - 126 U/L   Total Bilirubin 0.8 0.3 - 1.2 mg/dL   GFR, Estimated >21 >30 mL/min   Anion gap 6 5 - 15   No results found.  MAU Course  Procedures  MDM -preeclampsia evaluation without severe range BP in MAU on admission -symptoms include: none -CBC: H/H 10.2/30.7, platelets 206 -CMP: serum creatinine 0.69, AST/ALT 26/23 -PCr: 0.18 -EFM: reactive       -baseline: 135/130       -variability: moderate       -accels: present, 15x15        -decels: absent (present at bedside with [name] with suspected decel, FHR   133 on [name] at this time)       -TOCO: irregular -consulted with Dr. Macon Large given patient hx of cHTN without PNC in the past month. Per Dr. Macon Large, patient does not need to have NOB visit at office, will perform BPP here in MAU today, then arrange for BPP and anatomy scan with MFM in one week, with scheduled induction at 38 weeks for cHTN. Will also collect GBS/WetPrep/GC/CT here in MAU. Other OB labs visible in Care Everywhere. -BPP 6/8, tech reports 2 off for breathing, which was seen, but not for time necessary -called Dr. Macon Large regarding BPP score and single severe range pressure that RN states  was taken at time patient reports being upset about previous involuntary commitment, per Dr. Macon Large, pt still OK to be discharged home -called L&D charge nurse and scheduled induction for 12/16/2020, induction orders entered -pt discharged to home in stable condition  Orders Placed This Encounter  Procedures  . Culture, beta strep (group b only)    Standing Status:   Standing    Number of Occurrences:   1  . Wet prep, genital    Standing Status:   Standing    Number of Occurrences:   1  . [name] MFM FETAL BPP WO NON STRESS    Standing Status:   Standing    Number of Occurrences:   1    Order Specific Question:   Symptom/Reason for Exam    Answer:   Chronic hypertension affecting pregnancy [[id number]]  . [name] MFM FETAL BPP W/NONSTRESS    Standing Status:   Future    Standing Expiration Date:   12/05/2021    Order Specific Question:   Reason for Exam (SYMPTOM  OR DIAGNOSIS REQUIRED)    Answer:   chronic HTN, insufficient prenatal care    Order Specific Question:   Preferred Location    Answer:   [facility] Ultrasound  . [name] MFM OB DETAIL +14 WK    Standing Status:   Future    Standing Expiration Date:   12/05/2021    Order Specific Question:   Reason for Exam (SYMPTOM  OR DIAGNOSIS REQUIRED)    Answer:   growth/anatomy cHTN,  insufficient prenatal care    Order Specific Question:   Preferred Location    Answer:   [facility] Ultrasound  . CBC    Standing Status:   Standing    Number of Occurrences:   1  . Comprehensive metabolic panel    Standing Status:   Standing    Number of Occurrences:   1  . Protein / creatinine ratio, urine    Standing Status:   Standing    Number of Occurrences:   1  . Discharge patient    Order Specific Question:   Discharge disposition    Answer:   01-Home or Self Care [1]    Order Specific Question:   Discharge patient date    Answer:   12/05/2020   Meds ordered this encounter  Medications  . valACYclovir (VALTREX) 500 MG tablet    Sig: Take 1 tablet (500 mg total) by mouth 2 (two) times daily.    Dispense:  60 tablet    Refill:  1    Order Specific Question:   Supervising Provider    Answer:   Jaynie Collins A [[id number]]   Assessment and Plan   1. Chronic hypertension affecting pregnancy   2. [redacted] weeks gestation of pregnancy   3. Hx of preeclampsia, prior pregnancy, currently pregnant, third trimester   4. NST (non-stress test) reactive   5. Herpes simplex    Allergies as of 12/05/2020      Reactions   Nifedipine    Other reaction(s): Unknown   Paliperidone Other (See Comments)   Dystonia      Medication List    TAKE these medications   amLODipine 5 MG tablet Commonly known as: NORVASC Take 1 tablet (5 mg total) by mouth daily with breakfast.   aspirin 81 MG chewable tablet Chew 81 mg by mouth daily.   FeroSul 325 (65 FE) MG tablet Generic drug: ferrous sulfate Take 325 mg by mouth every morning.   labetalol  200 MG tablet Commonly known as: NORMODYNE Take 1 tablet (200 mg total) by mouth 2 (two) times daily.   OLANZapine 5 MG tablet Commonly known as: ZYPREXA Take 1 tablet (5 mg total) by mouth 2 (two) times daily.   PNV-DHA 27-0.6-0.4-300 MG Caps Take 1 capsule by mouth daily.   promethazine 25 MG tablet Commonly known as: PHENERGAN Take 25 mg by  mouth every 6 (six) hours as needed.   valACYclovir 500 MG tablet Commonly known as: Valtrex Take 1 tablet (500 mg total) by mouth 2 (two) times daily.       -MFM growth/BPP ordered in one week -RX valacyclovir -pt does not have BP cuff at home. She should come to MAU if she has any of the following:  -headache not relieved with tylenol, rest, hydration -blurry vision, floating spots in her vision -sudden full-body edema or facial edema -RUQ pain that is constant. -chest pain or shortness of breath -new onset or sudden worsening of nausea and vomiting These symptoms may indicate that her blood pressure is worsening and she may be developing gestational hypertension or pre-eclampsia, which is an emergency.  -induction scheduled at 38 weeks, instructions given -return MAU precautions given -pt discharged to home in stable condition  Joni Reining E Angeni Chaudhuri 12/05/2020, 2:53 AM

## 2020-12-06 ENCOUNTER — Telehealth (HOSPITAL_COMMUNITY): Payer: Self-pay | Admitting: *Deleted

## 2020-12-06 ENCOUNTER — Encounter (HOSPITAL_COMMUNITY): Payer: Self-pay | Admitting: *Deleted

## 2020-12-06 NOTE — Telephone Encounter (Signed)
Preadmission screen  

## 2020-12-07 ENCOUNTER — Other Ambulatory Visit: Payer: Self-pay | Admitting: Advanced Practice Midwife

## 2020-12-07 ENCOUNTER — Other Ambulatory Visit (HOSPITAL_COMMUNITY)
Admission: RE | Admit: 2020-12-07 | Discharge: 2020-12-07 | Disposition: A | Discharge status: Home / Self Care | Payer: Medicaid Other | Source: Ambulatory Visit | Attending: Obstetrics and Gynecology | Admitting: Obstetrics and Gynecology

## 2020-12-07 DIAGNOSIS — Z20822 Contact with and (suspected) exposure to covid-19: Secondary | ICD-10-CM | POA: Insufficient documentation

## 2020-12-07 DIAGNOSIS — Z01812 Encounter for preprocedural laboratory examination: Secondary | ICD-10-CM | POA: Insufficient documentation

## 2020-12-07 LAB — CULTURE, BETA STREP (GROUP B ONLY)

## 2020-12-07 LAB — SARS CORONAVIRUS 2 (TAT 6-24 HRS): SARS Coronavirus 2: NEGATIVE

## 2020-12-14 ENCOUNTER — Other Ambulatory Visit (HOSPITAL_COMMUNITY): Payer: Medicaid Other | Attending: Family Medicine

## 2020-12-16 ENCOUNTER — Other Ambulatory Visit: Payer: Self-pay

## 2020-12-16 ENCOUNTER — Inpatient Hospital Stay (HOSPITAL_COMMUNITY): Payer: Medicaid Other

## 2020-12-16 ENCOUNTER — Inpatient Hospital Stay (HOSPITAL_COMMUNITY)
Admission: AD | Admit: 2020-12-16 | Discharge: 2020-12-18 | DRG: 807 | Disposition: A | Discharge status: Home / Self Care | Payer: Medicaid Other | Attending: Obstetrics and Gynecology | Admitting: Obstetrics and Gynecology

## 2020-12-16 ENCOUNTER — Encounter (HOSPITAL_COMMUNITY): Payer: Self-pay | Admitting: Obstetrics & Gynecology

## 2020-12-16 DIAGNOSIS — O1093 Unspecified pre-existing hypertension complicating the puerperium: Secondary | ICD-10-CM | POA: Diagnosis not present

## 2020-12-16 DIAGNOSIS — O1002 Pre-existing essential hypertension complicating childbirth: Secondary | ICD-10-CM | POA: Diagnosis present

## 2020-12-16 DIAGNOSIS — O119 Pre-existing hypertension with pre-eclampsia, unspecified trimester: Secondary | ICD-10-CM | POA: Diagnosis present

## 2020-12-16 DIAGNOSIS — Z87891 Personal history of nicotine dependence: Secondary | ICD-10-CM

## 2020-12-16 DIAGNOSIS — O1493 Unspecified pre-eclampsia, third trimester: Secondary | ICD-10-CM | POA: Diagnosis present

## 2020-12-16 DIAGNOSIS — Z3A38 38 weeks gestation of pregnancy: Secondary | ICD-10-CM

## 2020-12-16 DIAGNOSIS — O1092 Unspecified pre-existing hypertension complicating childbirth: Secondary | ICD-10-CM | POA: Diagnosis not present

## 2020-12-16 DIAGNOSIS — O10919 Unspecified pre-existing hypertension complicating pregnancy, unspecified trimester: Secondary | ICD-10-CM | POA: Diagnosis present

## 2020-12-16 DIAGNOSIS — F259 Schizoaffective disorder, unspecified: Secondary | ICD-10-CM | POA: Diagnosis present

## 2020-12-16 DIAGNOSIS — O0930 Supervision of pregnancy with insufficient antenatal care, unspecified trimester: Secondary | ICD-10-CM

## 2020-12-16 DIAGNOSIS — O114 Pre-existing hypertension with pre-eclampsia, complicating childbirth: Secondary | ICD-10-CM | POA: Diagnosis present

## 2020-12-16 DIAGNOSIS — O115 Pre-existing hypertension with pre-eclampsia, complicating the puerperium: Secondary | ICD-10-CM | POA: Diagnosis not present

## 2020-12-16 LAB — COMPREHENSIVE METABOLIC PANEL
ALT: 16 U/L (ref 0–44)
AST: 21 U/L (ref 15–41)
Albumin: 3 g/dL — ABNORMAL LOW (ref 3.5–5.0)
Alkaline Phosphatase: 116 U/L (ref 38–126)
Anion gap: 8 (ref 5–15)
BUN: 8 mg/dL (ref 6–20)
CO2: 23 mmol/L (ref 22–32)
Calcium: 9.2 mg/dL (ref 8.9–10.3)
Chloride: 105 mmol/L (ref 98–111)
Creatinine, Ser: 0.7 mg/dL (ref 0.44–1.00)
GFR, Estimated: >60 mL/min (ref >60)
Glucose, Bld: 87 mg/dL (ref 70–99)
Potassium: 3.7 mmol/L (ref 3.5–5.1)
Sodium: 136 mmol/L (ref 135–145)
Total Bilirubin: 0.8 mg/dL (ref 0.3–1.2)
Total Protein: 6.5 g/dL (ref 6.5–8.1)

## 2020-12-16 LAB — PROTEIN / CREATININE RATIO, URINE
Creatinine, Urine: 124.83 mg/dL
Protein Creatinine Ratio: 0.18 mg/mg{Cre} — ABNORMAL HIGH (ref 0.00–0.15)
Total Protein, Urine: 23 mg/dL

## 2020-12-16 LAB — CBC
HCT: 33.8 % — ABNORMAL LOW (ref 36.0–46.0)
Hemoglobin: 11.3 g/dL — ABNORMAL LOW (ref 12.0–15.0)
MCH: 31 pg (ref 26.0–34.0)
MCHC: 33.4 g/dL (ref 30.0–36.0)
MCV: 92.6 fL (ref 80.0–100.0)
Platelets: 236 10*3/uL (ref 150–400)
RBC: 3.65 MIL/uL — ABNORMAL LOW (ref 3.87–5.11)
RDW: 12.1 % (ref 11.5–15.5)
WBC: 6.3 10*3/uL (ref 4.0–10.5)
nRBC: 0 % (ref 0.0–0.2)

## 2020-12-16 LAB — TYPE AND SCREEN
ABO/RH(D): O POS
Antibody Screen: NEGATIVE

## 2020-12-16 MED ORDER — LABETALOL HCL 5 MG/ML IV SOLN: 80.0000 mg | INTRAVENOUS | Status: DC | PRN

## 2020-12-16 MED ORDER — MISOPROSTOL 25 MCG QUARTER TABLET: 25.0000 ug | ORAL_TABLET | ORAL | Status: DC | PRN

## 2020-12-16 MED ORDER — TERBUTALINE SULFATE 1 MG/ML IJ SOLN: 0.2500 mg | Freq: Once | INTRAMUSCULAR | Status: DC | PRN

## 2020-12-16 MED ORDER — LABETALOL HCL 5 MG/ML IV SOLN
20.0000 mg | INTRAVENOUS | Status: DC | PRN
  Administered 2020-12-16: 20 mg via INTRAVENOUS
  Filled 2020-12-16: qty 4

## 2020-12-16 MED ORDER — HYDRALAZINE HCL 20 MG/ML IJ SOLN: 10.0000 mg | INTRAMUSCULAR | Status: DC | PRN

## 2020-12-16 MED ORDER — LABETALOL HCL 200 MG PO TABS
200.0000 mg | ORAL_TABLET | Freq: Two times a day (BID) | ORAL | Status: DC
  Administered 2020-12-16: 200 mg via ORAL
  Filled 2020-12-16: qty 1

## 2020-12-16 MED ORDER — ONDANSETRON HCL 4 MG/2ML IJ SOLN: 4.0000 mg | Freq: Four times a day (QID) | INTRAMUSCULAR | Status: DC | PRN

## 2020-12-16 MED ORDER — ACETAMINOPHEN 325 MG PO TABS: 650.0000 mg | ORAL_TABLET | ORAL | Status: DC | PRN

## 2020-12-16 MED ORDER — LABETALOL HCL 5 MG/ML IV SOLN
40.0000 mg | INTRAVENOUS | Status: DC | PRN
  Filled 2020-12-16: qty 8

## 2020-12-16 MED ORDER — LIDOCAINE HCL (PF) 1 % IJ SOLN
30.0000 mL | INTRAMUSCULAR | Status: DC | PRN
Start: 2020-12-16 — End: 2020-12-17

## 2020-12-16 MED ORDER — LACTATED RINGERS IV SOLN: INTRAVENOUS | Status: DC

## 2020-12-16 MED ORDER — LACTATED RINGERS IV SOLN: 500.0000 mL | INTRAVENOUS | Status: DC | PRN

## 2020-12-16 MED ORDER — OXYTOCIN-SODIUM CHLORIDE 30-0.9 UT/500ML-% IV SOLN
1.0000 m[IU]/min | INTRAVENOUS | Status: DC
  Administered 2020-12-16: 2 m[IU]/min via INTRAVENOUS
  Filled 2020-12-16 (×2): qty 500

## 2020-12-16 MED ORDER — OXYTOCIN BOLUS FROM INFUSION
333.0000 mL | Freq: Once | INTRAVENOUS | Status: AC
  Administered 2020-12-16: 333 mL via INTRAVENOUS

## 2020-12-16 MED ORDER — OXYCODONE-ACETAMINOPHEN 5-325 MG PO TABS: 2.0000 | ORAL_TABLET | ORAL | Status: DC | PRN

## 2020-12-16 MED ORDER — OXYCODONE-ACETAMINOPHEN 5-325 MG PO TABS: 1.0000 | ORAL_TABLET | ORAL | Status: DC | PRN

## 2020-12-16 MED ORDER — SOD CITRATE-CITRIC ACID 500-334 MG/5ML PO SOLN: 30.0000 mL | ORAL | Status: DC | PRN

## 2020-12-16 MED ORDER — OXYTOCIN-SODIUM CHLORIDE 30-0.9 UT/500ML-% IV SOLN: 2.5000 [IU]/h | INTRAVENOUS | Status: DC

## 2020-12-16 NOTE — H&P (Signed)
OBSTETRIC ADMISSION HISTORY AND PHYSICAL  Alyssa Mathis is a 35 y.o. female [phone] with IUP at [redacted]w[redacted]d by LMP presenting for IOL secondary to cHTN with superimposed preeclampsia. She reports +FMs, No LOF, no VB, no blurry vision, headaches or peripheral edema, and RUQ pain.  She plans on breast feeding. She is undecided for birth control. She received her prenatal care at [facility]   Dating: By LMP --->  Estimated Date of Delivery: 12/30/20  Sono: @[redacted]w[redacted]d , CWD, normal anatomy, Cephalic presentation, 2489g, EFW  Prenatal History/Complications:  - h/o cHTN with superimposed preeclampsia (amlodipine 5mg  qd, labetalol 200 BID in pregnancy) - h/o schizoaffective disorder (started on zyprexa 5mg  in pregnancy but not currently taking) - H/o HSV (pt denies prior history of oral or genital lesions; non-adherence to suppressive valtrex) - H/o Severe Preeclampsia - H/o Preterm delivery x2  Past Medical History: Past Medical History:  Diagnosis Date  . Anemia   . Hypertension   . Pregnancy induced hypertension   . Schizoaffective disorder [facility])     Past Surgical History: Past Surgical History:  Procedure Laterality Date  . NO PAST SURGERIES      Obstetrical History: OB History    Gravida  6   Para  3   Term  1   Preterm  2   AB  2   Living  3     SAB  2   IAB      Ectopic      Multiple  0   Live Births  3           Social History Social History   Socioeconomic History  . Marital status: Single    Spouse name: Not on file  . Number of children: Not on file  . Years of education: Not on file  . Highest education level: Not on file  Occupational History  . Not on file  Tobacco Use  . Smoking status: Former  . Smokeless tobacco: Never Used  Vaping Use  . Vaping Use: Not on file  Substance and Sexual Activity  . Alcohol use: Never  . Drug use: Not Currently  . Sexual activity: Yes  Other Topics Concern  . Not on file  Social  History Narrative  . Not on file   Social Determinants of Health   Financial Resource Strain: Not on file  Food Insecurity: Not on file  Transportation Needs: Not on file  Physical Activity: Not on file  Stress: Not on file  Social Connections: Not on file    Family History: Family History  Problem Relation Age of Onset  . Hypertension Sister   . Hypertension Maternal Grandmother   . Diabetes Maternal Grandfather   . Hypertension Maternal Grandfather     Allergies: Allergies  Allergen Reactions  . Nifedipine     Other reaction(s): Unknown  . Paliperidone Other (See Comments)    Dystonia    Medications Prior to Admission  Medication Sig Dispense Refill Last Dose  . amLODipine (NORVASC) 5 MG tablet Take 1 tablet (5 mg total) by mouth daily with breakfast. 30 tablet 1   . aspirin 81 MG chewable tablet Chew 81 mg by mouth daily.     . FEROSUL 325 (65 Fe) MG tablet Take 325 mg by mouth every morning.     . labetalol (NORMODYNE) 200 MG tablet Take 1 tablet (200 mg total) by mouth 2 (two) times daily. 60 tablet 1   . OLANZapine (ZYPREXA) 5 MG  tablet Take 1 tablet (5 mg total) by mouth 2 (two) times daily. 60 tablet 1   . Prenat w/o A-FE-Methfol-FA-DHA (PNV-DHA) 27-0.6-0.4-300 MG CAPS Take 1 capsule by mouth daily.     . promethazine (PHENERGAN) 25 MG tablet Take 25 mg by mouth every 6 (six) hours as needed.     . valACYclovir (VALTREX) 500 MG tablet Take 1 tablet (500 mg total) by mouth 2 (two) times daily. 60 tablet 1      Review of Systems   All systems reviewed and negative except as stated in HPI  Last menstrual period 03/25/2020, unknown if currently breastfeeding. General appearance: alert, cooperative and appears stated age Lungs: normal WOB Heart: regular rate  Abdomen: soft, non-tender Extremities: no sign of DVT Neuro: no focal neurologic defects Psych: appropriate mood and affect, denies safety concerns, not responding to internal stimuli, limited insight  regarding medical conditions Presentation: cephalic Fetal monitoringBaseline: 135 bpm, Variability: Good {> 6 bpm), Accelerations: Reactive and Decelerations: Absent Uterine activity: irregular   Prenatal labs: ABO, Rh:   O positive Antibody:  negative Rubella:   RPR: NON REACTIVE (10/15 1738)  HBsAg:   non-reactive HIV: Non Reactive (10/15 1738)  GBS:   Negative 1 hr Glucola 107 Genetic screening not performed Anatomy US wnl  Prenatal Transfer Tool  Maternal Diabetes: No Genetic Screening: not performed Maternal Ultrasounds/Referrals: Normal Fetal Ultrasounds or other Referrals:  None Maternal Substance Abuse:  No Significant Maternal Medications:  None Significant Maternal Lab Results: Group B Strep negative, +HSV   No results found for this or any previous visit (from the past 24 hour(s)).  Patient Active Problem List   Diagnosis Date Noted  . Chronic hypertension affecting pregnancy 12/16/2020  . Schizoaffective disorder, bipolar type (HCC) 06/28/2020  . Subchorionic hematoma in first trimester 06/28/2020  . Schizoaffective disorder (HCC) 06/27/2020  . Pregnant 06/27/2020  . Hypertension 06/27/2020  . Poor compliance 03/03/2018  . Oligohydramnios antepartum, third trimester 03/03/2018  . Spontaneous vaginal delivery 03/03/2018  . Chronic hypertension with superimposed severe preeclampsia 03/01/2018    Assessment/Plan:  Alyssa Mathis is a 35 y.o. [id number] at [redacted]w[redacted]d here for IOL secondary to cHTN with superimposed preeclampsia.  #Labor: Given favorable cervical exam on admission, will initiate pitocin and up-titrate as clinically indicated. #Pain: TBD per pt request #FWB:  Category 1 strip #ID: GBS negative #MOF: breast #MOC: pt declined s/p counseling on admission #Circ: n/a #Schizoaffective disorder: pt with h/o IVC in current pregnancy. Started on Zyprexa but has not continued. No reported safety concerns at this time. Plan for SW consult s/p delivery. #cHTN  with SIPE: blood pressure mild range on admission. No signs or symptoms of preeclampsia. F/u preeclampsia labs on admission. Continue to monitor. #HSV positive: pt with blood testing positive for HSV. However, pt denies any h/o cold sores or genital lesions. No report of prodromal symptoms. No visible genital lesions on admission. No use of suppressive valtrex in pregnancy.  Sheila Oats, MD OB Fellow, Faculty Practice 12/16/2020 7:17 PM

## 2020-12-16 NOTE — Progress Notes (Signed)
Alyssa Mathis is a 35 y.o. [id number] at [redacted]w[redacted]d by LMP admitted for induction of labor due to  [facility] with superimposed preeclampsia..  Subjective: Patient feeling strong contractions   Objective: BP (!) 150/101   Pulse 75   Temp 97.8 F (36.6 C)   Resp 18   Ht 5\' 6"  (1.676 m)   Wt 76.7 kg   LMP 03/25/2020 (Approximate)   SpO2 97%   BMI 27.28 kg/m  No intake/output data recorded. No intake/output data recorded.  FHT:  FHR: 130 bpm, variability: moderate,  accelerations:  Present,  decelerations:  Absent UC:   regular, every 2-3 minutes SVE:   Dilation: 3.5 Effacement (%): 80 Station: -1 Exam by:: Sharea Guinther, MD  Labs: Lab Results  Component Value Date   WBC 6.3 12/16/2020   HGB 11.3 (L) 12/16/2020   HCT 33.8 (L) 12/16/2020   MCV 92.6 12/16/2020   PLT 236 12/16/2020    Assessment / Plan: Induction of labor due to cHTN with superimposed preeclampsia.,  progressing well on pitocin  Labor: Progressing slowly on Pitocin, will continue to increase then AROM at 2230  Preeclampsia:  no signs or symptoms of toxicity and labs stable, received labetalol x1 at 2030. Elevated BP 160/100 at 2215. Will hold mag and wait to give additional labetalol dose until second elevated BP.  Fetal Wellbeing:  Category I Pain Control:  Labor support without medications I/D:  GBS Negative Anticipated MOD:  NSVD  [address] 12/16/2020, 10:42 PM

## 2020-12-16 NOTE — Progress Notes (Signed)
RN notified me of severe range BPs (Dr Myriam Jacobson is in OR)  Vitals:   12/16/20 2204 12/16/20 2205 12/16/20 2207 12/16/20 2300  BP: (!) 157/112  (!) 150/101 (!) 172/111  Pulse: 85 85 75 78  Resp: 18   19  Temp: 97.8 F (36.6 C)     TempSrc:      SpO2:      Weight:      Height:       12/16/20 2300 -- -- -- -- 172/111   FHR reassuring  WIll start Preeclampsia focused order set May need to start on Magnesium Sulfate protocol  Dr Myriam Jacobson to follow

## 2020-12-17 ENCOUNTER — Encounter (HOSPITAL_COMMUNITY): Payer: Self-pay | Admitting: Obstetrics & Gynecology

## 2020-12-17 DIAGNOSIS — O1493 Unspecified pre-eclampsia, third trimester: Secondary | ICD-10-CM | POA: Diagnosis present

## 2020-12-17 DIAGNOSIS — O1092 Unspecified pre-existing hypertension complicating childbirth: Secondary | ICD-10-CM

## 2020-12-17 DIAGNOSIS — Z3A38 38 weeks gestation of pregnancy: Secondary | ICD-10-CM

## 2020-12-17 DIAGNOSIS — O114 Pre-existing hypertension with pre-eclampsia, complicating childbirth: Secondary | ICD-10-CM

## 2020-12-17 LAB — RPR: RPR Ser Ql: NONREACTIVE

## 2020-12-17 MED ORDER — CEFAZOLIN SODIUM-DEXTROSE 2-4 GM/100ML-% IV SOLN
2.0000 g | Freq: Once | INTRAVENOUS | Status: AC
  Administered 2020-12-17: 2 g via INTRAVENOUS
  Filled 2020-12-17: qty 100

## 2020-12-17 MED ORDER — ACETAMINOPHEN 325 MG PO TABS: 650.0000 mg | ORAL_TABLET | ORAL | Status: DC | PRN

## 2020-12-17 MED ORDER — MISOPROSTOL 200 MCG PO TABS
600.0000 ug | ORAL_TABLET | Freq: Once | ORAL | Status: AC
  Administered 2020-12-17: 600 ug via RECTAL
  Filled 2020-12-17: qty 3

## 2020-12-17 MED ORDER — TRANEXAMIC ACID-NACL 1000-0.7 MG/100ML-% IV SOLN
INTRAVENOUS | Status: AC
  Administered 2020-12-17: 1000 mg via INTRAVENOUS
  Filled 2020-12-17: qty 100

## 2020-12-17 MED ORDER — LABETALOL HCL 5 MG/ML IV SOLN: 20.0000 mg | INTRAVENOUS | Status: DC | PRN

## 2020-12-17 MED ORDER — LABETALOL HCL 5 MG/ML IV SOLN
80.0000 mg | INTRAVENOUS | Status: DC | PRN
  Administered 2020-12-17: 80 mg via INTRAVENOUS
  Filled 2020-12-17: qty 16

## 2020-12-17 MED ORDER — LABETALOL HCL 5 MG/ML IV SOLN: 80.0000 mg | INTRAVENOUS | Status: DC | PRN

## 2020-12-17 MED ORDER — ONDANSETRON HCL 4 MG/2ML IJ SOLN: 4.0000 mg | INTRAMUSCULAR | Status: DC | PRN

## 2020-12-17 MED ORDER — HYDRALAZINE HCL 20 MG/ML IJ SOLN: 10.0000 mg | INTRAMUSCULAR | Status: DC | PRN

## 2020-12-17 MED ORDER — SIMETHICONE 80 MG PO CHEW: 80.0000 mg | CHEWABLE_TABLET | ORAL | Status: DC | PRN

## 2020-12-17 MED ORDER — LABETALOL HCL 5 MG/ML IV SOLN: 40.0000 mg | INTRAVENOUS | Status: DC | PRN

## 2020-12-17 MED ORDER — LACTATED RINGERS IV SOLN
INTRAVENOUS | Status: AC
  Administered 2020-12-17: 75 mL/h via INTRAVENOUS

## 2020-12-17 MED ORDER — BENZOCAINE-MENTHOL 20-0.5 % EX AERO
1.0000 "application " | INHALATION_SPRAY | CUTANEOUS | Status: DC | PRN
  Administered 2020-12-17: 1 via TOPICAL
  Filled 2020-12-17: qty 56

## 2020-12-17 MED ORDER — SENNOSIDES-DOCUSATE SODIUM 8.6-50 MG PO TABS
2.0000 | ORAL_TABLET | Freq: Every day | ORAL | Status: DC
  Administered 2020-12-18: 2 via ORAL
  Filled 2020-12-17: qty 2

## 2020-12-17 MED ORDER — MAGNESIUM SULFATE 40 GM/1000ML IV SOLN
2.0000 g/h | INTRAVENOUS | Status: AC
  Administered 2020-12-17: 2 g/h via INTRAVENOUS
  Filled 2020-12-17 (×2): qty 1000

## 2020-12-17 MED ORDER — PRENATAL MULTIVITAMIN CH
1.0000 | ORAL_TABLET | Freq: Every day | ORAL | Status: DC
  Administered 2020-12-17 – 2020-12-18 (×2): 1 via ORAL
  Filled 2020-12-17 (×2): qty 1

## 2020-12-17 MED ORDER — LABETALOL HCL 5 MG/ML IV SOLN
40.0000 mg | INTRAVENOUS | Status: DC | PRN
  Administered 2020-12-17: 40 mg via INTRAVENOUS

## 2020-12-17 MED ORDER — ZOLPIDEM TARTRATE 5 MG PO TABS: 5.0000 mg | ORAL_TABLET | Freq: Every evening | ORAL | Status: DC | PRN

## 2020-12-17 MED ORDER — LABETALOL HCL 5 MG/ML IV SOLN
20.0000 mg | INTRAVENOUS | Status: DC | PRN
Start: 2020-12-17 — End: 2020-12-17

## 2020-12-17 MED ORDER — TRANEXAMIC ACID-NACL 1000-0.7 MG/100ML-% IV SOLN: 1000.0000 mg | Freq: Once | INTRAVENOUS | Status: AC

## 2020-12-17 MED ORDER — MAGNESIUM SULFATE BOLUS VIA INFUSION
4.0000 g | Freq: Once | INTRAVENOUS | Status: AC
  Administered 2020-12-17: 4 g via INTRAVENOUS
  Filled 2020-12-17: qty 1000

## 2020-12-17 MED ORDER — WITCH HAZEL-GLYCERIN EX PADS: 1.0000 "application " | MEDICATED_PAD | CUTANEOUS | Status: DC | PRN

## 2020-12-17 MED ORDER — DIPHENHYDRAMINE HCL 25 MG PO CAPS: 25.0000 mg | ORAL_CAPSULE | Freq: Four times a day (QID) | ORAL | Status: DC | PRN

## 2020-12-17 MED ORDER — IBUPROFEN 600 MG PO TABS
600.0000 mg | ORAL_TABLET | Freq: Four times a day (QID) | ORAL | Status: DC
  Administered 2020-12-17 – 2020-12-18 (×4): 600 mg via ORAL
  Filled 2020-12-17 (×4): qty 1

## 2020-12-17 MED ORDER — COCONUT OIL OIL
1.0000 "application " | TOPICAL_OIL | Status: DC | PRN
  Administered 2020-12-17: 1 via TOPICAL

## 2020-12-17 MED ORDER — AMLODIPINE BESYLATE 10 MG PO TABS
10.0000 mg | ORAL_TABLET | Freq: Every day | ORAL | Status: DC
  Administered 2020-12-17 – 2020-12-18 (×2): 10 mg via ORAL
  Filled 2020-12-17 (×2): qty 1

## 2020-12-17 MED ORDER — ONDANSETRON HCL 4 MG PO TABS: 4.0000 mg | ORAL_TABLET | ORAL | Status: DC | PRN

## 2020-12-17 MED ORDER — DIBUCAINE (PERIANAL) 1 % EX OINT: 1.0000 "application " | TOPICAL_OINTMENT | CUTANEOUS | Status: DC | PRN

## 2020-12-17 MED ORDER — TETANUS-DIPHTH-ACELL PERTUSSIS 5-2.5-18.5 LF-MCG/0.5 IM SUSY: 0.5000 mL | PREFILLED_SYRINGE | Freq: Once | INTRAMUSCULAR | Status: DC

## 2020-12-17 NOTE — Progress Notes (Addendum)
Called to bedside about 1.5 hours after delivery due to persistent bleeding and expression of clot with fundal rub after delivery. Initial EBL ~100 cc's at time of delivery. Received cytotec rectally and 1g TXA approx 45 minutes after delivery. Verbal consent obtained and manual sweep performed. Plum sized amount of ?placenta removed, with good subsequent result. In and out cath also performed for 600 cc's urine. Fundus firm and 1 below umbilicus. Will continue to monitor. Ancef 1 g ordered. EBL approx 350 ccs.    Casper Harrison, MD [facility] Fellow, [facility] for [facility], [facility]

## 2020-12-17 NOTE — Lactation Note (Signed)
This note was copied from a baby's chart. Lactation Consultation Note  Patient Name: Alyssa Mathis [id number] Date: 12/17/2020 Reason for consult: Follow-up assessment;Early term 37-38.6wks Age:34 hours   LC in to visit with P4 Mom of ET infant. Mom on MgSO4 for GHTN.  Baby was in CN for last 4 hrs as Mom was exhausted and didn't have support person in room.  Baby fed 10 mins in L&D and was spoon fed 4 ml colostrum.   Baby came back from CN crying and Mom had baby latched in cradle hold while swaddled tightly.  Baby appeared asleep at the breast with a shallow latch.  Offered to help with STS.  Nipple appeared pinched after taken off breast.  Removed swaddle and placed baby STS back in cradle hold.  Baby assisted to latch with a deeper latch to breast, Mom states she feels a good tug. Basic education reviewed.    Encouraged STS and feeding baby often with cues.  Explained about normal newborn sleepiness and importance of STS to encourage breastfeeding.  Lactation brochure left with Mom.  Mom aware of IP and OP lactation support available to her, encouraged to call prn   LATCH Score Latch: Grasps breast easily, tongue down, lips flanged, rhythmical sucking.  Audible Swallowing: A few with stimulation  Type of Nipple: Everted at rest and after stimulation  Comfort (Breast/Nipple): Soft / non-tender  Hold (Positioning): Assistance needed to correctly position infant at breast and maintain latch.  LATCH Score: 8   Lactation Tools Discussed/Used Tools: Pump Breast pump type: Manual Reason for Pumping: Mother request hand pump  Interventions Interventions: Breast feeding basics reviewed;Assisted with latch;Skin to skin;Breast massage;Hand express;Adjust position;Support pillows;Hand pump;Education   Consult Status Consult Status: Follow-up Date: 12/18/20 Follow-up type: In-patient    Judee Clara 12/17/2020, 9:20 AM

## 2020-12-17 NOTE — Discharge Summary (Signed)
Postpartum Discharge Summary     Patient Name: Alyssa Mathis DOB: Feb 12, 1986 MRN: [id number]  Date of admission: 12/16/2020 Delivery date:12/16/2020  Delivering provider: Norval Morton  Date of discharge: 12/18/2020  Admitting diagnosis: Chronic hypertension affecting pregnancy [O10.919] Preeclampsia, third trimester [O14.93] Intrauterine pregnancy: [redacted]w[redacted]d     Secondary diagnosis:  Active Problems:   Chronic hypertension with superimposed severe preeclampsia   Schizoaffective disorder (HCC)   Chronic hypertension affecting pregnancy   Preeclampsia, third trimester   Insufficient prenatal care  Additional problems: none    Discharge diagnosis: Term Pregnancy Delivered and CHTN with superimposed preeclampsia                                              Post partum procedures:None Augmentation: AROM and Pitocin Complications: None  Hospital course: Induction of Labor With Vaginal Delivery   35 y.o. yo [id number] at [redacted]w[redacted]d was admitted to the hospital 12/16/2020 for induction of labor.  Indication for induction: Chronic hypertension with superimposed preeclampsia with severe features.  Patient had an uncomplicated labor course as follows: Membrane Rupture Time/Date: 10:33 PM ,12/16/2020   Delivery Method:Vaginal, Spontaneous  Episiotomy: None  Lacerations:  1st degree;Perineal  Details of delivery can be found in separate delivery note.   *PP: routine care. Patient declines any birth control. Breast. Female infant *cHTN with severe pre-eclampsia: continue norvasc. S/p Mg *Psych: [facility] and started on zyprexa for her schizoaffective disorder, but she states she didn't start it as an outpatient, she hasn't seen them for f/u care and doesn't want to see them for follow up care. Request sent to clinic to have patient seen later this week by behavioral health and for a BP check *Insufficient prenatal care: SW consulted today and no issues to d/c  Newborn  Data: Birth date:12/16/2020  Birth time:11:45 PM  Gender:Female  Living status:Living  Apgars:8 ,9  Weight:3226 g   Magnesium Sulfate received: Yes: Seizure prophylaxis BMZ received: No Rhophylac:N/A  Physical exam  Vitals:   12/17/20 1553 12/17/20 2036 12/17/20 2349 12/18/20 0433  BP: 124/73 130/84 136/83 (!) 132/91  Pulse: (!) 107 92 86 85  Resp: 18 18 18 16   Temp: 98 F (36.7 C) 98 F (36.7 C) 97.6 F (36.4 C) 98.3 F (36.8 C)  TempSrc: Oral Oral Oral Oral  SpO2: 98% 99% 99% 99%  Weight:      Height:       General: Well nourished, well developed female in no acute distress. Abdomen: gravid nttp Cardiovascular: S1, S2 normal, no murmur, rub or gallop, regular rate and rhythm Respiratory: CTAB Extremities: no clubbing, cyanosis or edema Skin: Warm and dry.  Labs: Lab Results  Component Value Date   WBC 6.3 12/16/2020   HGB 11.3 (L) 12/16/2020   HCT 33.8 (L) 12/16/2020   MCV 92.6 12/16/2020   PLT 236 12/16/2020   CMP Latest Ref Rng & Units 12/16/2020  Glucose 70 - 99 mg/dL 87  BUN 6 - 20 mg/dL 8  Creatinine 02/15/2021 - 4.26 mg/dL 8.34  Sodium 1.96 - 222 mmol/L 136  Potassium 3.5 - 5.1 mmol/L 3.7  Chloride 98 - 111 mmol/L 105  CO2 22 - 32 mmol/L 23  Calcium 8.9 - 10.3 mg/dL 9.2  Total Protein 6.5 - 8.1 g/dL 6.5  Total Bilirubin 0.3 - 1.2 mg/dL 0.8  Alkaline Phos 38 - 126 U/L 116  AST 15 - 41 U/L 21  ALT 0 - 44 U/L 16   Edinburgh Score: Edinburgh Postnatal Depression Scale Screening Tool 12/18/2020  I have been able to laugh and see the funny side of things. 0  I have looked forward with enjoyment to things. 0  I have blamed myself unnecessarily when things went wrong. 0  I have been anxious or worried for no good reason. 0  I have felt scared or panicky for no good reason. 0  Things have been getting on top of me. 0  I have been so unhappy that I have had difficulty sleeping. 0  I have felt sad or miserable. 0  I have been so unhappy that I have been crying. 0   The thought of harming myself has occurred to me. 0  Edinburgh Postnatal Depression Scale Total 0      After visit meds:  Allergies as of 12/18/2020      Reactions   Nifedipine    Other reaction(s): Unknown   Paliperidone Other (See Comments)   Dystonia   Latex Rash      Medication List    STOP taking these medications   aspirin 81 MG chewable tablet   FeroSul 325 (65 FE) MG tablet Generic drug: ferrous sulfate   labetalol 200 MG tablet Commonly known as: NORMODYNE   OLANZapine 5 MG tablet Commonly known as: ZYPREXA   promethazine 25 MG tablet Commonly known as: PHENERGAN   valACYclovir 500 MG tablet Commonly known as: Valtrex     TAKE these medications   acetaminophen 325 MG tablet Commonly known as: Tylenol Take 2 tablets (650 mg total) by mouth every 4 (four) hours as needed (for pain scale < 4).   amLODipine 10 MG tablet Commonly known as: NORVASC Take 1 tablet (10 mg total) by mouth daily. What changed:   medication strength  how much to take  when to take this   ibuprofen 600 MG tablet Commonly known as: ADVIL Take 1 tablet (600 mg total) by mouth every 6 (six) hours as needed.   PNV-DHA 27-0.6-0.4-300 MG Caps Take 1 capsule by mouth daily.        Discharge home in stable condition Infant Feeding: Breast Infant Disposition:home with mother Discharge instruction: per After Visit Summary and Postpartum booklet. Activity: Advance as tolerated. Pelvic rest for 6 weeks.  Diet: routine diet Anticipated Birth Control: declined Postpartum Appointment:1 week Additional Postpartum F/U: Postpartum Depression checkup and BP check 1 week Future Appointments:No future appointments. Follow up Visit:      12/18/2020 [place] Bing, MD

## 2020-12-17 NOTE — Lactation Note (Signed)
This note was copied from a baby's chart. Lactation Consultation Note  Patient Name: Alyssa Mathis [id number] Date: 12/17/2020 Reason for consult: L&D Initial assessment;Early term 37-38.6wks Age:34 hours P4, ETI female infant. LC entered room, mom was doing STS with infant. Infant was not attempting to latch at first, mom hand expressed and infant took 4 mls of colostrum by spoon. Afterwards infant started to cue to breastfeed, mom latched infant on her left breast using the football hold position, infant sustained latch and breastfeed for 10 minutes.  Mom knows to breastfeed infant according to cues, 8 to 12 or more times within 24 hours, STS. Mom knows to call RN or [facility] for assistance with latching infant at the breast if needed. LC discussed infant I&O with mom.  Mom was given hand pump for prm, due to not having hand pump at home.  Maternal Data Has patient been taught Hand Expression?: Yes Does the patient have breastfeeding experience prior to this delivery?: Yes How long did the patient breastfeed?: Per mom, she BF her other 3 children for one year each.  Feeding Mother's Current Feeding Choice: Breast Milk  LATCH Score Latch: Grasps breast easily, tongue down, lips flanged, rhythmical sucking.  Audible Swallowing: A few with stimulation  Type of Nipple: Everted at rest and after stimulation  Comfort (Breast/Nipple): Soft / non-tender  Hold (Positioning): Assistance needed to correctly position infant at breast and maintain latch.  LATCH Score: 8   Lactation Tools Discussed/Used Tools: Pump Breast pump type: Manual Pump Education: Setup, frequency, and cleaning;Milk Storage Reason for Pumping: prn mom doesn't have breast pump at home. Pumping frequency: prn  Interventions Interventions: Breast feeding basics reviewed;Assisted with latch;Skin to skin;Breast massage;Hand express;Breast compression;Adjust position;Support pillows;Position options;Expressed milk;Hand  pump;Education  Discharge Pump: Manual WIC Program: No  Consult Status Consult Status: Follow-up Date: 12/17/20 Follow-up type: In-patient    Danelle Earthly 12/17/2020, 1:20 AM

## 2020-12-17 NOTE — Progress Notes (Signed)
Patient was swabbed and tested negative for Covid 12/07/2020. Notified charge nurse to see if the patient needed to be swabbed again this admission.  Instructed by L&D charge nurse to reach out to the [facility].  The [facility], [place], stated the patient doesn't need to be retested since its within 90 days.

## 2020-12-17 NOTE — Progress Notes (Signed)
Post Partum Day 1 Subjective: no complaints  Objective: Blood pressure (!) 148/92, pulse 83, temperature 98.3 F (36.8 C), temperature source Oral, resp. rate 18, height 5\' 6"  (1.676 m), weight 76.7 kg, last menstrual period 03/25/2020, SpO2 97 %, unknown if currently breastfeeding.  Physical Exam:  General: alert, cooperative and no distress Lochia: appropriate Uterine Fundus: firm  DVT Evaluation: No evidence of DVT seen on physical exam.  Recent Labs    12/16/20 1822  HGB 11.3*  HCT 33.8*    Assessment/Plan: Magnesium for 24 hr PP, Norvasc 10 mg   LOS: 1 day   02/15/21 12/17/2020, 7:59 AM

## 2020-12-18 DIAGNOSIS — O0930 Supervision of pregnancy with insufficient antenatal care, unspecified trimester: Secondary | ICD-10-CM

## 2020-12-18 DIAGNOSIS — O115 Pre-existing hypertension with pre-eclampsia, complicating the puerperium: Secondary | ICD-10-CM

## 2020-12-18 DIAGNOSIS — O1093 Unspecified pre-existing hypertension complicating the puerperium: Secondary | ICD-10-CM

## 2020-12-18 MED ORDER — IBUPROFEN 600 MG PO TABS: 600.0000 mg | ORAL_TABLET | Freq: Four times a day (QID) | ORAL | 0 refills | Status: DC | PRN

## 2020-12-18 MED ORDER — ACETAMINOPHEN 325 MG PO TABS: 650.0000 mg | ORAL_TABLET | ORAL | Status: DC | PRN

## 2020-12-18 MED ORDER — AMLODIPINE BESYLATE 10 MG PO TABS: 10.0000 mg | ORAL_TABLET | Freq: Every day | ORAL | 0 refills | Status: DC
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
## 2020-12-18 NOTE — Progress Notes (Signed)
Pt  Discharge  Teaching  Reviewed  Pt ready for d/c

## 2020-12-18 NOTE — Plan of Care (Signed)
  Problem: Education: Goal: Knowledge of General Education information will improve Description: Including pain rating scale, medication(s)/side effects and non-pharmacologic comfort measures Outcome: Adequate for Discharge   

## 2020-12-18 NOTE — Progress Notes (Addendum)
Daily Postpartum Note  Admission Date: 12/16/2020 Current Date: 12/18/2020 9:22 AM  Alyssa Mathis is a 35 y.o. [id number] PPD#2 s/p SVD/1st degree (not repaired) @ [redacted]w[redacted]d by; pt  admitted for IOL for cHTN with superimposed mild pre-eclampsia. Patient developed severe pre-eclampsia based on BPs during IOL.   Pregnancy complicated by: Patient Active Problem List   Diagnosis Date Noted  . Insufficient prenatal care 12/18/2020  . Preeclampsia, third trimester 12/17/2020  . Chronic hypertension affecting pregnancy 12/16/2020  . 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 History of preterm delivery 08/08/2020  . History of pre-eclampsia 08/03/2020  . Schizoaffective disorder, bipolar type (HCC) 06/28/2020  . Subchorionic hematoma in first trimester 06/28/2020  . Schizoaffective disorder (HCC) 06/27/2020  . Chronic hypertension 06/27/2020  . Supervision of high risk pregnancy in second trimester 06/16/2020  . Poor compliance 03/03/2018  . Oligohydramnios antepartum, third trimester 03/03/2018  . Spontaneous vaginal delivery 03/03/2018  . Chronic hypertension with superimposed severe preeclampsia 03/01/2018    Overnight/24hr events:  None  Subjective:  No s/s of pre-eclampsia. Pt meeting all PP goals  Objective:    Current Vital Signs 24h Vital Sign Ranges  T  (PT SLEEPING) Temp  Avg: 98 F (36.7 C)  Min: 97.6 F (36.4 C)  Max: 98.3 F (36.8 C)  BP (!) 132/91 BP  Min: 124/73  Max: 139/90  HR 85 Pulse  Avg: 92.2  Min: 85  Max: 107  RR 16 Resp  Avg: 17.6  Min: 16  Max: 18  SaO2 99 %  (room air) SpO2  Avg: 98.4 %  Min: 97 %  Max: 99 %       24 Hour I/O Current Shift I/O  Time Ins Outs 04/02 0701 - 04/03 0700 In: 4020.7 [P.O.:1080; I.V.:2940.7] Out: 4800 [Urine:4800] 04/03 0701 - 04/03 1900 In: -  Out: 500 [Urine:500]   Patient Vitals for the past 24 hrs:  BP Temp Temp src Pulse Resp SpO2  12/18/20 0433 (!) 132/91 98.3 F (36.8 C) Oral 85 16 99 %  12/17/20 2349 136/83 97.6 F (36.4 C) Oral  86 18 99 %  12/17/20 2036 130/84 98 F (36.7 C) Oral 92 18 99 %  12/17/20 1553 124/73 98 F (36.7 C) Oral (!) 107 18 98 %  12/17/20 1151 139/90 98 F (36.7 C) Oral 91 18 97 %    Physical exam: General: Well nourished, well developed female in no acute distress. Abdomen: gravid nttp Cardiovascular: S1, S2 normal, no murmur, rub or gallop, regular rate and rhythm Respiratory: CTAB Extremities: no clubbing, cyanosis or edema Skin: Warm and dry.   Medications: Current Facility-Administered Medications  Medication Dose Route Frequency Provider Last Rate Last Admin  . acetaminophen (TYLENOL) tablet 650 mg  650 mg Oral Q4H PRN Muus, Raphael Gibney, MD      . amLODipine (NORVASC) tablet 10 mg  10 mg Oral Daily Muus, Raphael Gibney, MD   10 mg at 12/17/20 0958  . benzocaine-Menthol (DERMOPLAST) 20-0.5 % topical spray 1 application  1 application Topical PRN Muus, Raphael Gibney, MD   1 application at 12/17/20 [phone]  . coconut oil  1 application Topical PRN Muus, Raphael Gibney, MD   1 application at 12/17/20 [phone]  . witch hazel-glycerin (TUCKS) pad 1 application  1 application Topical PRN Muus, Raphael Gibney, MD       And  . dibucaine (NUPERCAINAL) 1 % rectal ointment 1 application  1 application Rectal PRN Muus, Raphael Gibney, MD      . diphenhydrAMINE (  BENADRYL) capsule 25 mg  25 mg Oral Q6H PRN Muus, Raphael Gibney, MD      . labetalol (NORMODYNE) injection 20 mg  20 mg Intravenous PRN Muus, Raphael Gibney, MD       And  . labetalol (NORMODYNE) injection 40 mg  40 mg Intravenous PRN Muus, Raphael Gibney, MD       And  . labetalol (NORMODYNE) injection 80 mg  80 mg Intravenous PRN Muus, Raphael Gibney, MD       And  . hydrALAZINE (APRESOLINE) injection 10 mg  10 mg Intravenous PRN Muus, Raphael Gibney, MD      . ibuprofen (ADVIL) tablet 600 mg  600 mg Oral Q6H Muus, Raphael Gibney, MD   600 mg at 12/17/20 1758  . ondansetron (ZOFRAN) tablet 4 mg  4 mg Oral Q4H PRN Muus, Raphael Gibney, MD       Or  . ondansetron (ZOFRAN) injection 4 mg  4 mg Intravenous Q4H PRN Muus, Raphael Gibney, MD      .  prenatal multivitamin tablet 1 tablet  1 tablet Oral Q1200 Muus, Raphael Gibney, MD   1 tablet at 12/17/20 1236  . senna-docusate (Senokot-S) tablet 2 tablet  2 tablet Oral Daily Muus, Raphael Gibney, MD      . simethicone (MYLICON) chewable tablet 80 mg  80 mg Oral PRN Muus, Raphael Gibney, MD      . Tdap (BOOSTRIX) injection 0.5 mL  0.5 mL Intramuscular Once Muus, Raphael Gibney, MD      . zolpidem (AMBIEN) tablet 5 mg  5 mg Oral QHS PRN Muus, Raphael Gibney, MD        Labs:  No new labs  Radiology:  No new imaging  Assessment & Plan:  Pt stable *PP: routine care. Patient declines any birth control. Breast. Female infant *cHTN with severe pre-eclampsia: continue norvasc. S/p Mg *Psych: [facility] and started on zyprexa for her schizoaffective disorder, but she states she didn't start it as an outpatient, she hasn't seen them for f/u care and doesn't want to see them for follow up care. Request sent to clinic to have patient seen later this week by behavioral health and for a BP check *Insufficient prenatal care: SW consulted today *Dispo: fine for d/c to home today after seen by SW  Cornelia Copa MD Attending Center for [facility]Faculty Practice) GYN Consult Phone: [phone] (M-F, 0800-1700) & [phone] (Off hours, weekends, holidays)

## 2020-12-18 NOTE — Lactation Note (Signed)
This note was copied from a baby's chart. Lactation Consultation Note  Patient Name: Alyssa Mathis Today's Date: 12/18/2020   Age:34 hours   Mom in bed asleep and baby asleep in bassinet when LC attempted to visit.  LC wi. Follow up.     Maternal Data    Feeding Nipple Type: Slow - flow  LATCH Score                    Lactation Tools Discussed/Used    Interventions    Discharge    Consult Status      Maryruth Hancock [facility] 12/18/2020, 7:45 AM

## 2020-12-18 NOTE — Lactation Note (Signed)
This note was copied from a baby's chart. Lactation Consultation Note  Patient Name: Girl Breigh Annett [id number] Date: 12/18/2020 Reason for consult: Follow-up assessment;Early term 67-38.6wks Age:34 hours   Mom states she needs an electric pump.  She does not have WIC and is not interested in renting a pump from the hospital.  She told the [facility] she not able to call her insurance right now to inquire about a DEBP.     LC offered to assist her with BF as infant stirred in bassinet.  Mom declined and told LC she knows how to feed her baby.  LC inquired about how she prefers to feed her infant and Mom states she will put her milk in a bottle and feed like she did with her other children.    3 ounces of milk sitting on bedside table.  LC praised mom for efforts.  LC reviewed storage guidelines with mom and she said she was already aware.    LC encouraged mom to call out if she had any questions.  Dad at bedside was informed about hospital pump rental after he inquired from [facility].      Maternal Data    Feeding Mother's Current Feeding Choice: Breast Milk  LATCH Score                    Lactation Tools Discussed/Used Tools: Pump Breast pump type: Double-Electric Breast Pump Pump Education: Milk Storage Reason for Pumping: Mom prefers to pump and give her infant breastmilk in a bottle Pumped volume: 3 mL (3 ounces last pumping session)  Interventions Interventions: DEBP;Education;Hand pump  Discharge Pump: DEBP;Manual WIC Program: No  Consult Status Consult Status: Follow-up Date: 12/19/20 Follow-up type: In-patient    Maryruth Hancock [facility] 12/18/2020, 9:14 AM

## 2020-12-22 ENCOUNTER — Ambulatory Visit: Payer: Medicaid Other

## 2020-12-26 ENCOUNTER — Institutional Professional Consult (permissible substitution): Payer: Medicaid Other

## 2020-12-27 NOTE — BH Specialist Note (Deleted)
Integrated Behavioral Health via Telemedicine Visit  12/27/2020 Alyssa Mathis [id number]  Number of Integrated Behavioral Health visits: 1 Session Start time: 1:15***  Session End time: 2:15*** Total time: {IBH Total Time:21014050}  Referring Provider: *** Patient/Family location: Home*** [facility]: [facility]  All persons participating in visit: Patient *** and Alyssa Mathis ***  Types of Service: {CHL AMB TYPE OF SERVICE:[phone]}  I connected with Savanah Tadesse and/or Sheilyn Devargas's {family members:20773} via  Telephone or Engineer, civil (consulting)  (Video is Caregility application) and verified that I am speaking with the correct person using two identifiers. Discussed confidentiality: {YES/NO:21197}  I discussed the limitations of telemedicine and the availability of in person appointments.  Discussed there is a possibility of technology failure and discussed alternative modes of communication if that failure occurs.  I discussed that engaging in this telemedicine visit, they consent to the provision of behavioral healthcare and the services will be billed under their insurance.  Patient and/or legal guardian expressed understanding and consented to Telemedicine visit: {YES/NO:21197}  Presenting Concerns: Patient and/or family reports the following symptoms/concerns: *** Duration of problem: ***; Severity of problem: {Mild/Moderate/Severe:20260}  Patient and/or Family's Strengths/Protective Factors: {CHL AMB BH PROTECTIVE FACTORS:[phone]}  Goals Addressed: Patient will: 1.  Reduce symptoms of: {IBH Symptoms:21014056}  2.  Increase knowledge and/or ability of: {IBH Patient Tools:21014057}  3.  Demonstrate ability to: {IBH Goals:21014053}  Progress towards Goals: {CHL AMB BH PROGRESS TOWARDS GOALS:[phone]}  Interventions: Interventions utilized:  {IBH  Interventions:21014054} Standardized Assessments completed: {IBH Screening Tools:21014051}  Patient and/or Family Response: ***  Assessment: Patient currently experiencing ***.   Patient may benefit from ***.  Plan: 1. Follow up with behavioral health clinician on : *** 2. Behavioral recommendations: *** 3. Referral(s): {IBH Referrals:21014055}  I discussed the assessment and treatment plan with the patient and/or parent/guardian. They were provided an opportunity to ask questions and all were answered. They agreed with the plan and demonstrated an understanding of the instructions.   They were advised to call back or seek an in-person evaluation if the symptoms worsen or if the condition fails to improve as anticipated.  [place]etta Close Dave Mannes, LCSW

## 2020-12-28 ENCOUNTER — Ambulatory Visit: Payer: Medicaid Other

## 2021-01-03 ENCOUNTER — Institutional Professional Consult (permissible substitution): Payer: Medicaid Other

## 2021-01-11 ENCOUNTER — Other Ambulatory Visit: Payer: Self-pay

## 2021-01-11 ENCOUNTER — Inpatient Hospital Stay (HOSPITAL_COMMUNITY)
Admission: AD | Admit: 2021-01-11 | Discharge: 2021-01-12 | Disposition: A | Discharge status: Home / Self Care | Payer: Medicaid Other | Attending: Obstetrics & Gynecology | Admitting: Obstetrics & Gynecology

## 2021-01-11 ENCOUNTER — Encounter (HOSPITAL_COMMUNITY): Payer: Self-pay | Admitting: Obstetrics & Gynecology

## 2021-01-11 DIAGNOSIS — R5383 Other fatigue: Secondary | ICD-10-CM | POA: Insufficient documentation

## 2021-01-11 DIAGNOSIS — N611 Abscess of the breast and nipple: Secondary | ICD-10-CM | POA: Diagnosis not present

## 2021-01-11 DIAGNOSIS — O99893 Other specified diseases and conditions complicating puerperium: Secondary | ICD-10-CM | POA: Insufficient documentation

## 2021-01-11 DIAGNOSIS — O1003 Pre-existing essential hypertension complicating the puerperium: Secondary | ICD-10-CM

## 2021-01-11 DIAGNOSIS — O165 Unspecified maternal hypertension, complicating the puerperium: Secondary | ICD-10-CM | POA: Diagnosis not present

## 2021-01-11 DIAGNOSIS — F53 Postpartum depression: Secondary | ICD-10-CM | POA: Diagnosis not present

## 2021-01-11 DIAGNOSIS — N898 Other specified noninflammatory disorders of vagina: Secondary | ICD-10-CM | POA: Diagnosis not present

## 2021-01-11 DIAGNOSIS — R102 Pelvic and perineal pain: Secondary | ICD-10-CM | POA: Diagnosis not present

## 2021-01-11 DIAGNOSIS — Z79899 Other long term (current) drug therapy: Secondary | ICD-10-CM | POA: Diagnosis not present

## 2021-01-11 DIAGNOSIS — O10919 Unspecified pre-existing hypertension complicating pregnancy, unspecified trimester: Secondary | ICD-10-CM

## 2021-01-11 DIAGNOSIS — O99345 Other mental disorders complicating the puerperium: Secondary | ICD-10-CM | POA: Diagnosis not present

## 2021-01-11 DIAGNOSIS — N6310 Unspecified lump in the right breast, unspecified quadrant: Secondary | ICD-10-CM | POA: Insufficient documentation

## 2021-01-11 DIAGNOSIS — Z87891 Personal history of nicotine dependence: Secondary | ICD-10-CM | POA: Diagnosis not present

## 2021-01-11 DIAGNOSIS — Z9189 Other specified personal risk factors, not elsewhere classified: Secondary | ICD-10-CM

## 2021-01-11 LAB — CBC
HCT: 42.6 % (ref 36.0–46.0)
Hemoglobin: 14 g/dL (ref 12.0–15.0)
MCH: 29.8 pg (ref 26.0–34.0)
MCHC: 32.9 g/dL (ref 30.0–36.0)
MCV: 90.6 fL (ref 80.0–100.0)
Platelets: 251 10*3/uL (ref 150–400)
RBC: 4.7 MIL/uL (ref 3.87–5.11)
RDW: 11.6 % (ref 11.5–15.5)
WBC: 5.4 10*3/uL (ref 4.0–10.5)
nRBC: 0 % (ref 0.0–0.2)

## 2021-01-11 LAB — COMPREHENSIVE METABOLIC PANEL
ALT: 27 U/L (ref 0–44)
AST: 30 U/L (ref 15–41)
Albumin: 4.3 g/dL (ref 3.5–5.0)
Alkaline Phosphatase: 98 U/L (ref 38–126)
Anion gap: 12 (ref 5–15)
BUN: 6 mg/dL (ref 6–20)
CO2: 24 mmol/L (ref 22–32)
Calcium: 9.5 mg/dL (ref 8.9–10.3)
Chloride: 103 mmol/L (ref 98–111)
Creatinine, Ser: 0.92 mg/dL (ref 0.44–1.00)
GFR, Estimated: >60 mL/min (ref >60)
Glucose, Bld: 97 mg/dL (ref 70–99)
Potassium: 3.5 mmol/L (ref 3.5–5.1)
Sodium: 139 mmol/L (ref 135–145)
Total Bilirubin: 1.1 mg/dL (ref 0.3–1.2)
Total Protein: 7.7 g/dL (ref 6.5–8.1)

## 2021-01-11 MED ORDER — NIFEDIPINE ER OSMOTIC RELEASE 30 MG PO TB24: 30.0000 mg | ORAL_TABLET | Freq: Every day | ORAL | 11 refills | Status: DC

## 2021-01-11 MED ORDER — LABETALOL HCL 5 MG/ML IV SOLN: 40.0000 mg | INTRAVENOUS | Status: DC | PRN

## 2021-01-11 MED ORDER — NIFEDIPINE 10 MG PO CAPS
20.0000 mg | ORAL_CAPSULE | ORAL | Status: DC | PRN
  Filled 2021-01-11: qty 2

## 2021-01-11 MED ORDER — NIFEDIPINE 10 MG PO CAPS
10.0000 mg | ORAL_CAPSULE | ORAL | Status: DC | PRN
  Administered 2021-01-11: 10 mg via ORAL
  Filled 2021-01-11: qty 1

## 2021-01-11 MED ORDER — NIFEDIPINE 10 MG PO CAPS
20.0000 mg | ORAL_CAPSULE | ORAL | Status: DC | PRN
  Administered 2021-01-11: 20 mg via ORAL

## 2021-01-11 NOTE — MAU Note (Signed)
Pt states she wants to see if everything is okay with her and the baby. S/p vaginal delivery on 04/01. Denies problems.

## 2021-01-11 NOTE — MAU Provider Note (Signed)
Chief Complaint: Hypertension   None     SUBJECTIVE HPI: Alyssa Mathis is a 35 y.o. [id number] who is 4 weeks s/p vaginal delivery after IOL for CHTN with SIP who presents to maternity admissions reporting she is having vaginal pain and is tired all the time.  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 She reports she has never had an outbreak but now she is worried about this finding. She desires an exam today. She reports she is concerned about postpartum depression but denies any risks of harming herself or others. She is able to enjoy things as much as usual but is tired all the time and knows this can be a sign of depression. She also reports decreased appetite since having the baby.  She reports light lochia, spotting only.    Pt admitted on 12/16/20 and induced for [facility] with SIP and was given magnesium sulfate IV intrapartum.  Pt discharged with Norvasc 10 mg daily Rx but pt has not taken it as she was not sure it was needed. She does not want to continue care at [facility] and did not go to her postpartum visit with them. She desires to follow up postpartum with [facility].  HPI  Past Medical History:  Diagnosis Date  . Anemia   . Hypertension   . Pregnancy induced hypertension   . Schizoaffective disorder [facility])    Past Surgical History:  Procedure Laterality Date  . NO PAST SURGERIES     Social History   Socioeconomic History  . Marital status: Single    Spouse name: Not on file  . Number of children: Not on file  . Years of education: Not on file  . Highest education level: Not on file  Occupational History  . Not on file  Tobacco Use  . Smoking status: Former Games developer  . Smokeless tobacco: Never Used  Vaping Use  . Vaping Use: Not on file  Substance and Sexual Activity  . Alcohol use: Never  . Drug use: Not Currently  . Sexual activity: Yes  Other Topics Concern  . Not on  file  Social History Narrative  . Not on file   Social Determinants of Health   Financial Resource Strain: Not on file  Food Insecurity: Not on file  Transportation Needs: Not on file  Physical Activity: Not on file  Stress: Not on file  Social Connections: Not on file  Intimate Partner Violence: Not on file   No current facility-administered medications on file prior to encounter.   Current Outpatient Medications on File Prior to Encounter  Medication Sig Dispense Refill  . acetaminophen (TYLENOL) 325 MG tablet Take 2 tablets (650 mg total) by mouth every 4 (four) hours as needed (for pain scale < 4).    [name] amLODipine (NORVASC) 10 MG tablet Take 1 tablet (10 mg total) by mouth daily. 90 tablet 0  . ibuprofen (ADVIL) 600 MG tablet Take 1 tablet (600 mg total) by mouth every 6 (six) hours as needed. 30 tablet 0  . Prenat w/o A-FE-Methfol-FA-DHA (PNV-DHA) 27-0.6-0.4-300 MG CAPS Take 1 capsule by mouth daily.     Allergies  Allergen Reactions  . Paliperidone Other (See Comments)    Dystonia  . Latex Rash    ROS:  Review of Systems  Constitutional: Positive for appetite change. Negative for chills, fatigue and fever.  Eyes:  Negative for visual disturbance.  Respiratory: Negative for shortness of breath.   Cardiovascular: Negative for chest pain.  Gastrointestinal: Negative for abdominal pain, nausea and vomiting.  Genitourinary: Positive for vaginal pain. Negative for difficulty urinating, dysuria, flank pain, pelvic pain, vaginal bleeding and vaginal discharge.  Skin:       Breast lump right breast without pain   Neurological: Negative for dizziness and headaches.  Psychiatric/Behavioral: Negative.      I have reviewed patient's Past Medical Hx, Surgical Hx, Family Hx, Social Hx, medications and allergies.   Physical Exam   Patient Vitals for the past 24 hrs:  BP Temp Pulse Resp SpO2  01/11/21 2240 (!) 143/103 -- 76 -- --  01/11/21 2200 (!) 144/97 -- 87 -- --   01/11/21 2140 (!) 151/111 -- (!) 106 -- --  01/11/21 2137 (!) 176/132 -- -- -- --  01/11/21 2116 (!) 176/126 -- 69 -- --  01/11/21 2100 (!) 187/125 -- 81 -- --  01/11/21 2045 (!) 160/117 -- 81 -- --  01/11/21 2033 (!) 150/116 -- 79 -- --  01/11/21 2017 (!) 166/113 -- 78 -- --  01/11/21 1931 (!) 173/126 98.3 F (36.8 C) 89 15 99 %   Constitutional: Well-developed, well-nourished female in no acute distress.  HEART: normal rate, heart sounds, regular rhythm RESP: normal effort, lung sounds clear and equal bilaterally  Breast Exam:  Small, 0.5 cm smooth round mobile mass at edge of areola at 12 o'clock with no pain to palpation, no edema, no erythema  GI: Abd soft, non-tender. Pos BS x 4 MS: Extremities nontender, no edema, normal ROM Neurologic: Alert and oriented x 4.  GU: Neg CVAT.  PELVIC EXAM: On visual inspection, there is a single tiny lesion, flat, flesh toned without pain to palpation.  HSV swab collected.   LAB RESULTS Results for orders placed or performed during the hospital encounter of 01/11/21 (from the past 24 hour(s))  CBC     Status: None   Collection Time: 01/11/21  8:35 PM  Result Value Ref Range   WBC 5.4 4.0 - 10.5 K/uL   RBC 4.70 3.87 - 5.11 MIL/uL   Hemoglobin 14.0 12.0 - 15.0 g/dL   HCT 52.7 78.2 - 42.3 %   MCV 90.6 80.0 - 100.0 fL   MCH 29.8 26.0 - 34.0 pg   MCHC 32.9 30.0 - 36.0 g/dL   RDW 53.6 14.4 - 31.5 %   Platelets 251 150 - 400 K/uL   nRBC 0.0 0.0 - 0.2 %  Comprehensive metabolic panel     Status: None   Collection Time: 01/11/21  8:35 PM  Result Value Ref Range   Sodium 139 135 - 145 mmol/L   Potassium 3.5 3.5 - 5.1 mmol/L   Chloride 103 98 - 111 mmol/L   CO2 24 22 - 32 mmol/L   Glucose, Bld 97 70 - 99 mg/dL   BUN 6 6 - 20 mg/dL   Creatinine, Ser 4.00 0.44 - 1.00 mg/dL   Calcium 9.5 8.9 - 86.7 mg/dL   Total Protein 7.7 6.5 - 8.1 g/dL   Albumin 4.3 3.5 - 5.0 g/dL   AST 30 15 - 41 U/L   ALT 27 0 - 44 U/L   Alkaline Phosphatase 98 38  - 126 U/L   Total Bilirubin 1.1 0.3 - 1.2 mg/dL   GFR, Estimated >61 >95 mL/min   Anion gap 12 5 - 15    --/--/O POS (04/01 1822)  IMAGING No results  found.  MAU Management/MDM: Orders Placed This Encounter  Procedures  . CBC  . Comprehensive metabolic panel  . Herpes simplex virus (HSV), DNA by PCR Sterile Swab  . Notify Physician  . Measure blood pressure  . Discharge patient    Meds ordered this encounter  Medications  . AND Linked Order Group   . NIFEdipine (PROCARDIA) capsule 10 mg   . NIFEdipine (PROCARDIA) capsule 20 mg   . NIFEdipine (PROCARDIA) capsule 20 mg   . labetalol (NORMODYNE) injection 40 mg  . DISCONTD: NIFEdipine (PROCARDIA-XL/NIFEDICAL-XL) 30 MG 24 hr tablet    Sig: Take 1 tablet (30 mg total) by mouth daily.    Dispense:  30 tablet    Refill:  11    Order Specific Question:   Supervising Provider    Answer:   Lyndel Safe NILES [[id number]]    Pt stable, no s/s/x of PEC.  Severe range BPs upon arrival but pt CHTN and not taking medications at discharge. PEC labs all wnl.  PEC protocol initiated and Procardia 10 mg PO and 20 mg PO given.  BPs 140s/90s following these PO medications.  Offered to given Procardia XL here in MAU to pt but she reports that the Norvasc 10 is in her car so she can take it tonight.  D/C home with plan for pt to take Norvasc 10 mg daily as prescribed.  BP check at [facility] in 2 days, message sent, then postpartum visit with MD.  Preecautions/reasons to seek emergency care reviewed.    ASSESSMENT 1. Chronic hypertension in obstetric context, antepartum   2. Postpartum hypertension   3. Mass of breast, right   4. Vaginal lesion   5. At risk for postpartum depression     PLAN Discharge home Allergies as of 01/11/2021      Reactions   Paliperidone Other (See Comments)   Dystonia   Latex Rash      Medication List    STOP taking these medications   ibuprofen 600 MG tablet Commonly known as: ADVIL     TAKE these  medications   acetaminophen 325 MG tablet Commonly known as: Tylenol Take 2 tablets (650 mg total) by mouth every 4 (four) hours as needed (for pain scale < 4).   amLODipine 10 MG tablet Commonly known as: NORVASC Take 1 tablet (10 mg total) by mouth daily.   PNV-DHA 27-0.6-0.4-300 MG Caps Take 1 capsule by mouth daily.       Follow-up Information    [facility] Follow up.   Why: The office will call you with a blood pressure check visit and then a postpartum visit with a provider.   Contact information: [address] [place] [address] [phone]       [facility] Follow up.   Specialty: Obstetrics and Gynecology Why: Please return with any emergencies. Contact information: [address] [id number] mc [place] [address] [phone]              Sharen Counter Certified Nurse-Midwife 01/11/2021  11:11 PM

## 2021-01-12 ENCOUNTER — Inpatient Hospital Stay (HOSPITAL_COMMUNITY)
Admission: AD | Admit: 2021-01-12 | Discharge: 2021-01-12 | Disposition: A | Discharge status: Home / Self Care | Payer: Medicaid Other | Attending: Family Medicine | Admitting: Family Medicine

## 2021-01-12 ENCOUNTER — Telehealth: Payer: Self-pay | Admitting: Family Medicine

## 2021-01-12 DIAGNOSIS — F259 Schizoaffective disorder, unspecified: Secondary | ICD-10-CM | POA: Insufficient documentation

## 2021-01-12 DIAGNOSIS — F250 Schizoaffective disorder, bipolar type: Secondary | ICD-10-CM

## 2021-01-12 DIAGNOSIS — I10 Essential (primary) hypertension: Secondary | ICD-10-CM | POA: Diagnosis not present

## 2021-01-12 DIAGNOSIS — Z79899 Other long term (current) drug therapy: Secondary | ICD-10-CM | POA: Diagnosis not present

## 2021-01-12 DIAGNOSIS — Z87891 Personal history of nicotine dependence: Secondary | ICD-10-CM | POA: Insufficient documentation

## 2021-01-12 DIAGNOSIS — O1003 Pre-existing essential hypertension complicating the puerperium: Secondary | ICD-10-CM | POA: Diagnosis not present

## 2021-01-12 DIAGNOSIS — Z7901 Long term (current) use of anticoagulants: Secondary | ICD-10-CM | POA: Diagnosis not present

## 2021-01-12 DIAGNOSIS — F53 Postpartum depression: Secondary | ICD-10-CM | POA: Diagnosis not present

## 2021-01-12 DIAGNOSIS — O1493 Unspecified pre-eclampsia, third trimester: Secondary | ICD-10-CM

## 2021-01-12 DIAGNOSIS — N611 Abscess of the breast and nipple: Secondary | ICD-10-CM | POA: Diagnosis not present

## 2021-01-12 DIAGNOSIS — O99893 Other specified diseases and conditions complicating puerperium: Secondary | ICD-10-CM | POA: Diagnosis not present

## 2021-01-12 DIAGNOSIS — O99345 Other mental disorders complicating the puerperium: Secondary | ICD-10-CM | POA: Insufficient documentation

## 2021-01-12 DIAGNOSIS — O10919 Unspecified pre-existing hypertension complicating pregnancy, unspecified trimester: Secondary | ICD-10-CM

## 2021-01-12 MED ORDER — NIFEDIPINE 10 MG PO CAPS
10.0000 mg | ORAL_CAPSULE | Freq: Once | ORAL | Status: AC
  Administered 2021-01-12: 10 mg via ORAL
  Filled 2021-01-12: qty 1

## 2021-01-12 MED ORDER — NIFEDIPINE ER OSMOTIC RELEASE 30 MG PO TB24
60.0000 mg | ORAL_TABLET | Freq: Once | ORAL | Status: AC
  Administered 2021-01-12: 60 mg via ORAL
  Filled 2021-01-12: qty 2

## 2021-01-12 MED ORDER — OLANZAPINE 5 MG PO TABS: 5.0000 mg | ORAL_TABLET | Freq: Every day | ORAL | 5 refills | Status: DC

## 2021-01-12 MED ORDER — NIFEDIPINE ER OSMOTIC RELEASE 60 MG PO TB24: 60.0000 mg | ORAL_TABLET | Freq: Every day | ORAL | 5 refills | Status: DC

## 2021-01-12 NOTE — MAU Provider Note (Addendum)
Unable to edit Dr Audie Clear MAU note  We had to give her a single dose of short-acting Nifedipine 10mg  for significantly elevated Blood pressures before discharge home  Vitals:   01/12/21 2101 01/12/21 2136 01/12/21 2146 01/12/21 2201  BP: (!) 159/115 (!) 159/116 (!) 146/110 133/87  Pulse: 69  68 (!) 122  Resp:      Temp:      TempSrc:      SpO2:       Her BPs responded well to this dose Reviewed Rx for extended release Nifedipine for home use  Also had Rx sent for Olanzapine to start tonight.  Rx sent to 24 hr pharmacy  Patient discharged home.  01/14/21, CNM

## 2021-01-12 NOTE — Telephone Encounter (Signed)
Called and spoke to patient about her upcoming appointments

## 2021-01-12 NOTE — MAU Note (Signed)
Alyssa Mathis is a 35 y.o. here in MAU reporting: pt is PP s/p SVD. Pt is here to follow up on results from visit yesterday. Also is feeling like she may have PP depression, states her emotions have been all over the place with episodes of crying. States no thoughts of suicide.   Onset of complaint: ongoing  Pain score: 0/10  Vitals:   01/12/21 1818  BP: (!) 159/111  Pulse: 89  Resp: 16  Temp: 98.6 F (37 C)  SpO2: 98%     Lab orders placed from triage: none

## 2021-01-12 NOTE — MAU Provider Note (Signed)
History     [id number]  Arrival date and time: 01/12/21 1735    Chief Complaint  Patient presents with  . PP depression     HPI Alyssa Mathis is a 35 y.o. s/p NSVD on 12/17/2020 with PMHx notable for schizoaffective disorder, who presents for concern for PP depression.   Review of discharge summary from Psych admission at [facility] 11/07/2020: admitted for paranoia and mania, discharged on olanzapine  Review of discharge summary from delivery admission 12/17/2020: IOL for cHTN with superimposed severe PreE, received Mg, discharged on amlodipine.  Patient seen in MAU last night. Severe range BP's on arrival, given PO procardia with improvement in BP's. Offered Nifed XL but declined.   Patient reports she is presenting due to concerns for post partum depression. Her mood has been up and down, sometimes she's fine and sometimes she feels like crying. Her sister Maxine Glenn joined our conversation by phone, and reports she has also noticed that the patient has had difficulty caring for herself, is not showering. Neither patient or sister worried about her ability to care for the baby. Patient denies thoughts of harm to self or others. She denies auditory or visual hallucinations.   Patient very upset over her voluntary commitment earlier this year. Feels she was forced to take psychiatric medications. Was very worried about coming but knows she does not feel right and presented so that she would not get worse.   She denies any headache, vision changes, chest pain, SOB, RUQ pain, LE edema. She has not been taking her Amlodipine she was prescribed since she was discharged from the hospital.    --/--/O POS (04/01 1822)  OB History    Gravida  6   Para  4   Term  2   Preterm  2   AB  2   Living  4     SAB  2   IAB      Ectopic      Multiple  0   Live Births  4           Past Medical History:  Diagnosis Date  . Anemia   . Hypertension   . Pregnancy induced hypertension   .  Schizoaffective disorder [facility])     Past Surgical History:  Procedure Laterality Date  . NO PAST SURGERIES      Family History  Problem Relation Age of Onset  . Hypertension Sister   . Hypertension Maternal Grandmother   . Diabetes Maternal Grandfather   . Hypertension Maternal Grandfather     Social History   Socioeconomic History  . Marital status: Single    Spouse name: Not on file  . Number of children: Not on file  . Years of education: Not on file  . Highest education level: Not on file  Occupational History  . Not on file  Tobacco Use  . Smoking status: Former Games developer  . Smokeless tobacco: Never Used  Vaping Use  . Vaping Use: Not on file  Substance and Sexual Activity  . Alcohol use: Never  . Drug use: Not Currently  . Sexual activity: Yes  Other Topics Concern  . Not on file  Social History Narrative  . Not on file   Social Determinants of Health   Financial Resource Strain: Not on file  Food Insecurity: Not on file  Transportation Needs: Not on file  Physical Activity: Not on file  Stress: Not on file  Social Connections: Not on file  Intimate Partner Violence:  Not on file    Allergies  Allergen Reactions  . Paliperidone Other (See Comments)    Dystonia  . Latex Rash    No current facility-administered medications on file prior to encounter.   Current Outpatient Medications on File Prior to Encounter  Medication Sig Dispense Refill  . acetaminophen (TYLENOL) 325 MG tablet Take 2 tablets (650 mg total) by mouth every 4 (four) hours as needed (for pain scale < 4).    [name] amLODipine (NORVASC) 10 MG tablet Take 1 tablet (10 mg total) by mouth daily. 90 tablet 0  . Prenat w/o A-FE-Methfol-FA-DHA (PNV-DHA) 27-0.6-0.4-300 MG CAPS Take 1 capsule by mouth daily.       ROS Pertinent positives and negative per HPI, all others reviewed and negative  Physical Exam   BP (!) 152/107   Pulse 64   Temp 98.6 F (37 C) (Oral)   Resp 16   SpO2 98%  Comment: room air  Patient Vitals for the past 24 hrs:  BP Temp Temp src Pulse Resp SpO2  01/12/21 1916 (!) 152/107 -- -- 64 -- --  01/12/21 1833 (!) 163/106 -- -- 76 -- --  01/12/21 1818 (!) 159/111 98.6 F (37 C) Oral 89 16 98 %    Physical Exam Vitals reviewed.  Constitutional:      General: She is not in acute distress.    Appearance: She is well-developed. She is not diaphoretic.  Eyes:     General: No scleral icterus. Pulmonary:     Effort: Pulmonary effort is normal. No respiratory distress.  Skin:    General: Skin is warm and dry.  Neurological:     Mental Status: She is alert.     Coordination: Coordination normal.  Psychiatric:     Comments: Depressed mood, anxious affect. Occasionally tangential speech and thought process though overall thought process is coherent. No SI or HI. No AH/VH.      Cervical Exam    Bedside Ultrasound Not done  My interpretation: n/a  FHT N/a  Labs Results for orders placed or performed during the hospital encounter of 01/11/21 (from the past 24 hour(s))  CBC     Status: None   Collection Time: 01/11/21  8:35 PM  Result Value Ref Range   WBC 5.4 4.0 - 10.5 K/uL   RBC 4.70 3.87 - 5.11 MIL/uL   Hemoglobin 14.0 12.0 - 15.0 g/dL   HCT 11.9 14.7 - 82.9 %   MCV 90.6 80.0 - 100.0 fL   MCH 29.8 26.0 - 34.0 pg   MCHC 32.9 30.0 - 36.0 g/dL   RDW 56.2 13.0 - 86.5 %   Platelets 251 150 - 400 K/uL   nRBC 0.0 0.0 - 0.2 %  Comprehensive metabolic panel     Status: None   Collection Time: 01/11/21  8:35 PM  Result Value Ref Range   Sodium 139 135 - 145 mmol/L   Potassium 3.5 3.5 - 5.1 mmol/L   Chloride 103 98 - 111 mmol/L   CO2 24 22 - 32 mmol/L   Glucose, Bld 97 70 - 99 mg/dL   BUN 6 6 - 20 mg/dL   Creatinine, Ser 7.84 0.44 - 1.00 mg/dL   Calcium 9.5 8.9 - 69.6 mg/dL   Total Protein 7.7 6.5 - 8.1 g/dL   Albumin 4.3 3.5 - 5.0 g/dL   AST 30 15 - 41 U/L   ALT 27 0 - 44 U/L   Alkaline Phosphatase 98 38 - 126 U/L  Total  Bilirubin 1.1 0.3 - 1.2 mg/dL   GFR, Estimated >88 >50 mL/min   Anion gap 12 5 - 15    Imaging No results found.  MAU Course  Procedures Lab Orders  No laboratory test(s) ordered today   Meds ordered this encounter  Medications  . NIFEdipine (PROCARDIA-XL/NIFEDICAL-XL) 24 hr tablet 60 mg  . NIFEdipine (PROCARDIA XL/NIFEDICAL XL) 60 MG 24 hr tablet    Sig: Take 1 tablet (60 mg total) by mouth daily.    Dispense:  30 tablet    Refill:  5  . OLANZapine (ZYPREXA) 5 MG tablet    Sig: Take 1 tablet (5 mg total) by mouth at bedtime.    Dispense:  30 tablet    Refill:  5   Imaging Orders  No imaging studies ordered today    MDM moderate  Assessment and Plan  #Postpartum Depression #Schizoaffective disorder Patient presenting with report of worsening mood symptoms in setting of significant psychiatric history notable for recent admission at [facility] two months prior. At this time patient is not a danger to self or others and is actively seeking help. I discussed case with on call psychiatrist Dr. Lolly Mustache for assistance on ideal medication management. He recommended resuming Olanzapine at a lower dose of 5 mg. I discussed with patient, though she is a little hesitant she is ok with it "since I'm coming to it myself and not being forced." I also recommended intensive outpatient [facility] program, per Dr. Lolly Mustache best option would be through [facility] services, patient given number as well as address to establish either IOP or PHP.   #cHTN Poorly controlled BP's however almost four weeks PP and asymptomatic, no concern for ongoing pre-eclampsia. Labs from visit to MAU yesterday also normal. Recommended she discontinue Amlodipine given long onset of action and switch to Nifedipine 60mg  XL, she was given a dose of this prior to discharge and a script was sent to her pharmacy. Required a few doses of short acting procardia to bring blood pressures under control as well.  Discharged to home in stable  condition.   , MD/MPH 01/12/21 8:29 PM  Allergies as of 01/12/2021      Reactions   Paliperidone Other (See Comments)   Dystonia   Latex Rash      Medication List    STOP taking these medications   amLODipine 10 MG tablet Commonly known as: NORVASC     TAKE these medications   acetaminophen 325 MG tablet Commonly known as: Tylenol Take 2 tablets (650 mg total) by mouth every 4 (four) hours as needed (for pain scale < 4).   NIFEdipine 60 MG 24 hr tablet Commonly known as: PROCARDIA XL/NIFEDICAL XL Take 1 tablet (60 mg total) by mouth daily.   OLANZapine 5 MG tablet Commonly known as: ZyPREXA Take 1 tablet (5 mg total) by mouth at bedtime.   PNV-DHA 27-0.6-0.4-300 MG Caps Take 1 capsule by mouth daily.

## 2021-01-12 NOTE — Discharge Instructions (Signed)
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 We are going to restart your Olanzapine at 5 mg nightly.  We also recommend you start outpatient therapy with [facility]. You can call them to set up an appointment at [phone]. You can also go directly to their location at [address], [place], [place] [address].  We are also switching your blood pressure medications. Stop taking Amlodipine, start taking Nifedipine 60 mg XL.  Perinatal Depression When a woman feels excessive sadness, anger, or anxiety during pregnancy or during the first 12 months after she gives birth, she has a condition called perinatal depression. This can interfere with work, school, relationships, and other everyday activities. If it is not managed properly, it can also interfere with the woman's ability to take care of the baby. Symptoms of perinatal depression may feel worse when living with a newborn. Sometimes, these symptoms are left untreated because they are thought to be normal mood swings during and right after pregnancy. However, if you have intense symptoms of depression that last for more than 2 weeks, it is important to talk with your health care provider. This may be perinatal depression. What are the causes? The exact cause of this condition is not known. Hormonal changes during and after pregnancy may play a role in causing perinatal depression. What increases the risk? You are more likely to develop this condition if:  You have a personal or family history of depression, anxiety, or mood disorders.  You experience a stressful life event during pregnancy, such as the death of a loved one.  You have additional life stress, such as being a single parent.  You do not have support from family members or loved ones, or you are in an abusive relationship.  You have thyroid problems. What are the signs or symptoms? Symptoms of this condition include:  Emotional symptoms,  such as: ? Feeling sad or hopeless. ? Feelings of guilt. ? Feeling irritable or overwhelmed.  Physical symptoms, such as: ? Changes with appetite or sleep. ? Lack of energy or motivation. ? Persistent headaches or stomach problems.  Behavioral symptoms, such as: ? Difficulty concentrating or completing tasks. ? Loss of interest in hobbies or relationships. How is this diagnosed? This condition is diagnosed based on a physical exam and mental evaluation.  In some cases, your health care provider may use a depression screening tool. This includes a list of questions that can help a health care provider diagnose depression.  You may be referred to a mental health expert who specializes in treating perinatal depression. How is this treated? This condition may be treated with:  Talk therapy with a mental health professional. This may be interpersonal psychotherapy, couples therapy, cognitive behavioral therapy, or mother-child bonding therapy.  Medicines. Your health care provider will discuss the safety of the medicines prescribed during pregnancy and breastfeeding.  Support groups.  Brain stimulation or light therapies.  Stress reduction therapies, such as mindfulness.   Follow these instructions at home: Lifestyle  Do not use any products that contain nicotine or tobacco. These products include cigarettes, chewing tobacco, and vaping devices, such as e-cigarettes. If you need help quitting, ask your health care provider.  Do not drink alcohol when you are pregnant. It is also safest not to drink alcohol if you are breastfeeding.  After your baby is born, if you drink alcohol: ? Limit how much you have to 0-1 drink a day. ? Be aware of how much alcohol is in  your drink. In the [place] one drink equals one 12 oz bottle of beer (355 mL), one 5 oz glass of wine (148 mL), or one 1 oz glass of hard liquor (44 mL).  Consider joining a support group for new mothers. Ask your health  care provider for recommendations.  Take good care of yourself. Make sure you: ? Get as much sleep as possible. Talk with your partner about sharing the responsibility of getting up with your baby if possible and sharing child care responsibilities equally. Make sleep a priority. ? Eat a healthy diet. This includes plenty of fruits and vegetables, whole grains, and lean proteins. ? Exercise regularly, as told by your health care provider. Ask your health care provider what exercises are safe for you. Talk with your partner about making sure you both have opportunities to exercise. General instructions  Take over-the-counter and prescription medicines only as told by your health care provider.  Talk with your partner or family members about your feelings during pregnancy. Share any concerns, needs, or anxieties that you may have. Do not be afraid to ask for help. Find a mental health professional, if needed.  Ask for help with tasks or chores when you need it. Ask friends and family members to provide meals, watch your children, or help with cleaning.  Keep all follow-up visits. This is important. Contact a health care provider if:  You or people close to you notice that you have symptoms of depression.  Your symptoms of depression get worse.  You take medicines and have side effects, such as nausea or sleep problems. Get help right away if:  You feel like hurting yourself, your baby, or someone else. If you feel like you may hurt yourself or others, or have thoughts about taking your own life, get help right away. You can go to your nearest emergency department or:  Call your local emergency services (911 in the [place]  Call a suicide crisis helpline, such as the National Suicide Prevention Lifeline, at 785-783-3560. This is open 24 hours a day in the [place]  Text the Crisis Text Line at (903)253-1996 (in the [place] Summary  Perinatal depression is when a woman feels excessive sadness,  anger, or anxiety during pregnancy or during the first 12 months after she gives birth.  If perinatal depression is not managed properly, it can interfere with the woman's ability to take care of the baby.  This condition is treated with medicines, talk therapy, stress reduction therapies, or a combination of treatments.  Talk with your partner or family members about your feelings. Ask for help when you need it. This information is not intended to replace advice given to you by your health care provider. Make sure you discuss any questions you have with your health care provider. Document Revised: 02/26/2020 Document Reviewed: 02/26/2020 Elsevier Patient Education  2021 [employer].

## 2021-01-13 ENCOUNTER — Encounter (HOSPITAL_COMMUNITY): Payer: Self-pay | Admitting: *Deleted

## 2021-01-13 ENCOUNTER — Other Ambulatory Visit: Payer: Self-pay

## 2021-01-13 ENCOUNTER — Ambulatory Visit: Payer: Medicaid Other

## 2021-01-13 ENCOUNTER — Telehealth: Payer: Self-pay | Admitting: *Deleted

## 2021-01-13 ENCOUNTER — Emergency Department (HOSPITAL_COMMUNITY)
Admission: EM | Admit: 2021-01-13 | Discharge: 2021-01-16 | Payer: No Typology Code available for payment source | Attending: Emergency Medicine | Admitting: Emergency Medicine

## 2021-01-13 DIAGNOSIS — F259 Schizoaffective disorder, unspecified: Secondary | ICD-10-CM

## 2021-01-13 DIAGNOSIS — Z87891 Personal history of nicotine dependence: Secondary | ICD-10-CM | POA: Insufficient documentation

## 2021-01-13 DIAGNOSIS — F250 Schizoaffective disorder, bipolar type: Secondary | ICD-10-CM | POA: Diagnosis not present

## 2021-01-13 DIAGNOSIS — Z91040 Latex allergy status: Secondary | ICD-10-CM | POA: Insufficient documentation

## 2021-01-13 DIAGNOSIS — I10 Essential (primary) hypertension: Secondary | ICD-10-CM | POA: Diagnosis not present

## 2021-01-13 DIAGNOSIS — F329 Major depressive disorder, single episode, unspecified: Secondary | ICD-10-CM | POA: Diagnosis present

## 2021-01-13 DIAGNOSIS — F53 Postpartum depression: Secondary | ICD-10-CM | POA: Diagnosis present

## 2021-01-13 DIAGNOSIS — Z20822 Contact with and (suspected) exposure to covid-19: Secondary | ICD-10-CM | POA: Insufficient documentation

## 2021-01-13 HISTORY — DX: Postpartum depression: F53.0

## 2021-01-13 LAB — CBC WITH DIFFERENTIAL/PLATELET
Abs Immature Granulocytes: 0.02 10*3/uL (ref 0.00–0.07)
Basophils Absolute: 0.1 10*3/uL (ref 0.0–0.1)
Basophils Relative: 1 %
Eosinophils Absolute: 0.1 10*3/uL (ref 0.0–0.5)
Eosinophils Relative: 1 %
HCT: 43.1 % (ref 36.0–46.0)
Hemoglobin: 14.1 g/dL (ref 12.0–15.0)
Immature Granulocytes: 0 %
Lymphocytes Relative: 50 %
Lymphs Abs: 2.7 10*3/uL (ref 0.7–4.0)
MCH: 29.8 pg (ref 26.0–34.0)
MCHC: 32.7 g/dL (ref 30.0–36.0)
MCV: 91.1 fL (ref 80.0–100.0)
Monocytes Absolute: 0.5 10*3/uL (ref 0.1–1.0)
Monocytes Relative: 10 %
Neutro Abs: 2 10*3/uL (ref 1.7–7.7)
Neutrophils Relative %: 38 %
Platelets: 224 10*3/uL (ref 150–400)
RBC: 4.73 MIL/uL (ref 3.87–5.11)
RDW: 11.6 % (ref 11.5–15.5)
WBC: 5.4 10*3/uL (ref 4.0–10.5)
nRBC: 0 % (ref 0.0–0.2)

## 2021-01-13 LAB — COMPREHENSIVE METABOLIC PANEL
ALT: 27 U/L (ref 0–44)
AST: 22 U/L (ref 15–41)
Albumin: 4.1 g/dL (ref 3.5–5.0)
Alkaline Phosphatase: 81 U/L (ref 38–126)
Anion gap: 9 (ref 5–15)
BUN: 11 mg/dL (ref 6–20)
CO2: 25 mmol/L (ref 22–32)
Calcium: 9.3 mg/dL (ref 8.9–10.3)
Chloride: 105 mmol/L (ref 98–111)
Creatinine, Ser: 0.98 mg/dL (ref 0.44–1.00)
GFR, Estimated: >60 mL/min (ref >60)
Glucose, Bld: 86 mg/dL (ref 70–99)
Potassium: 3.4 mmol/L — ABNORMAL LOW (ref 3.5–5.1)
Sodium: 139 mmol/L (ref 135–145)
Total Bilirubin: 0.9 mg/dL (ref 0.3–1.2)
Total Protein: 7.2 g/dL (ref 6.5–8.1)

## 2021-01-13 LAB — RESP PANEL BY RT-PCR (FLU A&B, COVID) ARPGX2
Influenza A by PCR: NEGATIVE
Influenza B by PCR: NEGATIVE
SARS Coronavirus 2 by RT PCR: NEGATIVE

## 2021-01-13 LAB — ETHANOL: Alcohol, Ethyl (B): <10 mg/dL (ref <10)

## 2021-01-13 LAB — SALICYLATE LEVEL: Salicylate Lvl: <7 mg/dL — ABNORMAL LOW (ref 7.0–30.0)

## 2021-01-13 LAB — ACETAMINOPHEN LEVEL: Acetaminophen (Tylenol), Serum: <10 ug/mL — ABNORMAL LOW (ref 10–30)

## 2021-01-13 LAB — CBG MONITORING, ED: Glucose-Capillary: 89 mg/dL (ref 70–99)

## 2021-01-13 MED ORDER — ACETAMINOPHEN 325 MG PO TABS: 650.0000 mg | ORAL_TABLET | ORAL | Status: DC | PRN

## 2021-01-13 MED ORDER — POTASSIUM CHLORIDE CRYS ER 20 MEQ PO TBCR
40.0000 meq | EXTENDED_RELEASE_TABLET | Freq: Once | ORAL | Status: AC
  Administered 2021-01-13: 40 meq via ORAL
  Filled 2021-01-13: qty 2

## 2021-01-13 MED ORDER — NIFEDIPINE ER OSMOTIC RELEASE 60 MG PO TB24
60.0000 mg | ORAL_TABLET | Freq: Every day | ORAL | Status: DC
  Administered 2021-01-16: 60 mg via ORAL
  Filled 2021-01-13 (×5): qty 1

## 2021-01-13 MED ORDER — OLANZAPINE 5 MG PO TABS
5.0000 mg | ORAL_TABLET | Freq: Every day | ORAL | Status: DC
  Administered 2021-01-14 – 2021-01-15 (×2): 5 mg via ORAL
  Filled 2021-01-13 (×6): qty 1

## 2021-01-13 NOTE — ED Notes (Signed)
Pt attempting to find caregiver for newborn at bedside

## 2021-01-13 NOTE — Telephone Encounter (Signed)
Pt did not keep appt today for BP check following visit to [facility] yesterday. Per request of Dr. Crissie Reese, I called pt to inquire about her status and attempt to reschedule appt for next week. I heard outgoing message stating that the call could not be completed @ this time. I was not able to leave a voicemail. Mychart message could not be sent as pt does not have an account set up.

## 2021-01-13 NOTE — ED Notes (Signed)
Pt scared to give out information to family, difficulty finding someone to pick up newborn from emergency room. Pt does not trust newborns father.

## 2021-01-13 NOTE — ED Notes (Signed)
Patient up to the nursing station and holding sleeping infant. Patient rambling about her children's father not having access to her medical information and that Dr Su Hilt called him. Patient assured that there are no Dr Su Hilt on for the ED, but that perhaps case management may have reached out. Patient suspicious and unable to be distracted by talking about her other children. Concerned when patient started to get more agitated about her suspicions and asked to hold baby. Patient had RN sanitize hands a few times, but then went back into the room with the baby still sleeping. Patient back to the nursing station a few moments later.Asked how we can help and if she got a dinner tray. Patient said she didn't know who made the tray and then she said "Francesca Oman can be who you wanna be can't you. " Patient seemingly jumping topics. "They trying to play like I'm crazy. I don't wanna talk about none of that. " Patient with facial expressions that are very incongruent to her mood.

## 2021-01-13 NOTE — ED Notes (Signed)
[name], father of her children, [phone] would like a call back asap, he is concerned about the daughter this pt brought with her to the hospital

## 2021-01-13 NOTE — ED Notes (Signed)
Security wanding pt.   

## 2021-01-13 NOTE — ED Notes (Signed)
MD at bedside. 

## 2021-01-13 NOTE — ED Notes (Signed)
Carrie rn attemtpting to set up tts, pt requesting discharge,
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
## 2021-01-13 NOTE — ED Provider Notes (Signed)
[facility] EMERGENCY DEPARTMENT Provider Note   CSN: [id number] Arrival date & time: 01/13/21  1431     History Chief Complaint  Patient presents with  . Altered Mental Status    Alyssa Mathis is a 35 y.o. female.  HPI 35 year old female presents via EMS after [employer] helped her in a parking lot.  The patient is about 1 month postpartum and last night went to the [facility]U for concern for postpartum depression.  She was started on Zyprexa, which she first took last night around 1 AM.  She did not realize it would cause her to get this sleepy.  She was supposed to go get reevaluated for blood pressure but could remember where she was try to go and when she could not remember she asked [employer] for help and they were concerned and asked EMS to bring her to the hospital.  The patient does state that she has had some crying and depression but no suicidal or homicidal thoughts.  Denies hallucinations.  She denies any pain or illness otherwise. She feels like she's dehydrated because she has not eaten or drank today.   Past Medical History:  Diagnosis Date  . Anemia   . Hypertension   . Post partum depression   . Pregnancy induced hypertension   . Schizoaffective disorder [facility])     Patient Active Problem List   Diagnosis Date Noted  . Postpartum depression 01/12/2021  . Insufficient prenatal care 12/18/2020  . Preeclampsia, third trimester 12/17/2020  . Chronic hypertension affecting pregnancy 12/16/2020  . 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 History of preterm delivery 08/08/2020  . History of pre-eclampsia 08/03/2020  . Schizoaffective disorder, bipolar type (HCC) 06/28/2020  . Subchorionic hematoma in first trimester 06/28/2020  . Schizoaffective disorder (HCC) 06/27/2020  . Chronic hypertension 06/27/2020  . Supervision of high risk pregnancy in second trimester 06/16/2020  . Poor compliance 03/03/2018  . Oligohydramnios antepartum, third trimester 03/03/2018  . Spontaneous  vaginal delivery 03/03/2018  . Chronic hypertension with superimposed severe preeclampsia 03/01/2018    Past Surgical History:  Procedure Laterality Date  . NO PAST SURGERIES       OB History    Gravida  6   Para  4   Term  2   Preterm  2   AB  2   Living  4     SAB  2   IAB      Ectopic      Multiple  0   Live Births  4           Family History  Problem Relation Age of Onset  . Hypertension Sister   . Hypertension Maternal Grandmother   . Diabetes Maternal Grandfather   . Hypertension Maternal Grandfather     Social History   Tobacco Use  . Smoking status: Former Games developer  . Smokeless tobacco: Never Used  Substance Use Topics  . Alcohol use: Never  . Drug use: Not Currently    Home Medications Prior to Admission medications   Medication Sig Start Date End Date Taking? Authorizing Provider  acetaminophen (TYLENOL) 325 MG tablet Take 2 tablets (650 mg total) by mouth every 4 (four) hours as needed (for pain scale < 4). 12/18/20  Yes Raymond Bing, MD  NIFEdipine (PROCARDIA XL/NIFEDICAL XL) 60 MG 24 hr tablet Take 1 tablet (60 mg total) by mouth daily. 01/12/21  Yes Venora Maples, MD  OLANZapine (ZYPREXA) 5 MG tablet Take 1 tablet (5 mg total) by  mouth at bedtime. 01/12/21  Yes Venora Maples, MD  Prenat w/o A-FE-Methfol-FA-DHA (PNV-DHA) 27-0.6-0.4-300 MG CAPS Take 1 capsule by mouth daily. Patient not taking: No sig reported 06/02/20 06/02/21  [provider]    Allergies    Paliperidone and Latex  Review of Systems   Review of Systems  Constitutional: Negative for fever.  Cardiovascular: Negative for chest pain.  Gastrointestinal: Negative for abdominal pain and vomiting.  Neurological: Negative for headaches.  Psychiatric/Behavioral: Negative for suicidal ideas.  All other systems reviewed and are negative.   Physical Exam Updated Vital Signs BP 127/88 (BP Location: Right Arm)   Pulse 67   Temp 99.2 F (37.3 C) (Oral)    Resp 18   Ht 5\' 5"  (1.651 m)   Wt 52.2 kg   SpO2 99%   BMI 19.14 kg/m   Physical Exam Vitals and nursing note reviewed.  Constitutional:      Appearance: She is well-developed.  HENT:     Head: Normocephalic and atraumatic.     Right Ear: External ear normal.     Left Ear: External ear normal.     Nose: Nose normal.  Eyes:     General:        Right eye: No discharge.        Left eye: No discharge.     Extraocular Movements: Extraocular movements intact.  Cardiovascular:     Rate and Rhythm: Normal rate and regular rhythm.     Heart sounds: Normal heart sounds.  Pulmonary:     Effort: Pulmonary effort is normal.     Breath sounds: Normal breath sounds.  Abdominal:     Palpations: Abdomen is soft.     Tenderness: There is no abdominal tenderness.  Skin:    General: Skin is warm and dry.  Neurological:     Mental Status: She is alert.     Comments: Awake, alert, oriented to person, place, month, year. Missed the day of week and date, but was close. CN 3-12 grossly intact. 5/5 strength in all 4 extremities. Grossly normal sensation. Normal finger to nose.   Psychiatric:        Mood and Affect: Mood is not anxious.        Thought Content: Thought content does not include homicidal or suicidal ideation.     Comments: Mildly tangential     ED Results / Procedures / Treatments   Labs (all labs ordered are listed, but only abnormal results are displayed) Labs Reviewed  COMPREHENSIVE METABOLIC PANEL - Abnormal; Notable for the following components:      Result Value   Potassium 3.4 (*)    All other components within normal limits  ACETAMINOPHEN LEVEL - Abnormal; Notable for the following components:   Acetaminophen (Tylenol), Serum <10 (*)    All other components within normal limits  SALICYLATE LEVEL - Abnormal; Notable for the following components:   Salicylate Lvl <7.0 (*)    All other components within normal limits  RESP PANEL BY RT-PCR (FLU A&B, COVID) [id number]   ETHANOL  CBC WITH DIFFERENTIAL/PLATELET  RAPID URINE DRUG SCREEN, HOSP PERFORMED  URINALYSIS, ROUTINE W REFLEX MICROSCOPIC  CBG MONITORING, ED    EKG EKG Interpretation  Date/Time:  Friday January 13 2021 15:50:29 EDT Ventricular Rate:  59 PR Interval:  108 QRS Duration: 90 QT Interval:  478 QTC Calculation: 474 R Axis:   78 Text Interpretation: Sinus rhythm Short PR interval LVH by voltage Abnormal T, consider ischemia, anterior leads  No old tracing to compare Confirmed by Pricilla Loveless [phone]) on 01/13/2021 4:04:16 PM   Radiology No results found.  Procedures Procedures   Medications Ordered in ED Medications  potassium chloride SA (KLOR-CON) CR tablet 40 mEq (40 mEq Oral Given 01/13/21 1800)    ED Course  I have reviewed the triage vital signs and the nursing notes.  Pertinent labs & imaging results that were available during my care of the patient were reviewed by me and considered in my medical decision making (see chart for details).    MDM Rules/Calculators/A&P                          The patient is hemodynamically stable here.  She seems to have some mild underlying psychiatric disease driving some of her symptoms but she is not frankly psychotic.  She is not suicidal or homicidal.  TTS has seen and at this point is recommending overnight observation. Final Clinical Impression(s) / ED Diagnoses Final diagnoses:  Schizoaffective disorder, unspecified type [facility])    Rx / DC Orders ED Discharge Orders    None       Pricilla Loveless, MD 01/13/21 2258

## 2021-01-13 NOTE — ED Notes (Signed)
Pt speaking with TTS 

## 2021-01-13 NOTE — ED Triage Notes (Signed)
Patient presents to ed via [facility] states she was seen at [facility] last pm  For p/p depression and given RX zyprexia , she took it this am and was driving noticed she was getting sleepy and pulled over, states she saw [employer] and ems was called, Patient has her 15month old infant with her , patient wanted her sister called of which I did and the sister ( [phone] stated she wouldn't be able to help her.

## 2021-01-13 NOTE — ED Notes (Signed)
Pt refusing zyprexa because it makes her sleepy.

## 2021-01-13 NOTE — ED Notes (Signed)
This rn spoke with tts, pt made priority and to be called soon.

## 2021-01-13 NOTE — ED Triage Notes (Signed)
Emergency Medicine Provider Triage Evaluation Note  Alyssa Mathis , a 35 y.o. female  was evaluated in triage.  Pt complains of drowsiness. 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 There is a level 5 caveat as patient seems confused. She is here with her newborn baby who appears in no distress.   Review of Systems  Positive: drowsiness Negative: fever  Physical Exam  There were no vitals taken for this visit. Gen:   Awake, no distress   HEENT:  Atraumatic  Resp:  Normal effort  Cardiac:  Normal rate  Abd:   Nondistended, nontender  MSK:   Moves extremities without difficulty  Neuro:  Speech clear, pt seems confused but does not have any focal deficits  Medical Decision Making  Medically screening exam initiated at 2:32 PM.  Appropriate orders placed.  Alyssa Mathis was informed that the remainder of the evaluation will be completed by another provider, this initial triage assessment does not replace that evaluation, and the importance of remaining in the ED until their evaluation is complete.  Clinical Impression   Nursing made aware that patient needs room immediately due to concern for pt and child safety.  MSE was initiated and I personally evaluated the patient and placed orders (if any) at  2:37 PM on January 13, 2021.  The patient appears stable so that the remainder of the MSE may be completed by another provider.    Karrie Meres, [place] 01/13/21 1437

## 2021-01-14 ENCOUNTER — Other Ambulatory Visit: Payer: Self-pay

## 2021-01-14 DIAGNOSIS — F250 Schizoaffective disorder, bipolar type: Secondary | ICD-10-CM

## 2021-01-14 LAB — URINALYSIS, ROUTINE W REFLEX MICROSCOPIC
Bacteria, UA: NONE SEEN
Bilirubin Urine: NEGATIVE
Glucose, UA: NEGATIVE mg/dL
Hgb urine dipstick: NEGATIVE
Ketones, ur: 5 mg/dL — AB
Leukocytes,Ua: NEGATIVE
Nitrite: NEGATIVE
Protein, ur: 30 mg/dL — AB
Specific Gravity, Urine: 1.029 (ref 1.005–1.030)
pH: 6 (ref 5.0–8.0)

## 2021-01-14 LAB — RAPID URINE DRUG SCREEN, HOSP PERFORMED
Amphetamines: NOT DETECTED
Barbiturates: NOT DETECTED
Benzodiazepines: NOT DETECTED
Cocaine: NOT DETECTED
Opiates: NOT DETECTED
Tetrahydrocannabinol: NOT DETECTED

## 2021-01-14 LAB — HSV DNA BY PCR (REFERENCE LAB)
HSV 1 DNA: NEGATIVE
HSV 2 DNA: NEGATIVE

## 2021-01-14 MED ORDER — DIPHENHYDRAMINE HCL 50 MG/ML IJ SOLN
50.0000 mg | Freq: Two times a day (BID) | INTRAMUSCULAR | Status: DC | PRN
  Administered 2021-01-15: 50 mg via INTRAMUSCULAR
  Administered 2021-01-16: 25 mg via INTRAMUSCULAR
  Administered 2021-01-16: 50 mg via INTRAMUSCULAR
  Filled 2021-01-14 (×3): qty 1

## 2021-01-14 MED ORDER — ZIPRASIDONE MESYLATE 20 MG IM SOLR
10.0000 mg | Freq: Two times a day (BID) | INTRAMUSCULAR | Status: DC | PRN
  Administered 2021-01-15 – 2021-01-16 (×2): 10 mg via INTRAMUSCULAR
  Filled 2021-01-14 (×3): qty 20
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
## 2021-01-14 NOTE — ED Notes (Signed)
Assuming care. Pt laying in stretcher w/ eyes closed, RR e/u on RA.   Bed low and locked.

## 2021-01-14 NOTE — ED Notes (Signed)
Pt still asleep and resting peacefully. Will get 2 pm vital signs once pt is awake.

## 2021-01-14 NOTE — ED Notes (Signed)
Pt out of room pacing the floor. Pt informed by this tech she need to go back into her room. Pt continued to pace the hallway looking down at the floor and her fingers. Pt redirected by this tech again to go back into her room. Pt then stood still and did not move and stated "I'm not doing nothing wrong". This tech informed the pt that she still would have to go back into her room, she can not walk around the nursing station. Pt refused to go back into the room, security called.

## 2021-01-14 NOTE — ED Notes (Signed)
Pt teary eyed, not trusting of staff. Pt crying reporting shes scared she will be IVCd and never see her baby again. Pt educated for need to see psychiatry and updated on wait times. Pt at this time is willing to stay for continued treatment.

## 2021-01-14 NOTE — ED Notes (Addendum)
Pt is pacing out in the hallway, NT & this RN has attempted to get pt in her room & she has not acknowledged we are even speaking to her. No aggressive behaviors noted, just not wanting to stay in her room.

## 2021-01-14 NOTE — ED Notes (Signed)
Pt stating the NP on the TTS screen was not the right one she needed to speak to & said it was evil spirits. She began to imply that she would not speak to her, NP spoke to pt & pt began responding, this RN left the room for them to have privacy. Pt came out & said the screen was too dark & she could not see her.

## 2021-01-14 NOTE — ED Notes (Signed)
Pt was informed she needs to head the advice of not yelling & threatening staff or she could have a chance of being asked to leave since she is voluntary or getting committed. Pt looked and this RN, did not speak, walked into her room & began singing.

## 2021-01-14 NOTE — ED Notes (Signed)
Alyssa Mathis [phone], newborns father not answering phone calls. Unknown status of whether he is coming to pick up his child.

## 2021-01-14 NOTE — Progress Notes (Signed)
Per Ophelia Shoulder, NP, patient meets criteria for inpatient treatment. There are no available or appropriate beds at [facility] today. CSW faxed referrals to the following facilities for review:  [facility]  TTS will continue to seek bed placement.  Crissie Reese, MSW, LCSW-A, LCAS-A Phone: [phone] Disposition/TOC

## 2021-01-14 NOTE — ED Notes (Signed)
EDP was approached on behalf of pt yelling at NT continuously & referring to her as satan saying "I'm coming to get you." security at bedside. Pt not cooperating, pacing back and forth.

## 2021-01-14 NOTE — ED Notes (Signed)
Pt is asleep, light was turned off and door was slightly left ajar to encourage a more restful environment.

## 2021-01-14 NOTE — Progress Notes (Signed)
   01/14/21 1727  Clinical Encounter Type  Visited With Patient  Visit Type Spiritual support;Initial;Social support;ED  Referral From Nurse  Consult/Referral To Chaplain   Chaplain responded to page from [facility] Bridge for a Bible. Pt appeared to have trouble deciding when asked if she needed a large-print Bible, so Chaplain left regular and large print. Chaplain engaged active listening and provided spiritual and emotional support. Pt appeared to have difficulty gathering her thoughts and/or words to answer questions. Pt did talk about forgiveness. When asked if she wanted prayer, Pt motioned for Chaplain to be quiet. It appeared that she was occupied by her own thoughts. Chaplain left to respond to other calls but remains available.   This note was prepared by Chaplain Resident, Tacy Learn, MDiv. Chaplain remains available as needed through the on-call pager: [phone].

## 2021-01-14 NOTE — ED Notes (Signed)
Pt at desk speaking to staff, starting to verbalize things that are bothering her. Very polite and tearful. Repeating "I'm sorry" in various formats.

## 2021-01-14 NOTE — ED Notes (Signed)
Pt asked for a Bible, Chaplain was contacted & is at bedside delivering it.

## 2021-01-14 NOTE — ED Notes (Signed)
Per report from nurse leaving the assignment, pt has been up all night and finally went to sleep right before shift change. This tech will get vitals on pt once she is woke.

## 2021-01-14 NOTE — ED Notes (Signed)
Pt's mother called to speak with her & when RN was approached by secretary to ask pt if she wanted to speak with her pt stated "that's not my mother." Mother was told "Now is not a good time, but she will be able to call you back if she choses." Pt remains standing up at the nurse desk very slow to move & timid, tearful and talkative about feeling sorry. Flight of ideas noted, very difficult for pt to stay on subject.

## 2021-01-14 NOTE — ED Provider Notes (Signed)
Emergency Medicine Observation Re-evaluation Note  Alyssa Mathis is a 35 y.o. female, seen on rounds today.  Pt initially presented to the ED for complaints of anxiety and depressed mood. Recent NSVD. Pt reports other recent stressor.   Physical Exam  BP (!) 125/91 (BP Location: Right Arm)   Pulse 84   Temp 98.3 F (36.8 C) (Oral)   Resp 16   Ht 1.651 m (5\' 5" )   Wt 52.2 kg   SpO2 100%   BMI 19.14 kg/m  Physical Exam General: alert, content Cardiac: regular rate Lungs: breathing comfortably Psych: calm, alert. Normal mood/affect.   ED Course / MDM  EKG:EKG Interpretation  Date/Time:  Friday January 13 2021 15:50:29 EDT Ventricular Rate:  59 PR Interval:  108 QRS Duration: 90 QT Interval:  478 QTC Calculation: 474 R Axis:   78 Text Interpretation: Sinus rhythm Short PR interval LVH by voltage Abnormal T, consider ischemia, anterior leads No old tracing to compare Confirmed by [id number] [phone]) on 01/13/2021 4:04:16 PM   I have reviewed the labs performed to date as well as medications administered while in observation.  Recent changes in the last 24 hours include BH reassessment and stabilization on meds. .  Plan  On review yesterday's notes, it appears pt interested in getting back on meds and outpatient behavioral health follow up.  Pt remains in ED currently.   BH team reassessment and recommendations are pending. ?possible IOP or other close outpt [facility] plan.  The patient has been placed in psychiatric observation due to the need to provide a safe environment for the patient while obtaining psychiatric consultation and evaluation, as well as ongoing medical and medication management to treat the patient's condition.      [facility], MD 01/14/21 1125

## 2021-01-14 NOTE — ED Notes (Signed)
Pt is cooperating at this time. After security talking to her & this RN walking her int her room after re-heating her lunch tray for her. She is now sitting on her bed eating. 3 times the pt tried to get this RN's attention by starting to ask for something but could never recall what she was trying to say.

## 2021-01-14 NOTE — ED Notes (Signed)
Pt continues to pace from her rooms door at Rm#41 to the nurse desk, back & forth. Calm at the moment, not making eye contact with anyone.

## 2021-01-14 NOTE — ED Notes (Addendum)
This RN spoke with Arvilla Market, NP regarding pt & was informed that she does meet inpatient criteria now & the pt was very disorganized, & presents psychotic in nature during the short interaction on TTS until the pt began refusing to interact and turned against the wall.

## 2021-01-14 NOTE — ED Notes (Signed)
Belongings inventoried but remain at bedside, pt is scared of being ivcd and is refusing to give up belongings.

## 2021-01-14 NOTE — ED Notes (Signed)
Pt informed repeated to go back into room. But continues to walk around not responding to staff, just looking into air. Security called

## 2021-01-14 NOTE — Consult Note (Signed)
Telepsych Consultation   Reason for Consult:   Referring Physician:  EDP Location of Patient: [facility] Location of Provider: [facility] TTS Department  Patient Identification: Alyssa Mathis MRN:  [id number] Principal Diagnosis: Schizoaffective disorder, bipolar type (HCC) Diagnosis:  Principal Problem:   Schizoaffective disorder, bipolar type (HCC) Active Problems:   Postpartum depression   Total Time spent with patient: 30 minutes  Subjective:   Alyssa Mathis is a 35 y.o. female patient recommended for overnight observation for postpartum depression.  When asked how she is doing today she states,"you are not the right one."   Alyssa Mathis, 35 y.o., female patient presented to [facility]  Patient seen via telepsych by this provider; chart reviewed and consulted with Dr. Lucianne Muss on 01/14/21.  On evaluation Alyssa Mathis reports , she is seen standing by the nurses station and walks back to there room when asked to talk to with this Clinical research associate.  When seen on video, patient repeatedly states, "the screen is dark and I can't see.  She is not the right one.  The presence is dark and I cannot talk with you."  Nursing staff present and are able to verify the camera is working.    During assessment, patient seen turning the TV camera away from her and towards the wall and states, "I can't see me on the screen."  This writer continues to verbally coach the patient to participate in assessment at which time, she turns the camera back to her and then stairs at the camera.  She appears to be responding to internal stimulus, appears unable to fully participate in the assessment.  She appears disorganized, confused and slightly agitated, when she does respond it is tangential.       Per nursing notes since admission, patient has demonstrated confusion, disorganization and has required verbal redirection to return to her room or to stop yelling.  She received a call from her mother  but was very confused stating it was not her mother.    Patient's arrived via ambulance with her 81 month old infant, the nurse informed me that child is now with the father.    HPI Per EDP Admission Notes 01/13/2021:  History    Chief Complaint  Patient presents with  . Altered Mental Status    Alyssa Mathis is a 35 y.o. female.  HPI 35 year old female presents via EMS after [employer] helped her in a parking lot.  The patient is about 1 month postpartum and last night went to the MAU for concern for postpartum depression.  She was started on Zyprexa, which she first took last night around 1 AM.  She did not realize it would cause her to get this sleepy.  She was supposed to go get reevaluated for blood pressure but could remember where she was try to go and when she could not remember she asked [employer] for help and they were concerned and asked EMS to bring her to the hospital.  The patient does state that she has had some crying and depression but no suicidal or homicidal thoughts.  Denies hallucinations.  She denies any pain or illness otherwise. She feels like she's dehydrated because she has not eaten or drank today.   Past Psychiatric History: Schizoaffective Disorder; Postpartum Depression  Risk to Self:  yes Risk to Others:   Prior Inpatient Therapy:   Prior Outpatient Therapy:    Past Medical History:  Past Medical History:  Diagnosis Date  . Anemia   .  Hypertension   . Post partum depression   . Pregnancy induced hypertension   . Schizoaffective disorder [facility](HCC)     Past Surgical History:  Procedure Laterality Date  . NO PAST SURGERIES     Family History:  Family History  Problem Relation Age of Onset  . Hypertension Sister   . Hypertension Maternal Grandmother   . Diabetes Maternal Grandfather   . Hypertension Maternal Grandfather    Family Psychiatric  History: unknown Social History:  Social History   Substance and Sexual Activity  Alcohol Use Never     Social  History   Substance and Sexual Activity  Drug Use Not Currently    Social History   Socioeconomic History  . Marital status: Single    Spouse name: Not on file  . Number of children: Not on file  . Years of education: Not on file  . Highest education level: Not on file  Occupational History  . Not on file  Tobacco Use  . Smoking status: Former Games developermoker  . Smokeless tobacco: Never Used  Vaping Use  . Vaping Use: Not on file  Substance and Sexual Activity  . Alcohol use: Never  . Drug use: Not Currently  . Sexual activity: Yes  Other Topics Concern  . Not on file  Social History Narrative  . Not on file   Social Determinants of Health   Financial Resource Strain: Not on file  Food Insecurity: Not on file  Transportation Needs: Not on file  Physical Activity: Not on file  Stress: Not on file  Social Connections: Not on file   Additional Social History:    Allergies:   Allergies  Allergen Reactions  . Paliperidone Other (See Comments)    Dystonia  . Latex Rash    Labs:  Results for orders placed or performed during the hospital encounter of 01/13/21 (from the past 48 hour(s))  Comprehensive metabolic panel     Status: Abnormal   Collection Time: 01/13/21  2:39 PM  Result Value Ref Range   Sodium 139 135 - 145 mmol/L   Potassium 3.4 (L) 3.5 - 5.1 mmol/L   Chloride 105 98 - 111 mmol/L   CO2 25 22 - 32 mmol/L   Glucose, Bld 86 70 - 99 mg/dL    Comment: Glucose reference range applies only to samples taken after fasting for at least 8 hours.   BUN 11 6 - 20 mg/dL   Creatinine, Ser 1.610.98 0.44 - 1.00 mg/dL   Calcium 9.3 8.9 - 09.610.3 mg/dL   Total Protein 7.2 6.5 - 8.1 g/dL   Albumin 4.1 3.5 - 5.0 g/dL   AST 22 15 - 41 U/L   ALT 27 0 - 44 U/L   Alkaline Phosphatase 81 38 - 126 U/L   Total Bilirubin 0.9 0.3 - 1.2 mg/dL   GFR, Estimated >04>60 >54>60 mL/min    Comment: (NOTE) Calculated using the CKD-EPI Creatinine Equation (2021)    Anion gap 9 5 - 15    Comment:  Performed at [facility], [address] [place], [place] [address]  CBC with Differential     Status: None   Collection Time: 01/13/21  2:39 PM  Result Value Ref Range   WBC 5.4 4.0 - 10.5 K/uL   RBC 4.73 3.87 - 5.11 MIL/uL   Hemoglobin 14.1 12.0 - 15.0 g/dL   HCT 91.443.1 78.236.0 - 95.646.0 %   MCV 91.1 80.0 - 100.0 fL   MCH 29.8 26.0 -  34.0 pg   MCHC 32.7 30.0 - 36.0 g/dL   RDW 16.1 09.6 - 04.5 %   Platelets 224 150 - 400 K/uL   nRBC 0.0 0.0 - 0.2 %   Neutrophils Relative % 38 %   Neutro Abs 2.0 1.7 - 7.7 K/uL   Lymphocytes Relative 50 %   Lymphs Abs 2.7 0.7 - 4.0 K/uL   Monocytes Relative 10 %   Monocytes Absolute 0.5 0.1 - 1.0 K/uL   Eosinophils Relative 1 %   Eosinophils Absolute 0.1 0.0 - 0.5 K/uL   Basophils Relative 1 %   Basophils Absolute 0.1 0.0 - 0.1 K/uL   Immature Granulocytes 0 %   Abs Immature Granulocytes 0.02 0.00 - 0.07 K/uL    Comment: Performed at [facility], [address], [place], [place] [address]  Ethanol     Status: None   Collection Time: 01/13/21  3:30 PM  Result Value Ref Range   Alcohol, Ethyl (B) <10 <10 mg/dL    Comment: (NOTE) Lowest detectable limit for serum alcohol is 10 mg/dL.  For medical purposes only. Performed at [facility], [address] [place], [place] [address]   Acetaminophen level     Status: Abnormal   Collection Time: 01/13/21  3:30 PM  Result Value Ref Range   Acetaminophen (Tylenol), Serum <10 (L) 10 - 30 ug/mL    Comment: (NOTE) Therapeutic concentrations vary significantly. A range of 10-30 ug/mL  may be an effective concentration for many patients. However, some  are best treated at concentrations outside of this range. Acetaminophen concentrations >150 ug/mL at 4 hours after ingestion  and >50 ug/mL at 12 hours after ingestion are often associated with  toxic reactions.  Performed at [facility], [address], [place], [place] [address]   Salicylate level     Status: Abnormal    Collection Time: 01/13/21  3:30 PM  Result Value Ref Range   Salicylate Lvl <7.0 (L) 7.0 - 30.0 mg/dL    Comment: Performed at [facility], [address], [place], [place] [address]  Resp Panel by RT-PCR (Flu A&B, Covid) Nasopharyngeal Swab     Status: None   Collection Time: 01/13/21  3:50 PM   Specimen: Nasopharyngeal Swab; Nasopharyngeal(NP) swabs in vial transport medium  Result Value Ref Range   SARS Coronavirus 2 by RT PCR NEGATIVE NEGATIVE    Comment: (NOTE) SARS-CoV-2 target nucleic acids are NOT DETECTED.  The SARS-CoV-2 RNA is generally detectable in upper respiratory specimens during the acute phase of infection. The lowest concentration of SARS-CoV-2 viral copies this assay can detect is 138 copies/mL. A negative result does not preclude SARS-Cov-2 infection and should not be used as the sole basis for treatment or other patient management decisions. A negative result may occur with  improper specimen collection/handling, submission of specimen other than nasopharyngeal swab, presence of viral mutation(s) within the areas targeted by this assay, and inadequate number of viral copies(<138 copies/mL). A negative result must be combined with clinical observations, patient history, and epidemiological information. The expected result is Negative.  Fact Sheet for Patients:  BloggerCourse.com  Fact Sheet for Healthcare Providers:  SeriousBroker.it  This test is no t yet approved or cleared by the [place] FDA and  has been authorized for detection and/or diagnosis of SARS-CoV-2 by FDA under an Emergency Use Authorization (EUA). This EUA will remain  in effect (meaning this test can be used) for the duration of  the COVID-19 declaration under Section 564(b)(1) of the Act, 21 U.S.C.section 360bbb-3(b)(1), unless the authorization is terminated  or revoked sooner.       Influenza A by PCR NEGATIVE NEGATIVE    Influenza B by PCR NEGATIVE NEGATIVE    Comment: (NOTE) The Xpert Xpress SARS-CoV-2/FLU/RSV plus assay is intended as an aid in the diagnosis of influenza from Nasopharyngeal swab specimens and should not be used as a sole basis for treatment. Nasal washings and aspirates are unacceptable for Xpert Xpress SARS-CoV-2/FLU/RSV testing.  Fact Sheet for Patients: BloggerCourse.com  Fact Sheet for Healthcare Providers: SeriousBroker.it  This test is not yet approved or cleared by the [place] FDA and has been authorized for detection and/or diagnosis of SARS-CoV-2 by FDA under an Emergency Use Authorization (EUA). This EUA will remain in effect (meaning this test can be used) for the duration of the COVID-19 declaration under Section 564(b)(1) of the Act, 21 U.S.C. section 360bbb-3(b)(1), unless the authorization is terminated or revoked.  Performed at [facility], [address], [place], [place] [address]   CBG monitoring, ED     Status: None   Collection Time: 01/13/21  3:53 PM  Result Value Ref Range   Glucose-Capillary 89 70 - 99 mg/dL    Comment: Glucose reference range applies only to samples taken after fasting for at least 8 hours.  Urine rapid drug screen (hosp performed)     Status: None   Collection Time: 01/14/21  3:20 AM  Result Value Ref Range   Opiates NONE DETECTED NONE DETECTED   Cocaine NONE DETECTED NONE DETECTED   Benzodiazepines NONE DETECTED NONE DETECTED   Amphetamines NONE DETECTED NONE DETECTED   Tetrahydrocannabinol NONE DETECTED NONE DETECTED   Barbiturates NONE DETECTED NONE DETECTED    Comment: (NOTE) DRUG SCREEN FOR MEDICAL PURPOSES ONLY.  IF CONFIRMATION IS NEEDED FOR ANY PURPOSE, NOTIFY LAB WITHIN 5 DAYS.  LOWEST DETECTABLE LIMITS FOR URINE DRUG SCREEN Drug Class                     Cutoff (ng/mL) Amphetamine and metabolites    1000 Barbiturate and metabolites     200 Benzodiazepine                 200 Tricyclics and metabolites     300 Opiates and metabolites        300 Cocaine and metabolites        300 THC                            50 Performed at [facility], [address] [place], [place] [address]   Urinalysis, Routine w reflex microscopic Urine, Clean Catch     Status: Abnormal   Collection Time: 01/14/21  3:20 AM  Result Value Ref Range   Color, Urine AMBER (A) YELLOW    Comment: BIOCHEMICALS MAY BE AFFECTED BY COLOR   APPearance HAZY (A) CLEAR   Specific Gravity, Urine 1.029 1.005 - 1.030   pH 6.0 5.0 - 8.0   Glucose, UA NEGATIVE NEGATIVE mg/dL   Hgb urine dipstick NEGATIVE NEGATIVE   Bilirubin Urine NEGATIVE NEGATIVE   Ketones, ur 5 (A) NEGATIVE mg/dL   Protein, ur 30 (A) NEGATIVE mg/dL   Nitrite NEGATIVE NEGATIVE   Leukocytes,Ua NEGATIVE NEGATIVE   RBC / HPF 0-5 0 - 5 RBC/hpf   WBC, UA 6-10 0 - 5 WBC/hpf   Bacteria,  UA NONE SEEN NONE SEEN   Squamous Epithelial / LPF 0-5 0 - 5   Mucus PRESENT    Hyaline Casts, UA PRESENT     Comment: Performed at [facility], [address] [place], [place] [address]    Medications:  Current Facility-Administered Medications  Medication Dose Route Frequency Provider Last Rate Last Admin  . acetaminophen (TYLENOL) tablet 650 mg  650 mg Oral Q4H PRN Pricilla Loveless, MD      . NIFEdipine (PROCARDIA XL/NIFEDICAL XL) 24 hr tablet 60 mg  60 mg Oral Daily Pricilla Loveless, MD      . OLANZapine (ZYPREXA) tablet 5 mg  5 mg Oral QHS Pricilla Loveless, MD       Current Outpatient Medications  Medication Sig Dispense Refill  . acetaminophen (TYLENOL) 325 MG tablet Take 2 tablets (650 mg total) by mouth every 4 (four) hours as needed (for pain scale < 4).    [name] NIFEdipine (PROCARDIA XL/NIFEDICAL XL) 60 MG 24 hr tablet Take 1 tablet (60 mg total) by mouth daily. 30 tablet 5  . OLANZapine (ZYPREXA) 5 MG tablet Take 1 tablet (5 mg total) by mouth at bedtime. 30 tablet 5  . Prenat w/o  A-FE-Methfol-FA-DHA (PNV-DHA) 27-0.6-0.4-300 MG CAPS Take 1 capsule by mouth daily. (Patient not taking: No sig reported)      Musculoskeletal: Strength & Muscle Tone: within normal limits Gait & Station: normal Patient leans: N/A  Psychiatric Specialty Exam: Physical Exam  Review of Systems  Blood pressure (!) 125/91, pulse 84, temperature 98.3 F (36.8 C), temperature source Oral, resp. rate 16, height 5\' 5"  (1.651 m), weight 52.2 kg, SpO2 100 %, unknown if currently breastfeeding.Body mass index is 19.14 kg/m.  General Appearance: Bizarre  Eye Contact:  Fair  Speech:  Slow  Volume:  Normal  Mood:  Anxious, Depressed and Irritable  Affect:  Congruent and Restricted  Thought Process:  Disorganized  Orientation:  NA  Thought Content:  Illogical, Delusions and Rumination  Suicidal Thoughts:  does not answer questions  Homicidal Thoughts:  pt not responding to questions  Memory:  NA  Judgement:  Impaired  Insight:  Lacking  Psychomotor Activity:  Increased  Concentration:  Concentration: Poor and Attention Span: Poor  Recall:  unable to assess  Fund of Knowledge:  Poor  Language:  Good  Akathisia:  NA  Handed:  Right  AIMS (if indicated):     Assets:  Housing Social Support  ADL's:  Intact  Cognition:  Impaired,  Mild  Sleep:   <6 hours   Treatment Plan Summary: Daily contact with patient to assess and evaluate symptoms and progress in treatment and Medication management.  Ms. Culmer has a hx for schizoaffective disorder, bipolar type, presents with sx of postpartum depression and psychosis. She demonstrates impaired judgement, no insight fluctuating irritability which makes her a moderate-high safety concern,  warranting inpatient psychiatric care.  AdditionallShe arrived voluntarily but meets criteria for involuntary admission should she try to leave without being stabilized.  Prior to admission was started on zyprexa for mood management, no immediate improvement seen.  On  arrival her UDS is negative; covid 19 negative; Qt/QTC is 478/474. LFTs are WNL. Needs update urine pregnancy test.    Meds:  Continue zxprexa 5mg  po daily for psychosis;  may give IM if patient refuses oral dose PRN meds ordered for agitation.    Disposition: Recommend psychiatric Inpatient admission when medically cleared.  This service was provided via telemedicine using a 2-way,  interactive audio and Immunologist.  Names of all persons participating in this telemedicine service and their role in this encounter. Name:Ronnisha Kizzie Bane Role: Patient  Name: Ophelia Shoulder Role: PMHNP    Chales Abrahams, NP 01/14/2021 2:56 PM

## 2021-01-14 NOTE — ED Notes (Signed)
pts newborn picked up by her father,  my next of kin [name].

## 2021-01-14 NOTE — ED Notes (Signed)
Pt walked up to desk & did not speak, looked around & would not respond to this RN when asking if she needed anything. Pt stared at RN & stood in place for approx. 5 minutes before turning back into her room.

## 2021-01-14 NOTE — ED Notes (Signed)
Pt mumbling under her breath, speaking to herself in a low whisper while slowly stepping down the hall outside her room. She would look at staff but not speak, tearful, slow to move & react. NT speaking with her & starting to get a verbal response, pt saying "sorry." She is walking back to her room at this time.

## 2021-01-14 NOTE — ED Notes (Signed)
Pt yelling at staff calling them satan saying "I rebuke you."

## 2021-01-15 MED ORDER — LORAZEPAM 2 MG/ML IJ SOLN
1.0000 mg | Freq: Once | INTRAMUSCULAR | Status: DC
  Filled 2021-01-15: qty 1

## 2021-01-15 MED ORDER — STERILE WATER FOR INJECTION IJ SOLN
INTRAMUSCULAR | Status: AC
  Administered 2021-01-15: 1 mL via INTRAMUSCULAR
  Filled 2021-01-15: qty 10

## 2021-01-15 MED ORDER — STERILE WATER FOR INJECTION IJ SOLN
INTRAMUSCULAR | Status: AC
  Filled 2021-01-15: qty 10

## 2021-01-15 MED ORDER — ZIPRASIDONE MESYLATE 20 MG IM SOLR
INTRAMUSCULAR | Status: AC
  Administered 2021-01-15: 20 mg via INTRAMUSCULAR
  Filled 2021-01-15: qty 20

## 2021-01-15 MED ORDER — ZIPRASIDONE MESYLATE 20 MG IM SOLR: 10.0000 mg | Freq: Once | INTRAMUSCULAR | Status: DC

## 2021-01-15 MED ORDER — ZIPRASIDONE MESYLATE 20 MG IM SOLR: 20.0000 mg | Freq: Once | INTRAMUSCULAR | Status: AC

## 2021-01-15 NOTE — BH Assessment (Signed)
TTS attempted to reassess patient today.  She would look at the screen, but would not answer questions.  She started laughing inappropriately, but still would not say anything. I was able to get her to shake her head as to whether she was suicidal or homicidal and she indicated that she was not.  Other than that, patient was non-responsive to all other questions.  Inpatient treatment continues to be recommended.

## 2021-01-15 NOTE — ED Notes (Signed)
Pt showered. Scrubs and linens changed.

## 2021-01-15 NOTE — ED Notes (Signed)
Awoke for AM vitals. Calm and cooperative. Remains laying in [place], NAD. Eyes closed. RR e/u on RA.

## 2021-01-15 NOTE — ED Notes (Signed)
Pt escalating, pt yelling, singing, and walking fast through department. Pt redirected into room, pt jumping into staff's face yelling, "satan".  Pt coming at staff with hands out.  Duress button pressed. Security to room, Dr. Rubin Payor to room. Geodon ordered and given. Pt laughing and yelling.  Sitter and staff remain at door. Pt throwing water around room.

## 2021-01-15 NOTE — ED Notes (Signed)
ED RN medicated pt w/ PRNs

## 2021-01-15 NOTE — ED Notes (Signed)
Pt out of room numerous times, redirected back to room. Pt looking around, moving slowly, states she does not understand. Pt asking about food and drinks, saying something is not right. Pt assured that food and drink are correct.

## 2021-01-15 NOTE — ED Notes (Addendum)
Pt became very manic and refused to stay in room. Came out to nurses desk and slammed her hand on the desk with such force it turned off computer as this tech was charting. Stating "Jesus is alive! All of you Prudy Feeler worshipers will go to hell. Flee Prudy Feeler, flee! Appears to be trying to talk in "tongues"". Security called to bedside. MD notified and IVC papers in progress at this time.

## 2021-01-15 NOTE — ED Notes (Signed)
Breakfast Ordered 

## 2021-01-15 NOTE — ED Notes (Signed)
Pt in room laughing and yelling "jesus is alive", multiple RNs and tech at bedside, asking pt to take some deep breaths and stop yelling due to other patients being around, pt stated "well they need to wake up too because jesus is alive". Pt not being aggressive or threatening, just repeatedly yelling, laughing, and screaming.

## 2021-01-15 NOTE — ED Notes (Signed)
Security at bedside, pt yelling and screaming in hallway. Pt yelling at security to move. Pt refusing to go back into room

## 2021-01-15 NOTE — ED Notes (Signed)
Pt laughing and screaming, however staying in her room at this time.

## 2021-01-15 NOTE — ED Notes (Signed)
Breakfast tray delivered

## 2021-01-15 NOTE — Progress Notes (Addendum)
Per Ophelia Shoulder, NP, patient meets criteria for inpatient treatment. There are no available or appropriate beds at [facility] today. CSW re-faxed referrals to the following facilities for review:  [facility]  TTS will continue to seek bed placement.  Crissie Reese, MSW, LCSW-A, LCAS-A Phone: [phone] Disposition/TOC

## 2021-01-15 NOTE — ED Notes (Addendum)
Pt escorted back to room after roaming the halls and screaming. Pt eating remainder of her breakfast tray. Pt was able to be redirected to eat food after her episode of excitement, laughing and screaming.

## 2021-01-15 NOTE — ED Notes (Signed)
Pt tearful and roaming the hallways. Pt unable to follow instructions to return to room. Pt unable to get her thoughts together to communicate what she needs.

## 2021-01-15 NOTE — ED Provider Notes (Signed)
Emergency Medicine Observation Re-evaluation Note  Alyssa Mathis is a 35 y.o. female, seen on rounds today.  Pt initially presented to the ED for complaints of diarrhea, depressed mood.  Postpartum.    Physical Exam  BP (!) 137/99 (BP Location: Left Arm)   Pulse 95   Temp 98.4 F (36.9 C) (Oral)   Resp 14   Ht 5\' 5"  (1.651 m)   Wt 52.2 kg   SpO2 100%   BMI 19.14 kg/m  Physical Exam General: Steady gait Cardiac: Normal rate Lungs: Normal left Psych: Patient speech is delayed and disorganized, difficulty expressing her thoughts though appears anxious and tearful.  States she "did wrong by her children" also states she she has no family.  Patient is pacing at the nursing station with these worries  ED Course / MDM  EKG:EKG Interpretation  Date/Time:  Friday January 13 2021 15:50:29 EDT Ventricular Rate:  59 PR Interval:  108 QRS Duration: 90 QT Interval:  478 QTC Calculation: 474 R Axis:   78 Text Interpretation: Sinus rhythm Short PR interval LVH by voltage Abnormal T, consider ischemia, anterior leads No old tracing to compare Confirmed by [id number] [phone]) on 01/13/2021 4:04:16 PM   I have reviewed the labs performed to date as well as medications administered while in observation.  Recent changes in the last 24 hours include psychiatry commendations for daily Zyprexa, as needed Geodon for agitation.  Patient meets and creation criteria awaiting bed placement.  Plan  Current plan is for awaiting inpatient placement     Daton Szilagyi, 01/15/2021 N, PA-C 01/15/21 1015    03/17/21, MD 01/15/21 1154    03/17/21, MD 01/15/21 1354

## 2021-01-16 MED ORDER — LORAZEPAM 2 MG/ML IJ SOLN
1.0000 mg | Freq: Once | INTRAMUSCULAR | Status: AC | PRN
  Administered 2021-01-16: 1 mg via INTRAVENOUS

## 2021-01-16 MED ORDER — DIPHENHYDRAMINE HCL 50 MG/ML IJ SOLN: 25.0000 mg | Freq: Once | INTRAMUSCULAR | Status: DC | PRN

## 2021-01-16 MED ORDER — STERILE WATER FOR INJECTION IJ SOLN
INTRAMUSCULAR | Status: AC
  Filled 2021-01-16: qty 10

## 2021-01-16 MED ORDER — HALOPERIDOL LACTATE 5 MG/ML IJ SOLN
5.0000 mg | Freq: Once | INTRAMUSCULAR | Status: AC | PRN
  Administered 2021-01-16: 5 mg via INTRAMUSCULAR
  Filled 2021-01-16: qty 1

## 2021-01-16 NOTE — ED Notes (Signed)
Sheriff's office called to set up transport to [facility].

## 2021-01-16 NOTE — ED Provider Notes (Signed)
Emergency Medicine Observation Re-evaluation Note  Alyssa Mathis is a 35 y.o. female, seen on rounds today.  Pt initially presented to the ED for complaints of Altered Mental Status Currently, the patient is agitated and singing loudly. Is redirectable with persistence  Physical Exam  BP (!) 154/107 (BP Location: Right Arm)   Pulse (!) 118   Temp 98.5 F (36.9 C) (Oral)   Resp 20   Ht 5\' 5"  (1.651 m)   Wt 52.2 kg   SpO2 100%   BMI 19.14 kg/m  Physical Exam General:singing loudly and intermittently shouting Cardiac: tachycardia Lungs: clear Psych: agitated and delusional  ED Course / MDM  EKG:EKG Interpretation  Date/Time:  Friday January 13 2021 15:50:29 EDT Ventricular Rate:  59 PR Interval:  108 QRS Duration: 90 QT Interval:  478 QTC Calculation: 474 R Axis:   78 Text Interpretation: Sinus rhythm Short PR interval LVH by voltage Abnormal T, consider ischemia, anterior leads No old tracing to compare Confirmed by [id number] [phone]) on 01/13/2021 4:04:16 PM   I have reviewed the labs performed to date as well as medications administered while in observation.  Recent changes in the last 24 hours include waiting for inpt bed .  Plan  Current plan is for in patient. Patient is under full IVC at this time.   01/15/2021, MD 01/16/21 1153

## 2021-01-16 NOTE — ED Notes (Signed)
Pt asleep. Chest with equal rise and fall.

## 2021-01-16 NOTE — Progress Notes (Signed)
CSW asked to identify where the patient's newborn was at morning meeting by Dr. Lucianne Muss.  CSW reviewed the notes in pt's chart and was able to identify on a note from 4/30/82022 at 2:01AM, pts newborn was picked up by the father.   Penni Homans, MSW, LCSW 01/16/2021 9:43 AM

## 2021-01-16 NOTE — ED Notes (Signed)
Pt cannot arrive to accepting facility until after 2000 and must be transported via [facility].  Likely hood is pt will not go until the morning do to unavailability of transport. Prepared to hold overnight.

## 2021-01-16 NOTE — ED Notes (Signed)
Pt willing to take injections after the sitter (pt insists is the devil) left the room. Security standing by.

## 2021-01-16 NOTE — ED Notes (Signed)
Pt requesting cold water and wash cloth after being told she can not shower until morning. Also given tooth brush and toothpaste.

## 2021-01-16 NOTE — ED Notes (Signed)
Pt in hall yelling, unable to redirect. Order given for Haldol, Benadryl and Ativan.

## 2021-01-16 NOTE — ED Notes (Signed)
Pt is awake and asking to make a phone call.  Retrieved phone from belongings to find a phone number.  Phone is charging.  Pt is cooperative at this time.

## 2021-01-16 NOTE — ED Notes (Signed)
Pt confronting sitter in aggressive manner, pacing room singing and yelling. PRN meds given.

## 2021-01-16 NOTE — ED Notes (Signed)
IVC Breakfast order placed

## 2021-01-16 NOTE — ED Notes (Addendum)
Pt refuses to wear scrubs. Psycg at bedside. States patient may wear a gown. Pt also refuses vital signs.

## 2021-01-16 NOTE — Telephone Encounter (Signed)
Attempted to call patient but phone rang numerous times before a message stated "your call could not be completed at this time". Per chart review, patient went to ER 4/29 & is currently admitted with [facility]. BP was normal upon admission.

## 2021-01-16 NOTE — Progress Notes (Signed)
Patient was accepted to [facility], NP.    Meets inpatient criteria per Assunta Found, NP.   Attending physician is Dr. Lorelee Cover.    Notified Melissa Angola, RN of acceptance.  Nurses call report to [phone].    Patient can arrive after 8:00PM on 01/16/2021.    Penni Homans, MSW, LCSW 01/16/2021 2:38 PM

## 2021-01-16 NOTE — BH Assessment (Signed)
This Clinical research associate spoke with patient this date to assess current mental health status. Patient continues to have AMS and is actively psychotic. Patient is observed to be hyper-religious and is difficult to redirect. Patient is agitated and is in the hall outside her room raising her voice and quoting religious scriptures. Patient cannot be redirected back to her room as security was called to intervene. Rankin NP is in the process of ordering agitation medication. [facility] NP recommends a inpatient admission to assist with stabilization.

## 2021-01-16 NOTE — ED Notes (Signed)
After PT was medicated , PT continued  walking around the room singing very loud. PT stated that no one could stop her from singing to God.

## 2021-02-02 ENCOUNTER — Ambulatory Visit: Payer: Medicaid Other | Admitting: Obstetrics and Gynecology

## 2021-04-25 IMAGING — CT CT NECK W/ CM
4 series · 14 of 33 positions shown, 17 images · IV contrast (omnipaque)
Comparison: None.

CLINICAL DATA: Facial cellulitis. Left-sided swelling. Left lower
dental pain.

EXAM:
CT NECK WITH CONTRAST
TECHNIQUE: Multidetector CT imaging of the neck was performed using the
standard protocol following the bolus administration of intravenous
contrast.
CONTRAST:  75mL OMNIPAQUE IOHEXOL 300 MG/ML  SOLN

[Series 2: axial neck · axial · 0.48mm/px · z∈[+62,+212]mm · 5 of 113 slices shown, 7 images]
[im 19/113  soft-tissue]
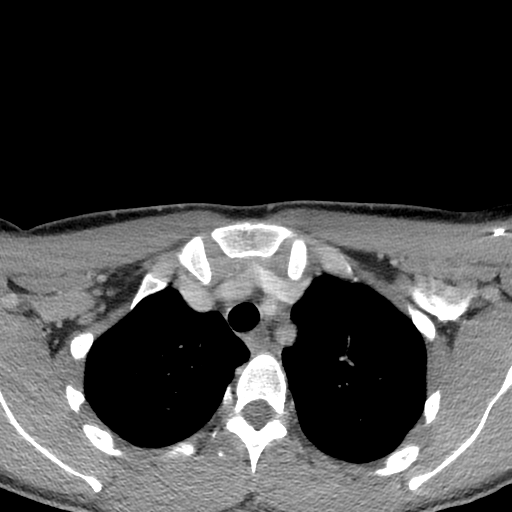
[im 19/113  bone]
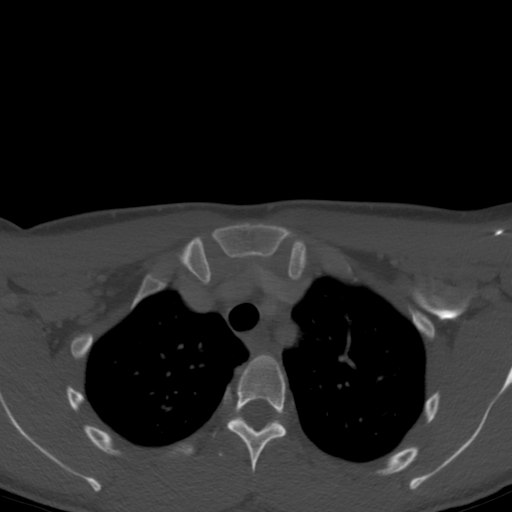
[im 38/113  bone]
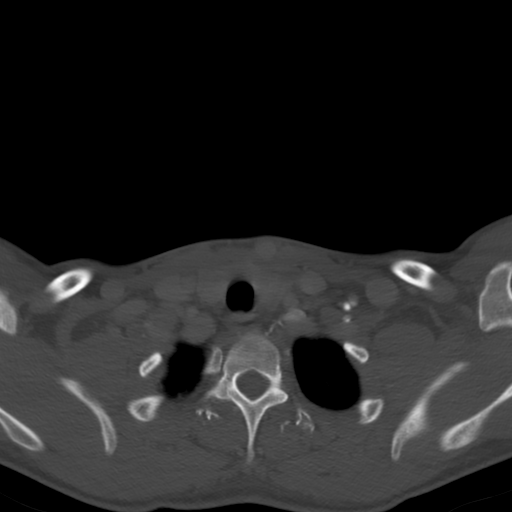
[im 57/113  bone]
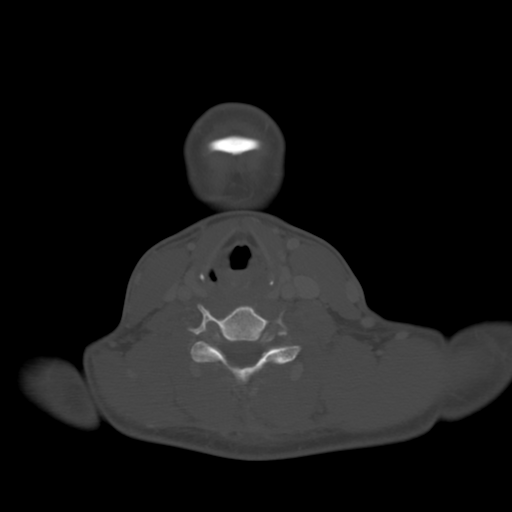
[im 75/113  bone]
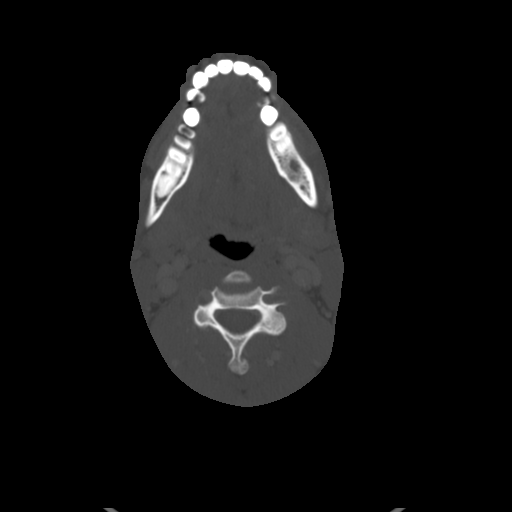
[im 94/113  soft-tissue]
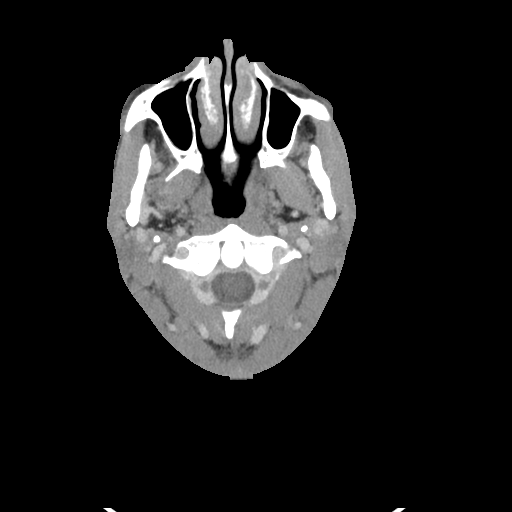
[im 94/113  bone]
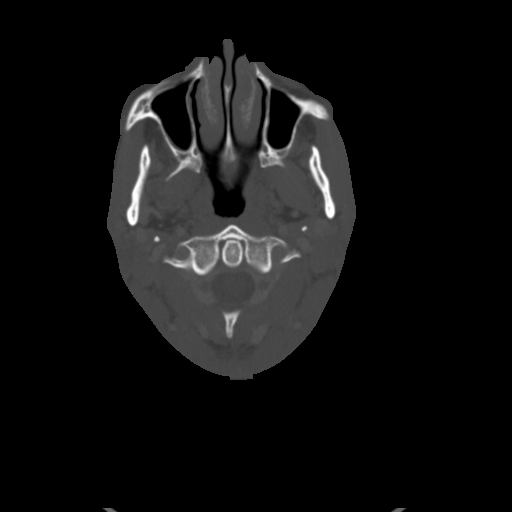

[Series 5: sag neck · sagittal · 0.41mm/px · 5 of 68 slices shown, 6 images]
[im 23/68  bone]
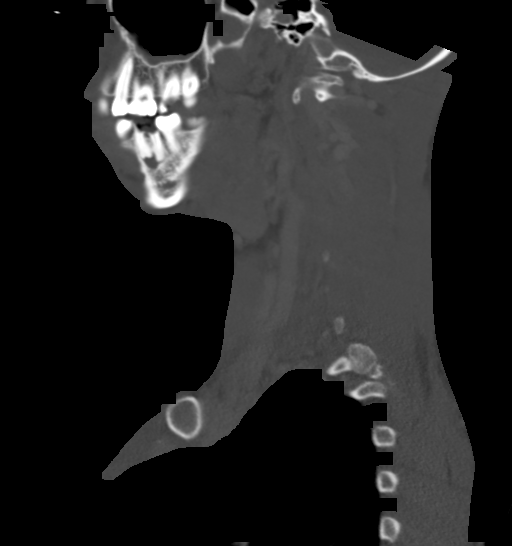
[im 28/68  bone]
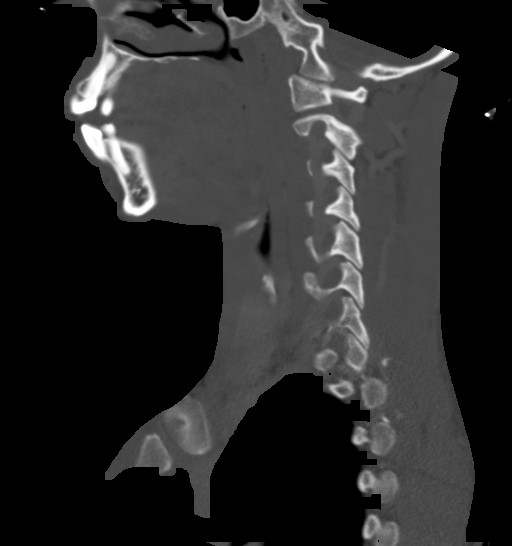
[im 34/68  soft-tissue]
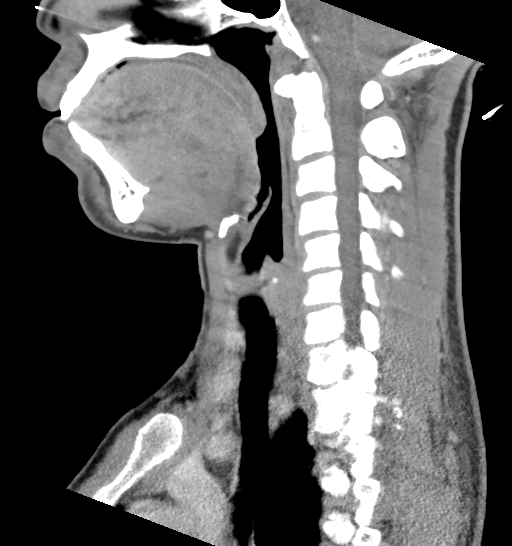
[im 34/68  bone]
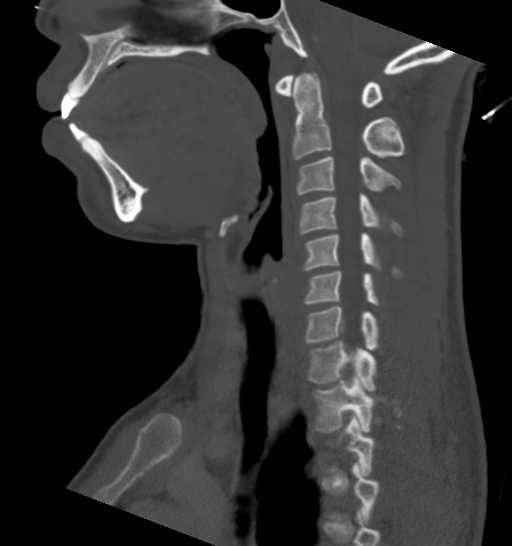
[im 40/68  bone]
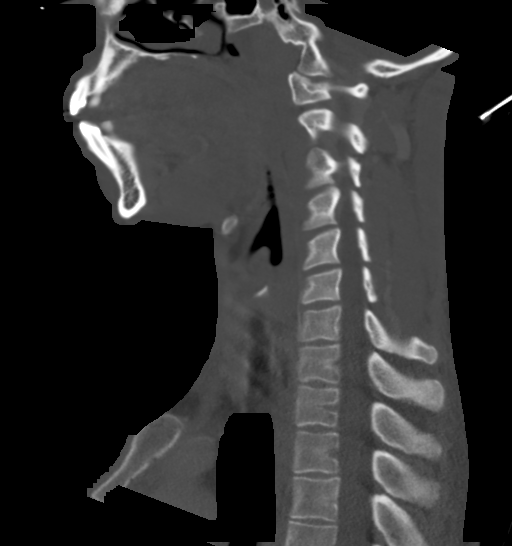
[im 45/68  bone]
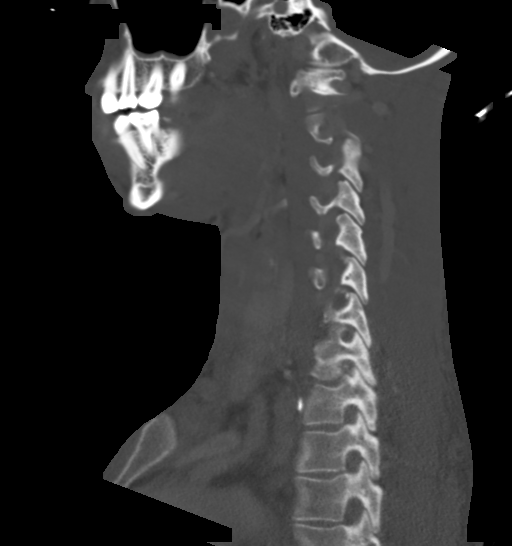

[Series 6: cor neck · coronal · 0.31mm/px · 3 of 91 slices shown]
[im 23/91  bone]
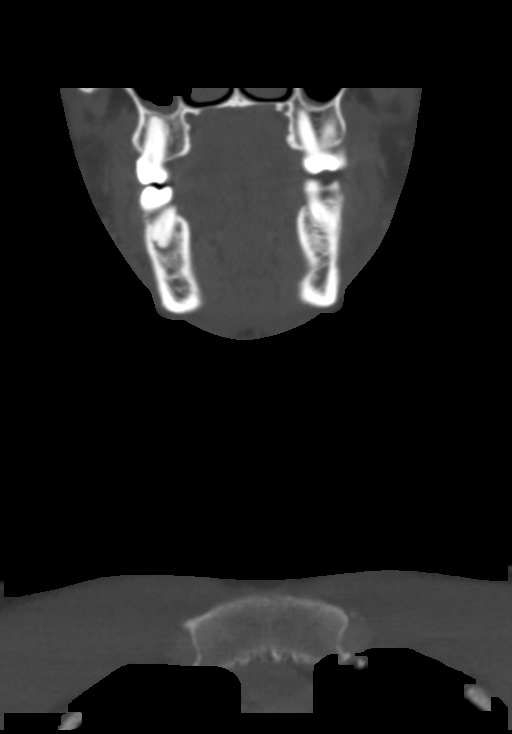
[im 38/91  bone]
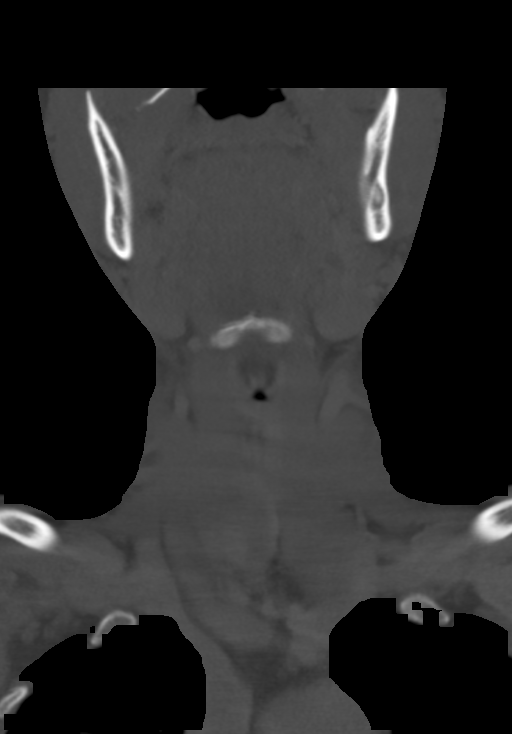
[im 53/91  bone]
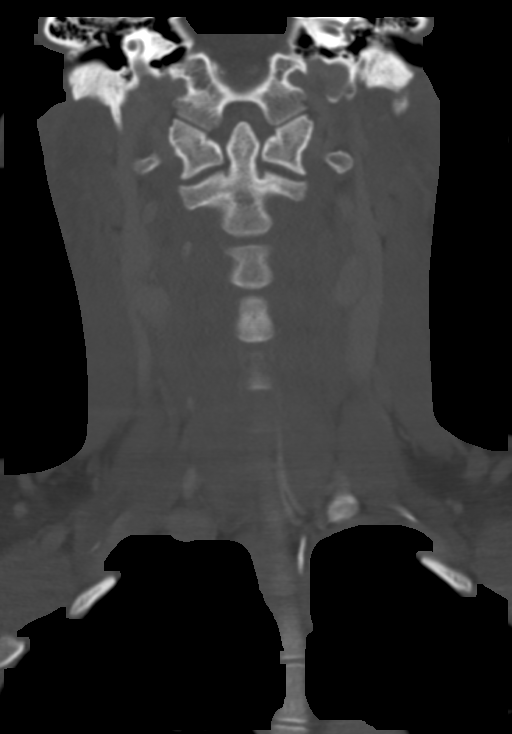

[Series 7: orthogonal ax · axial · 0.30mm/px · 1 of 112 slices shown]
[im 19/112  bone]
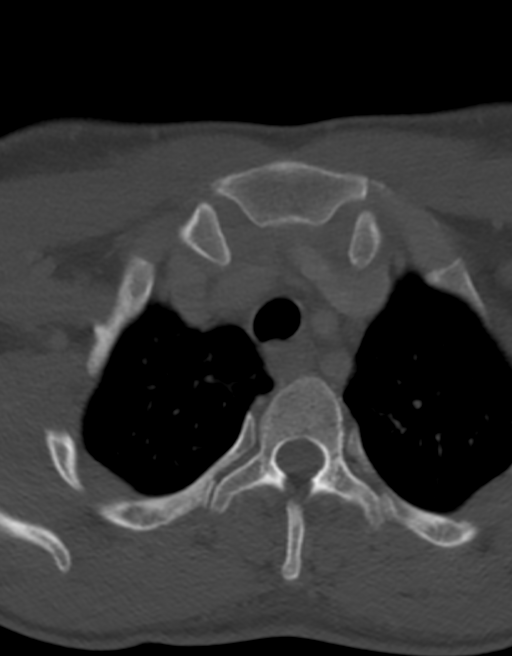

[14 of 33 positions shown; findings below may reference images not displayed]

FINDINGS: Pharynx and larynx: Normal. No mass or swelling.

Salivary glands: No inflammation, mass, or stone.

Thyroid: Negative

Lymph nodes: No enlarged lymph nodes in the neck. 1 cm level 2 lymph
nodes bilaterally within normal limits for size.

Vascular: Normal vascular enhancement.

Limited intracranial: Negative

Visualized orbits: Negative

Mastoids and visualized paranasal sinuses: Mild mucosal edema
maxillary sinus bilaterally. Remaining sinuses clear.

Skeleton: Multiple dental caries. Periapical lucency and caries
around left lower second molar.

Upper chest: Lung apices clear bilaterally.

Other: Mild soft tissue swelling lateral to the mandible on the
left. No abscess identified.
IMPRESSION: Poor dentition with multiple caries. Caries and periapical lucency
around left lower second molar. There is soft tissue swelling
lateral to this. No soft tissue abscess.

## 2021-07-20 ENCOUNTER — Emergency Department
Admission: EM | Admit: 2021-07-20 | Discharge: 2021-07-20 | Disposition: A | Discharge status: Home / Self Care | Payer: Medicaid Other | Attending: Emergency Medicine | Admitting: Emergency Medicine

## 2021-07-20 ENCOUNTER — Encounter: Payer: Self-pay | Admitting: *Deleted

## 2021-07-20 ENCOUNTER — Other Ambulatory Visit: Payer: Self-pay

## 2021-07-20 DIAGNOSIS — F17210 Nicotine dependence, cigarettes, uncomplicated: Secondary | ICD-10-CM | POA: Insufficient documentation

## 2021-07-20 DIAGNOSIS — Z79899 Other long term (current) drug therapy: Secondary | ICD-10-CM | POA: Diagnosis not present

## 2021-07-20 DIAGNOSIS — Z3490 Encounter for supervision of normal pregnancy, unspecified, unspecified trimester: Secondary | ICD-10-CM

## 2021-07-20 DIAGNOSIS — O210 Mild hyperemesis gravidarum: Secondary | ICD-10-CM | POA: Diagnosis present

## 2021-07-20 DIAGNOSIS — Z91040 Latex allergy status: Secondary | ICD-10-CM | POA: Insufficient documentation

## 2021-07-20 DIAGNOSIS — I10 Essential (primary) hypertension: Secondary | ICD-10-CM | POA: Diagnosis not present

## 2021-07-20 DIAGNOSIS — Z3A00 Weeks of gestation of pregnancy not specified: Secondary | ICD-10-CM | POA: Insufficient documentation

## 2021-07-20 LAB — COMPREHENSIVE METABOLIC PANEL
ALT: 13 U/L (ref 0–44)
AST: 15 U/L (ref 15–41)
Albumin: 4.2 g/dL (ref 3.5–5.0)
Alkaline Phosphatase: 79 U/L (ref 38–126)
Anion gap: 11 (ref 5–15)
BUN: 8 mg/dL (ref 6–20)
CO2: 21 mmol/L — ABNORMAL LOW (ref 22–32)
Calcium: 9.3 mg/dL (ref 8.9–10.3)
Chloride: 106 mmol/L (ref 98–111)
Creatinine, Ser: 0.64 mg/dL (ref 0.44–1.00)
GFR, Estimated: >60 mL/min (ref >60)
Glucose, Bld: 87 mg/dL (ref 70–99)
Potassium: 3.6 mmol/L (ref 3.5–5.1)
Sodium: 138 mmol/L (ref 135–145)
Total Bilirubin: 0.9 mg/dL (ref 0.3–1.2)
Total Protein: 7.8 g/dL (ref 6.5–8.1)

## 2021-07-20 LAB — CBC
HCT: 39.9 % (ref 36.0–46.0)
Hemoglobin: 13.9 g/dL (ref 12.0–15.0)
MCH: 31.1 pg (ref 26.0–34.0)
MCHC: 34.8 g/dL (ref 30.0–36.0)
MCV: 89.3 fL (ref 80.0–100.0)
Platelets: 249 10*3/uL (ref 150–400)
RBC: 4.47 MIL/uL (ref 3.87–5.11)
RDW: 11.7 % (ref 11.5–15.5)
WBC: 6.1 10*3/uL (ref 4.0–10.5)
nRBC: 0 % (ref 0.0–0.2)

## 2021-07-20 LAB — URINALYSIS, ROUTINE W REFLEX MICROSCOPIC
Bacteria, UA: NONE SEEN
Bilirubin Urine: NEGATIVE
Glucose, UA: NEGATIVE mg/dL
Ketones, ur: 80 mg/dL — AB
Nitrite: NEGATIVE
Protein, ur: 30 mg/dL — AB
Specific Gravity, Urine: 1.026 (ref 1.005–1.030)
Squamous Epithelial / HPF: >50 — ABNORMAL HIGH (ref 0–5)
pH: 6 (ref 5.0–8.0)

## 2021-07-20 LAB — HCG, QUANTITATIVE, PREGNANCY: hCG, Beta Chain, Quant, S: 56127 m[IU]/mL — ABNORMAL HIGH (ref <5)

## 2021-07-20 LAB — POC URINE PREG, ED: Preg Test, Ur: POSITIVE — AB

## 2021-07-20 LAB — LIPASE, BLOOD: Lipase: 26 U/L (ref 11–51)

## 2021-07-20 NOTE — ED Notes (Signed)
ERMD at bedside , Pt does npot want additional diagnostic procedure .  Wants only to find out + preg test. AAOx4. In NAD .

## 2021-07-20 NOTE — ED Triage Notes (Signed)
Pt reports vomiting x 1 today.   No diarrhea.  No abd pain.  Missed menses last month.  2 negative home pregnancy test last night.  Pt alert  speech clear.

## 2021-07-20 NOTE — ED Notes (Signed)
Poct pregnancy Positive 

## 2021-07-20 NOTE — ED Provider Notes (Signed)
[facility] Emergency Department Provider Note  ____________________________________________  Time seen: Approximately 11:39 PM  I have reviewed the triage vital signs and the nursing notes.   HISTORY  Chief Complaint Emesis   HPI Alyssa Mathis is a 35 y.o. female with a history of anemia, hypertension, schizoaffective disorder who presents for evaluation of concerns about possibly being pregnant.  Patient reports that she missed her last menstrual period and over the last couple of days has been having morning sickness with nausea and vomiting.  She felt like she was pregnant and took 2 home pregnancy test which were negative.  She presents today requesting a blood pregnancy test.  She denies abdominal pain, chest pain, shortness of breath, fever, cough, congestion, sore throat, diarrhea, vaginal discharge, vaginal bleeding.  Patient reports that this would be her fifth pregnancy.  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   History of preterm delivery 08/08/2020   History of pre-eclampsia 08/03/2020   Schizoaffective disorder, bipolar type (HCC) 06/28/2020   Subchorionic hematoma in first trimester 06/28/2020   Schizoaffective disorder (HCC) 06/27/2020   Chronic hypertension 06/27/2020   Supervision of high risk pregnancy in second trimester 06/16/2020   Poor compliance 03/03/2018   Oligohydramnios antepartum, third trimester 03/03/2018   Spontaneous vaginal delivery 03/03/2018   Chronic hypertension with superimposed severe preeclampsia 03/01/2018    Past Surgical History:   Procedure Laterality Date   NO PAST SURGERIES      Prior to Admission medications   Medication Sig Start Date End Date Taking? Authorizing Provider  acetaminophen (TYLENOL) 325 MG tablet Take 2 tablets (650 mg total) by mouth every 4 (four) hours as needed (for pain scale < 4). 12/18/20   Vaughn Bing, MD  NIFEdipine (PROCARDIA XL/NIFEDICAL XL) 60 MG 24 hr tablet Take 1 tablet (60 mg total) by mouth daily. 01/12/21   Venora Maples, MD  OLANZapine (ZYPREXA) 5 MG tablet Take 1 tablet (5 mg total) by mouth at bedtime. 01/12/21   Venora Maples, MD    Allergies Paliperidone and Latex  Family History  Problem Relation Age of Onset   Hypertension Sister    Hypertension Maternal Grandmother    Diabetes Maternal Grandfather    Hypertension Maternal Grandfather     Social History Social History   Tobacco Use   Smoking status: Every Day    Types: Cigarettes   Smokeless tobacco: Never  Substance Use Topics   Alcohol use: Never   Drug use: Not Currently    Review of Systems  Constitutional: Negative for fever. Eyes: Negative for visual changes. ENT: Negative for sore throat. Neck: No neck pain  Cardiovascular: Negative for chest pain. Respiratory: Negative for shortness of breath. Gastrointestinal: Negative for abdominal pain or diarrhea. + N/V Genitourinary: Negative for dysuria. Musculoskeletal: Negative for back pain. Skin: Negative for rash. Neurological: Negative for headaches, weakness or numbness. Psych: No SI or HI  ____________________________________________   PHYSICAL EXAM:  VITAL SIGNS: ED Triage Vitals  Enc Vitals Group     BP 07/20/21 1731 (!) 181/127     Pulse Rate 07/20/21 1731 85  Resp 07/20/21 1731 20     Temp 07/20/21 1731 98.7 F (37.1 C)     Temp Source 07/20/21 1731 Oral     SpO2 07/20/21 1731 99 %     Weight 07/20/21 1727 175 lb (79.4 kg)     Height 07/20/21 1727 5\' 5"  (1.651 m)     Head Circumference --      Peak Flow --       Pain Score --      Pain Loc --      Pain Edu? --      Excl. in GC? --     Constitutional: Alert and oriented. Well appearing and in no apparent distress. HEENT:      Head: Normocephalic and atraumatic.         Eyes: Conjunctivae are normal. Sclera is non-icteric.       Mouth/Throat: Mucous membranes are moist.       Neck: Supple with no signs of meningismus. Cardiovascular: Regular rate and rhythm. No murmurs, gallops, or rubs. 2+ symmetrical distal pulses are present in all extremities. No JVD. Respiratory: Normal respiratory effort. Lungs are clear to auscultation bilaterally.  Gastrointestinal: Soft, non tender, and non distended with positive bowel sounds. No rebound or guarding. Genitourinary: No CVA tenderness. Musculoskeletal:  No edema, cyanosis, or erythema of extremities. Neurologic: Normal speech and language. Face is symmetric. Moving all extremities. No gross focal neurologic deficits are appreciated. Skin: Skin is warm, dry and intact. No rash noted. Psychiatric: Mood and affect are normal. Speech and behavior are normal.  ____________________________________________   LABS (all labs ordered are listed, but only abnormal results are displayed)  Labs Reviewed  COMPREHENSIVE METABOLIC PANEL - Abnormal; Notable for the following components:      Result Value   CO2 21 (*)    All other components within normal limits  URINALYSIS, ROUTINE W REFLEX MICROSCOPIC - Abnormal; Notable for the following components:   Color, Urine YELLOW (*)    APPearance CLOUDY (*)    Hgb urine dipstick SMALL (*)    Ketones, ur 80 (*)    Protein, ur 30 (*)    Leukocytes,Ua SMALL (*)    Squamous Epithelial / LPF >50 (*)    All other components within normal limits  HCG, QUANTITATIVE, PREGNANCY - Abnormal; Notable for the following components:   hCG, Beta Chain, Quant, S 56,127 (*)    All other components within normal limits  POC URINE PREG, ED - Abnormal; Notable for the following  components:   Preg Test, Ur POSITIVE (*)    All other components within normal limits  LIPASE, BLOOD  CBC   ____________________________________________  EKG  none  ____________________________________________  RADIOLOGY  none  ____________________________________________   PROCEDURES  Procedure(s) performed: None Procedures   Critical Care performed:  None ____________________________________________   INITIAL IMPRESSION / ASSESSMENT AND PLAN / ED COURSE  35 y.o. female with a history of anemia, hypertension, schizoaffective disorder who presents for evaluation of concerns about possibly being pregnant and requesting a blood pregnancy test since she missed her period last month and has had morning sickness over the last few days.  She denies any abdominal pain, vaginal bleeding or vaginal discharge.  Her pregnancy test here is positive with an hCG of 56,000.  Blood work is otherwise with no acute findings.  Recommended pelvic exam and transvaginal ultrasound but patient declined.  Also offered IV fluids and IV Zofran since patient does have some ketones in her urine.  Patient  reports that she has been here for 6.5 hours and just really needs to go home.  She reports that she will follow-up with her OB/GYN for an ultrasound and a pelvic exam.  Since patient has no abdominal pain or tenderness I think this is reasonable approach.  Recommended taking prenatal vitamins at home and returning to the hospital if she develops any abdominal pain or vaginal bleeding.  Old medical records review including notes from patient's most recent obstetric visit from April 2022 after having her last baby.      _____________________________________________ Please note:  Patient was evaluated in Emergency Department today for the symptoms described in the history of present illness. Patient was evaluated in the context of the global COVID-19 pandemic, which necessitated consideration that the patient  might be at risk for infection with the SARS-CoV-2 virus that causes COVID-19. Institutional protocols and algorithms that pertain to the evaluation of patients at risk for COVID-19 are in a state of rapid change based on information released by regulatory bodies including the CDC and federal and state organizations. These policies and algorithms were followed during the patient's care in the ED.  Some ED evaluations and interventions may be delayed as a result of limited staffing during the pandemic.   [place] Controlled Substance Database was reviewed by me. ____________________________________________   FINAL CLINICAL IMPRESSION(S) / ED DIAGNOSES   Final diagnoses:  Morning sickness  Pregnancy, unspecified gestational age      NEW MEDICATIONS STARTED DURING THIS VISIT:  ED Discharge Orders     None        Note:  This document was prepared using Dragon voice recognition software and may include unintentional dictation errors.    Nita Sickle, MD 07/20/21 [phone]

## 2021-08-07 IMAGING — US US OB COMP LESS 14 WK
1 series · 14 of 28 positions shown · non-contrast
Comparison: May 11, 2020

CLINICAL DATA: Pain following pelvic trauma

EXAM:
OBSTETRIC <14 WK ULTRASOUND
TECHNIQUE: Transabdominal ultrasound was performed for evaluation of the
gestation as well as the maternal uterus and adnexal regions.

[Series 1: us ob comp less 14 wks · 14 of 52 slices shown]
[im 2/52]
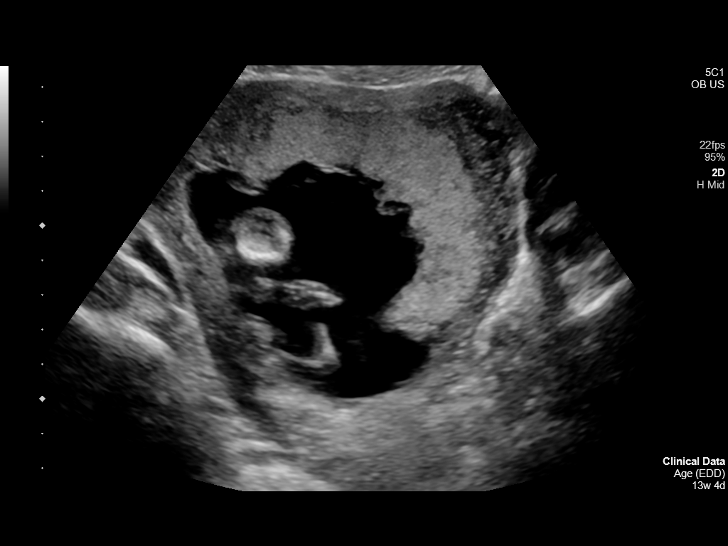
[im 6/52]
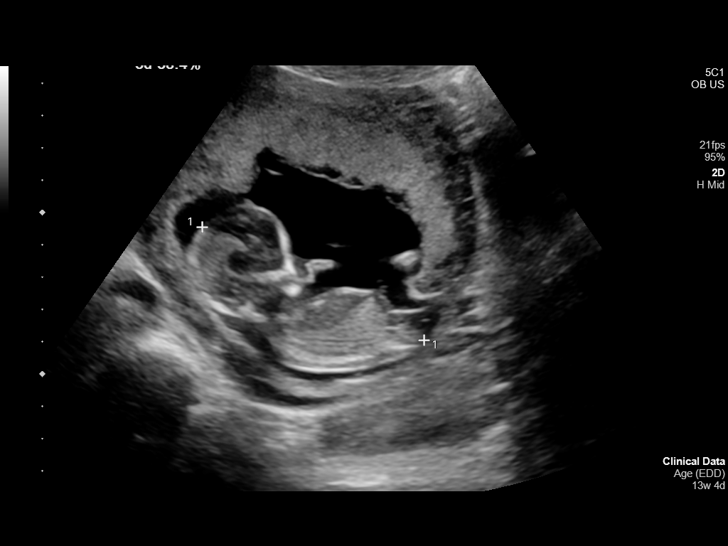
[im 10/52]
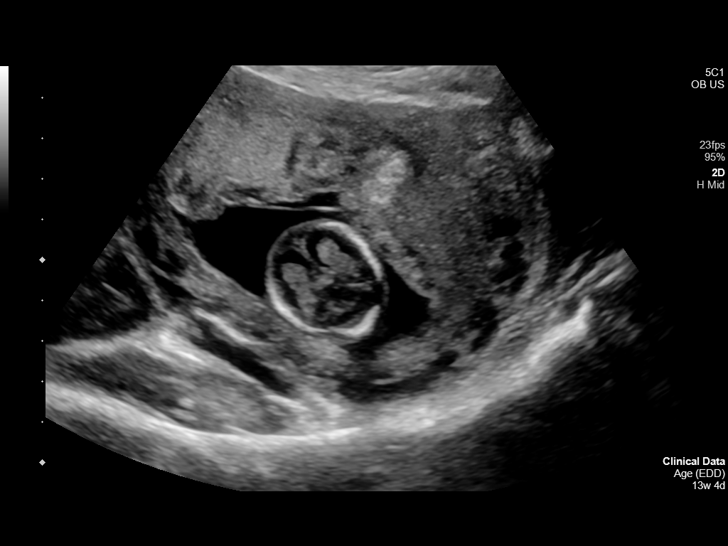
[im 14/52]
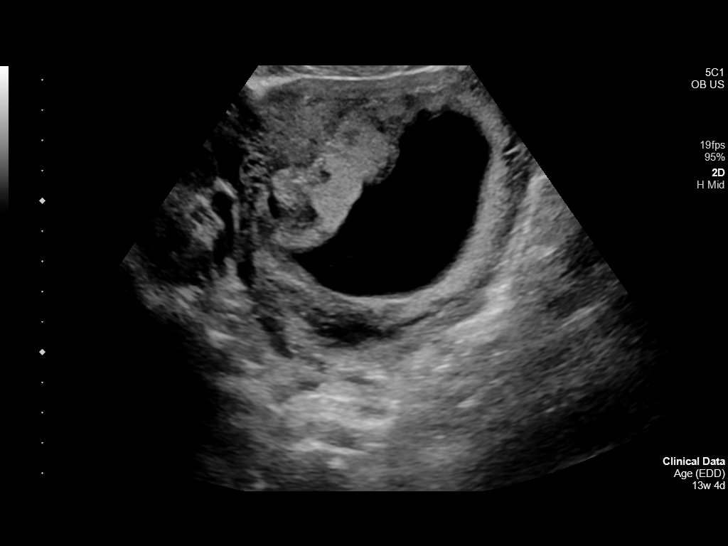
[im 18/52]
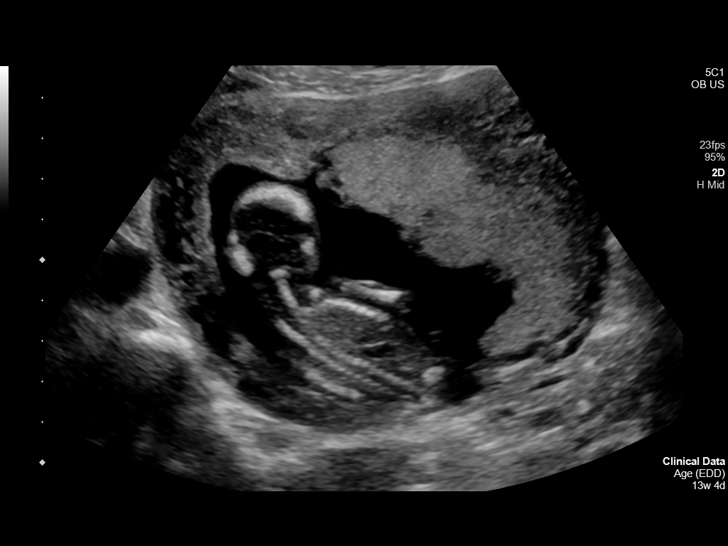
[im 21/52]
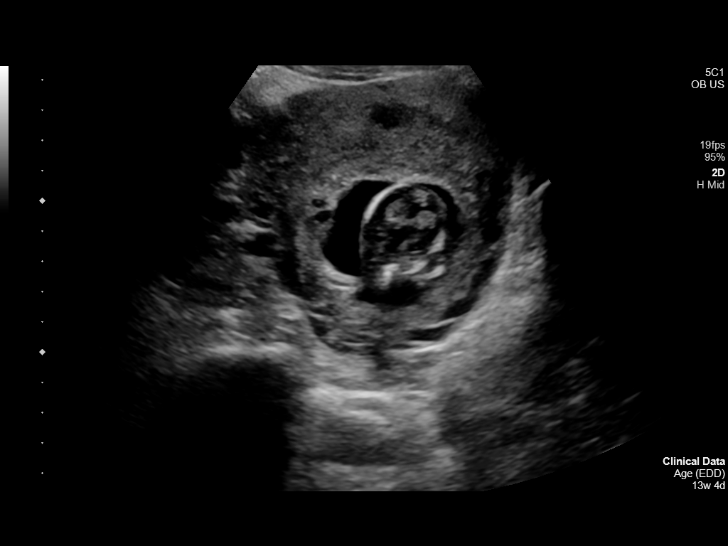
[im 25/52]
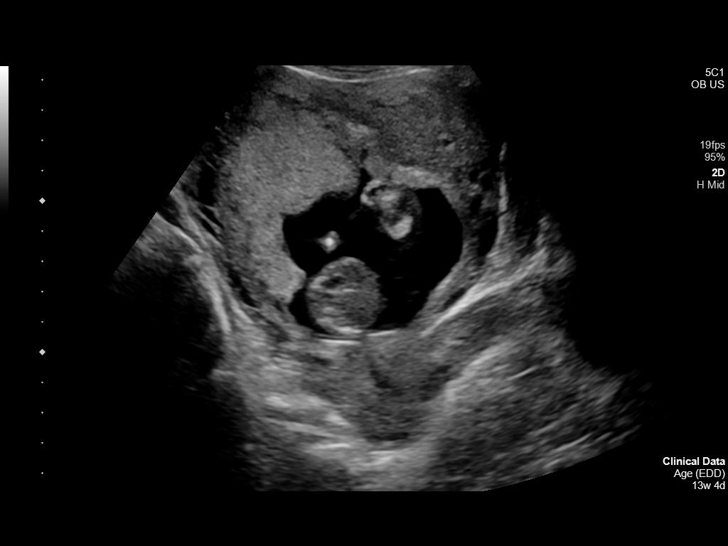
[im 29/52]
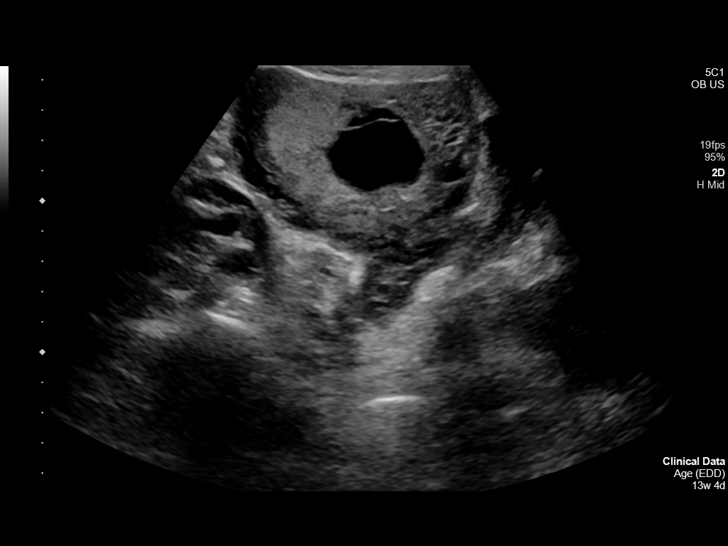
[im 33/52]
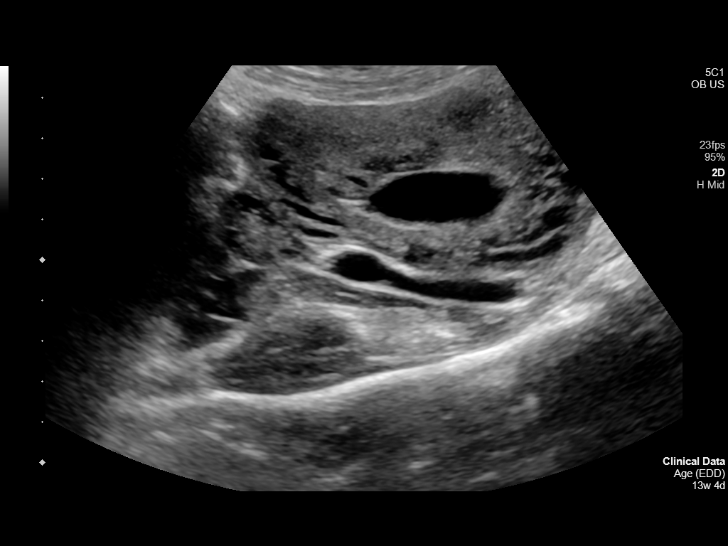
[im 36/52]
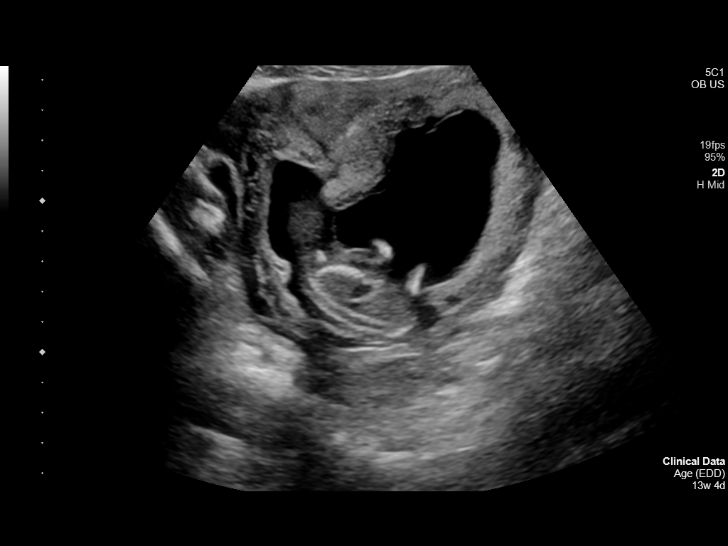
[im 40/52]
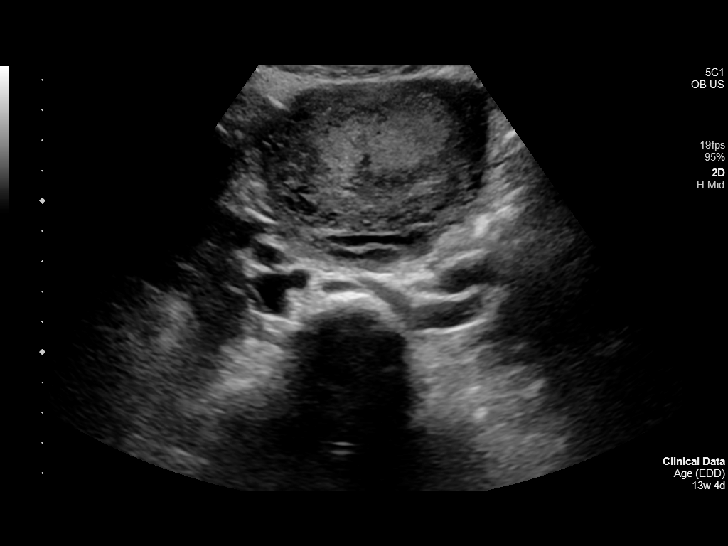
[im 44/52]
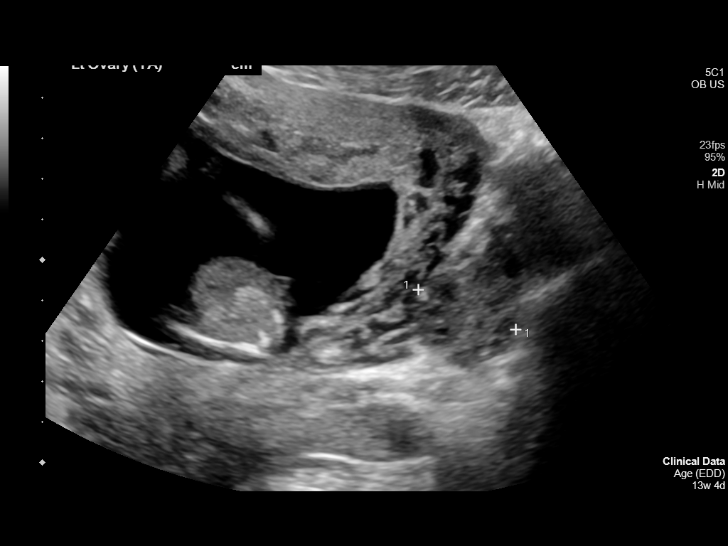
[im 48/52]
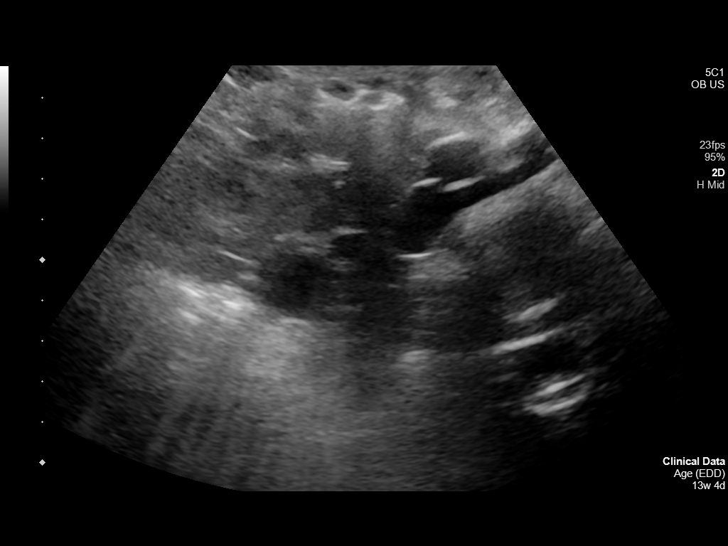
[im 52/52]
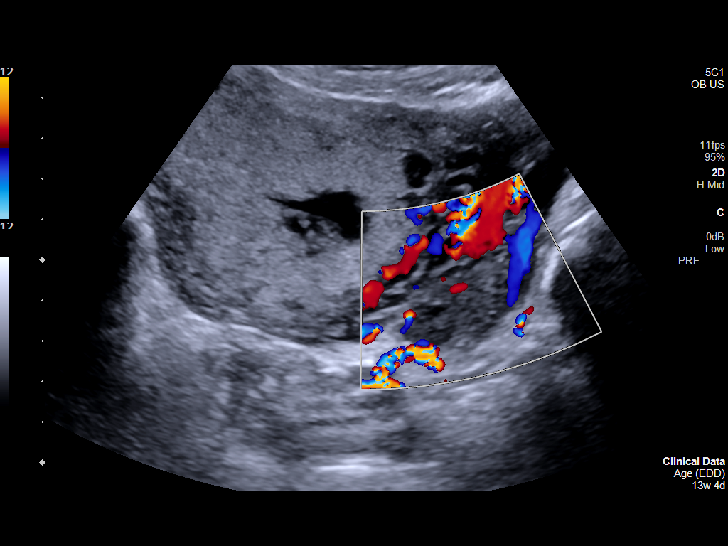

[14 of 28 positions shown; findings below may reference images not displayed]

FINDINGS: Intrauterine gestational sac: Visualized

Yolk sac:  Not visualized

Embryo:  Not visualized

Cardiac Activity: Visualized

Heart Rate: 157 bpm

CRL:   74 mm   13 w 4 d                  US EDC: December 30, 2020

Subchorionic hemorrhage:  None visualized.

Maternal uterus/adnexae: Cervical os is closed. Placental tissue is
seen anteriorly and fundal without abruption or previa evident.
Amniotic fluid volume appears normal for gestational age. Right
ovary measures 2.4 x 1.5 x 3.3 cm. Left ovary measures 3.5 x 2.0 x
2.6 cm. No extrauterine pelvic mass or free fluid.
IMPRESSION: Single live intrauterine gestation with estimated gestational age of
13+ weeks. Fetal growth is commensurate with findings from prior
sonographic evaluation. No placental abnormality. Amniotic fluid
volume within normal limits. Cervical os closed. No subchorionic
hemorrhage. No extrauterine pelvic mass or fluid evident.

## 2021-10-30 ENCOUNTER — Other Ambulatory Visit: Payer: Medicaid Other

## 2022-03-11 ENCOUNTER — Other Ambulatory Visit: Payer: Self-pay

## 2022-03-11 ENCOUNTER — Emergency Department
Admission: EM | Admit: 2022-03-11 | Discharge: 2022-03-13 | Disposition: A | Discharge status: Home / Self Care | Payer: No Typology Code available for payment source | Attending: Emergency Medicine | Admitting: Emergency Medicine

## 2022-03-11 ENCOUNTER — Emergency Department: Payer: No Typology Code available for payment source

## 2022-03-11 ENCOUNTER — Encounter: Payer: Self-pay | Admitting: Emergency Medicine

## 2022-03-11 DIAGNOSIS — F29 Unspecified psychosis not due to a substance or known physiological condition: Secondary | ICD-10-CM | POA: Insufficient documentation

## 2022-03-11 DIAGNOSIS — I10 Essential (primary) hypertension: Secondary | ICD-10-CM | POA: Diagnosis not present

## 2022-03-11 DIAGNOSIS — B9689 Other specified bacterial agents as the cause of diseases classified elsewhere: Secondary | ICD-10-CM | POA: Diagnosis not present

## 2022-03-11 DIAGNOSIS — Z79899 Other long term (current) drug therapy: Secondary | ICD-10-CM | POA: Insufficient documentation

## 2022-03-11 DIAGNOSIS — Z91040 Latex allergy status: Secondary | ICD-10-CM | POA: Diagnosis not present

## 2022-03-11 DIAGNOSIS — Z20822 Contact with and (suspected) exposure to covid-19: Secondary | ICD-10-CM | POA: Insufficient documentation

## 2022-03-11 DIAGNOSIS — N390 Urinary tract infection, site not specified: Secondary | ICD-10-CM | POA: Diagnosis not present

## 2022-03-11 DIAGNOSIS — F259 Schizoaffective disorder, unspecified: Secondary | ICD-10-CM | POA: Insufficient documentation

## 2022-03-11 DIAGNOSIS — F1290 Cannabis use, unspecified, uncomplicated: Secondary | ICD-10-CM | POA: Diagnosis not present

## 2022-03-11 DIAGNOSIS — F250 Schizoaffective disorder, bipolar type: Secondary | ICD-10-CM | POA: Diagnosis not present

## 2022-03-11 DIAGNOSIS — R4182 Altered mental status, unspecified: Secondary | ICD-10-CM | POA: Diagnosis present

## 2022-03-11 DIAGNOSIS — N9489 Other specified conditions associated with female genital organs and menstrual cycle: Secondary | ICD-10-CM | POA: Insufficient documentation

## 2022-03-11 MED ORDER — ZIPRASIDONE MESYLATE 20 MG IM SOLR
10.0000 mg | Freq: Once | INTRAMUSCULAR | Status: AC
  Administered 2022-03-11: 10 mg via INTRAMUSCULAR

## 2022-03-11 MED ORDER — ZIPRASIDONE MESYLATE 20 MG IM SOLR
INTRAMUSCULAR | Status: AC
  Filled 2022-03-11: qty 20

## 2022-03-11 NOTE — ED Notes (Signed)
Pt singing increasingly louder, refusing to answer questions.  Suddenly started ripping off leads, rocking back and forth, swinging arms wildly and pulling at her hair.  Staff attempted to calm pt, pt not able to be directed at this time.

## 2022-03-11 NOTE — ED Triage Notes (Signed)
Pt arrives via [facility],called pit by husband d/t not eating, sleeping, taking meds x 3-4 days.  Pt states that she does not feel like she needs her medications.  BP for EMS 230/149, HR 136.  Pt speaking excessively soft at this time with delayed responses to questions. AOX4.

## 2022-03-12 DIAGNOSIS — F250 Schizoaffective disorder, bipolar type: Secondary | ICD-10-CM | POA: Diagnosis not present

## 2022-03-12 LAB — CBC WITH DIFFERENTIAL/PLATELET
Abs Immature Granulocytes: 0.01 10*3/uL (ref 0.00–0.07)
Basophils Absolute: 0 10*3/uL (ref 0.0–0.1)
Basophils Relative: 1 %
Eosinophils Absolute: 0 10*3/uL (ref 0.0–0.5)
Eosinophils Relative: 0 %
HCT: 39.4 % (ref 36.0–46.0)
Hemoglobin: 13.1 g/dL (ref 12.0–15.0)
Immature Granulocytes: 0 %
Lymphocytes Relative: 32 %
Lymphs Abs: 2 10*3/uL (ref 0.7–4.0)
MCH: 29 pg (ref 26.0–34.0)
MCHC: 33.2 g/dL (ref 30.0–36.0)
MCV: 87.4 fL (ref 80.0–100.0)
Monocytes Absolute: 0.6 10*3/uL (ref 0.1–1.0)
Monocytes Relative: 9 %
Neutro Abs: 3.6 10*3/uL (ref 1.7–7.7)
Neutrophils Relative %: 58 %
Platelets: 226 10*3/uL (ref 150–400)
RBC: 4.51 MIL/uL (ref 3.87–5.11)
RDW: 12.4 % (ref 11.5–15.5)
WBC: 6.2 10*3/uL (ref 4.0–10.5)
nRBC: 0 % (ref 0.0–0.2)

## 2022-03-12 LAB — SALICYLATE LEVEL: Salicylate Lvl: <7 mg/dL — ABNORMAL LOW (ref 7.0–30.0)

## 2022-03-12 LAB — COMPREHENSIVE METABOLIC PANEL
ALT: 14 U/L (ref 0–44)
AST: 17 U/L (ref 15–41)
Albumin: 4.2 g/dL (ref 3.5–5.0)
Alkaline Phosphatase: 77 U/L (ref 38–126)
Anion gap: 10 (ref 5–15)
BUN: 11 mg/dL (ref 6–20)
CO2: 23 mmol/L (ref 22–32)
Calcium: 9.6 mg/dL (ref 8.9–10.3)
Chloride: 109 mmol/L (ref 98–111)
Creatinine, Ser: 0.77 mg/dL (ref 0.44–1.00)
GFR, Estimated: >60 mL/min (ref >60)
Glucose, Bld: 95 mg/dL (ref 70–99)
Potassium: 3.2 mmol/L — ABNORMAL LOW (ref 3.5–5.1)
Sodium: 142 mmol/L (ref 135–145)
Total Bilirubin: 0.5 mg/dL (ref 0.3–1.2)
Total Protein: 7.9 g/dL (ref 6.5–8.1)

## 2022-03-12 LAB — URINALYSIS, ROUTINE W REFLEX MICROSCOPIC
Bilirubin Urine: NEGATIVE
Glucose, UA: NEGATIVE mg/dL
Ketones, ur: 20 mg/dL — AB
Nitrite: NEGATIVE
Protein, ur: 30 mg/dL — AB
Specific Gravity, Urine: 1.017 (ref 1.005–1.030)
WBC, UA: >50 WBC/hpf — ABNORMAL HIGH (ref 0–5)
pH: 6 (ref 5.0–8.0)

## 2022-03-12 LAB — RESP PANEL BY RT-PCR (FLU A&B, COVID) ARPGX2
Influenza A by PCR: NEGATIVE
Influenza B by PCR: NEGATIVE
SARS Coronavirus 2 by RT PCR: NEGATIVE

## 2022-03-12 LAB — URINE DRUG SCREEN, QUALITATIVE (ARMC ONLY)
Amphetamines, Ur Screen: NOT DETECTED
Barbiturates, Ur Screen: NOT DETECTED
Benzodiazepine, Ur Scrn: NOT DETECTED
Cannabinoid 50 Ng, Ur ~~LOC~~: POSITIVE — AB
Cocaine Metabolite,Ur ~~LOC~~: NOT DETECTED
MDMA (Ecstasy)Ur Screen: NOT DETECTED
Methadone Scn, Ur: NOT DETECTED
Opiate, Ur Screen: NOT DETECTED
Phencyclidine (PCP) Ur S: NOT DETECTED
Tricyclic, Ur Screen: NOT DETECTED

## 2022-03-12 LAB — TSH: TSH: 0.682 u[IU]/mL (ref 0.350–4.500)

## 2022-03-12 LAB — ETHANOL: Alcohol, Ethyl (B): <10 mg/dL (ref <10)

## 2022-03-12 LAB — TROPONIN I (HIGH SENSITIVITY)
Troponin I (High Sensitivity): 12 ng/L (ref <18)
Troponin I (High Sensitivity): 12 ng/L (ref <18)

## 2022-03-12 LAB — POC URINE PREG, ED: Preg Test, Ur: NEGATIVE

## 2022-03-12 LAB — ACETAMINOPHEN LEVEL: Acetaminophen (Tylenol), Serum: <10 ug/mL — ABNORMAL LOW (ref 10–30)

## 2022-03-12 LAB — HCG, QUANTITATIVE, PREGNANCY: hCG, Beta Chain, Quant, S: 1 m[IU]/mL (ref <5)

## 2022-03-12 LAB — T4, FREE: Free T4: 1.5 ng/dL — ABNORMAL HIGH (ref 0.61–1.12)

## 2022-03-12 MED ORDER — NIFEDIPINE ER 60 MG PO TB24
60.0000 mg | ORAL_TABLET | Freq: Every day | ORAL | Status: DC
  Administered 2022-03-12 – 2022-03-13 (×2): 60 mg via ORAL
  Filled 2022-03-12 (×2): qty 1

## 2022-03-12 MED ORDER — CEPHALEXIN 500 MG PO CAPS
500.0000 mg | ORAL_CAPSULE | Freq: Three times a day (TID) | ORAL | Status: DC
  Administered 2022-03-12 – 2022-03-13 (×3): 500 mg via ORAL
  Filled 2022-03-12 (×4): qty 1

## 2022-03-12 MED ORDER — OLANZAPINE 5 MG PO TBDP: 10.0000 mg | ORAL_TABLET | Freq: Every day | ORAL | Status: DC

## 2022-03-12 MED ORDER — ZIPRASIDONE MESYLATE 20 MG IM SOLR: 10.0000 mg | Freq: Once | INTRAMUSCULAR | Status: DC

## 2022-03-12 MED ORDER — POTASSIUM CHLORIDE CRYS ER 20 MEQ PO TBCR
40.0000 meq | EXTENDED_RELEASE_TABLET | Freq: Once | ORAL | Status: AC
  Administered 2022-03-12: 40 meq via ORAL
  Filled 2022-03-12: qty 2

## 2022-03-12 MED ORDER — SODIUM CHLORIDE 0.9 % IV BOLUS
1000.0000 mL | Freq: Once | INTRAVENOUS | Status: AC
  Administered 2022-03-13: 1000 mL via INTRAVENOUS

## 2022-03-12 MED ORDER — SODIUM CHLORIDE 0.9 % IV BOLUS
500.0000 mL | Freq: Once | INTRAVENOUS | Status: AC
Start: 2022-03-12 — End: 2022-03-12
  Administered 2022-03-12: 500 mL via INTRAVENOUS

## 2022-03-12 MED ORDER — LORAZEPAM 2 MG/ML IJ SOLN
2.0000 mg | Freq: Once | INTRAMUSCULAR | Status: AC
  Administered 2022-03-12: 2 mg via INTRAMUSCULAR
  Filled 2022-03-12: qty 1

## 2022-03-12 MED ORDER — NIFEDIPINE ER 60 MG PO TB24
60.0000 mg | ORAL_TABLET | Freq: Once | ORAL | Status: AC
  Administered 2022-03-12: 60 mg via ORAL
  Filled 2022-03-12: qty 1

## 2022-03-12 MED ORDER — HYDROXYZINE HCL 25 MG PO TABS: 25.0000 mg | ORAL_TABLET | Freq: Three times a day (TID) | ORAL | Status: DC | PRN

## 2022-03-12 MED ORDER — ACETAMINOPHEN 500 MG PO TABS
1000.0000 mg | ORAL_TABLET | Freq: Once | ORAL | Status: AC
  Administered 2022-03-12: 1000 mg via ORAL
  Filled 2022-03-12: qty 2

## 2022-03-12 MED ORDER — CLONIDINE HCL 0.1 MG PO TABS
0.1000 mg | ORAL_TABLET | Freq: Once | ORAL | Status: AC
  Administered 2022-03-12: 0.1 mg via ORAL
  Filled 2022-03-12: qty 1

## 2022-03-12 MED ORDER — OLANZAPINE 5 MG PO TBDP
5.0000 mg | ORAL_TABLET | Freq: Every day | ORAL | Status: DC
  Filled 2022-03-12: qty 1

## 2022-03-12 MED ORDER — HALOPERIDOL LACTATE 5 MG/ML IJ SOLN
5.0000 mg | Freq: Once | INTRAMUSCULAR | Status: AC
  Administered 2022-03-12: 5 mg via INTRAMUSCULAR
  Filled 2022-03-12: qty 1

## 2022-03-12 NOTE — ED Notes (Signed)
Pt becoming agitated at this time, stating "please don't kill me."  Refusing monitoring equipment as well.  This RN assured pt that she is safe and no one here will hurt her and agreed to let pt have a break from monitoring equipment. Security in room at this time.

## 2022-03-12 NOTE — ED Notes (Signed)
Pt able to ambulate to and back from bathroom with steady gait.  Allowed this RN to draw repeat labs and put all monitoring equipment back on.  Pt calm and cooperative at this time.

## 2022-03-12 NOTE — Consult Note (Signed)
[facility] Face-to-Face Psychiatry Consult   Reason for Consult:  psychotic sx for several days Referring Physician:  EDP Patient Identification: Alyssa Mathis MRN:  [id number] Principal Diagnosis: Schizoaffective disorder (HCC) Diagnosis:  Principal Problem:   Schizoaffective disorder (HCC)   Total Time spent with patient: 45 minutes  Subjective:   Alyssa Mathis is a 36 y.o. female patient admitted with psychotic behaviors, not taking medications. [name]  HPI:  Patient has a history of schizoaffective disorder, bipolar type. She presented to ED via EMS, called by her husband.  Patient has not been taking her medication for the last month or so.  She has poor appetite, paranoid. On evaluation, patient was seen in religious songs.  Patient was able to sit down and talk to Clinical research associate.  She states that she knows she is in the hospital.  She says she got a shot.  She says that she did stop taking her medication but "I started taking it again.  She denies suicidal or homicidal ideation.  She admits to auditory or visual hallucinations, but is not clear about what they are.  She does appear to be a bit more coherent in her notes indicate.  She states that she is willing to take her medications.  Writer advised patient that she will need to be hospitalized to continue on her medication for stabilization.   Past Psychiatric History: Schizoaffective disorder  Risk to Self:   Risk to Others:   Prior Inpatient Therapy:   Prior Outpatient Therapy:    Past Medical History:  Past Medical History:  Diagnosis Date   Anemia    Hypertension    Post partum depression    Pregnancy induced hypertension    Schizoaffective disorder (HCC)     Past Surgical History:  Procedure Laterality Date   NO PAST SURGERIES     Family History:  Family History  Problem Relation Age of Onset   Hypertension Sister    Hypertension Maternal Grandmother    Diabetes Maternal Grandfather    Hypertension Maternal Grandfather     Family Psychiatric  History: Unknown Social History:  Social History   Substance and Sexual Activity  Alcohol Use Never     Social History   Substance and Sexual Activity  Drug Use Not Currently    Social History   Socioeconomic History   Marital status: Single    Spouse name: Not on file   Number of children: Not on file   Years of education: Not on file   Highest education level: Not on file  Occupational History   Not on file  Tobacco Use   Smoking status: Every Day    Types: Cigarettes   Smokeless tobacco: Never  Vaping Use   Vaping Use: Not on file  Substance and Sexual Activity   Alcohol use: Never   Drug use: Not Currently   Sexual activity: Yes  Other Topics Concern   Not on file  Social History Narrative   Not on file   Social Determinants of Health   Financial Resource Strain: Not on file  Food Insecurity: Not on file  Transportation Needs: Not on file  Physical Activity: Not on file  Stress: Not on file  Social Connections: Not on file   Additional Social History:    Allergies:   Allergies  Allergen Reactions   Paliperidone Other (See Comments)    Dystonia   Latex Rash    Labs:  Results for orders placed or performed during the hospital encounter of 03/11/22 (from  the past 48 hour(s))  hCG, quantitative, pregnancy     Status: None   Collection Time: 03/12/22  1:38 AM  Result Value Ref Range   hCG, Beta Chain, Quant, S 1 <5 mIU/mL    Comment:          GEST. AGE      CONC.  (mIU/mL)   <=1 WEEK        5 - 50     2 WEEKS       50 - 500     3 WEEKS       100 - 10,000     4 WEEKS     1,000 - 30,000     5 WEEKS     3,500 - 115,000   6-8 WEEKS     12,000 - 270,000    12 WEEKS     15,000 - 220,000        FEMALE AND NON-PREGNANT FEMALE:     LESS THAN 5 mIU/mL Performed at [facility], [address] [place], [place] [address]   CBC with Differential     Status: None   Collection Time: 03/12/22  1:39 AM  Result Value Ref  Range   WBC 6.2 4.0 - 10.5 K/uL   RBC 4.51 3.87 - 5.11 MIL/uL   Hemoglobin 13.1 12.0 - 15.0 g/dL   HCT 19.139.4 47.836.0 - 29.546.0 %   MCV 87.4 80.0 - 100.0 fL   MCH 29.0 26.0 - 34.0 pg   MCHC 33.2 30.0 - 36.0 g/dL   RDW 62.112.4 30.811.5 - 65.715.5 %   Platelets 226 150 - 400 K/uL   nRBC 0.0 0.0 - 0.2 %   Neutrophils Relative % 58 %   Neutro Abs 3.6 1.7 - 7.7 K/uL   Lymphocytes Relative 32 %   Lymphs Abs 2.0 0.7 - 4.0 K/uL   Monocytes Relative 9 %   Monocytes Absolute 0.6 0.1 - 1.0 K/uL   Eosinophils Relative 0 %   Eosinophils Absolute 0.0 0.0 - 0.5 K/uL   Basophils Relative 1 %   Basophils Absolute 0.0 0.0 - 0.1 K/uL   Immature Granulocytes 0 %   Abs Immature Granulocytes 0.01 0.00 - 0.07 K/uL    Comment: Performed at [facility], [address] [place], [place] [address]  Comprehensive metabolic panel     Status: Abnormal   Collection Time: 03/12/22  1:39 AM  Result Value Ref Range   Sodium 142 135 - 145 mmol/L   Potassium 3.2 (L) 3.5 - 5.1 mmol/L   Chloride 109 98 - 111 mmol/L   CO2 23 22 - 32 mmol/L   Glucose, Bld 95 70 - 99 mg/dL    Comment: Glucose reference range applies only to samples taken after fasting for at least 8 hours.   BUN 11 6 - 20 mg/dL   Creatinine, Ser 2.950.77 0.44 - 1.00 mg/dL   Calcium 9.6 8.9 - 28.410.3 mg/dL   Total Protein 7.9 6.5 - 8.1 g/dL   Albumin 4.2 3.5 - 5.0 g/dL   AST 17 15 - 41 U/L   ALT 14 0 - 44 U/L   Alkaline Phosphatase 77 38 - 126 U/L   Total Bilirubin 0.5 0.3 - 1.2 mg/dL   GFR, Estimated >13>60 >24>60 mL/min    Comment: (NOTE) Calculated using the CKD-EPI Creatinine Equation (2021)    Anion gap 10 5 - 15    Comment: Performed at [facility], [address] [place], [place]  [address]  Ethanol     Status: None   Collection Time: 03/12/22  1:39 AM  Result Value Ref Range   Alcohol, Ethyl (B) <10 <10 mg/dL    Comment: (NOTE) Lowest detectable limit for serum alcohol is 10 mg/dL.  For medical purposes only. Performed at [facility], [address] [place], [place] [address]   Acetaminophen level     Status: Abnormal   Collection Time: 03/12/22  1:39 AM  Result Value Ref Range   Acetaminophen (Tylenol), Serum <10 (L) 10 - 30 ug/mL    Comment: (NOTE) Therapeutic concentrations vary significantly. A range of 10-30 ug/mL  may be an effective concentration for many patients. However, some  are best treated at concentrations outside of this range. Acetaminophen concentrations >150 ug/mL at 4 hours after ingestion  and >50 ug/mL at 12 hours after ingestion are often associated with  toxic reactions.  Performed at [facility], [address] [place], [place] [address]   Salicylate level     Status: Abnormal   Collection Time: 03/12/22  1:39 AM  Result Value Ref Range   Salicylate Lvl <7.0 (L) 7.0 - 30.0 mg/dL    Comment: Performed at [facility], [address] [place], [place] [address]  Troponin I (High Sensitivity)     Status: None   Collection Time: 03/12/22  1:39 AM  Result Value Ref Range   Troponin I (High Sensitivity) 12 <18 ng/L    Comment: (NOTE) Elevated high sensitivity troponin I (hsTnI) values and significant  changes across serial measurements may suggest ACS but many other  chronic and acute conditions are known to elevate hsTnI results.  Refer to the "Links" section for chest pain algorithms and additional  guidance. Performed at [facility], [address] [place], [place] [address]   Resp Panel by RT-PCR (Flu A&B, Covid) Anterior Nasal Swab     Status: None   Collection Time: 03/12/22  1:40 AM   Specimen: Anterior Nasal Swab  Result Value Ref Range   SARS Coronavirus 2 by RT PCR NEGATIVE NEGATIVE    Comment: (NOTE) SARS-CoV-2 target nucleic acids are NOT DETECTED.  The SARS-CoV-2 RNA is generally detectable in upper respiratory specimens during the acute phase of infection. The lowest concentration of SARS-CoV-2 viral copies this assay  can detect is 138 copies/mL. A negative result does not preclude SARS-Cov-2 infection and should not be used as the sole basis for treatment or other patient management decisions. A negative result may occur with  improper specimen collection/handling, submission of specimen other than nasopharyngeal swab, presence of viral mutation(s) within the areas targeted by this assay, and inadequate number of viral copies(<138 copies/mL). A negative result must be combined with clinical observations, patient history, and epidemiological information. The expected result is Negative.  Fact Sheet for Patients:  BloggerCourse.com  Fact Sheet for Healthcare Providers:  SeriousBroker.it  This test is no t yet approved or cleared by the [place] FDA and  has been authorized for detection and/or diagnosis of SARS-CoV-2 by FDA under an Emergency Use Authorization (EUA). This EUA will remain  in effect (meaning this test can be used) for the duration of the COVID-19 declaration under Section 564(b)(1) of the Act, 21 U.S.C.section 360bbb-3(b)(1), unless the authorization is terminated  or revoked sooner.       Influenza A by PCR NEGATIVE NEGATIVE   Influenza B by PCR NEGATIVE NEGATIVE    Comment: (NOTE) The Xpert Xpress SARS-CoV-2/FLU/RSV plus assay  is intended as an aid in the diagnosis of influenza from Nasopharyngeal swab specimens and should not be used as a sole basis for treatment. Nasal washings and aspirates are unacceptable for Xpert Xpress SARS-CoV-2/FLU/RSV testing.  Fact Sheet for Patients: BloggerCourse.com  Fact Sheet for Healthcare Providers: SeriousBroker.it  This test is not yet approved or cleared by the [place] FDA and has been authorized for detection and/or diagnosis of SARS-CoV-2 by FDA under an Emergency Use Authorization (EUA). This EUA will remain in effect  (meaning this test can be used) for the duration of the COVID-19 declaration under Section 564(b)(1) of the Act, 21 U.S.C. section 360bbb-3(b)(1), unless the authorization is terminated or revoked.  Performed at [facility], [address] [place], [place] [address]   Urinalysis, Routine w reflex microscopic Urine, Unspecified Source     Status: Abnormal   Collection Time: 03/12/22  2:45 AM  Result Value Ref Range   Color, Urine YELLOW (A) YELLOW   APPearance HAZY (A) CLEAR   Specific Gravity, Urine 1.017 1.005 - 1.030   pH 6.0 5.0 - 8.0   Glucose, UA NEGATIVE NEGATIVE mg/dL   Hgb urine dipstick LARGE (A) NEGATIVE   Bilirubin Urine NEGATIVE NEGATIVE   Ketones, ur 20 (A) NEGATIVE mg/dL   Protein, ur 30 (A) NEGATIVE mg/dL   Nitrite NEGATIVE NEGATIVE   Leukocytes,Ua MODERATE (A) NEGATIVE   RBC / HPF 11-20 0 - 5 RBC/hpf   WBC, UA >50 (H) 0 - 5 WBC/hpf   Bacteria, UA RARE (A) NONE SEEN   Squamous Epithelial / LPF 6-10 0 - 5   Mucus PRESENT    Hyaline Casts, UA PRESENT     Comment: Performed at [facility], [address], [place], [place] [address]  Urine Drug Screen, Qualitative     Status: Abnormal   Collection Time: 03/12/22  2:45 AM  Result Value Ref Range   Tricyclic, Ur Screen NONE DETECTED NONE DETECTED   Amphetamines, Ur Screen NONE DETECTED NONE DETECTED   MDMA (Ecstasy)Ur Screen NONE DETECTED NONE DETECTED   Cocaine Metabolite,Ur [place] NONE DETECTED NONE DETECTED   Opiate, Ur Screen NONE DETECTED NONE DETECTED   Phencyclidine (PCP) Ur S NONE DETECTED NONE DETECTED   Cannabinoid 50 Ng, Ur  POSITIVE (A) NONE DETECTED   Barbiturates, Ur Screen NONE DETECTED NONE DETECTED   Benzodiazepine, Ur Scrn NONE DETECTED NONE DETECTED   Methadone Scn, Ur NONE DETECTED NONE DETECTED    Comment: (NOTE) Tricyclics + metabolites, urine    Cutoff 1000 ng/mL Amphetamines + metabolites, urine  Cutoff 1000 ng/mL MDMA (Ecstasy), urine              Cutoff 500  ng/mL Cocaine Metabolite, urine          Cutoff 300 ng/mL Opiate + metabolites, urine        Cutoff 300 ng/mL Phencyclidine (PCP), urine         Cutoff 25 ng/mL Cannabinoid, urine                 Cutoff 50 ng/mL Barbiturates + metabolites, urine  Cutoff 200 ng/mL Benzodiazepine, urine              Cutoff 200 ng/mL Methadone, urine                   Cutoff 300 ng/mL  The urine drug screen provides only a preliminary, unconfirmed analytical test result and should not be used for non-medical purposes. Clinical consideration and  professional judgment should be applied to any positive drug screen result due to possible interfering substances. A more specific alternate chemical method must be used in order to obtain a confirmed analytical result. Gas chromatography / mass spectrometry (GC/MS) is the preferred confirm atory method. Performed at [facility], [address] [place], [place] [address]   POC urine preg, ED     Status: None   Collection Time: 03/12/22  2:48 AM  Result Value Ref Range   Preg Test, Ur NEGATIVE NEGATIVE    Comment:        THE SENSITIVITY OF THIS METHODOLOGY IS >24 mIU/mL   Troponin I (High Sensitivity)     Status: None   Collection Time: 03/12/22  3:29 AM  Result Value Ref Range   Troponin I (High Sensitivity) 12 <18 ng/L    Comment: (NOTE) Elevated high sensitivity troponin I (hsTnI) values and significant  changes across serial measurements may suggest ACS but many other  chronic and acute conditions are known to elevate hsTnI results.  Refer to the "Links" section for chest pain algorithms and additional  guidance. Performed at [facility], [address], [place], [place] [address]     Current Facility-Administered Medications  Medication Dose Route Frequency Provider Last Rate Last Admin   cephALEXin (KEFLEX) capsule 500 mg  500 mg Oral Q8H Irean Hong, MD   500 mg at 03/12/22 0427   NIFEdipine (ADALAT CC) 24 hr tablet 60  mg  60 mg Oral Daily Arnaldo Natal, MD   60 mg at 03/12/22 1219   ziprasidone (GEODON) injection 10 mg  10 mg Intramuscular Once Irean Hong, MD       Current Outpatient Medications  Medication Sig Dispense Refill   acetaminophen (TYLENOL) 325 MG tablet Take 2 tablets (650 mg total) by mouth every 4 (four) hours as needed (for pain scale < 4).     NIFEdipine (PROCARDIA XL/NIFEDICAL XL) 60 MG 24 hr tablet Take 1 tablet (60 mg total) by mouth daily. 30 tablet 5   OLANZapine (ZYPREXA) 20 MG tablet Take 20 mg by mouth at bedtime.     OLANZapine (ZYPREXA) 5 MG tablet Take 1 tablet (5 mg total) by mouth at bedtime. 30 tablet 5    Musculoskeletal: Strength & Muscle Tone: within normal limits Gait & Station: normal Patient leans: N/A            Psychiatric Specialty Exam:  Presentation  General Appearance: Appropriate for Environment  Eye Contact:Good  Speech:Blocked (appears improved from earlier)  Speech Volume:Normal  Handedness:No data recorded  Mood and Affect  Mood:Dysphoric  Affect:Blunt   Thought Process  Thought Processes:Disorganized  Descriptions of Associations:No data recorded Orientation:Full (Time, Place and Person)  Thought Content:Scattered  History of Schizophrenia/Schizoaffective disorder:Yes  Duration of Psychotic Symptoms:Greater than six months  Hallucinations:Hallucinations: Auditory  Ideas of Reference:Delusions  Suicidal Thoughts:Suicidal Thoughts: No  Homicidal Thoughts:Homicidal Thoughts: No   Sensorium  Memory:Immediate Poor  Judgment:Poor  Insight:Fair   Executive Functions  Concentration:Fair  Attention Span:Fair  Recall:Fair  Fund of Knowledge:Fair  Language:Fair   Psychomotor Activity  Psychomotor Activity:Psychomotor Activity: Normal   Assets  Assets:Resilience; Social Support; Housing; Intimacy; Financial Resources/Insurance   Sleep  Sleep:Sleep: Fair   Physical Exam: Physical Exam Vitals  and nursing note reviewed.  HENT:     Head: Normocephalic.     Nose: No congestion or rhinorrhea.  Eyes:     General:        Right  eye: No discharge.        Left eye: No discharge.  Cardiovascular:     Rate and Rhythm: Normal rate.  Pulmonary:     Effort: Pulmonary effort is normal.  Musculoskeletal:        General: Normal range of motion.     Cervical back: Normal range of motion.  Skin:    General: Skin is dry.  Neurological:     Mental Status: She is alert.  Psychiatric:        Attention and Perception: Attention normal.        Mood and Affect: Mood is anxious. Affect is blunt.        Speech: Speech is tangential.        Behavior: Behavior is cooperative.        Thought Content: Thought content is delusional. Thought content does not include homicidal or suicidal ideation.        Cognition and Memory: Cognition is impaired.        Judgment: Judgment is impulsive.    Review of Systems  Constitutional: Negative.   Eyes: Negative.   Respiratory: Negative.    Neurological: Negative.   Psychiatric/Behavioral:  Positive for depression and hallucinations. Negative for substance abuse and suicidal ideas.    Blood pressure (!) 157/112, pulse 75, temperature 98.7 F (37.1 C), temperature source Oral, resp. rate 16, height 5\' 5"  (1.651 m), weight 63.5 kg, SpO2 98 %, unknown if currently breastfeeding. Body mass index is 23.3 kg/m.  Treatment Plan Summary: Daily contact with patient to assess and evaluate symptoms and progress in treatment, Medication management, and Plan admit to psychiatry inpatient . Pharmacy rec ordered  Disposition: Recommend psychiatric Inpatient admission when medically cleared.  , NP 03/12/2022 1:09 PM

## 2022-03-12 NOTE — ED Notes (Signed)
Pt was able to ambulate to the bathroom and back with steady gait/provided a urine sample.

## 2022-03-12 NOTE — BH Assessment (Signed)
Writer called and left a HIPPA Compliant message with mother (Channa Wright-[phone]), requesting a return phone call.

## 2022-03-12 NOTE — ED Notes (Addendum)
Pt belongings: pink leggings, plaid shirt, sneakers, and a large blanket

## 2022-03-12 NOTE — BH Assessment (Signed)
Patient is to be admitted to [facility] tonight after 8:30pm pending blood pressure results by Dr. Toni Amend.  Attending Physician will be Dr.  Toni Amend .   Patient has been assigned to room 306, by [facility] Charge Nurse, Kayliegh Boyers.    ER staff is aware of the admission: Misty Stanley, ER Secretary   Dr. Sidney Ace, ER MD  Annette Stable, Patient's Nurse  Sue Lush, Patient Access.

## 2022-03-12 NOTE — ED Notes (Signed)
Pt given sandwich tray and encouraged to eat at this time.  Pt continues to speak excessively softly and is delayed in answering questions.  Some movements are delayed as well. Pt required much encouragement to take PO medications. This RN explained what conditions the medications were to be taken for multiple times.  The patient was able to swallow the medications with applesauce after approx 10 minutes of direction.

## 2022-03-12 NOTE — ED Notes (Signed)
Pt in the shower at this time. Will check BP and Pulse when done.

## 2022-03-12 NOTE — ED Notes (Signed)
Pt was compliant with EKG and being put back on cardiac monitor.  Pt awake and back to how she was when she got here and answering questions with delayed responses. Started humming again which is how the agitation started earlier.  Unable to get labs at this time. EDP Sung notified.

## 2022-03-13 ENCOUNTER — Inpatient Hospital Stay
Admission: AD | Admit: 2022-03-13 | Discharge: 2022-04-03 | DRG: 885 | Disposition: A | Discharge status: Home / Self Care | Payer: No Typology Code available for payment source | Source: Intra-hospital | Attending: Psychiatry | Admitting: Psychiatry

## 2022-03-13 ENCOUNTER — Encounter: Payer: Self-pay | Admitting: Psychiatry

## 2022-03-13 ENCOUNTER — Other Ambulatory Visit: Payer: Self-pay

## 2022-03-13 DIAGNOSIS — F17210 Nicotine dependence, cigarettes, uncomplicated: Secondary | ICD-10-CM | POA: Diagnosis present

## 2022-03-13 DIAGNOSIS — R41843 Psychomotor deficit: Secondary | ICD-10-CM | POA: Diagnosis present

## 2022-03-13 DIAGNOSIS — I10 Essential (primary) hypertension: Secondary | ICD-10-CM | POA: Diagnosis present

## 2022-03-13 DIAGNOSIS — Z20822 Contact with and (suspected) exposure to covid-19: Secondary | ICD-10-CM | POA: Diagnosis present

## 2022-03-13 DIAGNOSIS — Z79899 Other long term (current) drug therapy: Secondary | ICD-10-CM | POA: Diagnosis not present

## 2022-03-13 DIAGNOSIS — F250 Schizoaffective disorder, bipolar type: Secondary | ICD-10-CM | POA: Diagnosis present

## 2022-03-13 DIAGNOSIS — N390 Urinary tract infection, site not specified: Secondary | ICD-10-CM | POA: Diagnosis present

## 2022-03-13 DIAGNOSIS — F259 Schizoaffective disorder, unspecified: Secondary | ICD-10-CM | POA: Diagnosis present

## 2022-03-13 DIAGNOSIS — Z888 Allergy status to other drugs, medicaments and biological substances status: Secondary | ICD-10-CM

## 2022-03-13 DIAGNOSIS — Z91040 Latex allergy status: Secondary | ICD-10-CM

## 2022-03-13 LAB — BASIC METABOLIC PANEL
Anion gap: 10 (ref 5–15)
BUN: 6 mg/dL (ref 6–20)
CO2: 22 mmol/L (ref 22–32)
Calcium: 9.5 mg/dL (ref 8.9–10.3)
Chloride: 107 mmol/L (ref 98–111)
Creatinine, Ser: 0.66 mg/dL (ref 0.44–1.00)
GFR, Estimated: >60 mL/min (ref >60)
Glucose, Bld: 96 mg/dL (ref 70–99)
Potassium: 3.1 mmol/L — ABNORMAL LOW (ref 3.5–5.1)
Sodium: 139 mmol/L (ref 135–145)

## 2022-03-13 LAB — CBC WITH DIFFERENTIAL/PLATELET
Abs Immature Granulocytes: 0.01 10*3/uL (ref 0.00–0.07)
Basophils Absolute: 0.1 10*3/uL (ref 0.0–0.1)
Basophils Relative: 1 %
Eosinophils Absolute: 0 10*3/uL (ref 0.0–0.5)
Eosinophils Relative: 1 %
HCT: 42.3 % (ref 36.0–46.0)
Hemoglobin: 14 g/dL (ref 12.0–15.0)
Immature Granulocytes: 0 %
Lymphocytes Relative: 38 %
Lymphs Abs: 2.2 10*3/uL (ref 0.7–4.0)
MCH: 29.1 pg (ref 26.0–34.0)
MCHC: 33.1 g/dL (ref 30.0–36.0)
MCV: 87.9 fL (ref 80.0–100.0)
Monocytes Absolute: 0.6 10*3/uL (ref 0.1–1.0)
Monocytes Relative: 11 %
Neutro Abs: 2.9 10*3/uL (ref 1.7–7.7)
Neutrophils Relative %: 49 %
Platelets: 240 10*3/uL (ref 150–400)
RBC: 4.81 MIL/uL (ref 3.87–5.11)
RDW: 12.4 % (ref 11.5–15.5)
WBC: 5.7 10*3/uL (ref 4.0–10.5)
nRBC: 0 % (ref 0.0–0.2)

## 2022-03-13 MED ORDER — HYDROXYZINE HCL 25 MG PO TABS
25.0000 mg | ORAL_TABLET | Freq: Three times a day (TID) | ORAL | Status: DC | PRN
  Administered 2022-03-20 – 2022-03-27 (×4): 25 mg via ORAL
  Filled 2022-03-13 (×5): qty 1

## 2022-03-13 MED ORDER — NIFEDIPINE ER 60 MG PO TB24
60.0000 mg | ORAL_TABLET | Freq: Every day | ORAL | Status: DC
  Administered 2022-03-13 – 2022-03-17 (×6): 60 mg via ORAL
  Filled 2022-03-13 (×5): qty 1

## 2022-03-13 MED ORDER — LORAZEPAM 2 MG PO TABS: 2.0000 mg | ORAL_TABLET | Freq: Four times a day (QID) | ORAL | Status: DC | PRN

## 2022-03-13 MED ORDER — SODIUM CHLORIDE 0.9 % IV BOLUS
1000.0000 mL | Freq: Once | INTRAVENOUS | Status: AC
  Administered 2022-03-13: 1000 mL via INTRAVENOUS

## 2022-03-13 MED ORDER — MAGNESIUM HYDROXIDE 400 MG/5ML PO SUSP
30.0000 mL | Freq: Every day | ORAL | Status: DC | PRN
  Administered 2022-03-15: 30 mL via ORAL
  Filled 2022-03-13: qty 30

## 2022-03-13 MED ORDER — OLANZAPINE 5 MG PO TBDP
10.0000 mg | ORAL_TABLET | Freq: Once | ORAL | Status: AC
  Administered 2022-03-13: 10 mg via ORAL
  Filled 2022-03-13: qty 2

## 2022-03-13 MED ORDER — POTASSIUM CHLORIDE 10 MEQ/100ML IV SOLN
10.0000 meq | INTRAVENOUS | Status: AC
  Administered 2022-03-13 (×2): 10 meq via INTRAVENOUS
  Filled 2022-03-13 (×2): qty 100

## 2022-03-13 MED ORDER — OLANZAPINE 5 MG PO TBDP
5.0000 mg | ORAL_TABLET | Freq: Every day | ORAL | Status: DC
Start: 2022-03-13 — End: 2022-03-13

## 2022-03-13 MED ORDER — ACETAMINOPHEN 325 MG PO TABS
650.0000 mg | ORAL_TABLET | Freq: Four times a day (QID) | ORAL | Status: DC | PRN
  Administered 2022-03-16: 650 mg via ORAL
  Filled 2022-03-13: qty 2

## 2022-03-13 MED ORDER — OLANZAPINE 5 MG PO TBDP
5.0000 mg | ORAL_TABLET | Freq: Two times a day (BID) | ORAL | Status: DC
  Administered 2022-03-13 – 2022-03-15 (×4): 5 mg via ORAL
  Filled 2022-03-13 (×5): qty 1

## 2022-03-13 MED ORDER — LORAZEPAM 2 MG/ML IJ SOLN
2.0000 mg | Freq: Four times a day (QID) | INTRAMUSCULAR | Status: DC | PRN
  Filled 2022-03-13: qty 1

## 2022-03-13 MED ORDER — LORAZEPAM 2 MG PO TABS
2.0000 mg | ORAL_TABLET | Freq: Four times a day (QID) | ORAL | Status: DC | PRN
  Administered 2022-03-14 – 2022-03-24 (×7): 2 mg via ORAL
  Filled 2022-03-13 (×9): qty 1

## 2022-03-13 MED ORDER — ALUM & MAG HYDROXIDE-SIMETH 200-200-20 MG/5ML PO SUSP: 30.0000 mL | ORAL | Status: DC | PRN

## 2022-03-13 MED ORDER — CEPHALEXIN 500 MG PO CAPS
500.0000 mg | ORAL_CAPSULE | Freq: Three times a day (TID) | ORAL | Status: AC
  Administered 2022-03-13 – 2022-03-19 (×15): 500 mg via ORAL
  Filled 2022-03-13 (×15): qty 1

## 2022-03-13 MED ORDER — POTASSIUM CHLORIDE CRYS ER 20 MEQ PO TBCR: 40.0000 meq | EXTENDED_RELEASE_TABLET | Freq: Once | ORAL | Status: DC

## 2022-03-13 MED ORDER — OLANZAPINE 10 MG PO TABS: 20.0000 mg | ORAL_TABLET | Freq: Every day | ORAL | Status: DC

## 2022-03-13 NOTE — ED Notes (Signed)
Pt in the shower at this time 

## 2022-03-13 NOTE — BHH Suicide Risk Assessment (Signed)
[facility] Admission Suicide Risk Assessment   Nursing information obtained from:    Demographic factors:    Current Mental Status:    Loss Factors:    Historical Factors:    Risk Reduction Factors:     Total Time spent with patient: 45 minutes Principal Problem: Schizoaffective disorder (HCC) Diagnosis:  Principal Problem:   Schizoaffective disorder (HCC)  Subjective Data: Patient seen and chart reviewed.  36 year old woman with a history of schizoaffective disorder presented to the emergency room with altered mental status.  Labile with mental status going between agitated and quiet and withdrawn.  No report of any suicidal or self-injuring behavior.  On interview today patient was unable to provide much information but was not showing any inclination to self-harm made no suicidal statements but appears to be unpredictable  Continued Clinical Symptoms:    The "Alcohol Use Disorders Identification Test", Guidelines for Use in Primary Care, Second Edition.  World Science writer [facility]). Score between 0-7:  no or low risk or alcohol related problems. Score between 8-15:  moderate risk of alcohol related problems. Score between 16-19:  high risk of alcohol related problems. Score 20 or above:  warrants further diagnostic evaluation for alcohol dependence and treatment.   CLINICAL FACTORS:   Bipolar Disorder:   Mixed State Schizophrenia:   Less than 57 years old   Musculoskeletal: Strength & Muscle Tone: within normal limits Gait & Station: normal Patient leans: N/A  Psychiatric Specialty Exam:  Presentation  General Appearance: Appropriate for Environment  Eye Contact:Good  Speech:Blocked (appears improved from earlier)  Speech Volume:Normal  Handedness:No data recorded  Mood and Affect  Mood:Dysphoric  Affect:Blunt   Thought Process  Thought Processes:Disorganized  Descriptions of Associations:No data recorded Orientation:Full (Time, Place and Person)  Thought  Content:Scattered  History of Schizophrenia/Schizoaffective disorder:Yes  Duration of Psychotic Symptoms:Greater than six months  Hallucinations:Hallucinations: Auditory  Ideas of Reference:Delusions  Suicidal Thoughts:Suicidal Thoughts: No  Homicidal Thoughts:Homicidal Thoughts: No   Sensorium  Memory:Immediate Poor  Judgment:Poor  Insight:Fair   Executive Functions  Concentration:Fair  Attention Span:Fair  Recall:Fair  Fund of Knowledge:Fair  Language:Fair   Psychomotor Activity  Psychomotor Activity:Psychomotor Activity: Normal   Assets  Assets:Resilience; Social Support; Housing; Intimacy; Financial Resources/Insurance   Sleep  Sleep:Sleep: Fair    Physical Exam: Physical Exam Vitals and nursing note reviewed.  Constitutional:      Appearance: Normal appearance.  HENT:     Head: Normocephalic and atraumatic.     Mouth/Throat:     Pharynx: Oropharynx is clear.  Eyes:     Pupils: Pupils are equal, round, and reactive to light.  Cardiovascular:     Rate and Rhythm: Normal rate and regular rhythm.  Pulmonary:     Effort: Pulmonary effort is normal.     Breath sounds: Normal breath sounds.  Abdominal:     General: Abdomen is flat.     Palpations: Abdomen is soft.  Musculoskeletal:        General: Normal range of motion.  Skin:    General: Skin is warm and dry.  Neurological:     General: No focal deficit present.     Mental Status: She is alert. Mental status is at baseline.  Psychiatric:        Attention and Perception: She is inattentive.        Mood and Affect: Affect is blunt.        Speech: She is noncommunicative.        Behavior: Behavior is withdrawn.  Cognition and Memory: Cognition is impaired.    Review of Systems  Unable to perform ROS: Psychiatric disorder   Blood pressure (!) 154/113, pulse (!) 126, temperature 99.2 F (37.3 C), temperature source Oral, resp. rate 17, height 5\' 5"  (1.651 m), weight 72.6 kg, SpO2  99 %, unknown if currently breastfeeding. Body mass index is 26.63 kg/m.   COGNITIVE FEATURES THAT CONTRIBUTE TO RISK:  Polarized thinking    SUICIDE RISK:   Minimal: No identifiable suicidal ideation.  Patients presenting with no risk factors but with morbid ruminations; may be classified as minimal risk based on the severity of the depressive symptoms  PLAN OF CARE: Continue 15-minute checks.  Restart psychiatric medicine.  Treatment team assessment.  Ongoing evaluation of mental state in individual and group therapy.  Ongoing evaluation of dangerousness prior to discharge  I certify that inpatient services furnished can reasonably be expected to improve the patient's condition.   Mordecai Rasmussen, MD 03/13/2022, 4:10 PM

## 2022-03-13 NOTE — ED Notes (Signed)
At this time, pts VS not within limits that are stable for BMU admission. Sidney Ace, MD aware and Amy, RN made aware. Pt to be medically cleared before ability to admit safely in BMU. BMU made aware of inability to send pt at this time.

## 2022-03-13 NOTE — Progress Notes (Signed)
At the nurses station requesting information on when she arrived to the hospital and when she arrived on the unit. Was given a journal for her to keep all of her information and thoughts in.  Reports feeling like she has been here longer than the 2 days that she has been her for.  She is pleasant and cooperative, but needy in the since that she is constantly knocking on the door asking for random items and information.  She is  med compliant and received her medication without incident.  Reports needing to be on 10mg  of zyprexa vs the 5mg  that she is currently prescribed.  Reports being off her meds for a while but recently starting back on them. Still some disorganized thoughts, but most of her speech is logical and coherent. Will continue to monitor with q 15 minute safety checks.  Encouraged her to seek staff with any concerns.     C Butler-Nicholson, LPN

## 2022-03-14 DIAGNOSIS — F250 Schizoaffective disorder, bipolar type: Secondary | ICD-10-CM | POA: Diagnosis not present

## 2022-03-14 LAB — LIPID PANEL
Cholesterol: 143 mg/dL (ref 0–200)
HDL: 61 mg/dL (ref >40)
LDL Cholesterol: 74 mg/dL (ref 0–99)
Total CHOL/HDL Ratio: 2.3 RATIO
Triglycerides: 40 mg/dL (ref <150)
VLDL: 8 mg/dL (ref 0–40)

## 2022-03-14 NOTE — Plan of Care (Addendum)
Pt rates depression, anxiety and hopelessness all at 3/10. Pt denies SI, HI and VH. Pt endorses AH. Pt was educated on care plan and verbalizes understanding. Torrie Mayers RN Problem: Education: Goal: Knowledge of General Education information will improve Description: Including pain rating scale, medication(s)/side effects and non-pharmacologic comfort measures Outcome: Progressing   Problem: Health Behavior/Discharge Planning: Goal: Ability to manage health-related needs will improve Outcome: Progressing   Problem: Clinical Measurements: Goal: Ability to maintain clinical measurements within normal limits will improve Outcome: Progressing Goal: Will remain free from infection Outcome: Progressing Goal: Diagnostic test results will improve Outcome: Progressing Goal: Respiratory complications will improve Outcome: Progressing Goal: Cardiovascular complication will be avoided Outcome: Progressing   Problem: Activity: Goal: Risk for activity intolerance will decrease Outcome: Progressing   Problem: Nutrition: Goal: Adequate nutrition will be maintained Outcome: Progressing   Problem: Coping: Goal: Level of anxiety will decrease Outcome: Progressing   Problem: Elimination: Goal: Will not experience complications related to bowel motility Outcome: Progressing Goal: Will not experience complications related to urinary retention Outcome: Progressing   Problem: Pain Managment: Goal: General experience of comfort will improve Outcome: Progressing   Problem: Safety: Goal: Ability to remain free from injury will improve Outcome: Progressing   Problem: Skin Integrity: Goal: Risk for impaired skin integrity will decrease Outcome: Progressing   Problem: Activity: Goal: Will verbalize the importance of balancing activity with adequate rest periods Outcome: Progressing   Problem: Education: Goal: Will be free of psychotic symptoms Outcome: Progressing Goal: Knowledge of  the prescribed therapeutic regimen will improve Outcome: Progressing   Problem: Coping: Goal: Coping ability will improve Outcome: Progressing Goal: Will verbalize feelings Outcome: Progressing   Problem: Health Behavior/Discharge Planning: Goal: Compliance with prescribed medication regimen will improve Outcome: Progressing   Problem: Nutritional: Goal: Ability to achieve adequate nutritional intake will improve Outcome: Progressing   Problem: Role Relationship: Goal: Ability to communicate needs accurately will improve Outcome: Progressing Goal: Ability to interact with others will improve Outcome: Progressing   Problem: Safety: Goal: Ability to redirect hostility and anger into socially appropriate behaviors will improve Outcome: Progressing Goal: Ability to remain free from injury will improve Outcome: Progressing   Problem: Self-Care: Goal: Ability to participate in self-care as condition permits will improve Outcome: Progressing   Problem: Self-Concept: Goal: Will verbalize positive feelings about self Outcome: Progressing   Problem: Education: Goal: Knowledge of [place] [facility] Education information/materials will improve Outcome: Progressing Goal: Emotional status will improve Outcome: Progressing Goal: Mental status will improve Outcome: Progressing Goal: Verbalization of understanding the information provided will improve Outcome: Progressing   Problem: Activity: Goal: Interest or engagement in activities will improve Outcome: Progressing Goal: Sleeping patterns will improve Outcome: Progressing   Problem: Coping: Goal: Ability to verbalize frustrations and anger appropriately will improve Outcome: Progressing Goal: Ability to demonstrate self-control will improve Outcome: Progressing   Problem: Health Behavior/Discharge Planning: Goal: Identification of resources available to assist in meeting health care needs will improve Outcome:  Progressing Goal: Compliance with treatment plan for underlying cause of condition will improve Outcome: Progressing   Problem: Physical Regulation: Goal: Ability to maintain clinical measurements within normal limits will improve Outcome: Progressing   Problem: Safety: Goal: Periods of time without injury will increase Outcome: Progressing

## 2022-03-14 NOTE — BH IP Treatment Plan (Signed)
Interdisciplinary Treatment and Diagnostic Plan Update  03/14/2022 Time of Session: 9:30AM Alyssa Mathis MRN: [id number]  Principal Diagnosis: Schizoaffective disorder [facility])  Secondary Diagnoses: Principal Problem:   Schizoaffective disorder ([place])   Current Medications:  Current Facility-Administered Medications  Medication Dose Route Frequency Provider Last Rate Last Admin   acetaminophen (TYLENOL) tablet 650 mg  650 mg Oral Q6H PRN Sherlon Handing, NP       alum & mag hydroxide-simeth (MAALOX/MYLANTA) 200-200-20 MG/5ML suspension 30 mL  30 mL Oral Q4H PRN Sherlon Handing, NP       cephALEXin (KEFLEX) capsule 500 mg  500 mg Oral Q8H Waldon Merl F, NP   500 mg at 03/14/22 1829   hydrOXYzine (ATARAX) tablet 25 mg  25 mg Oral TID PRN Sherlon Handing, NP       LORazepam (ATIVAN) tablet 2 mg  2 mg Oral Q6H PRN [name], Madie Reno, MD       Or   LORazepam (ATIVAN) injection 2 mg  2 mg Intramuscular Q6H PRN [name], Madie Reno, MD       magnesium hydroxide (MILK OF MAGNESIA) suspension 30 mL  30 mL Oral Daily PRN Sherlon Handing, NP       NIFEdipine (ADALAT CC) 24 hr tablet 60 mg  60 mg Oral Daily Waldon Merl F, NP   60 mg at 03/14/22 0733   OLANZapine zydis (ZYPREXA) disintegrating tablet 5 mg  5 mg Oral BID [name], Madie Reno, MD   5 mg at 03/14/22 9371   PTA Medications: Medications Prior to Admission  Medication Sig Dispense Refill Last Dose   acetaminophen (TYLENOL) 325 MG tablet Take 2 tablets (650 mg total) by mouth every 4 (four) hours as needed (for pain scale < 4).   prn   NIFEdipine (PROCARDIA XL/NIFEDICAL XL) 60 MG 24 hr tablet Take 1 tablet (60 mg total) by mouth daily. (Patient not taking: Reported on 03/13/2022) 30 tablet 5 Not Taking   OLANZapine (ZYPREXA) 20 MG tablet Take 20 mg by mouth at bedtime.      OLANZapine (ZYPREXA) 5 MG tablet Take 1 tablet (5 mg total) by mouth at bedtime. 30 tablet 5     Patient Stressors: Medication change or noncompliance     Patient Strengths: Motivation for treatment/growth   Treatment Modalities: Medication Management, Group therapy, Case management,  1 to 1 session with clinician, Psychoeducation, Recreational therapy.   Physician Treatment Plan for Primary Diagnosis: Schizoaffective disorder ([place]) Long Term Goal(s): Improvement in symptoms so as ready for discharge   Short Term Goals: Compliance with prescribed medications will improve Ability to verbalize feelings will improve Ability to demonstrate self-control will improve  Medication Management: Evaluate patient's response, side effects, and tolerance of medication regimen.  Therapeutic Interventions: 1 to 1 sessions, Unit Group sessions and Medication administration.  Evaluation of Outcomes: Not Progressing  Physician Treatment Plan for Secondary Diagnosis: Principal Problem:   Schizoaffective disorder ([place])  Long Term Goal(s): Improvement in symptoms so as ready for discharge   Short Term Goals: Compliance with prescribed medications will improve Ability to verbalize feelings will improve Ability to demonstrate self-control will improve     Medication Management: Evaluate patient's response, side effects, and tolerance of medication regimen.  Therapeutic Interventions: 1 to 1 sessions, Unit Group sessions and Medication administration.  Evaluation of Outcomes: Not Progressing   RN Treatment Plan for Primary Diagnosis: Schizoaffective disorder ([place]) Long Term Goal(s): Knowledge of disease and therapeutic regimen to maintain health will improve  Short  Term Goals: Ability to demonstrate self-control, Ability to participate in decision making will improve, Ability to verbalize feelings will improve, Ability to disclose and discuss suicidal ideas, Ability to identify and develop effective coping behaviors will improve, and Compliance with prescribed medications will improve  Medication Management: RN will administer medications as ordered  by provider, will assess and evaluate patient's response and provide education to patient for prescribed medication. RN will report any adverse and/or side effects to prescribing provider.  Therapeutic Interventions: 1 on 1 counseling sessions, Psychoeducation, Medication administration, Evaluate responses to treatment, Monitor vital signs and CBGs as ordered, Perform/monitor CIWA, COWS, AIMS and Fall Risk screenings as ordered, Perform wound care treatments as ordered.  Evaluation of Outcomes: Not Met   LCSW Treatment Plan for Primary Diagnosis: Schizoaffective disorder ([place]) Long Term Goal(s): Safe transition to appropriate next level of care at discharge, Engage patient in therapeutic group addressing interpersonal concerns.  Short Term Goals: Engage patient in aftercare planning with referrals and resources, Increase social support, Increase ability to appropriately verbalize feelings, Increase emotional regulation, Facilitate acceptance of mental health diagnosis and concerns, Facilitate patient progression through stages of change regarding substance use diagnoses and concerns, Identify triggers associated with mental health/substance abuse issues, and Increase skills for wellness and recovery  Therapeutic Interventions: Assess for all discharge needs, 1 to 1 time with Social worker, Explore available resources and support systems, Assess for adequacy in community support network, Educate family and significant other(s) on suicide prevention, Complete Psychosocial Assessment, Interpersonal group therapy.  Evaluation of Outcomes: Not Met   Progress in Treatment: Attending groups: No. Participating in groups: No. Taking medication as prescribed: Yes. Toleration medication: Yes. Family/Significant other contact made: No, will contact:  once permission is given. Patient understands diagnosis: Yes. Discussing patient identified problems/goals with staff: Yes. Medical problems stabilized or  resolved: Yes. Denies suicidal/homicidal ideation: Yes. Issues/concerns per patient self-inventory: No. Other: none  New problem(s) identified: No, Describe:  none  New Short Term/Long Term Goal(s):  detox, elimination of symptoms of psychosis, medication management for mood stabilization; elimination of SI thoughts; development of comprehensive mental wellness/sobriety plan.   Patient Goals:  "I would like to get well, get better"  Discharge Plan or Barriers: CSW to assist patient in developing appropriate discharge plans.   Reason for Continuation of Hospitalization: Anxiety Delusions  Depression Hallucinations Medication stabilization Suicidal ideation  Estimated Length of Stay:  1-7 days  Last 3 [place] Suicide Severity Risk Score: Flowsheet Row Admission (Current) from 03/13/2022 in [place] [facility] from 03/11/2022 in [place] [facility] from 07/20/2021 in [place] No Risk No Risk No Risk       Last PHQ 2/9 Scores:     No data to display          Scribe for Treatment Team: Rozann Lesches, LCSW 03/14/2022 10:30 AM

## 2022-03-14 NOTE — Progress Notes (Signed)
Pt has been calm, cooperative and med compliant. She received a PRN today for anxiety that brought relief. She moves very slowly and is disorganized. Her answers are delayed. She walks around and glares intensely. Torrie Mayers RN

## 2022-03-14 NOTE — Plan of Care (Signed)
  Problem: Education: Goal: Knowledge of General Education information will improve Description: Including pain rating scale, medication(s)/side effects and non-pharmacologic comfort measures Outcome: Progressing   Problem: Health Behavior/Discharge Planning: Goal: Ability to manage health-related needs will improve Outcome: Progressing   Problem: Clinical Measurements: Goal: Ability to maintain clinical measurements within normal limits will improve Outcome: Progressing Goal: Will remain free from infection Outcome: Progressing Goal: Diagnostic test results will improve Outcome: Progressing Goal: Respiratory complications will improve Outcome: Progressing Goal: Cardiovascular complication will be avoided Outcome: Progressing   Problem: Safety: Goal: Periods of time without injury will increase Outcome: Progressing   Problem: Physical Regulation: Goal: Ability to maintain clinical measurements within normal limits will improve Outcome: Progressing   Problem: Health Behavior/Discharge Planning: Goal: Identification of resources available to assist in meeting health care needs will improve Outcome: Progressing Goal: Compliance with treatment plan for underlying cause of condition will improve Outcome: Progressing   Problem: Coping: Goal: Ability to verbalize frustrations and anger appropriately will improve Outcome: Progressing Goal: Ability to demonstrate self-control will improve Outcome: Progressing   

## 2022-03-14 NOTE — Progress Notes (Signed)
[facility] MD Progress Note  03/14/2022 4:58 PM Alyssa Mathis  MRN:  [id number] Subjective: Follow-up patient with schizoaffective disorder.  She attended treatment team today.  Minimal communication.  Speaks in a whisper.  Very confused.  Wandering around the unit often looking confused.  Not violent or aggressive today. Principal Problem: Schizoaffective disorder (HCC) Diagnosis: Principal Problem:   Schizoaffective disorder (HCC)  Total Time spent with patient: 30 minutes  Past Psychiatric History: Past history of schizoaffective disorder with several episodes of worsening when she is off her medicine  Past Medical History:  Past Medical History:  Diagnosis Date   Anemia    Hypertension    Post partum depression    Pregnancy induced hypertension    Schizoaffective disorder (HCC)     Past Surgical History:  Procedure Laterality Date   NO PAST SURGERIES     Family History:  Family History  Problem Relation Age of Onset   Hypertension Sister    Hypertension Maternal Grandmother    Diabetes Maternal Grandfather    Hypertension Maternal Grandfather    Family Psychiatric  History: See previous Social History:  Social History   Substance and Sexual Activity  Alcohol Use Never     Social History   Substance and Sexual Activity  Drug Use Not Currently    Social History   Socioeconomic History   Marital status: Single    Spouse name: Not on file   Number of children: Not on file   Years of education: Not on file   Highest education level: Not on file  Occupational History   Not on file  Tobacco Use   Smoking status: Every Day    Types: Cigarettes   Smokeless tobacco: Never  Vaping Use   Vaping Use: Not on file  Substance and Sexual Activity   Alcohol use: Never   Drug use: Not Currently   Sexual activity: Yes  Other Topics Concern   Not on file  Social History Narrative   Not on file   Social Determinants of Health   Financial Resource Strain: Not on file   Food Insecurity: Not on file  Transportation Needs: Not on file  Physical Activity: Not on file  Stress: Not on file  Social Connections: Not on file   Additional Social History:                         Sleep: Fair  Appetite:  Fair  Current Medications: Current Facility-Administered Medications  Medication Dose Route Frequency Provider Last Rate Last Admin   acetaminophen (TYLENOL) tablet 650 mg  650 mg Oral Q6H PRN [name], NP       alum & mag hydroxide-simeth (MAALOX/MYLANTA) 200-200-20 MG/5ML suspension 30 mL  30 mL Oral Q4H PRN [name] F, NP       cephALEXin (KEFLEX) capsule 500 mg  500 mg Oral Q8H Barthold, Louise F, NP   500 mg at 03/14/22 1414   hydrOXYzine (ATARAX) tablet 25 mg  25 mg Oral TID PRN [name], NP       LORazepam (ATIVAN) tablet 2 mg  2 mg Oral Q6H PRN Maesyn Frisinger T, MD   2 mg at 03/14/22 1226   Or   LORazepam (ATIVAN) injection 2 mg  2 mg Intramuscular Q6H PRN Bahja Bence T, MD       magnesium hydroxide (MILK OF MAGNESIA) suspension 30 mL  30 mL Oral Daily PRN [name], NP  NIFEdipine (ADALAT CC) 24 hr tablet 60 mg  60 mg Oral Daily [name] F, NP   60 mg at 03/14/22 2778   OLANZapine zydis (ZYPREXA) disintegrating tablet 5 mg  5 mg Oral BID Fender Herder, [name], MD   5 mg at 03/14/22 1641    Lab Results:  Results for orders placed or performed during the hospital encounter of 03/13/22 (from the past 48 hour(s))  Lipid panel     Status: None   Collection Time: 03/14/22  6:45 AM  Result Value Ref Range   Cholesterol 143 0 - 200 mg/dL   Triglycerides 40 <242 mg/dL   HDL 61 >35 mg/dL   Total CHOL/HDL Ratio 2.3 RATIO   VLDL 8 0 - 40 mg/dL   LDL Cholesterol 74 0 - 99 mg/dL    Comment:        Total Cholesterol/HDL:CHD Risk Coronary Heart Disease Risk Table                     Men   Women  1/2 Average Risk   3.4   3.3  Average Risk       5.0   4.4  2 X Average Risk   9.6   7.1  3 X Average  Risk  23.4   11.0        Use the calculated Patient Ratio above and the CHD Risk Table to determine the patient's CHD Risk.        ATP III CLASSIFICATION (LDL):  <100     mg/dL   Optimal  361-443  mg/dL   Near or Above                    Optimal  130-159  mg/dL   Borderline  154-008  mg/dL   High  >676     mg/dL   Very High Performed at [facility], [address] [place], [place] [address]     Blood Alcohol level:  Lab Results  Component Value Date   [facility] <10 03/12/2022   ETH <10 01/13/2021    Metabolic Disorder Labs: Lab Results  Component Value Date   HGBA1C 4.8 06/27/2020   MPG 91 06/27/2020   No results found for: "PROLACTIN" Lab Results  Component Value Date   CHOL 143 03/14/2022   TRIG 40 03/14/2022   HDL 61 03/14/2022   CHOLHDL 2.3 03/14/2022   VLDL 8 03/14/2022   LDLCALC 74 03/14/2022   LDLCALC 82 06/27/2020    Physical Findings: AIMS:  , ,  ,  ,    CIWA:    COWS:     Musculoskeletal: Strength & Muscle Tone: within normal limits Gait & Station: normal Patient leans: N/A  Psychiatric Specialty Exam:  Presentation  General Appearance: Appropriate for Environment  Eye Contact:Good  Speech:Blocked (appears improved from earlier)  Speech Volume:Normal  Handedness:No data recorded  Mood and Affect  Mood:Dysphoric  Affect:Blunt   Thought Process  Thought Processes:Disorganized  Descriptions of Associations:No data recorded Orientation:Full (Time, Place and Person)  Thought Content:Scattered  History of Schizophrenia/Schizoaffective disorder:Yes  Duration of Psychotic Symptoms:Greater than six months  Hallucinations:No data recorded Ideas of Reference:Delusions  Suicidal Thoughts:No data recorded Homicidal Thoughts:No data recorded  Sensorium  Memory:Immediate Poor  Judgment:Poor  Insight:Fair   Executive Functions  Concentration:Fair  Attention Span:Fair  Recall:Fair  Fund of  Knowledge:Fair  Language:Fair   Psychomotor Activity  Psychomotor Activity:No data recorded  Assets  Assets:Resilience; Social Support; Housing;  Intimacy; Financial Resources/Insurance   Sleep  Sleep:No data recorded   Physical Exam: Physical Exam Vitals reviewed.  Constitutional:      Appearance: Normal appearance.  HENT:     Head: Normocephalic and atraumatic.     Mouth/Throat:     Pharynx: Oropharynx is clear.  Eyes:     Pupils: Pupils are equal, round, and reactive to light.  Cardiovascular:     Rate and Rhythm: Normal rate and regular rhythm.  Pulmonary:     Effort: Pulmonary effort is normal.     Breath sounds: Normal breath sounds.  Abdominal:     General: Abdomen is flat.     Palpations: Abdomen is soft.  Musculoskeletal:        General: Normal range of motion.  Skin:    General: Skin is warm and dry.  Neurological:     General: No focal deficit present.     Mental Status: She is alert. Mental status is at baseline.  Psychiatric:        Attention and Perception: She is inattentive.        Mood and Affect: Affect is blunt.        Speech: She is noncommunicative.    Review of Systems  Unable to perform ROS: Psychiatric disorder   Blood pressure (!) 138/98, pulse (!) 111, temperature 98.6 F (37 C), temperature source Oral, resp. rate 17, height 5\' 5"  (1.651 m), weight 72.6 kg, SpO2 98 %, unknown if currently breastfeeding. Body mass index is 26.63 kg/m.   Treatment Plan Summary: Medication management and Plan continuing current dose of olanzapine which had been appropriate in the past.  We will give that another day before trying to change anything.  Encourage patient to contact staff with any concerns  , MD 03/14/2022, 4:58 PM

## 2022-03-14 NOTE — Group Note (Signed)
[facility] LCSW Group Therapy Note   Group Date: 03/14/2022 Start Time: 1300 End Time: 1400   Type of Therapy/Topic:  Group Therapy:  Emotion Regulation  Participation Level:  Minimal   Mood:  Description of Group:    The purpose of this group is to assist patients in learning to regulate negative emotions and experience positive emotions. Patients will be guided to discuss ways in which they have been vulnerable to their negative emotions. These vulnerabilities will be juxtaposed with experiences of positive emotions or situations, and patients challenged to use positive emotions to combat negative ones. Special emphasis will be placed on coping with negative emotions in conflict situations, and patients will process healthy conflict resolution skills.  Therapeutic Goals: Patient will identify two positive emotions or experiences to reflect on in order to balance out negative emotions:  Patient will label two or more emotions that they find the most difficult to experience:  Patient will be able to demonstrate positive conflict resolution skills through discussion or role plays:   Summary of Patient Progress: Patient was present in group.  Patient appeared to be attentive though was limited in her interactions.  Patient reported that "intuition" is how she is aware of what emotions she is feeling.  She then began to make loose associations and became tangential, she began to discuss "someone that was HIV+ was watching my kids".  Patient was unable to tie the statement in to current group discussion and was redirected.   Therapeutic Modalities:   Cognitive Behavioral Therapy Feelings Identification Dialectical Behavioral Therapy   Harden Mo, LCSW

## 2022-03-14 NOTE — [facility] Counselor (Signed)
Patient disorganized and unable to participate in assessment.   Penni Homans, MSW, LCSW 03/14/2022 3:36 PM

## 2022-03-15 DIAGNOSIS — F250 Schizoaffective disorder, bipolar type: Secondary | ICD-10-CM | POA: Diagnosis not present

## 2022-03-15 LAB — HEMOGLOBIN A1C
Hgb A1c MFr Bld: 5 % (ref 4.8–5.6)
Mean Plasma Glucose: 97 mg/dL

## 2022-03-15 MED ORDER — OLANZAPINE 10 MG PO TBDP
10.0000 mg | ORAL_TABLET | Freq: Two times a day (BID) | ORAL | Status: DC
  Administered 2022-03-15 – 2022-03-20 (×11): 10 mg via ORAL
  Filled 2022-03-15 (×12): qty 1

## 2022-03-15 NOTE — [facility] Suicide Risk Assessment (Signed)
[facility] INPATIENT:  Family/Significant Other Suicide Prevention Education  Suicide Prevention Education:  Contact Attempts: [name], partner, [phone] has been identified by the patient as the family member/significant other with whom the patient will be residing, and identified as the person(s) who will aid the patient in the event of a mental health crisis.  With written consent from the patient, two attempts were made to provide suicide prevention education, prior to and/or following the patient's discharge.  We were unsuccessful in providing suicide prevention education.  A suicide education pamphlet was given to the patient to share with family/significant other.  Date and time of first attempt: 03/15/2022 at 3:57 PM Date and time of second attempt: Second attempt is needed.  HIPAA compliant voicemail is needed.   Harden Mo 03/15/2022, 3:56 PM

## 2022-03-15 NOTE — Group Note (Signed)
[facility] LCSW Group Therapy Note   Group Date: 03/15/2022 Start Time: 1300 End Time: 1400  Type of Therapy/Topic:  Group Therapy:  Feelings about Diagnosis  Participation Level:  Active    Description of Group:    This group will allow patients to explore their thoughts and feelings about diagnoses they have received. Patients will be guided to explore their level of understanding and acceptance of these diagnoses. Facilitator will encourage patients to process their thoughts and feelings about the reactions of others to their diagnosis, and will guide patients in identifying ways to discuss their diagnosis with significant others in their lives. This group will be process-oriented, with patients participating in exploration of their own experiences as well as giving and receiving support and challenge from other group members.   Therapeutic Goals: 1. Patient will demonstrate understanding of diagnosis as evidence by identifying two or more symptoms of the disorder:  2. Patient will be able to express two feelings regarding the diagnosis 3. Patient will demonstrate ability to communicate their needs through discussion and/or role plays  Summary of Patient Progress: Patient was present for the majority of group. 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 Pt spoke about her decision to stop taking her medications after four months even though she was feeling well. She asked for what olanzapine was used. CSW stated that this was a question for the provider. Pt began to become oppositional, stating "why are you talking about medication if that is not in your scope of practice." CSW reiterated that pt had brought up the discussion. Pt stated that she was leaving to go to the bathroom. She later returned and with every question from CSW she assumed another meaning. Although initially she seemed open and  receptive to feedback/comments from CSW, towards the end she was oppositional.    Therapeutic Modalities:   Cognitive Behavioral Therapy Brief Therapy Feelings Identification    [name], LCSW
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
## 2022-03-15 NOTE — Progress Notes (Signed)
Patient is pleasant an cooperative this evening.   She can at time be labile and present with religious ideations, mostly when she gets upset. She is med complaint but has some apprehension taking her antibiotic this evening. She is also upset that she does not  have her Zyprexa to take tonight. She reports that she is suppose to be on 10mg  of Zyprexa and she is suppose to be taking it at night.  Took her a while but with some coaxing she finally took her medication.  Was receptive to education related to high bp and pulse and the reasons why the numbers need to be regulated.  Will continue to monitor and encourage her to seek staff with any concerns.      C Butler-Nicholson, LPN

## 2022-03-15 NOTE — Progress Notes (Signed)
[facility] Alyssa Mathis Progress Note  03/15/2022 12:17 PM Alyssa Mathis  MRN:  [id number] Subjective: Follow-up note for this 36 year old woman with schizoaffective or bipolar disorder.  Patient has been labile and agitated almost continuously since coming here.  Early this morning she was up singing and dancing shouting very labile.  Continues all morning and after lunch.  She was able to come and sit down and have a conversation that made some sense and was able to tell me about the medicine she had been on in the past and that she had stopped it recently but then would revert into disorganized paranoid thinking that made little sense.  Has not been physically violent or threatening no report of any suicidal behavior. Principal Problem: Schizoaffective disorder (HCC) Diagnosis: Principal Problem:   Schizoaffective disorder (HCC)  Total Time spent with patient: 30 minutes  Past Psychiatric History: Patient has a history of chronic mental illness either schizoaffective or bipolar disorder.  She tells me that she had been completely off medicine at 1 point for many months at a time and had been asymptomatic which would be more typical of bipolar.  Past Medical History:  Past Medical History:  Diagnosis Date   Anemia    Hypertension    Post partum depression    Pregnancy induced hypertension    Schizoaffective disorder (HCC)     Past Surgical History:  Procedure Laterality Date   NO PAST SURGERIES     Family History:  Family History  Problem Relation Age of Onset   Hypertension Sister    Hypertension Maternal Grandmother    Diabetes Maternal Grandfather    Hypertension Maternal Grandfather    Family Psychiatric  History: See previous Social History:  Social History   Substance and Sexual Activity  Alcohol Use Never     Social History   Substance and Sexual Activity  Drug Use Not Currently    Social History   Socioeconomic History   Marital status: Single    Spouse name: Not on file    Number of children: Not on file   Years of education: Not on file   Highest education level: Not on file  Occupational History   Not on file  Tobacco Use   Smoking status: Every Day    Types: Cigarettes   Smokeless tobacco: Never  Vaping Use   Vaping Use: Not on file  Substance and Sexual Activity   Alcohol use: Never   Drug use: Not Currently   Sexual activity: Yes  Other Topics Concern   Not on file  Social History Narrative   Not on file   Social Determinants of Health   Financial Resource Strain: Not on file  Food Insecurity: Not on file  Transportation Needs: Not on file  Physical Activity: Not on file  Stress: Not on file  Social Connections: Not on file   Additional Social History:                         Sleep: Poor  Appetite:  Fair  Current Medications: Current Facility-Administered Medications  Medication Dose Route Frequency Provider Last Rate Last Admin   acetaminophen (TYLENOL) tablet 650 mg  650 mg Oral Q6H PRN Alyssa Mulders, Alyssa Mathis       alum & mag hydroxide-simeth (MAALOX/MYLANTA) 200-200-20 MG/5ML suspension 30 mL  30 mL Oral Q4H PRN Alyssa Cirri Mathis, Alyssa Mathis       cephALEXin (KEFLEX) capsule 500 mg  500 mg Oral Q8H Alyssa Mathis,  Alyssa Major, Alyssa Mathis   500 mg at 03/15/22 6295   hydrOXYzine (ATARAX) tablet 25 mg  25 mg Oral TID PRN Alyssa Mulders, Alyssa Mathis       LORazepam (ATIVAN) tablet 2 mg  2 mg Oral Q6H PRN Alyssa Mathis, Alyssa Denmark, Alyssa Mathis   2 mg at 03/14/22 2113   Or   LORazepam (ATIVAN) injection 2 mg  2 mg Intramuscular Q6H PRN Alyssa Mathis, Alyssa Denmark, Alyssa Mathis       magnesium hydroxide (MILK OF MAGNESIA) suspension 30 mL  30 mL Oral Daily PRN Alyssa Mulders, Alyssa Mathis   30 mL at 03/15/22 2841   NIFEdipine (ADALAT CC) 24 hr tablet 60 mg  60 mg Oral Daily Alyssa Cirri Mathis, Alyssa Mathis   60 mg at 03/15/22 3244   OLANZapine zydis (ZYPREXA) disintegrating tablet 10 mg  10 mg Oral BID Alyssa Mathis, Alyssa Denmark, Alyssa Mathis        Lab Results:  Results for orders placed or performed during the hospital  encounter of 03/13/22 (from the past 48 hour(s))  Hemoglobin A1c     Status: None   Collection Time: 03/14/22  6:45 AM  Result Value Ref Range   Hgb A1c MFr Bld 5.0 4.8 - 5.6 %    Comment: (NOTE)         Prediabetes: 5.7 - 6.4         Diabetes: >6.4         Glycemic control for adults with diabetes: <7.0    Mean Plasma Glucose 97 mg/dL    Comment: (NOTE) Performed At: [facility] [address] [place], [place] [id number] Alyssa Schimke Alyssa Mathis [id number]   Lipid panel     Status: None   Collection Time: 03/14/22  6:45 AM  Result Value Ref Range   Cholesterol 143 0 - 200 mg/dL   Triglycerides 40 <638 mg/dL   HDL 61 >75 mg/dL   Total CHOL/HDL Ratio 2.3 RATIO   VLDL 8 0 - 40 mg/dL   LDL Cholesterol 74 0 - 99 mg/dL    Comment:        Total Cholesterol/HDL:CHD Risk Coronary Heart Disease Risk Table                     Men   Women  1/2 Average Risk   3.4   3.3  Average Risk       5.0   4.4  2 X Average Risk   9.6   7.1  3 X Average Risk  23.4   11.0        Use the calculated Patient Ratio above and the CHD Risk Table to determine the patient's CHD Risk.        ATP III CLASSIFICATION (LDL):  <100     mg/dL   Optimal  643-329  mg/dL   Near or Above                    Optimal  130-159  mg/dL   Borderline  518-841  mg/dL   High  >660     mg/dL   Very High Performed at [facility], [address] [place], [place] [address]     Blood Alcohol level:  Lab Results  Component Value Date   [facility] <10 03/12/2022   ETH <10 01/13/2021    Metabolic Disorder Labs: Lab Results  Component Value Date   HGBA1C 5.0 03/14/2022   MPG 97 03/14/2022   MPG 91 06/27/2020   No  results found for: "PROLACTIN" Lab Results  Component Value Date   CHOL 143 03/14/2022   TRIG 40 03/14/2022   HDL 61 03/14/2022   CHOLHDL 2.3 03/14/2022   VLDL 8 03/14/2022   LDLCALC 74 03/14/2022   LDLCALC 82 06/27/2020    Physical Findings: AIMS:  , ,  ,  ,    CIWA:    COWS:      Musculoskeletal: Strength & Muscle Tone: within normal limits Gait & Station: normal Patient leans: N/A  Psychiatric Specialty Exam:  Presentation  General Appearance: Appropriate for Environment  Eye Contact:Good  Speech:Blocked (appears improved from earlier)  Speech Volume:Normal  Handedness:No data recorded  Mood and Affect  Mood:Dysphoric  Affect:Blunt   Thought Process  Thought Processes:Disorganized  Descriptions of Associations:No data recorded Orientation:Full (Time, Place and Person)  Thought Content:Scattered  History of Schizophrenia/Schizoaffective disorder:Yes  Duration of Psychotic Symptoms:Greater than six months  Hallucinations:No data recorded Ideas of Reference:Delusions  Suicidal Thoughts:No data recorded Homicidal Thoughts:No data recorded  Sensorium  Memory:Immediate Poor  Judgment:Poor  Insight:Fair   Executive Functions  Concentration:Fair  Attention Span:Fair  Recall:Fair  Fund of Knowledge:Fair  Language:Fair   Psychomotor Activity  Psychomotor Activity:No data recorded  Assets  Assets:Resilience; Social Support; Housing; Intimacy; Financial Resources/Insurance   Sleep  Sleep:No data recorded   Physical Exam: Physical Exam Vitals and nursing note reviewed.  Constitutional:      Appearance: Normal appearance.  HENT:     Head: Normocephalic and atraumatic.     Mouth/Throat:     Pharynx: Oropharynx is clear.  Eyes:     Pupils: Pupils are equal, round, and reactive to light.  Cardiovascular:     Rate and Rhythm: Normal rate and regular rhythm.  Pulmonary:     Effort: Pulmonary effort is normal.     Breath sounds: Normal breath sounds.  Abdominal:     General: Abdomen is flat.     Palpations: Abdomen is soft.  Musculoskeletal:        General: Normal range of motion.  Skin:    General: Skin is warm and dry.  Neurological:     General: No focal deficit present.     Mental Status: She is alert.  Mental status is at baseline.  Psychiatric:        Attention and Perception: She is inattentive.        Mood and Affect: Mood is elated. Affect is labile and inappropriate.        Speech: Speech is tangential.        Behavior: Behavior is agitated.        Thought Content: Thought content is paranoid.        Judgment: Judgment is inappropriate.    Review of Systems  Constitutional: Negative.   HENT: Negative.    Eyes: Negative.   Respiratory: Negative.    Cardiovascular: Negative.   Gastrointestinal: Negative.   Musculoskeletal: Negative.   Skin: Negative.   Neurological: Negative.   Psychiatric/Behavioral: Negative.     Blood pressure (!) 124/94, pulse (!) 112, temperature 98.3 Mathis (36.8 C), temperature source Oral, resp. rate 18, height 5\' 5"  (1.651 m), weight 72.6 kg, SpO2 100 %, unknown if currently breastfeeding. Body mass index is 26.63 kg/m.   Treatment Plan Summary: Medication management and Plan discussed plan with patient.  We are going to increase the olanzapine dose to 10 mg twice a day.  Patient understands and expresses agreement.  As needed's will still be in place.  Staff aware  that she is labile and will need close attention.  Ongoing assessment and treatment modification as needed.  Alyssa Rasmussen, Alyssa Mathis 03/15/2022, 12:17 PM

## 2022-03-15 NOTE — Plan of Care (Signed)
D: Pt alert and oriented. Pt rates depression 5/10, hopelessness 5/10, and anxiety 5/10. Pt goal: "my goals and follow the rules." Pt reports energy level as normal and concentration as being good. Pt reports sleep last night as being good. Pt did not receive medications for sleep. Pt denies experiencing any pain at this time. Pt denies experiencing any SI/HI, or AVH at this time.   Pt is labile in mood. Pt observed singing gospel songs loudly in the hallways. Pt observed calling staff members witches and demons at times. Pt has a fixed smile. Pt is watchful and has many questions about her medications.  A: Scheduled medications administered to pt, per MD orders. Support and encouragement provided. Frequent verbal contact made. Routine safety checks conducted q15 minutes.   R: No adverse drug reactions noted. Pt verbally contracts for safety at this time. Pt compliant with medications and treatment plan. Pt interacts poorly with others on the unit. Pt remains safe at this time. Will continue to monitor.   Problem: Education: Goal: Knowledge of General Education information will improve Description: Including pain rating scale, medication(s)/side effects and non-pharmacologic comfort measures Outcome: Progressing   Problem: Nutrition: Goal: Adequate nutrition will be maintained Outcome: Progressing

## 2022-03-15 NOTE — Progress Notes (Signed)
Recreation Therapy Notes  Date: 03/15/2022  Time: 10:45 am     Location: Court yard   Behavioral response: Redirection needed   Intervention Topic:  Social Skills   Discussion/Intervention:  Group content on today was focused on social skills. The group defined social skills and identified ways they use social skills. Patients expressed what obstacles they face when trying to be social. Participants described the importance of social skills. The group listed ways to improve social skills and reasons to improve social skills. Individuals had an opportunity to learn new and improve social skills as well as identify their weaknesses. Clinical Observations/Feedback: Patient came to group with disorganized behavior. Individual was social with peers and staff. When it was time to come inside patient refused expressing "you can go inside you do not even work here, you are trying to dim our light." Patient needed  redirection and eventually went inside.    Tanis Burnley LRT/CTRS         Mujahid Jalomo 03/15/2022 12:36 PM

## 2022-03-16 DIAGNOSIS — F250 Schizoaffective disorder, bipolar type: Secondary | ICD-10-CM | POA: Diagnosis not present

## 2022-03-16 NOTE — Plan of Care (Signed)
  Problem: Education: Goal: Knowledge of General Education information will improve Description: Including pain rating scale, medication(s)/side effects and non-pharmacologic comfort measures Outcome: Not Progressing   Problem: Health Behavior/Discharge Planning: Goal: Ability to manage health-related needs will improve Outcome: Not Progressing   Problem: Clinical Measurements: Goal: Ability to maintain clinical measurements within normal limits will improve Outcome: Not Progressing Goal: Will remain free from infection Outcome: Not Progressing Goal: Diagnostic test results will improve Outcome: Not Progressing Goal: Respiratory complications will improve Outcome: Not Progressing Goal: Cardiovascular complication will be avoided Outcome: Not Progressing   Problem: Safety: Goal: Periods of time without injury will increase Outcome: Not Progressing   Problem: Physical Regulation: Goal: Ability to maintain clinical measurements within normal limits will improve Outcome: Not Progressing   Problem: Health Behavior/Discharge Planning: Goal: Identification of resources available to assist in meeting health care needs will improve Outcome: Not Progressing Goal: Compliance with treatment plan for underlying cause of condition will improve Outcome: Not Progressing

## 2022-03-16 NOTE — Progress Notes (Signed)
Recreation Therapy Notes  Date: 03/16/2022  Time: 10:40 am     Location: Courtyard   Behavioral response: Appropriate  Intervention Topic:  Wellness    Discussion/Intervention:  Group content today was focused on Wellness. The group defined wellness and some positive ways they make decisions for themselves. Individuals expressed reasons why they neglected any wellness in the past. Patients described ways to improve wellness skills in the future. The group explained what could happen if they did not do any wellness at all. Participants express how bad choices has affected them and others around them. Individual explained the importance of wellness. The group participated in the intervention "Testing my Wellness" where they had a chance to identify some of their weaknesses and strengths in wellness.  Clinical Observations/Feedback: Patient came to group and was able to identify wellness activities they participate in outside the hospital.  Individual was social with peers and staff while participating in the intervention.     [name] LRT/CTRS         Alyssa Mathis 03/16/2022 12:14 PM
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
## 2022-03-16 NOTE — Group Note (Signed)
[facility] LCSW Group Therapy Note   Group Date: 03/16/2022 Start Time: 1300 End Time: 1400  Type of Therapy and Topic:  Group Therapy:  Feelings around Relapse and Recovery  Participation Level:  Did Not Attend    Description of Group:    Patients in this group will discuss emotions they experience before and after a relapse. They will process how experiencing these feelings, or avoidance of experiencing them, relates to having a relapse. Facilitator will guide patients to explore emotions they have related to recovery. Patients will be encouraged to process which emotions are more powerful. They will be guided to discuss the emotional reaction significant others in their lives may have to patients' relapse or recovery. Patients will be assisted in exploring ways to respond to the emotions of others without this contributing to a relapse.  Therapeutic Goals: Patient will identify two or more emotions that lead to relapse for them:  Patient will identify two emotions that result when they relapse:  Patient will identify two emotions related to recovery:  Patient will demonstrate ability to communicate their needs through discussion and/or role plays.   Summary of Patient Progress:  Group not held due to acuity on the unit.   Therapeutic Modalities:   Cognitive Behavioral Therapy Solution-Focused Therapy Assertiveness Training Relapse Prevention Therapy   Andriel Omalley R Abbrielle Batts, LCSW 

## 2022-03-16 NOTE — Progress Notes (Signed)
[facility] MD Progress Note  03/16/2022 3:29 PM Alyssa Mathis  MRN:  [id number] Subjective: Patient more sedated today.  Has not been singing hymns.  When she is up she still looks very confused and is not speaking much. Principal Problem: Schizoaffective disorder (HCC) Diagnosis: Principal Problem:   Schizoaffective disorder (HCC)  Total Time spent with patient: 30 minutes  Past Psychiatric History: Past history of bipolar psychosis or schizoaffective disorder  Past Medical History:  Past Medical History:  Diagnosis Date   Anemia    Hypertension    Post partum depression    Pregnancy induced hypertension    Schizoaffective disorder (HCC)     Past Surgical History:  Procedure Laterality Date   NO PAST SURGERIES     Family History:  Family History  Problem Relation Age of Onset   Hypertension Sister    Hypertension Maternal Grandmother    Diabetes Maternal Grandfather    Hypertension Maternal Grandfather    Family Psychiatric  History: See previous Social History:  Social History   Substance and Sexual Activity  Alcohol Use Never     Social History   Substance and Sexual Activity  Drug Use Not Currently    Social History   Socioeconomic History   Marital status: Single    Spouse name: Not on file   Number of children: Not on file   Years of education: Not on file   Highest education level: Not on file  Occupational History   Not on file  Tobacco Use   Smoking status: Every Day    Types: Cigarettes   Smokeless tobacco: Never  Vaping Use   Vaping Use: Not on file  Substance and Sexual Activity   Alcohol use: Never   Drug use: Not Currently   Sexual activity: Yes  Other Topics Concern   Not on file  Social History Narrative   Not on file   Social Determinants of Health   Financial Resource Strain: Not on file  Food Insecurity: Not on file  Transportation Needs: Not on file  Physical Activity: Not on file  Stress: Not on file  Social Connections: Not  on file   Additional Social History:                         Sleep: Fair  Appetite:  Fair  Current Medications: Current Facility-Administered Medications  Medication Dose Route Frequency Provider Last Rate Last Admin   acetaminophen (TYLENOL) tablet 650 mg  650 mg Oral Q6H PRN [name] F, NP   650 mg at 03/16/22 0840   alum & mag hydroxide-simeth (MAALOX/MYLANTA) 200-200-20 MG/5ML suspension 30 mL  30 mL Oral Q4H PRN [name] F, NP       cephALEXin (KEFLEX) capsule 500 mg  500 mg Oral Q8H Barthold, Louise F, NP   500 mg at 03/16/22 1417   hydrOXYzine (ATARAX) tablet 25 mg  25 mg Oral TID PRN [name], NP       LORazepam (ATIVAN) tablet 2 mg  2 mg Oral Q6H PRN Carlyon Nolasco T, MD   2 mg at 03/15/22 2335   Or   LORazepam (ATIVAN) injection 2 mg  2 mg Intramuscular Q6H PRN Chandler Stofer T, MD       magnesium hydroxide (MILK OF MAGNESIA) suspension 30 mL  30 mL Oral Daily PRN [name] F, NP   30 mL at 03/15/22 0811   NIFEdipine (ADALAT CC) 24 hr tablet 60 mg  60 mg Oral Daily Waldon Merl F, NP   60 mg at 03/16/22 0836   OLANZapine zydis (ZYPREXA) disintegrating tablet 10 mg  10 mg Oral BID Artez Regis, Madie Reno, MD   10 mg at 03/16/22 [id number]    Lab Results: No results found for this or any previous visit (from the past 48 hour(s)).  Blood Alcohol level:  Lab Results  Component Value Date   ETH <10 03/12/2022   ETH <10 [id number]    Metabolic Disorder Labs: Lab Results  Component Value Date   HGBA1C 5.0 03/14/2022   MPG 97 03/14/2022   MPG 91 06/27/2020   No results found for: "PROLACTIN" Lab Results  Component Value Date   CHOL 143 03/14/2022   TRIG 40 03/14/2022   HDL 61 03/14/2022   CHOLHDL 2.3 03/14/2022   VLDL 8 03/14/2022   LDLCALC 74 03/14/2022   LDLCALC 82 06/27/2020    Physical Findings: AIMS:  , ,  ,  ,    CIWA:    COWS:     Musculoskeletal: Strength & Muscle Tone: within normal limits Gait & Station:  normal Patient leans: N/A  Psychiatric Specialty Exam:  Presentation  General Appearance: Appropriate for Environment  Eye Contact:Good  Speech:Blocked (appears improved from earlier)  Speech Volume:Normal  Handedness:No data recorded  Mood and Affect  Mood:Dysphoric  Affect:Blunt   Thought Process  Thought Processes:Disorganized  Descriptions of Associations:No data recorded Orientation:Full (Time, Place and Person)  Thought Content:Scattered  History of Schizophrenia/Schizoaffective disorder:Yes  Duration of Psychotic Symptoms:Greater than six months  Hallucinations:No data recorded Ideas of Reference:Delusions  Suicidal Thoughts:No data recorded Homicidal Thoughts:No data recorded  Sensorium  Memory:Immediate Poor  Judgment:Poor  Insight:Fair   Executive Functions  Concentration:Fair  Attention Span:Fair  [place]   Psychomotor Activity  Psychomotor Activity:No data recorded  Assets  Assets:Resilience; Social Support; Housing; Intimacy; Financial Resources/Insurance   Sleep  Sleep:No data recorded   Physical Exam: Physical Exam Vitals and nursing note reviewed.  Constitutional:      Appearance: Normal appearance.  HENT:     Head: Normocephalic and atraumatic.     Mouth/Throat:     Pharynx: Oropharynx is clear.  Eyes:     Pupils: Pupils are equal, round, and reactive to light.  Cardiovascular:     Rate and Rhythm: Normal rate and regular rhythm.  Pulmonary:     Effort: Pulmonary effort is normal.     Breath sounds: Normal breath sounds.  Abdominal:     General: Abdomen is flat.     Palpations: Abdomen is soft.  Musculoskeletal:        General: Normal range of motion.  Skin:    General: Skin is warm and dry.  Neurological:     General: No focal deficit present.     Mental Status: She is alert. Mental status is at baseline.  Psychiatric:        Attention and Perception: She is  inattentive.        Mood and Affect: Affect is blunt.        Speech: She is noncommunicative.        Behavior: Behavior is withdrawn.        Cognition and Memory: Cognition is impaired.    Review of Systems  Unable to perform ROS: Psychiatric disorder  Constitutional: Negative.   HENT: Negative.    Eyes: Negative.   Respiratory: Negative.    Cardiovascular: Negative.   Gastrointestinal: Negative.   Musculoskeletal:  Negative.   Skin: Negative.   Neurological: Negative.    Blood pressure (!) 143/94, pulse (!) 116, temperature 98.1 F (36.7 C), temperature source Oral, resp. rate 18, height 5\' 5"  (1.651 m), weight 72.6 kg, SpO2 100 %, unknown if currently breastfeeding. Body mass index is 26.63 kg/m.   Treatment Plan Summary: Plan no change to current medication.  She is getting 10 mg of olanzapine twice a day.  A little sedated for now but if we do not start see improvement soon we will adjust as needed.  , MD 03/16/2022, 3:29 PM

## 2022-03-17 DIAGNOSIS — F250 Schizoaffective disorder, bipolar type: Secondary | ICD-10-CM | POA: Diagnosis not present

## 2022-03-17 MED ORDER — NIFEDIPINE ER OSMOTIC RELEASE 30 MG PO TB24
90.0000 mg | ORAL_TABLET | Freq: Every day | ORAL | Status: DC
  Administered 2022-03-18 – 2022-04-03 (×16): 90 mg via ORAL
  Filled 2022-03-17 (×17): qty 1

## 2022-03-17 NOTE — Group Note (Signed)
LCSW Group Therapy Note   Group Date: 03/17/2022 Start Time: 1230 End Time: 1330   Type of Therapy and Topic:  Group Therapy: Challenging Core Beliefs  Participation Level:  Did Not Attend  Description of Group:  Patients were educated about core beliefs and asked to identify one harmful core belief that they have. Patients were asked to explore from where those beliefs originate. Patients were asked to discuss how those beliefs make them feel and the resulting behaviors of those beliefs. They were then be asked if those beliefs are true and, if so, what evidence they have to support them. Lastly, group members were challenged to replace those negative core beliefs with helpful beliefs.   Therapeutic Goals:   1. Patient will identify harmful core beliefs and explore the origins of such beliefs. 2. Patient will identify feelings and behaviors that result from those core beliefs. 3. Patient will discuss whether such beliefs are true. 4.  Patient will replace harmful core beliefs with helpful ones.  Summary of Patient Progress:  Pt came to group initially however, did not stay and did not participate.   Therapeutic Modalities: Cognitive Behavioral Therapy; Solution-Focused Therapy   Otelia Santee, LCSW 03/17/2022  1:17 PM

## 2022-03-17 NOTE — Progress Notes (Signed)
[facility] MD Progress Note  03/17/2022 10:47 AM Alyssa Mathis  MRN:  [id number] Subjective: Follow-up for this woman with schizoaffective disorder.  She still wanders around the ward a fair bed sometimes looking confused or in a daze but when I sat down to talk with her today she was clearly improved compared to yesterday.  He was actually able to have a back-and-forth conversation with good meaning.  She asked appropriate questions.  She seems to be tolerating and taking the olanzapine which is starting to work. Principal Problem: Schizoaffective disorder (HCC) Diagnosis: Principal Problem:   Schizoaffective disorder (HCC)  Total Time spent with patient: 30 minutes  Past Psychiatric History: Past history of schizoaffective disorder.  Has done very well for long stretches on medication.  Past Medical History:  Past Medical History:  Diagnosis Date   Anemia    Hypertension    Post partum depression    Pregnancy induced hypertension    Schizoaffective disorder (HCC)     Past Surgical History:  Procedure Laterality Date   NO PAST SURGERIES     Family History:  Family History  Problem Relation Age of Onset   Hypertension Sister    Hypertension Maternal Grandmother    Diabetes Maternal Grandfather    Hypertension Maternal Grandfather    Family Psychiatric  History: See previous Social History:  Social History   Substance and Sexual Activity  Alcohol Use Never     Social History   Substance and Sexual Activity  Drug Use Not Currently    Social History   Socioeconomic History   Marital status: Single    Spouse name: Not on file   Number of children: Not on file   Years of education: Not on file   Highest education level: Not on file  Occupational History   Not on file  Tobacco Use   Smoking status: Every Day    Types: Cigarettes   Smokeless tobacco: Never  Vaping Use   Vaping Use: Not on file  Substance and Sexual Activity   Alcohol use: Never   Drug use: Not  Currently   Sexual activity: Yes  Other Topics Concern   Not on file  Social History Narrative   Not on file   Social Determinants of Health   Financial Resource Strain: Not on file  Food Insecurity: Not on file  Transportation Needs: Not on file  Physical Activity: Not on file  Stress: Not on file  Social Connections: Not on file   Additional Social History:                         Sleep: Fair  Appetite:  Fair  Current Medications: Current Facility-Administered Medications  Medication Dose Route Frequency Provider Last Rate Last Admin   acetaminophen (TYLENOL) tablet 650 mg  650 mg Oral Q6H PRN [name] F, NP   650 mg at 03/16/22 0840   alum & mag hydroxide-simeth (MAALOX/MYLANTA) 200-200-20 MG/5ML suspension 30 mL  30 mL Oral Q4H PRN [name] F, NP       cephALEXin (KEFLEX) capsule 500 mg  500 mg Oral Q8H Barthold, Louise F, NP   500 mg at 03/16/22 2119   hydrOXYzine (ATARAX) tablet 25 mg  25 mg Oral TID PRN [name], NP       LORazepam (ATIVAN) tablet 2 mg  2 mg Oral Q6H PRN Terance Pomplun, [name], MD   2 mg at 03/16/22 2118   Or   LORazepam (  ATIVAN) injection 2 mg  2 mg Intramuscular Q6H PRN Nobie Alleyne T, MD       magnesium hydroxide (MILK OF MAGNESIA) suspension 30 mL  30 mL Oral Daily PRN [name] F, NP   30 mL at 03/15/22 0811   NIFEdipine (ADALAT CC) 24 hr tablet 60 mg  60 mg Oral Daily [name] F, NP   60 mg at 03/17/22 0902   OLANZapine zydis (ZYPREXA) disintegrating tablet 10 mg  10 mg Oral BID Gale Hulse, [name], MD   10 mg at 03/17/22 2536    Lab Results: No results found for this or any previous visit (from the past 48 hour(s)).  Blood Alcohol level:  Lab Results  Component Value Date   ETH <10 03/12/2022   ETH <10 01/13/2021    Metabolic Disorder Labs: Lab Results  Component Value Date   HGBA1C 5.0 03/14/2022   MPG 97 03/14/2022   MPG 91 06/27/2020   No results found for: "PROLACTIN" Lab Results   Component Value Date   CHOL 143 03/14/2022   TRIG 40 03/14/2022   HDL 61 03/14/2022   CHOLHDL 2.3 03/14/2022   VLDL 8 03/14/2022   LDLCALC 74 03/14/2022   LDLCALC 82 06/27/2020    Physical Findings: AIMS:  , ,  ,  ,    CIWA:    COWS:     Musculoskeletal: Strength & Muscle Tone: within normal limits Gait & Station: normal Patient leans: N/A  Psychiatric Specialty Exam:  Presentation  General Appearance: Appropriate for Environment  Eye Contact:Good  Speech:Blocked (appears improved from earlier)  Speech Volume:Normal  Handedness:No data recorded  Mood and Affect  Mood:Dysphoric  Affect:Blunt   Thought Process  Thought Processes:Disorganized  Descriptions of Associations:No data recorded Orientation:Full (Time, Place and Person)  Thought Content:Scattered  History of Schizophrenia/Schizoaffective disorder:Yes  Duration of Psychotic Symptoms:Greater than six months  Hallucinations:No data recorded Ideas of Reference:Delusions  Suicidal Thoughts:No data recorded Homicidal Thoughts:No data recorded  Sensorium  Memory:Immediate Poor  Judgment:Poor  Insight:Fair   Executive Functions  Concentration:Fair  Attention Span:Fair  Recall:Fair  Fund of Knowledge:Fair  Language:Fair   Psychomotor Activity  Psychomotor Activity:No data recorded  Assets  Assets:Resilience; Social Support; Housing; Intimacy; Financial Resources/Insurance   Sleep  Sleep:No data recorded   Physical Exam: Physical Exam Vitals and nursing note reviewed.  Constitutional:      Appearance: Normal appearance.  HENT:     Head: Normocephalic and atraumatic.     Mouth/Throat:     Pharynx: Oropharynx is clear.  Eyes:     Pupils: Pupils are equal, round, and reactive to light.  Cardiovascular:     Rate and Rhythm: Normal rate and regular rhythm.  Pulmonary:     Effort: Pulmonary effort is normal.     Breath sounds: Normal breath sounds.  Abdominal:      General: Abdomen is flat.     Palpations: Abdomen is soft.  Musculoskeletal:        General: Normal range of motion.  Skin:    General: Skin is warm and dry.  Neurological:     General: No focal deficit present.     Mental Status: She is alert. Mental status is at baseline.  Psychiatric:        Attention and Perception: She is inattentive.        Mood and Affect: Affect is blunt.        Speech: Speech is delayed.        Behavior: Behavior  is slowed.        Cognition and Memory: Cognition is impaired. Memory is impaired.    Review of Systems  Constitutional: Negative.   HENT: Negative.    Eyes: Negative.   Respiratory: Negative.    Cardiovascular: Negative.   Gastrointestinal: Negative.   Musculoskeletal: Negative.   Skin: Negative.   Neurological: Negative.   Psychiatric/Behavioral: Negative.     Blood pressure (!) 149/106, pulse 73, temperature 98.7 F (37.1 C), temperature source Oral, resp. rate 18, height 5\' 5"  (1.651 m), weight 72.6 kg, SpO2 100 %, unknown if currently breastfeeding. Body mass index is 26.63 kg/m.   Treatment Plan Summary: Medication management and Plan no change today to medication management.  I do note that her blood pressure is high this morning.  I will have to take a look at that and see if that is been a consistent thing and might require some adjustment.  Meanwhile psychoeducation and encouragement as I think she is definitely showing signs of improvement  , MD 03/17/2022, 10:47 AM

## 2022-03-17 NOTE — Plan of Care (Signed)
Met with pt in medication room. Pt is worried but cooperative with care. Pt is adherent with scheduled medications. Pt denies SI / HI / AVH. Staff will continue to monitor for safety.   Problem: Education: Goal: Knowledge of General Education information will improve Description: Including pain rating scale, medication(s)/side effects and non-pharmacologic comfort measures Outcome: Not Progressing   Problem: Health Behavior/Discharge Planning: Goal: Ability to manage health-related needs will improve Outcome: Not Progressing   Problem: Clinical Measurements: Goal: Ability to maintain clinical measurements within normal limits will improve Outcome: Not Progressing Goal: Will remain free from infection Outcome: Not Progressing

## 2022-03-17 NOTE — Progress Notes (Signed)
Patient appears responding to internal stimuli , staring at staff inappropriately, she is intrusive at the nursing station with multiple request. Patient was frequently redirected, no distress  15 minutes safety checks.

## 2022-03-18 DIAGNOSIS — F250 Schizoaffective disorder, bipolar type: Secondary | ICD-10-CM | POA: Diagnosis not present

## 2022-03-18 NOTE — Progress Notes (Signed)
D: Patient alert and oriented. Patient denies pain. Patient denies anxiety and depression. Patient denies SI/HI/AVH. Patient did want writer to write down the timing of her medication so that she would know what she is taking and at what time.  Patient did not want 1700 medication stating she's not ready for her medication. Patient later came back to the nurses station stating that she is now ready for her medication.   A: Scheduled medications administered to patient, per MD orders, except for 1700 medication.  Support and encouragement provided to patient.  Q15 minute safety checks maintained.   R: Patient compliant with medication administration and treatment plan. No adverse drug reactions noted. Patient remains safe on the unit at this time.

## 2022-03-18 NOTE — Progress Notes (Signed)
[facility] MD Progress Note  03/18/2022 12:37 PM Alyssa Mathis  MRN:  [id number] Subjective: Follow-up patient with bipolar versus schizoaffective.  Withdrawn and quiet today but taking care of her ADLs eating normally taking care of her hygiene.  No bizarre behavior today. Principal Problem: Schizoaffective disorder (HCC) Diagnosis: Principal Problem:   Schizoaffective disorder (HCC)  Total Time spent with patient: 30 minutes  Past Psychiatric History: Multiple recurrent episodes of psychotic symptoms with mood symptoms often  Past Medical History:  Past Medical History:  Diagnosis Date   Anemia    Hypertension    Post partum depression    Pregnancy induced hypertension    Schizoaffective disorder (HCC)     Past Surgical History:  Procedure Laterality Date   NO PAST SURGERIES     Family History:  Family History  Problem Relation Age of Onset   Hypertension Sister    Hypertension Maternal Grandmother    Diabetes Maternal Grandfather    Hypertension Maternal Grandfather    Family Psychiatric  History: See previous Social History:  Social History   Substance and Sexual Activity  Alcohol Use Never     Social History   Substance and Sexual Activity  Drug Use Not Currently    Social History   Socioeconomic History   Marital status: Single    Spouse name: Not on file   Number of children: Not on file   Years of education: Not on file   Highest education level: Not on file  Occupational History   Not on file  Tobacco Use   Smoking status: Every Day    Types: Cigarettes   Smokeless tobacco: Never  Vaping Use   Vaping Use: Not on file  Substance and Sexual Activity   Alcohol use: Never   Drug use: Not Currently   Sexual activity: Yes  Other Topics Concern   Not on file  Social History Narrative   Not on file   Social Determinants of Health   Financial Resource Strain: Not on file  Food Insecurity: Not on file  Transportation Needs: Not on file  Physical  Activity: Not on file  Stress: Not on file  Social Connections: Not on file   Additional Social History:                         Sleep: Fair  Appetite:  Fair  Current Medications: Current Facility-Administered Medications  Medication Dose Route Frequency Provider Last Rate Last Admin   acetaminophen (TYLENOL) tablet 650 mg  650 mg Oral Q6H PRN [name] F, NP   650 mg at 03/16/22 0840   alum & mag hydroxide-simeth (MAALOX/MYLANTA) 200-200-20 MG/5ML suspension 30 mL  30 mL Oral Q4H PRN [name] F, NP       cephALEXin (KEFLEX) capsule 500 mg  500 mg Oral Q8H Barthold, Louise F, NP   500 mg at 03/17/22 2136   hydrOXYzine (ATARAX) tablet 25 mg  25 mg Oral TID PRN [name], NP       LORazepam (ATIVAN) tablet 2 mg  2 mg Oral Q6H PRN Markea Ruzich T, MD   2 mg at 03/17/22 2136   Or   LORazepam (ATIVAN) injection 2 mg  2 mg Intramuscular Q6H PRN Kristle Wesch T, MD       magnesium hydroxide (MILK OF MAGNESIA) suspension 30 mL  30 mL Oral Daily PRN [name] F, NP   30 mL at 03/15/22 0811   NIFEdipine (PROCARDIA-XL/NIFEDICAL-XL) 24  hr tablet 90 mg  90 mg Oral Daily Zendayah Hardgrave T, MD   90 mg at 03/18/22 0850   OLANZapine zydis (ZYPREXA) disintegrating tablet 10 mg  10 mg Oral BID Zurisadai Helminiak, [name], MD   10 mg at 03/18/22 0849    Lab Results: No results found for this or any previous visit (from the past 48 hour(s)).  Blood Alcohol level:  Lab Results  Component Value Date   ETH <10 03/12/2022   ETH <10 01/13/2021    Metabolic Disorder Labs: Lab Results  Component Value Date   HGBA1C 5.0 03/14/2022   MPG 97 03/14/2022   MPG 91 06/27/2020   No results found for: "PROLACTIN" Lab Results  Component Value Date   CHOL 143 03/14/2022   TRIG 40 03/14/2022   HDL 61 03/14/2022   CHOLHDL 2.3 03/14/2022   VLDL 8 03/14/2022   LDLCALC 74 03/14/2022   LDLCALC 82 06/27/2020    Physical Findings: AIMS:  , ,  ,  ,    CIWA:    COWS:      Musculoskeletal: Strength & Muscle Tone: within normal limits Gait & Station: normal Patient leans: N/A  Psychiatric Specialty Exam:  Presentation  General Appearance: Appropriate for Environment  Eye Contact:Good  Speech:Blocked (appears improved from earlier)  Speech Volume:Normal  Handedness:No data recorded  Mood and Affect  Mood:Dysphoric  Affect:Blunt   Thought Process  Thought Processes:Disorganized  Descriptions of Associations:No data recorded Orientation:Full (Time, Place and Person)  Thought Content:Scattered  History of Schizophrenia/Schizoaffective disorder:Yes  Duration of Psychotic Symptoms:Greater than six months  Hallucinations:No data recorded Ideas of Reference:Delusions  Suicidal Thoughts:No data recorded Homicidal Thoughts:No data recorded  Sensorium  Memory:Immediate Poor  Judgment:Poor  Insight:Fair   Executive Functions  Concentration:Fair  Attention Span:Fair  Recall:Fair  Fund of Knowledge:Fair  Language:Fair   Psychomotor Activity  Psychomotor Activity:No data recorded  Assets  Assets:Resilience; Social Support; Housing; Intimacy; Financial Resources/Insurance   Sleep  Sleep:No data recorded   Physical Exam: Physical Exam Vitals and nursing note reviewed.  Constitutional:      Appearance: Normal appearance.  HENT:     Head: Normocephalic and atraumatic.     Mouth/Throat:     Pharynx: Oropharynx is clear.  Eyes:     Pupils: Pupils are equal, round, and reactive to light.  Cardiovascular:     Rate and Rhythm: Normal rate and regular rhythm.  Pulmonary:     Effort: Pulmonary effort is normal.     Breath sounds: Normal breath sounds.  Abdominal:     General: Abdomen is flat.     Palpations: Abdomen is soft.  Musculoskeletal:        General: Normal range of motion.  Skin:    General: Skin is warm and dry.  Neurological:     General: No focal deficit present.     Mental Status: She is alert.  Mental status is at baseline.  Psychiatric:        Attention and Perception: She is inattentive.        Mood and Affect: Mood normal. Affect is blunt.        Speech: Speech is delayed.        Behavior: Behavior is slowed.        Thought Content: Thought content normal.    Review of Systems  Constitutional: Negative.   HENT: Negative.    Eyes: Negative.   Respiratory: Negative.    Cardiovascular: Negative.   Gastrointestinal: Negative.   Musculoskeletal: Negative.  Skin: Negative.   Neurological: Negative.   Psychiatric/Behavioral: Negative.     Blood pressure (!) 127/98, pulse (!) 112, temperature 98.8 F (37.1 C), temperature source Oral, resp. rate 18, height 5\' 5"  (1.651 m), weight 72.6 kg, SpO2 100 %, unknown if currently breastfeeding. Body mass index is 26.63 kg/m.   Treatment Plan Summary: Plan follow-up with continued medication management with antipsychotics.  No change to other medicine.  , MD 03/18/2022, 12:37 PM

## 2022-03-18 NOTE — Plan of Care (Signed)
  Problem: Education: Goal: Knowledge of the prescribed therapeutic regimen will improve Outcome: Progressing   Problem: Health Behavior/Discharge Planning: Goal: Compliance with prescribed medication regimen will improve Outcome: Progressing   Problem: Safety: Goal: Ability to remain free from injury will improve Outcome: Progressing   Problem: Education: Goal: Knowledge of [place] [facility] Education information/materials will improve Outcome: Progressing Goal: Verbalization of understanding the information provided will improve Outcome: Progressing

## 2022-03-18 NOTE — Progress Notes (Signed)
Patient is responding to internal stimuli, she is bizarre and intrusive, she was verbally aggressive to staff and yelling using profanities. Patient unprovoked gets irritable and angry towards staff. 15 minutes safety check maintained.

## 2022-03-18 NOTE — Group Note (Signed)
LCSW Group Therapy Note  Group Date: 03/18/2022 Start Time: 1300 End Time: 1400   Type of Therapy and Topic:  Group Therapy - Healthy vs Unhealthy Coping Skills  Participation Level:  Active   Description of Group The focus of this group was to determine what unhealthy coping techniques typically are used by group members and what healthy coping techniques would be helpful in coping with various problems. Patients were guided in becoming aware of the differences between healthy and unhealthy coping techniques. Patients were asked to identify 2-3 healthy coping skills they would like to learn to use more effectively.  Therapeutic Goals Patients learned that coping is what human beings do all day long to deal with various situations in their lives Patients defined and discussed healthy vs unhealthy coping techniques Patients identified their preferred coping techniques and identified whether these were healthy or unhealthy Patients determined 2-3 healthy coping skills they would like to become more familiar with and use more often. Patients provided support and ideas to each other   Summary of Patient Progress:   Patient was present for the entirety of the group session. Patient was an active listener and participated in the topic of discussion, provided helpful advice to others, and added nuance to topic of conversation. Patient shared her morning routine which is meant to start her day off on a positive way. Though also describes her routines can be negatively effecting her when her OCD starts to take over.    Therapeutic Modalities Cognitive Behavioral Therapy Motivational Interviewing  Corky Crafts, Theresia Majors 03/18/2022  2:15 PM

## 2022-03-19 DIAGNOSIS — F250 Schizoaffective disorder, bipolar type: Secondary | ICD-10-CM | POA: Diagnosis not present

## 2022-03-19 MED ORDER — CLONIDINE HCL 0.1 MG PO TABS
0.1000 mg | ORAL_TABLET | Freq: Once | ORAL | Status: AC
Start: 2022-03-19 — End: 2022-03-19
  Administered 2022-03-19: 0.1 mg via ORAL
  Filled 2022-03-19: qty 1

## 2022-03-19 NOTE — BH IP Treatment Plan (Signed)
Interdisciplinary Treatment and Diagnostic Plan Update  03/19/2022 Time of Session: 8:30 AM Noha Milberger MRN: [id number]  Principal Diagnosis: Schizoaffective disorder, bipolar type (HCC)  Secondary Diagnoses: Principal Problem:   Schizoaffective disorder, bipolar type (HCC)   Current Medications:  Current Facility-Administered Medications  Medication Dose Route Frequency Provider Last Rate Last Admin   acetaminophen (TYLENOL) tablet 650 mg  650 mg Oral Q6H PRN [name], NP   650 mg at 03/16/22 0840   alum & mag hydroxide-simeth (MAALOX/MYLANTA) 200-200-20 MG/5ML suspension 30 mL  30 mL Oral Q4H PRN [name], NP       cephALEXin (KEFLEX) capsule 500 mg  500 mg Oral Q8H Barthold, Sallye Ober F, NP   500 mg at 03/18/22 1357   hydrOXYzine (ATARAX) tablet 25 mg  25 mg Oral TID PRN [name], NP       LORazepam (ATIVAN) tablet 2 mg  2 mg Oral Q6H PRN [name], [name], MD   2 mg at 03/17/22 2136   Or   LORazepam (ATIVAN) injection 2 mg  2 mg Intramuscular Q6H PRN [name], [name], MD       magnesium hydroxide (MILK OF MAGNESIA) suspension 30 mL  30 mL Oral Daily PRN [name], NP   30 mL at 03/15/22 0811   NIFEdipine (PROCARDIA-XL/NIFEDICAL-XL) 24 hr tablet 90 mg  90 mg Oral Daily [name], [name], MD   90 mg at 03/19/22 0902   OLANZapine zydis (ZYPREXA) disintegrating tablet 10 mg  10 mg Oral BID [name], [name], MD   10 mg at 03/19/22 0902   PTA Medications: Medications Prior to Admission  Medication Sig Dispense Refill Last Dose   acetaminophen (TYLENOL) 325 MG tablet Take 2 tablets (650 mg total) by mouth every 4 (four) hours as needed (for pain scale < 4).   prn   NIFEdipine (PROCARDIA XL/NIFEDICAL XL) 60 MG 24 hr tablet Take 1 tablet (60 mg total) by mouth daily. (Patient not taking: Reported on 03/13/2022) 30 tablet 5 Not Taking   OLANZapine (ZYPREXA) 20 MG tablet Take 20 mg by mouth at bedtime.      OLANZapine (ZYPREXA) 5 MG tablet Take 1 tablet (5 mg  total) by mouth at bedtime. 30 tablet 5     Patient Stressors: Medication change or noncompliance    Patient Strengths: Motivation for treatment/growth   Treatment Modalities: Medication Management, Group therapy, Case management,  1 to 1 session with clinician, Psychoeducation, Recreational therapy.   Physician Treatment Plan for Primary Diagnosis: Schizoaffective disorder, bipolar type (HCC) Long Term Goal(s): Improvement in symptoms so as ready for discharge   Short Term Goals: Compliance with prescribed medications will improve Ability to verbalize feelings will improve Ability to demonstrate self-control will improve  Medication Management: Evaluate patient's response, side effects, and tolerance of medication regimen.  Therapeutic Interventions: 1 to 1 sessions, Unit Group sessions and Medication administration.  Evaluation of Outcomes: Progressing  Physician Treatment Plan for Secondary Diagnosis: Principal Problem:   Schizoaffective disorder, bipolar type (HCC)  Long Term Goal(s): Improvement in symptoms so as ready for discharge   Short Term Goals: Compliance with prescribed medications will improve Ability to verbalize feelings will improve Ability to demonstrate self-control will improve     Medication Management: Evaluate patient's response, side effects, and tolerance of medication regimen.  Therapeutic Interventions: 1 to 1 sessions, Unit Group sessions and Medication administration.  Evaluation of Outcomes: Progressing   RN Treatment Plan for Primary Diagnosis: Schizoaffective disorder, bipolar type (  HCC) Long Term Goal(s): Knowledge of disease and therapeutic regimen to maintain health will improve  Short Term Goals: Ability to remain free from injury will improve, Ability to verbalize frustration and anger appropriately will improve, Ability to demonstrate self-control, Ability to participate in decision making will improve, Ability to verbalize feelings  will improve, Ability to disclose and discuss suicidal ideas, Ability to identify and develop effective coping behaviors will improve, and Compliance with prescribed medications will improve  Medication Management: RN will administer medications as ordered by provider, will assess and evaluate patient's response and provide education to patient for prescribed medication. RN will report any adverse and/or side effects to prescribing provider.  Therapeutic Interventions: 1 on 1 counseling sessions, Psychoeducation, Medication administration, Evaluate responses to treatment, Monitor vital signs and CBGs as ordered, Perform/monitor CIWA, COWS, AIMS and Fall Risk screenings as ordered, Perform wound care treatments as ordered.  Evaluation of Outcomes: Progressing   LCSW Treatment Plan for Primary Diagnosis: Schizoaffective disorder, bipolar type (HCC) Long Term Goal(s): Safe transition to appropriate next level of care at discharge, Engage patient in therapeutic group addressing interpersonal concerns.  Short Term Goals: Engage patient in aftercare planning with referrals and resources, Increase social support, Increase ability to appropriately verbalize feelings, Increase emotional regulation, Facilitate acceptance of mental health diagnosis and concerns, Facilitate patient progression through stages of change regarding substance use diagnoses and concerns, Identify triggers associated with mental health/substance abuse issues, and Increase skills for wellness and recovery  Therapeutic Interventions: Assess for all discharge needs, 1 to 1 time with Social worker, Explore available resources and support systems, Assess for adequacy in community support network, Educate family and significant other(s) on suicide prevention, Complete Psychosocial Assessment, Interpersonal group therapy.  Evaluation of Outcomes: Progressing   Progress in Treatment: Attending groups: Yes. Participating in groups:  Yes. Taking medication as prescribed: Yes. Toleration medication: Yes. Family/Significant other contact made: No, will contact:  partner, [name]. Patient understands diagnosis: No. Discussing patient identified problems/goals with staff: Yes. Medical problems stabilized or resolved: Yes. Denies suicidal/homicidal ideation: Yes. Issues/concerns per patient self-inventory: No. Other: none.  New problem(s) identified: No, Describe:  none identified.  Update 03/19/22: No changes at this time.  New Short Term/Long Term Goal(s):  detox, elimination of symptoms of psychosis, medication management for mood stabilization; elimination of SI thoughts; development of comprehensive mental wellness/sobriety plan. Update 03/19/22: No changes at this time.   Patient Goals:  "I would like to get well, get better" Update 03/19/22: No changes at this time.   Discharge Plan or Barriers: CSW to assist patient in developing appropriate discharge plans. Update 03/19/22: No changes at this time.   Reason for Continuation of Hospitalization: Anxiety Delusions  Depression Hallucinations Medication stabilization Suicidal ideation Update 03/19/22: No changes at this time.   Estimated Length of Stay:  1-7 days Update 03/19/22: No changes at this time.  Last 3 [place] Suicide Severity Risk Score: Flowsheet Row Admission (Current) from 03/13/2022 in [facility]PATIENT [facility] MEDICINE ED from 03/11/2022 in [facility] EMERGENCY DEPARTMENT ED from 07/20/2021 in [facility] EMERGENCY DEPARTMENT  C-SSRS RISK CATEGORY No Risk No Risk No Risk       Last PHQ 2/9 Scores:     No data to display          Scribe for Treatment Team: [name], LCSW 03/19/2022 10:05 AM

## 2022-03-19 NOTE — Progress Notes (Signed)
D: Patient alert and oriented. Patient denies pain. Patient denies anxiety and depression. Patient denies SI/HI/AVH. Patient isolative to room during shift with the exception to coming gout for meals and medications. Patient occasionally wanders around the unit.   A: Scheduled medications administered to patient, per MD orders.  Support and encouragement provided to patient.  Q15 minute safety checks maintained.   R: Patient compliant with medication administration and treatment plan. No adverse drug reactions noted. Patient remains safe on the unit at this time.

## 2022-03-19 NOTE — Group Note (Signed)
[facility] LCSW Group Therapy Note    Group Date: 03/19/2022 Start Time: 1300 End Time: 1400  Type of Therapy and Topic:  Group Therapy:  Overcoming Obstacles  Participation Level:  [facility] PARTICIPATION LEVEL: Did Not Attend   Description of Group:   In this group patients will be encouraged to explore what they see as obstacles to their own wellness and recovery. They will be guided to discuss their thoughts, feelings, and behaviors related to these obstacles. The group will process together ways to cope with barriers, with attention given to specific choices patients can make. Each patient will be challenged to identify changes they are motivated to make in order to overcome their obstacles. This group will be process-oriented, with patients participating in exploration of their own experiences as well as giving and receiving support and challenge from other group members.  Therapeutic Goals: 1. Patient will identify personal and current obstacles as they relate to admission. 2. Patient will identify barriers that currently interfere with their wellness or overcoming obstacles.  3. Patient will identify feelings, thought process and behaviors related to these barriers. 4. Patient will identify two changes they are willing to make to overcome these obstacles:    Summary of Patient Progress X   Therapeutic Modalities:   Cognitive Behavioral Therapy Solution Focused Therapy Motivational Interviewing Relapse Prevention Therapy   Latosha Gaylord R Kaidan Harpster, LCSW 

## 2022-03-19 NOTE — Progress Notes (Signed)
Recreation Therapy Notes  Date: 03/19/2022  Time: 9:50 am     Location: Courtyard   Behavioral response: Appropriate  Intervention Topic:  Strengths    Discussion/Intervention:  Group content today was focused on strengths. The group identified some of the strengths they have. Individuals stated reason why they do not use their strengths. Patients expressed what strengths others see in them. The group identified important reason to use their strengths. The group participated in the intervention "Picking strengths", where they had a chance to identify some of their strengths. Clinical Observations/Feedback: Patient came to group and was able to identify their personal strengths and how they can be used outside of here. Individual was social with peers and staff while participating in the intervention.    Jannet Calip LRT/CTRS         Tyja Gortney 03/19/2022 11:38 AM

## 2022-03-19 NOTE — [facility] Suicide Risk Assessment (Signed)
[facility] INPATIENT:  Family/Significant Other Suicide Prevention Education  Suicide Prevention Education:  Education Completed; [name], partner, [phone] has been identified by the patient as the family member/significant other with whom the patient will be residing, and identified as the person(s) who will aid the patient in the event of a mental health crisis (suicidal ideations/suicide attempt).  With written consent from the patient, the family member/significant other has been provided the following suicide prevention education, prior to the and/or following the discharge of the patient.  The suicide prevention education provided includes the following: Suicide risk factors Suicide prevention and interventions National Suicide Hotline telephone number [facility] assessment telephone number [facility] Emergency Assistance 911 [facility] and/or Residential Mobile Crisis Unit telephone number  Request made of family/significant other to: Remove weapons (e.g., guns, rifles, knives), all items previously/currently identified as safety concern.   Remove drugs/medications (over-the-counter, prescriptions, illicit drugs), all items previously/currently identified as a safety concern.  The family member/significant other verbalizes understanding of the suicide prevention education information provided.  The family member/significant other agrees to remove the items of safety concern listed above.  CSW spoke with patient's partner. He reports that the patient "she was on the medication that they prescribed her but the lack of taking the medications may be what caused that".  He reports that he was left out of the patient's care in the past.  He reports that patient had stopped taking her medications "for several months".  He reports that she has sibling stressors.  He reports that he was left out of the patients aftercare plans previously and he does not know who her  provider is or if current.  He reports that family is not helpful at this time.  He reports that patient had an ACTT team, not sure if she remains current with them.  No access to weapons that he is aware of.  He doesn't think that patient is a danger to self or others "but the emotion because we have children". He reports that she "still not quite back to her normal self".   Harden Mo 03/19/2022, 11:33 AM

## 2022-03-19 NOTE — Plan of Care (Signed)
  Problem: Education: Goal: Knowledge of the prescribed therapeutic regimen will improve Outcome: Progressing   Problem: Health Behavior/Discharge Planning: Goal: Compliance with prescribed medication regimen will improve Outcome: Progressing   Problem: Nutritional: Goal: Ability to achieve adequate nutritional intake will improve Outcome: Progressing   Problem: Role Relationship: Goal: Ability to communicate needs accurately will improve Outcome: Progressing   Problem: Education: Goal: Knowledge of [place] [facility] Education information/materials will improve Outcome: Progressing Goal: Verbalization of understanding the information provided will improve Outcome: Progressing   Problem: Health Behavior/Discharge Planning: Goal: Compliance with treatment plan for underlying cause of condition will improve Outcome: Progressing   Problem: Safety: Goal: Periods of time without injury will increase Outcome: Progressing

## 2022-03-19 NOTE — Progress Notes (Signed)
[facility] MD Progress Note  03/19/2022 7:36 AM Alyssa Mathis  MRN:  [id number]  Subjective: Follow-up for client with schizoaffective disorder, bipolar type. Nursing reports she is doing well.  She is quiet on assessment, lying in bed.  Per notes, no Issues with sleep or appetite.  Denies side effects form her medications.  Elevated blood pressure, clonidine ordered once.  Principal Problem: Schizoaffective disorder, bipolar type (HCC) Diagnosis: Principal Problem:   Schizoaffective disorder, bipolar type (HCC)  Total Time spent with patient: 30 minutes  Past Psychiatric History: Multiple recurrent episodes of psychotic symptoms with mood symptoms often  Past Medical History:  Past Medical History:  Diagnosis Date   Anemia    Hypertension    Post partum depression    Pregnancy induced hypertension    Schizoaffective disorder (HCC)     Past Surgical History:  Procedure Laterality Date   NO PAST SURGERIES     Family History:  Family History  Problem Relation Age of Onset   Hypertension Sister    Hypertension Maternal Grandmother    Diabetes Maternal Grandfather    Hypertension Maternal Grandfather    Family Psychiatric  History: See previous Social History:  Social History   Substance and Sexual Activity  Alcohol Use Never     Social History   Substance and Sexual Activity  Drug Use Not Currently    Social History   Socioeconomic History   Marital status: Single    Spouse name: Not on file   Number of children: Not on file   Years of education: Not on file   Highest education level: Not on file  Occupational History   Not on file  Tobacco Use   Smoking status: Every Day    Types: Cigarettes   Smokeless tobacco: Never  Vaping Use   Vaping Use: Not on file  Substance and Sexual Activity   Alcohol use: Never   Drug use: Not Currently   Sexual activity: Yes  Other Topics Concern   Not on file  Social History Narrative   Not on file   Social Determinants of  Health   Financial Resource Strain: Not on file  Food Insecurity: Not on file  Transportation Needs: Not on file  Physical Activity: Not on file  Stress: Not on file  Social Connections: Not on file   Additional Social History:   Sleep: Fair  Appetite:  Fair  Current Medications: Current Facility-Administered Medications  Medication Dose Route Frequency Provider Last Rate Last Admin   acetaminophen (TYLENOL) tablet 650 mg  650 mg Oral Q6H PRN [name] F, NP   650 mg at 03/16/22 0840   alum & mag hydroxide-simeth (MAALOX/MYLANTA) 200-200-20 MG/5ML suspension 30 mL  30 mL Oral Q4H PRN [name] F, NP       cephALEXin (KEFLEX) capsule 500 mg  500 mg Oral Q8H Barthold, Louise F, NP   500 mg at 03/18/22 1357   hydrOXYzine (ATARAX) tablet 25 mg  25 mg Oral TID PRN [name], NP       LORazepam (ATIVAN) tablet 2 mg  2 mg Oral Q6H PRN [name], [name], MD   2 mg at 03/17/22 2136   Or   LORazepam (ATIVAN) injection 2 mg  2 mg Intramuscular Q6H PRN [name], [name], MD       magnesium hydroxide (MILK OF MAGNESIA) suspension 30 mL  30 mL Oral Daily PRN [name] F, NP   30 mL at 03/15/22 0811   NIFEdipine (PROCARDIA-XL/NIFEDICAL-XL)  24 hr tablet 90 mg  90 mg Oral Daily [name], [name], MD   90 mg at 03/18/22 0850   OLANZapine zydis (ZYPREXA) disintegrating tablet 10 mg  10 mg Oral BID [name], [name], MD   10 mg at 03/18/22 1841    Lab Results: No results found for this or any previous visit (from the past 48 hour(s)).  Blood Alcohol level:  Lab Results  Component Value Date   ETH <10 03/12/2022   ETH <10 01/13/2021    Metabolic Disorder Labs: Lab Results  Component Value Date   HGBA1C 5.0 03/14/2022   MPG 97 03/14/2022   MPG 91 06/27/2020   No results found for: "PROLACTIN" Lab Results  Component Value Date   CHOL 143 03/14/2022   TRIG 40 03/14/2022   HDL 61 03/14/2022   CHOLHDL 2.3 03/14/2022   VLDL 8 03/14/2022   LDLCALC 74 03/14/2022    LDLCALC 82 06/27/2020    Musculoskeletal: Strength & Muscle Tone: within normal limits Gait & Station: normal Patient leans: N/A Psychiatric Specialty Exam: Physical Exam Vitals and nursing note reviewed.  Constitutional:      Appearance: Normal appearance.  HENT:     Head: Normocephalic.     Nose: Nose normal.  Pulmonary:     Effort: Pulmonary effort is normal.  Musculoskeletal:        General: Normal range of motion.  Neurological:     General: No focal deficit present.     Mental Status: She is alert and oriented to person, place, and time. Mental status is at baseline.  Psychiatric:        Attention and Perception: She is inattentive.        Mood and Affect: Affect is blunt.        Speech: Speech is delayed.        Behavior: Behavior is slowed.        Thought Content: Thought content is paranoid.        Cognition and Memory: Cognition is impaired.     Review of Systems  Constitutional: Negative.   HENT: Negative.    Eyes: Negative.   Respiratory: Negative.    Cardiovascular: Negative.   Gastrointestinal: Negative.   Musculoskeletal: Negative.   Skin: Negative.   Neurological: Negative.   Psychiatric/Behavioral:  Positive for depression and hallucinations.     Blood pressure (!) 188/106, pulse (!) 151, temperature 98.5 F (36.9 C), temperature source Oral, resp. rate 18, height 5\' 5"  (1.651 m), weight 72.6 kg, SpO2 98 %, unknown if currently breastfeeding.Body mass index is 26.63 kg/m.  General Appearance: Casual  Eye Contact:  Good  Speech:  Slow  Volume:  Decreased  Mood:  Depressed  Affect:  Flat  Thought Process:  Coherent  Orientation:  Full (Time, Place, and Person)  Thought Content:  Hallucinations: Auditory  Suicidal Thoughts:  No  Homicidal Thoughts:  No  Memory:  Immediate;   Fair Recent;   Fair Remote;   Fair  Judgement:  Fair  Insight:  Fair  Psychomotor Activity:  Decreased  Concentration:  Concentration: Fair and Attention Span: Fair   Recall:  of Knowledge:  Fair  Language:  Poor  Akathisia:  No  Handed:  Right  AIMS (if indicated):     Assets:  Leisure Time Physical Health Resilience  ADL's:  Intact  Cognition:  Impaired,  Mild  Sleep:        Physical Exam: Physical Exam Vitals and nursing note reviewed.  Constitutional:      Appearance: Normal appearance.  HENT:     Head: Normocephalic.     Nose: Nose normal.  Pulmonary:     Effort: Pulmonary effort is normal.  Musculoskeletal:        General: Normal range of motion.  Neurological:     General: No focal deficit present.     Mental Status: She is alert and oriented to person, place, and time. Mental status is at baseline.  Psychiatric:        Attention and Perception: She is inattentive.        Mood and Affect: Affect is blunt.        Speech: Speech is delayed.        Behavior: Behavior is slowed.        Thought Content: Thought content is paranoid.        Cognition and Memory: Cognition is impaired.    Review of Systems  Constitutional: Negative.   HENT: Negative.    Eyes: Negative.   Respiratory: Negative.    Cardiovascular: Negative.   Gastrointestinal: Negative.   Musculoskeletal: Negative.   Skin: Negative.   Neurological: Negative.   Psychiatric/Behavioral:  Positive for depression and hallucinations.    Blood pressure (!) 188/106, pulse (!) 151, temperature 98.5 F (36.9 C), temperature source Oral, resp. rate 18, height 5\' 5"  (1.651 m), weight 72.6 kg, SpO2 98 %, unknown if currently breastfeeding. Body mass index is 26.63 kg/m.   Treatment Plan Summary: Plan follow-up with continued medication management with antipsychotics.  No change to other medicine. Schizoaffective disorder, bipolar type Zyprexa 10 mg BID  Anxiety: Hydroxyzine 25 mg TID PRN  , NP 03/19/2022, 7:36 AM

## 2022-03-20 DIAGNOSIS — F250 Schizoaffective disorder, bipolar type: Secondary | ICD-10-CM | POA: Diagnosis not present

## 2022-03-20 MED ORDER — FLUCONAZOLE 100 MG PO TABS
200.0000 mg | ORAL_TABLET | Freq: Every day | ORAL | Status: AC
Start: 2022-03-20 — End: 2022-03-27
  Administered 2022-03-21 – 2022-03-26 (×6): 200 mg via ORAL
  Filled 2022-03-20 (×7): qty 2

## 2022-03-20 MED ORDER — CLONIDINE HCL 0.1 MG PO TABS
0.1000 mg | ORAL_TABLET | Freq: Every day | ORAL | Status: DC
  Administered 2022-03-20 – 2022-03-21 (×2): 0.1 mg via ORAL
  Filled 2022-03-20 (×2): qty 1

## 2022-03-20 NOTE — Progress Notes (Signed)
Pharmacy Antibiotic Note  Alyssa Mathis is a 36 y.o. female admitted on 03/13/2022 with  Oropharyngeal candidiasis .  Pharmacy has been consulted for fluconazole dosing.  Plan: Fluconazole 200 mg PO daily X 7 days ordered to start on 7/4 @ 2200.   Height: 5\' 5"  (165.1 cm) Weight: 72.6 kg (160 lb) IBW/kg (Calculated) : 57  Temp (24hrs), Avg:98.7 F (37.1 C), Min:98.6 F (37 C), Max:98.8 F (37.1 C)  No results for input(s): "WBC", "CREATININE", "LATICACIDVEN", "VANCOTROUGH", "VANCOPEAK", "VANCORANDOM", "GENTTROUGH", "GENTPEAK", "GENTRANDOM", "TOBRATROUGH", "TOBRAPEAK", "TOBRARND", "AMIKACINPEAK", "AMIKACINTROU", "AMIKACIN" in the last 168 hours.  Estimated Creatinine Clearance: 97.9 mL/min (by C-G formula based on SCr of 0.66 mg/dL).    Allergies  Allergen Reactions   Paliperidone Other (See Comments)    Dystonia   Latex Rash    Antimicrobials this admission:   >>    >>   Dose adjustments this admission:   Microbiology results:  BCx:   UCx:    Sputum:    MRSA PCR:   Thank you for allowing pharmacy to be a part of this patient's care.  Abednego Yeates D 03/20/2022 9:53 PM

## 2022-03-20 NOTE — Progress Notes (Addendum)
[facility] MD Progress Note  03/20/2022 9:14 AM Alyssa Mathis  MRN:  [id number]  Subjective: Follow-up for client with schizoaffective disorder, bipolar type. She is completing her menu in the day room.  Pleasant, slow to respond, cognitive challenges.  Denies side effects from her medications.  Sleep is "ok", appetite is "good".  Complains of some depression and worry, "both", no suicidal ideations, denies psychosis.  Some psychomotor retardation, improvement noted.  Blood pressure continues to be elevated, PRN clonidine given yesterday with positive results, ordered daily at this time.  Principal Problem: Schizoaffective disorder, bipolar type (HCC) Diagnosis: Principal Problem:   Schizoaffective disorder, bipolar type (HCC)  Total Time spent with patient: 30 minutes  Past Psychiatric History: Multiple recurrent episodes of psychotic symptoms with mood symptoms often  Past Medical History:  Past Medical History:  Diagnosis Date   Anemia    Hypertension    Post partum depression    Pregnancy induced hypertension    Schizoaffective disorder (HCC)     Past Surgical History:  Procedure Laterality Date   NO PAST SURGERIES     Family History:  Family History  Problem Relation Age of Onset   Hypertension Sister    Hypertension Maternal Grandmother    Diabetes Maternal Grandfather    Hypertension Maternal Grandfather    Family Psychiatric  History: See previous Social History:  Social History   Substance and Sexual Activity  Alcohol Use Never     Social History   Substance and Sexual Activity  Drug Use Not Currently    Social History   Socioeconomic History   Marital status: Single    Spouse name: Not on file   Number of children: Not on file   Years of education: Not on file   Highest education level: Not on file  Occupational History   Not on file  Tobacco Use   Smoking status: Every Day    Types: Cigarettes   Smokeless tobacco: Never  Vaping Use   Vaping Use: Not on  file  Substance and Sexual Activity   Alcohol use: Never   Drug use: Not Currently   Sexual activity: Yes  Other Topics Concern   Not on file  Social History Narrative   Not on file   Social Determinants of Health   Financial Resource Strain: Not on file  Food Insecurity: Not on file  Transportation Needs: Not on file  Physical Activity: Not on file  Stress: Not on file  Social Connections: Not on file   Additional Social History:   Sleep: Fair  Appetite:  Fair  Current Medications: Current Facility-Administered Medications  Medication Dose Route Frequency Provider Last Rate Last Admin   acetaminophen (TYLENOL) tablet 650 mg  650 mg Oral Q6H PRN [name] F, NP   650 mg at 03/16/22 0840   alum & mag hydroxide-simeth (MAALOX/MYLANTA) 200-200-20 MG/5ML suspension 30 mL  30 mL Oral Q4H PRN [name] F, NP       hydrOXYzine (ATARAX) tablet 25 mg  25 mg Oral TID PRN [name], NP       LORazepam (ATIVAN) tablet 2 mg  2 mg Oral Q6H PRN [name], [name], MD   2 mg at 03/19/22 2117   Or   LORazepam (ATIVAN) injection 2 mg  2 mg Intramuscular Q6H PRN [name], [name], MD       magnesium hydroxide (MILK OF MAGNESIA) suspension 30 mL  30 mL Oral Daily PRN [name], NP   30 mL  at 03/15/22 0811   NIFEdipine (PROCARDIA-XL/NIFEDICAL-XL) 24 hr tablet 90 mg  90 mg Oral Daily [name], [name], MD   90 mg at 03/20/22 0833   OLANZapine zydis (ZYPREXA) disintegrating tablet 10 mg  10 mg Oral BID [name], [name], MD   10 mg at 03/20/22 1856    Lab Results: No results found for this or any previous visit (from the past 48 hour(s)).  Blood Alcohol level:  Lab Results  Component Value Date   ETH <10 03/12/2022   ETH <10 01/13/2021    Metabolic Disorder Labs: Lab Results  Component Value Date   HGBA1C 5.0 03/14/2022   MPG 97 03/14/2022   MPG 91 06/27/2020   No results found for: "PROLACTIN" Lab Results  Component Value Date   CHOL 143 03/14/2022   TRIG  40 03/14/2022   HDL 61 03/14/2022   CHOLHDL 2.3 03/14/2022   VLDL 8 03/14/2022   LDLCALC 74 03/14/2022   LDLCALC 82 06/27/2020    Musculoskeletal: Strength & Muscle Tone: within normal limits Gait & Station: normal Patient leans: N/A Psychiatric Specialty Exam: Physical Exam Vitals and nursing note reviewed.  Constitutional:      Appearance: Normal appearance.  HENT:     Head: Normocephalic.     Nose: Nose normal.  Pulmonary:     Effort: Pulmonary effort is normal.  Musculoskeletal:        General: Normal range of motion.  Neurological:     General: No focal deficit present.     Mental Status: She is alert and oriented to person, place, and time. Mental status is at baseline.  Psychiatric:        Attention and Perception: She is inattentive.        Mood and Affect: Affect is blunt.        Speech: Speech is delayed.        Behavior: Behavior is slowed.        Thought Content: Thought content is paranoid.        Cognition and Memory: Cognition is impaired.     Review of Systems  Constitutional: Negative.   HENT: Negative.    Eyes: Negative.   Respiratory: Negative.    Cardiovascular: Negative.   Gastrointestinal: Negative.   Musculoskeletal: Negative.   Skin: Negative.   Neurological: Negative.   Psychiatric/Behavioral:  Positive for depression and dysphoric mood. The patient is nervous/anxious.     Blood pressure (!) 143/97, pulse 92, temperature 98.6 F (37 C), temperature source Oral, resp. rate 18, height 5\' 5"  (1.651 m), weight 72.6 kg, SpO2 100 %, unknown if currently breastfeeding.Body mass index is 26.63 kg/m.  General Appearance: Casual  Eye Contact:  Good  Speech:  Slow  Volume:  Decreased  Mood:  Depressed and anxious  Affect:  Flat  Thought Process:  Coherent  Orientation:  Full (Time, Place, and Person)  Thought Content:  Hallucinations: Auditory  Suicidal Thoughts:  No  Homicidal Thoughts:  No  Memory:  Immediate;   Fair Recent;    Fair Remote;   Fair  Judgement:  Fair  Insight:  Fair  Psychomotor Activity:  Decreased  Concentration:  Concentration: Fair and Attention Span: Fair  Recall:  of Knowledge:  Fair  Language:  Poor  Akathisia:  No  Handed:  Right  AIMS (if indicated):     Assets:  Leisure Time Physical Health Resilience  ADL's:  Intact  Cognition:  Impaired,  Mild  Sleep:  Physical Exam: Physical Exam Vitals and nursing note reviewed.  Constitutional:      Appearance: Normal appearance.  HENT:     Head: Normocephalic.     Nose: Nose normal.  Pulmonary:     Effort: Pulmonary effort is normal.  Musculoskeletal:        General: Normal range of motion.  Neurological:     General: No focal deficit present.     Mental Status: She is alert and oriented to person, place, and time. Mental status is at baseline.  Psychiatric:        Attention and Perception: She is inattentive.        Mood and Affect: Affect is blunt.        Speech: Speech is delayed.        Behavior: Behavior is slowed.        Thought Content: Thought content is paranoid.        Cognition and Memory: Cognition is impaired.    Review of Systems  Constitutional: Negative.   HENT: Negative.    Eyes: Negative.   Respiratory: Negative.    Cardiovascular: Negative.   Gastrointestinal: Negative.   Musculoskeletal: Negative.   Skin: Negative.   Neurological: Negative.   Psychiatric/Behavioral:  Positive for depression and dysphoric mood. The patient is nervous/anxious.    Blood pressure (!) 143/97, pulse 92, temperature 98.6 F (37 C), temperature source Oral, resp. rate 18, height 5\' 5"  (1.651 m), weight 72.6 kg, SpO2 100 %, unknown if currently breastfeeding. Body mass index is 26.63 kg/m.   Treatment Plan Summary: Plan follow-up with continued medication management with antipsychotics.  No change to other medicine. Schizoaffective disorder, bipolar type Zyprexa 10 mg BID  Anxiety: Hydroxyzine 25 mg  TID PRN  , NP 03/20/2022, 9:14 AM

## 2022-03-20 NOTE — Plan of Care (Signed)
  ProblemD: Pt alert and oriented. Pt rates depression 0/10, hopelessness 0/10, and anxiety 1/10. Pt denies experiencing any pain at this time. Pt denies experiencing any SI/HI, or AVH at this time.   Pt is observed as having some confusion. Pt asks the same question multiple times to this Clinical research associate and other staff members. Pt is also observed a paranoid asking for children's father to be called but for [name] not to speak to him. Pt is also observed as very watchful.   A: Scheduled medications administered to pt, per MD orders. Support and encouragement provided. Frequent verbal contact made. Routine safety checks conducted q15 minutes.   R: No adverse drug reactions noted. Pt verbally contracts for safety at this time. Pt compliant with medications and treatment plan. Pt interacts well with others on the unit. Pt remains safe at this time. Will continue to monitor.  Education: Goal: Knowledge of General Education information will improve Description: Including pain rating scale, medication(s)/side effects and non-pharmacologic comfort measures Outcome: Progressing   Problem: Nutrition: Goal: Adequate nutrition will be maintained Outcome: Progressing

## 2022-03-20 NOTE — Group Note (Signed)
LCSW Group Therapy Note   Group Date: 03/20/2022 Start Time: 1300 End Time: 1400   Type of Therapy and Topic:  Group Therapy: Boundaries  Participation Level:  Active  Description of Group: This group will address the use of boundaries in their personal lives. Patients will explore why boundaries are important, the difference between healthy and unhealthy boundaries, and negative and postive outcomes of different boundaries and will look at how boundaries can be crossed.  Patients will be encouraged to identify current boundaries in their own lives and identify what kind of boundary is being set. Facilitators will guide patients in utilizing problem-solving interventions to address and correct types boundaries being used and to address when no boundary is being used. Understanding and applying boundaries will be explored and addressed for obtaining and maintaining a balanced life. Patients will be encouraged to explore ways to assertively make their boundaries and needs known to significant others in their lives, using other group members and facilitator for role play, support, and feedback.  Therapeutic Goals:  1.  Patient will identify areas in their life where setting clear boundaries could be  used to improve their life.  2.  Patient will identify signs/triggers that a boundary is not being respected. 3.  Patient will identify two ways to set boundaries in order to achieve balance in  their lives: 4.  Patient will demonstrate ability to communicate their needs and set boundaries  through discussion and/or role plays  Summary of Patient Progress:  Patient was present for entirety of group. Showed marked difficulty understanding topic of discussion, regardless of complexity. Appears to have clouded cognitions and limited expressive capability. Patient often became frustrated at inability to explain herself, though made attempt to communicate. Would become fixated on certain conversations  causing conversation to halt.   Therapeutic Modalities:   Cognitive Behavioral Therapy Solution-Focused Therapy  Almedia Balls 03/20/2022  2:54 PM

## 2022-03-21 DIAGNOSIS — F250 Schizoaffective disorder, bipolar type: Secondary | ICD-10-CM | POA: Diagnosis not present

## 2022-03-21 MED ORDER — BENZTROPINE MESYLATE 1 MG PO TABS
0.5000 mg | ORAL_TABLET | Freq: Every day | ORAL | Status: DC
  Administered 2022-03-22: 0.5 mg via ORAL
  Filled 2022-03-21: qty 1

## 2022-03-21 MED ORDER — HALOPERIDOL 5 MG PO TABS
5.0000 mg | ORAL_TABLET | Freq: Every day | ORAL | Status: DC
  Administered 2022-03-21 – 2022-03-22 (×2): 5 mg via ORAL
  Filled 2022-03-21 (×2): qty 1

## 2022-03-21 MED ORDER — METOPROLOL SUCCINATE ER 25 MG PO TB24
25.0000 mg | ORAL_TABLET | Freq: Every day | ORAL | Status: DC
  Administered 2022-03-21 – 2022-04-03 (×12): 25 mg via ORAL
  Filled 2022-03-21 (×13): qty 1

## 2022-03-21 MED ORDER — OLANZAPINE 10 MG PO TBDP
10.0000 mg | ORAL_TABLET | Freq: Every day | ORAL | Status: DC
  Administered 2022-03-23 – 2022-03-25 (×2): 10 mg via ORAL
  Filled 2022-03-21 (×2): qty 1

## 2022-03-21 NOTE — [facility] Group Notes (Signed)
[facility] Group Notes:  (Nursing/MHT/Case Management/Adjunct)  Date:  03/21/2022  Time:  9:12 AM  Type of Therapy:   Community Meeting  Participation Level:  Active  Participation Quality:  Appropriate and Attentive  Affect:  Appropriate  Cognitive:  Alert and Appropriate  Insight:  Appropriate  Engagement in Group:  Engaged  Modes of Intervention:  Discussion and Support  Summary of Progress/Problems:  Lynelle Smoke Sederick Jacobsen 03/21/2022, 9:12 AM

## 2022-03-21 NOTE — Plan of Care (Signed)
  Problem: Education: Goal: Knowledge of General Education information will improve Description: Including pain rating scale, medication(s)/side effects and non-pharmacologic comfort measures Outcome: Progressing   Problem: Health Behavior/Discharge Planning: Goal: Ability to manage health-related needs will improve Outcome: Progressing   Problem: Clinical Measurements: Goal: Ability to maintain clinical measurements within normal limits will improve Outcome: Progressing Goal: Will remain free from infection Outcome: Progressing Goal: Diagnostic test results will improve Outcome: Progressing Goal: Respiratory complications will improve Outcome: Progressing Goal: Cardiovascular complication will be avoided Outcome: Progressing   Problem: Nutrition: Goal: Adequate nutrition will be maintained Outcome: Progressing   Problem: Coping: Goal: Level of anxiety will decrease Outcome: Progressing   Problem: Elimination: Goal: Will not experience complications related to bowel motility Outcome: Progressing Goal: Will not experience complications related to urinary retention Outcome: Progressing   Problem: Pain Managment: Goal: General experience of comfort will improve Outcome: Progressing   Problem: Safety: Goal: Periods of time without injury will increase Outcome: Progressing   Problem: Physical Regulation: Goal: Ability to maintain clinical measurements within normal limits will improve Outcome: Progressing   

## 2022-03-21 NOTE — Plan of Care (Signed)
D: Pt alert and oriented. Pt rates depression 5/10, hopelessness 5/10, and anxiety 6/10. Pt goal: "I want to be able to concentrate." Pt reports energy level as low and concentration as being poor. Pt reports sleep last night as being poor. Pt did not receive medications for sleep and did not find them helpful. Pt denies experiencing any pain at this time. Pt denies experiencing any SI/HI, or VH at this time however, endorses hearing voices.   Pt expressed this morning that she was having difficulty filling out her self inventory. Pt stated she was having trouble concentrating and that she felt her mind was all over the place. Pt then began to cry. This Clinical research associate reassured her that this Clinical research associate and other staff would assist her with focusing and staying on task. MD notified of pt's concerns. MD verbally order for morning dose of Zyprexa to not be given, dose was held. Later pt began to tell staff she was sorry for what she had done. Pt was then found standing next to another pt's bed followed by being observed hugging another pt. Pt was corrected and reminded of the unit policies and procedures. This evening during medication administration the pt was paranoid about her medications even after being educated on what they were for and their importance. Pt initially took put them in her mouth as if she was going to take them, then spit them into her medication cup, followed by swallowing the contents in her medication cup. MD notified of pt's behavior. Pt also observed singing gospel songs this evening outside the nurse's station.  A: Scheduled medications administered to pt, per MD orders. Support and encouragement provided. Frequent verbal contact made. Routine safety checks conducted q15 minutes.   R: No adverse drug reactions noted. Pt verbally contracts for safety at this time. Pt compliant with morning medication however, was hesitant and apprehensive with evening medications. Pt interacted well with others on  the unit this morning however, observed paranoid and interacting poorly this eveing. Pt remains safe at this time. Will continue to monitor.   Problem: Education: Goal: Knowledge of General Education information will improve Description: Including pain rating scale, medication(s)/side effects and non-pharmacologic comfort measures Outcome: Progressing   Problem: Coping: Goal: Level of anxiety will decrease Outcome: Not Progressing

## 2022-03-21 NOTE — Progress Notes (Signed)
Patient is pleasant. Presents disorganized in thought and speech at times. Denies si/hi/avh depression and anxiety at this encounter.  Although she denies all, she is preoccupied in thought and endorsed hearing voices in her head.  She also presented to staff  with thrush in her mouth. Most likely due to the recent antibiotic prescribed to treat her UTI.  Will contact on call and make them aware of the issue.  Will continue to monitor with q15 minute safety checks. Encouraged he to seek staff with any concerns.     C Butler-Nicholson, LPN

## 2022-03-21 NOTE — Progress Notes (Signed)
[facility] MD Progress Note  03/21/2022 2:58 PM Alyssa Mathis  MRN:  [id number]  Subjective: Follow-up for client with schizoaffective disorder, bipolar type. Her Zyprexa was held this morning due to cognition issues which improved.  However, her hallucinations and delusions increased.  She apologized to everyone as she felt we were all trying to kill her.  She is anxious and redirection by staff needed at times.  Medications adjusted to assist her symptoms.  Principal Problem: Schizoaffective disorder, bipolar type (HCC) Diagnosis: Principal Problem:   Schizoaffective disorder, bipolar type (HCC)  Total Time spent with patient: 30 minutes  Past Psychiatric History: Multiple recurrent episodes of psychotic symptoms with mood symptoms often  Past Medical History:  Past Medical History:  Diagnosis Date   Anemia    Hypertension    Post partum depression    Pregnancy induced hypertension    Schizoaffective disorder (HCC)     Past Surgical History:  Procedure Laterality Date   NO PAST SURGERIES     Family History:  Family History  Problem Relation Age of Onset   Hypertension Sister    Hypertension Maternal Grandmother    Diabetes Maternal Grandfather    Hypertension Maternal Grandfather    Family Psychiatric  History: See previous Social History:  Social History   Substance and Sexual Activity  Alcohol Use Never     Social History   Substance and Sexual Activity  Drug Use Not Currently    Social History   Socioeconomic History   Marital status: Single    Spouse name: Not on file   Number of children: Not on file   Years of education: Not on file   Highest education level: Not on file  Occupational History   Not on file  Tobacco Use   Smoking status: Every Day    Types: Cigarettes   Smokeless tobacco: Never  Vaping Use   Vaping Use: Not on file  Substance and Sexual Activity   Alcohol use: Never   Drug use: Not Currently   Sexual activity: Yes  Other Topics  Concern   Not on file  Social History Narrative   Not on file   Social Determinants of Health   Financial Resource Strain: Not on file  Food Insecurity: Not on file  Transportation Needs: Not on file  Physical Activity: Not on file  Stress: Not on file  Social Connections: Not on file   Additional Social History:   Sleep: Fair  Appetite:  Fair  Current Medications: Current Facility-Administered Medications  Medication Dose Route Frequency Provider Last Rate Last Admin   acetaminophen (TYLENOL) tablet 650 mg  650 mg Oral Q6H PRN [name] F, NP   650 mg at 03/16/22 0840   alum & mag hydroxide-simeth (MAALOX/MYLANTA) 200-200-20 MG/5ML suspension 30 mL  30 mL Oral Q4H PRN [name] F, NP       cloNIDine (CATAPRES) tablet 0.1 mg  0.1 mg Oral Daily Shaune Pollack, Christophr Calix Y, NP   0.1 mg at 03/21/22 0859   fluconazole (DIFLUCAN) tablet 200 mg  200 mg Oral Daily [name], RPH   200 mg at 03/21/22 0859   hydrOXYzine (ATARAX) tablet 25 mg  25 mg Oral TID PRN [name], NP   25 mg at 03/20/22 2127   LORazepam (ATIVAN) tablet 2 mg  2 mg Oral Q6H PRN [name], [name], MD   2 mg at 03/19/22 2117   Or   LORazepam (ATIVAN) injection 2 mg  2 mg Intramuscular Q6H  PRN [name], [name], MD       magnesium hydroxide (MILK OF MAGNESIA) suspension 30 mL  30 mL Oral Daily PRN [name] F, NP   30 mL at 03/15/22 0811   NIFEdipine (PROCARDIA-XL/NIFEDICAL-XL) 24 hr tablet 90 mg  90 mg Oral Daily [name], [name], MD   90 mg at 03/21/22 0859   OLANZapine zydis (ZYPREXA) disintegrating tablet 10 mg  10 mg Oral BID [name], [name], MD   10 mg at 03/20/22 1659    Lab Results: No results found for this or any previous visit (from the past 48 hour(s)).  Blood Alcohol level:  Lab Results  Component Value Date   ETH <10 03/12/2022   ETH <10 01/13/2021    Metabolic Disorder Labs: Lab Results  Component Value Date   HGBA1C 5.0 03/14/2022   MPG 97 03/14/2022   MPG 91 06/27/2020    No results found for: "PROLACTIN" Lab Results  Component Value Date   CHOL 143 03/14/2022   TRIG 40 03/14/2022   HDL 61 03/14/2022   CHOLHDL 2.3 03/14/2022   VLDL 8 03/14/2022   LDLCALC 74 03/14/2022   LDLCALC 82 06/27/2020    Musculoskeletal: Strength & Muscle Tone: within normal limits Gait & Station: normal Patient leans: N/A Psychiatric Specialty Exam: Physical Exam Vitals and nursing note reviewed.  Constitutional:      Appearance: Normal appearance.  HENT:     Head: Normocephalic.     Nose: Nose normal.  Pulmonary:     Effort: Pulmonary effort is normal.  Musculoskeletal:        General: Normal range of motion.     Cervical back: Normal range of motion.  Neurological:     General: No focal deficit present.     Mental Status: She is alert and oriented to person, place, and time. Mental status is at baseline.  Psychiatric:        Attention and Perception: She is inattentive. She perceives auditory and visual hallucinations.        Mood and Affect: Mood is anxious. Affect is blunt.        Speech: Speech is delayed.        Behavior: Behavior is slowed.        Thought Content: Thought content is paranoid.        Cognition and Memory: Cognition is impaired.        Judgment: Judgment is inappropriate.     Review of Systems  Constitutional: Negative.   HENT: Negative.    Eyes: Negative.   Respiratory: Negative.    Cardiovascular: Negative.   Gastrointestinal: Negative.   Musculoskeletal: Negative.   Skin: Negative.   Neurological: Negative.   Psychiatric/Behavioral:  Positive for depression, dysphoric mood and hallucinations. The patient is nervous/anxious.     Blood pressure (!) 146/95, pulse 90, temperature 98.5 F (36.9 C), temperature source Oral, resp. rate 18, height 5\' 5"  (1.651 m), weight 72.6 kg, SpO2 99 %, unknown if currently breastfeeding.Body mass index is 26.63 kg/m.  General Appearance: Casual  Eye Contact:  Good  Speech:  Slow  Volume:   Decreased  Mood:  Depressed and anxious  Affect:  Flat  Thought Process:  Coherent  Orientation:  Full (Time, Place, and Person)  Thought Content:  Hallucinations: Auditory, delusions  Suicidal Thoughts:  No  Homicidal Thoughts:  No  Memory:  Immediate;   Fair Recent;   Fair Remote;   Fair  Judgement:  Fair  Insight:  Fair  Psychomotor Activity:  Decreased  Concentration:  Concentration: Fair and Attention Span: Fair  Recall:  [employer] of Knowledge:  Fair  Language:  Poor  Akathisia:  No  Handed:  Right  AIMS (if indicated):     Assets:  Leisure Time Physical Health Resilience  ADL's:  Intact  Cognition:  Impaired,  Mild  Sleep:        Physical Exam: Physical Exam Vitals and nursing note reviewed.  Constitutional:      Appearance: Normal appearance.  HENT:     Head: Normocephalic.     Nose: Nose normal.  Pulmonary:     Effort: Pulmonary effort is normal.  Musculoskeletal:        General: Normal range of motion.     Cervical back: Normal range of motion.  Neurological:     General: No focal deficit present.     Mental Status: She is alert and oriented to person, place, and time. Mental status is at baseline.  Psychiatric:        Attention and Perception: She is inattentive. She perceives auditory and visual hallucinations.        Mood and Affect: Mood is anxious. Affect is blunt.        Speech: Speech is delayed.        Behavior: Behavior is slowed.        Thought Content: Thought content is paranoid.        Cognition and Memory: Cognition is impaired.        Judgment: Judgment is inappropriate.    Review of Systems  Constitutional: Negative.   HENT: Negative.    Eyes: Negative.   Respiratory: Negative.    Cardiovascular: Negative.   Gastrointestinal: Negative.   Musculoskeletal: Negative.   Skin: Negative.   Neurological: Negative.   Psychiatric/Behavioral:  Positive for depression, dysphoric mood and hallucinations. The patient is nervous/anxious.     Blood pressure (!) 146/95, pulse 90, temperature 98.5 F (36.9 C), temperature source Oral, resp. rate 18, height 5\' 5"  (1.651 m), weight 72.6 kg, SpO2 99 %, unknown if currently breastfeeding. Body mass index is 26.63 kg/m.   Treatment Plan Summary: Plan follow-up with continued medication management with antipsychotics.  No change to other medicine. Schizoaffective disorder, bipolar type Zyprexa 10 mg BID decreased to daily at bedtime STarted Haldol 5 mg daily  EPS: Started Cogentin 0.5 mg daily  Anxiety: Hydroxyzine 25 mg TID PRN  , NP 03/21/2022, 2:58 PM

## 2022-03-21 NOTE — Progress Notes (Signed)
Recreation Therapy Notes  Date: 03/21/2022  Time: 10:15 am     Location: Craft room   Behavioral response: Appropriate,Confused   Intervention Topic:  Time Management    Discussion/Intervention:  Group content today was focused on time management. The group defined time management and identified healthy ways to manage time. Individuals expressed how much of the 24 hours they use in a day. Patients expressed how much time they use just for themselves personally. The group expressed how they have managed their time in the past. Individuals participated in the intervention "Managing Life" where they had a chance to see how much of the 24 hours they use and where it goes. Clinical Observations/Feedback: Patient came to group and need help to focus and attempt the task at hand. Participant expressed that she was confused and her thought were not together. She also mention she was scared. Individual was social with peers and staff while participating in the intervention.    Donnis Pecha LRT/CTRS         Marni Franzoni 03/21/2022 12:18 PM

## 2022-03-21 NOTE — Group Note (Signed)
[facility] LCSW Group Therapy Note   Group Date: 03/21/2022 Start Time: 1300 End Time: 1400   Type of Therapy/Topic:  Group Therapy:  Emotion Regulation  Participation Level:  Did Not Attend    Description of Group:    The purpose of this group is to assist patients in learning to regulate negative emotions and experience positive emotions. Patients will be guided to discuss ways in which they have been vulnerable to their negative emotions. These vulnerabilities will be juxtaposed with experiences of positive emotions or situations, and patients challenged to use positive emotions to combat negative ones. Special emphasis will be placed on coping with negative emotions in conflict situations, and patients will process healthy conflict resolution skills.  Therapeutic Goals: Patient will identify two positive emotions or experiences to reflect on in order to balance out negative emotions:  Patient will label two or more emotions that they find the most difficult to experience:  Patient will be able to demonstrate positive conflict resolution skills through discussion or role plays:   Summary of Patient Progress: Pt did not attend group despite invitation.   Therapeutic Modalities:   Cognitive Behavioral Therapy Feelings Identification Dialectical Behavioral Therapy   Paisley Grajeda R Areyana Leoni, LCSW 

## 2022-03-22 DIAGNOSIS — F250 Schizoaffective disorder, bipolar type: Secondary | ICD-10-CM | POA: Diagnosis not present

## 2022-03-22 MED ORDER — BENZTROPINE MESYLATE 1 MG PO TABS
0.5000 mg | ORAL_TABLET | Freq: Two times a day (BID) | ORAL | Status: DC
  Administered 2022-03-22 – 2022-04-03 (×24): 0.5 mg via ORAL
  Filled 2022-03-22 (×25): qty 1

## 2022-03-22 MED ORDER — HALOPERIDOL 5 MG PO TABS
5.0000 mg | ORAL_TABLET | Freq: Two times a day (BID) | ORAL | Status: DC
Start: 2022-03-22 — End: 2022-04-03
  Administered 2022-03-22 – 2022-04-03 (×24): 5 mg via ORAL
  Filled 2022-03-22 (×24): qty 1

## 2022-03-22 NOTE — Progress Notes (Signed)
Patient has been pacing the halls.  Had to be redirected to come off another hallway where she was observed knocking on a female patient's door and attempting to enter.  She continues to be disorganized in thought and preoccupied. Appears to be responding to internal stimuli as she has been observed talking to herself as she walks the hall. When questioned she denies si/hi/avh depression and pain. She states that she has "a little anxiety"She remains safe on the unit with q 15 minute safety checks and will continue to monitor.     C Butler-Nicholson, LPN

## 2022-03-22 NOTE — Plan of Care (Signed)
D: Pt alert and oriented. Pt rates depression 0/10, hopelessness 0/10, and anxiety 2/10. Pt goal: "Getting out to be with kids but first taking care of self." Pt reports energy level as normal and concentration as being good. Pt reports sleep last night as being good. Pt did not receive medications for sleep and did not find them helpful. Pt denies experiencing any pain at this time. Pt denies experiencing any SI/HI, or AVH at this time however, is observed as responding to internal stimuli.   A: Scheduled medications administered to pt, per MD orders. Support and encouragement provided. Frequent verbal contact made. Routine safety checks conducted q15 minutes.   R: No adverse drug reactions noted. Pt verbally contracts for safety at this time. Pt compliant with medications however is very apprehensive. Pt interacts well with others on the unit at times and poorly at other times. Pt remains safe at this time. Will continue to monitor.   Problem: Education: Goal: Knowledge of General Education information will improve Description: Including pain rating scale, medication(s)/side effects and non-pharmacologic comfort measures Outcome: Not Progressing   Problem: Coping: Goal: Level of anxiety will decrease Outcome: Not Progressing

## 2022-03-22 NOTE — Progress Notes (Addendum)
Recreation Therapy Notes  Date: 03/22/2022   Time: 10:20 am    Location: Courtyard   Behavioral response: Appropriate  Intervention Topic:  Wellness    Discussion/Intervention:  Group content today was focused on Wellness. The group defined wellness and some positive ways they make decisions for themselves. Individuals expressed reasons why they neglected any wellness in the past. Patients described ways to improve wellness skills in the future. The group explained what could happen if they did not do any wellness at all. Participants express how bad choices has affected them and others around them. Individual explained the importance of wellness. The group participated in the intervention "Testing my Wellness" where they had a chance to identify some of their weaknesses and strengths in wellness.  Clinical Observations/Feedback: Patient came to group and expressed that she listens to gospel music for her wellness.Individual was social with peers and staff while participating in the intervention.     Everley Evora LRT/CTRS         Zayveon Raschke 03/22/2022 12:31 PM

## 2022-03-22 NOTE — [facility] Group Notes (Signed)
[facility] Group Notes:  (Nursing/MHT/Case Management/Adjunct)  Date:  03/22/2022  Time:  10:41 AM  Type of Therapy:   Community Meeting  Participation Level:  Active  Participation Quality:  Appropriate and Attentive  Affect:  Appropriate  Cognitive:  Alert and Appropriate  Insight:  Appropriate  Engagement in Group:  Engaged  Modes of Intervention:  Discussion, Education, and Support  Summary of Progress/Problems:  Alyssa Mathis Verlin Uher 03/22/2022, 10:41 AM

## 2022-03-22 NOTE — Progress Notes (Signed)
Patient had been sleep most of the shift. Awoke to shower and get snack.  She is pleasant and cooperative.  Still appears to be responding.  Delayed reaction during conversations.  Despite all she continues to denies si/hi/avh/ She does endorse depression stating she is missing her kids. She has no qhs medications and declined the need for any medication to help with anxiety or sleep.      C Butler-Nicholson, LPN

## 2022-03-22 NOTE — Plan of Care (Signed)
  Problem: Safety: Goal: Periods of time without injury will increase Outcome: Progressing   Problem: Physical Regulation: Goal: Ability to maintain clinical measurements within normal limits will improve Outcome: Progressing   Problem: Coping: Goal: Ability to verbalize frustrations and anger appropriately will improve Outcome: Progressing Goal: Ability to demonstrate self-control will improve Outcome: Progressing   Problem: Education: Goal: Knowledge of General Education information will improve Description: Including pain rating scale, medication(s)/side effects and non-pharmacologic comfort measures Outcome: Progressing   Problem: Health Behavior/Discharge Planning: Goal: Ability to manage health-related needs will improve Outcome: Progressing   Problem: Clinical Measurements: Goal: Ability to maintain clinical measurements within normal limits will improve Outcome: Progressing Goal: Will remain free from infection Outcome: Progressing Goal: Diagnostic test results will improve Outcome: Progressing Goal: Respiratory complications will improve Outcome: Progressing Goal: Cardiovascular complication will be avoided Outcome: Progressing   Problem: Activity: Goal: Risk for activity intolerance will decrease Outcome: Progressing   Problem: Coping: Goal: Level of anxiety will decrease Outcome: Progressing   Problem: Elimination: Goal: Will not experience complications related to bowel motility Outcome: Progressing Goal: Will not experience complications related to urinary retention Outcome: Progressing

## 2022-03-22 NOTE — Progress Notes (Addendum)
[facility]iatric MD Progress Note  03/22/2022 10:46 AM Alyssa Mathis  MRN:  [id number]  Subjective: Follow-up for client with schizoaffective disorder, bipolar type. She was very paranoid this morning.  Since taking her Haldol it seems to have resolved.  Continues to be delusional and responding to internal stimuli.  She was able to participate in groups.  Medications adjusted to assist her symptoms.  Principal Problem: Schizoaffective disorder, bipolar type (HCC) Diagnosis: Principal Problem:   Schizoaffective disorder, bipolar type (HCC)  Total Time spent with patient: 30 minutes  Past Psychiatric History: Multiple recurrent episodes of psychotic symptoms with mood symptoms often  Past Medical History:  Past Medical History:  Diagnosis Date   Anemia    Hypertension    Post partum depression    Pregnancy induced hypertension    Schizoaffective disorder (HCC)     Past Surgical History:  Procedure Laterality Date   NO PAST SURGERIES     Family History:  Family History  Problem Relation Age of Onset   Hypertension Sister    Hypertension Maternal Grandmother    Diabetes Maternal Grandfather    Hypertension Maternal Grandfather    Family Psychiatric  History: See previous Social History:  Social History   Substance and Sexual Activity  Alcohol Use Never     Social History   Substance and Sexual Activity  Drug Use Not Currently    Social History   Socioeconomic History   Marital status: Single    Spouse name: Not on file   Number of children: Not on file   Years of education: Not on file   Highest education level: Not on file  Occupational History   Not on file  Tobacco Use   Smoking status: Every Day    Types: Cigarettes   Smokeless tobacco: Never  Vaping Use   Vaping Use: Not on file  Substance and Sexual Activity   Alcohol use: Never   Drug use: Not Currently   Sexual activity: Yes  Other Topics Concern   Not on file  Social History Narrative   Not on file    Social Determinants of Health   Financial Resource Strain: Not on file  Food Insecurity: Not on file  Transportation Needs: Not on file  Physical Activity: Not on file  Stress: Not on file  Social Connections: Not on file   Additional Social History:   Sleep: Fair  Appetite:  Fair  Current Medications: Current Facility-Administered Medications  Medication Dose Route Frequency Provider Last Rate Last Admin   acetaminophen (TYLENOL) tablet 650 mg  650 mg Oral Q6H PRN [name] F, NP   650 mg at 03/16/22 0840   alum & mag hydroxide-simeth (MAALOX/MYLANTA) 200-200-20 MG/5ML suspension 30 mL  30 mL Oral Q4H PRN [name] F, NP       benztropine (COGENTIN) tablet 0.5 mg  0.5 mg Oral Daily Shaune Pollack, Jaycelyn Orrison Y, NP   0.5 mg at 03/22/22 0831   fluconazole (DIFLUCAN) tablet 200 mg  200 mg Oral Daily [name], RPH   200 mg at 03/22/22 0831   haloperidol (HALDOL) tablet 5 mg  5 mg Oral Daily [name], NP   5 mg at 03/22/22 0831   hydrOXYzine (ATARAX) tablet 25 mg  25 mg Oral TID PRN [name], NP   25 mg at 03/20/22 2127   LORazepam (ATIVAN) tablet 2 mg  2 mg Oral Q6H PRN [name], [name], MD   2 mg at 03/19/22 2117   Or  LORazepam (ATIVAN) injection 2 mg  2 mg Intramuscular Q6H PRN [name], [name], MD       magnesium hydroxide (MILK OF MAGNESIA) suspension 30 mL  30 mL Oral Daily PRN [name] F, NP   30 mL at 03/15/22 0811   metoprolol succinate (TOPROL-XL) 24 hr tablet 25 mg  25 mg Oral Daily [name], NP   25 mg at 03/22/22 0831   NIFEdipine (PROCARDIA-XL/NIFEDICAL-XL) 24 hr tablet 90 mg  90 mg Oral Daily [name], [name], MD   90 mg at 03/22/22 0831   OLANZapine zydis (ZYPREXA) disintegrating tablet 10 mg  10 mg Oral QHS [name], NP        Lab Results: No results found for this or any previous visit (from the past 48 hour(s)).  Blood Alcohol level:  Lab Results  Component Value Date   ETH <10 03/12/2022   ETH <10 01/13/2021     Metabolic Disorder Labs: Lab Results  Component Value Date   HGBA1C 5.0 03/14/2022   MPG 97 03/14/2022   MPG 91 06/27/2020   No results found for: "PROLACTIN" Lab Results  Component Value Date   CHOL 143 03/14/2022   TRIG 40 03/14/2022   HDL 61 03/14/2022   CHOLHDL 2.3 03/14/2022   VLDL 8 03/14/2022   LDLCALC 74 03/14/2022   LDLCALC 82 06/27/2020    Musculoskeletal: Strength & Muscle Tone: within normal limits Gait & Station: normal Patient leans: N/A Psychiatric Specialty Exam: Physical Exam Vitals and nursing note reviewed.  Constitutional:      Appearance: Normal appearance.  HENT:     Head: Normocephalic.     Nose: Nose normal.  Pulmonary:     Effort: Pulmonary effort is normal.  Musculoskeletal:        General: Normal range of motion.     Cervical back: Normal range of motion.  Neurological:     General: No focal deficit present.     Mental Status: She is alert and oriented to person, place, and time. Mental status is at baseline.  Psychiatric:        Attention and Perception: She is inattentive. She perceives auditory and visual hallucinations.        Mood and Affect: Mood is anxious. Affect is blunt.        Speech: Speech is delayed.        Behavior: Behavior is slowed.        Thought Content: Thought content is paranoid.        Cognition and Memory: Cognition is impaired.        Judgment: Judgment is inappropriate.     Review of Systems  Constitutional: Negative.   HENT: Negative.    Eyes: Negative.   Respiratory: Negative.    Cardiovascular: Negative.   Gastrointestinal: Negative.   Musculoskeletal: Negative.   Skin: Negative.   Neurological: Negative.   Psychiatric/Behavioral:  Positive for depression, dysphoric mood and hallucinations. The patient is nervous/anxious.     Blood pressure 117/90, pulse 97, temperature 98.5 F (36.9 C), temperature source Oral, resp. rate 18, height 5\' 5"  (1.651 m), weight 72.6 kg, SpO2 96 %, unknown if  currently breastfeeding.Body mass index is 26.63 kg/m.  General Appearance: Casual  Eye Contact:  Good  Speech:  Slow  Volume:  Decreased  Mood:  Depressed and anxious  Affect:  Flat  Thought Process:  Coherent  Orientation:  Full (Time, Place, and Person)  Thought Content:  Hallucinations: Auditory, delusions  Suicidal  Thoughts:  No  Homicidal Thoughts:  No  Memory:  Immediate;   Fair Recent;   Fair Remote;   Fair  Judgement:  Fair  Insight:  Fair  Psychomotor Activity:  Decreased  Concentration:  Concentration: Fair and Attention Span: Fair  Recall:  [employer] of Knowledge:  Fair  Language:  Poor  Akathisia:  No  Handed:  Right  AIMS (if indicated):     Assets:  Leisure Time Physical Health Resilience  ADL's:  Intact  Cognition:  Impaired,  Mild  Sleep:        Physical Exam: Physical Exam Vitals and nursing note reviewed.  Constitutional:      Appearance: Normal appearance.  HENT:     Head: Normocephalic.     Nose: Nose normal.  Pulmonary:     Effort: Pulmonary effort is normal.  Musculoskeletal:        General: Normal range of motion.     Cervical back: Normal range of motion.  Neurological:     General: No focal deficit present.     Mental Status: She is alert and oriented to person, place, and time. Mental status is at baseline.  Psychiatric:        Attention and Perception: She is inattentive. She perceives auditory and visual hallucinations.        Mood and Affect: Mood is anxious. Affect is blunt.        Speech: Speech is delayed.        Behavior: Behavior is slowed.        Thought Content: Thought content is paranoid.        Cognition and Memory: Cognition is impaired.        Judgment: Judgment is inappropriate.    Review of Systems  Constitutional: Negative.   HENT: Negative.    Eyes: Negative.   Respiratory: Negative.    Cardiovascular: Negative.   Gastrointestinal: Negative.   Musculoskeletal: Negative.   Skin: Negative.    Neurological: Negative.   Psychiatric/Behavioral:  Positive for depression, dysphoric mood and hallucinations. The patient is nervous/anxious.    Blood pressure 117/90, pulse 97, temperature 98.5 F (36.9 C), temperature source Oral, resp. rate 18, height 5\' 5"  (1.651 m), weight 72.6 kg, SpO2 96 %, unknown if currently breastfeeding. Body mass index is 26.63 kg/m.   Treatment Plan Summary: Plan follow-up with continued medication management with antipsychotics.  No change to other medicine. Schizoaffective disorder, bipolar type Zyprexa 10 mg BID decreased to daily at bedtime Increased Haldol 5 mg daily to BID  EPS: Cogentin 0.5 mg daily increased to BID  Anxiety: Hydroxyzine 25 mg TID PRN  , NP 03/22/2022, 10:46 AM

## 2022-03-22 NOTE — Group Note (Signed)
[facility] LCSW Group Therapy Note   Group Date: 03/22/2022 Start Time: 1300 End Time: 1400   Type of Therapy/Topic:  Group Therapy:  Balance in Life  Participation Level:  Did Not Attend   Description of Group:    This group will address the concept of balance and how it feels and looks when one is unbalanced. Patients will be encouraged to process areas in their lives that are out of balance, and identify reasons for remaining unbalanced. Facilitators will guide patients utilizing problem- solving interventions to address and correct the stressor making their life unbalanced. Understanding and applying boundaries will be explored and addressed for obtaining  and maintaining a balanced life. Patients will be encouraged to explore ways to assertively make their unbalanced needs known to significant others in their lives, using other group members and facilitator for support and feedback.  Therapeutic Goals: Patient will identify two or more emotions or situations they have that consume much of in their lives. Patient will identify signs/triggers that life has become out of balance:  Patient will identify two ways to set boundaries in order to achieve balance in their lives:  Patient will demonstrate ability to communicate their needs through discussion and/or role plays  Summary of Patient Progress: X   Therapeutic Modalities:   Cognitive Behavioral Therapy Solution-Focused Therapy Assertiveness Training   Jonavan Vanhorn R Ajai Harville, LCSW 

## 2022-03-23 DIAGNOSIS — F250 Schizoaffective disorder, bipolar type: Secondary | ICD-10-CM | POA: Diagnosis not present

## 2022-03-23 NOTE — Plan of Care (Signed)
  Problem: Education: Goal: Knowledge of the prescribed therapeutic regimen will improve Outcome: Progressing   Problem: Health Behavior/Discharge Planning: Goal: Compliance with prescribed medication regimen will improve Outcome: Progressing   Problem: Safety: Goal: Ability to remain free from injury will improve Outcome: Progressing   Problem: Education: Goal: Knowledge of [facility] Education information/materials will improve Outcome: Progressing Goal: Verbalization of understanding the information provided will improve Outcome: Progressing   Problem: Safety: Goal: Periods of time without injury will increase Outcome: Progressing

## 2022-03-23 NOTE — Group Note (Addendum)
[facility] LCSW Group Therapy Note   Group Date: 03/23/2022 Start Time: 1300 End Time: 1400  Type of Therapy and Topic:  Group Therapy:  Feelings around Relapse and Recovery  Participation Level:  None    Description of Group:    Patients in this group will discuss emotions they experience before and after a relapse. They will process how experiencing these feelings, or avoidance of experiencing them, relates to having a relapse. Facilitator will guide patients to explore emotions they have related to recovery. Patients will be encouraged to process which emotions are more powerful. They will be guided to discuss the emotional reaction significant others in their lives may have to patients' relapse or recovery. Patients will be assisted in exploring ways to respond to the emotions of others without this contributing to a relapse.  Therapeutic Goals: Patient will identify two or more emotions that lead to relapse for them:  Patient will identify two emotions that result when they relapse:  Patient will identify two emotions related to recovery:  Patient will demonstrate ability to communicate their needs through discussion and/or role plays.   Summary of Patient Progress: Group not held due to acuity on the unit.   Therapeutic Modalities:   Cognitive Behavioral Therapy Solution-Focused Therapy Assertiveness Training Relapse Prevention Therapy   Shahmeer Bunn R Aamilah Augenstein, LCSW 

## 2022-03-23 NOTE — Plan of Care (Signed)

## 2022-03-23 NOTE — Progress Notes (Signed)
Recreation Therapy Notes     Date: 03/23/2022   Time: 11:00 am      Location: Craft room      Behavioral response: N/A   Intervention Topic: Decision Making    Discussion/Intervention: Patient refused to attend group.    Clinical Observations/Feedback:  Patient refused to attend group.    Harvel Meskill LRT/CTRS        Osman Calzadilla 03/23/2022 11:23 AM

## 2022-03-23 NOTE — Progress Notes (Signed)
[facility] MD Progress Note  03/23/2022 9:20 AM Alphonsine Minium  MRN:  [id number]  Subjective: Follow-up for client with schizoaffective disorder, bipolar type. She was pacing the halls last night, disorganized thinking, hallucinating.  Today, she has slept often.  Hesitant to take medications, appears paranoid.  Haldol was added two days ago to assist.  Principal Problem: Schizoaffective disorder, bipolar type (HCC) Diagnosis: Principal Problem:   Schizoaffective disorder, bipolar type (HCC)  Total Time spent with patient: 30 minutes  Past Psychiatric History: Multiple recurrent episodes of psychotic symptoms with mood symptoms often  Past Medical History:  Past Medical History:  Diagnosis Date   Anemia    Hypertension    Post partum depression    Pregnancy induced hypertension    Schizoaffective disorder (HCC)     Past Surgical History:  Procedure Laterality Date   NO PAST SURGERIES     Family History:  Family History  Problem Relation Age of Onset   Hypertension Sister    Hypertension Maternal Grandmother    Diabetes Maternal Grandfather    Hypertension Maternal Grandfather    Family Psychiatric  History: See previous Social History:  Social History   Substance and Sexual Activity  Alcohol Use Never     Social History   Substance and Sexual Activity  Drug Use Not Currently    Social History   Socioeconomic History   Marital status: Single    Spouse name: Not on file   Number of children: Not on file   Years of education: Not on file   Highest education level: Not on file  Occupational History   Not on file  Tobacco Use   Smoking status: Every Day    Types: Cigarettes   Smokeless tobacco: Never  Vaping Use   Vaping Use: Not on file  Substance and Sexual Activity   Alcohol use: Never   Drug use: Not Currently   Sexual activity: Yes  Other Topics Concern   Not on file  Social History Narrative   Not on file   Social Determinants of Health   Financial  Resource Strain: Not on file  Food Insecurity: Not on file  Transportation Needs: Not on file  Physical Activity: Not on file  Stress: Not on file  Social Connections: Not on file   Additional Social History:   Sleep: Fair  Appetite:  Fair  Current Medications: Current Facility-Administered Medications  Medication Dose Route Frequency Provider Last Rate Last Admin   acetaminophen (TYLENOL) tablet 650 mg  650 mg Oral Q6H PRN [name] F, NP   650 mg at 03/16/22 0840   alum & mag hydroxide-simeth (MAALOX/MYLANTA) 200-200-20 MG/5ML suspension 30 mL  30 mL Oral Q4H PRN [name] F, NP       benztropine (COGENTIN) tablet 0.5 mg  0.5 mg Oral BID [name], NP   0.5 mg at 03/23/22 0759   fluconazole (DIFLUCAN) tablet 200 mg  200 mg Oral Daily [name], RPH   200 mg at 03/23/22 0759   haloperidol (HALDOL) tablet 5 mg  5 mg Oral BID [name], NP   5 mg at 03/23/22 0800   hydrOXYzine (ATARAX) tablet 25 mg  25 mg Oral TID PRN [name], NP   25 mg at 03/20/22 2127   LORazepam (ATIVAN) tablet 2 mg  2 mg Oral Q6H PRN [name], [name], MD   2 mg at 03/19/22 2117   Or   LORazepam (ATIVAN) injection 2 mg  2  mg Intramuscular Q6H PRN [name], [name], MD       magnesium hydroxide (MILK OF MAGNESIA) suspension 30 mL  30 mL Oral Daily PRN [name] F, NP   30 mL at 03/15/22 0811   metoprolol succinate (TOPROL-XL) 24 hr tablet 25 mg  25 mg Oral Daily [name], NP   25 mg at 03/23/22 0800   NIFEdipine (PROCARDIA-XL/NIFEDICAL-XL) 24 hr tablet 90 mg  90 mg Oral Daily [name], [name], MD   90 mg at 03/23/22 0759   OLANZapine zydis (ZYPREXA) disintegrating tablet 10 mg  10 mg Oral QHS [name], NP   10 mg at 03/23/22 3875    Lab Results: No results found for this or any previous visit (from the past 48 hour(s)).  Blood Alcohol level:  Lab Results  Component Value Date   ETH <10 03/12/2022   ETH <10 01/13/2021    Metabolic Disorder  Labs: Lab Results  Component Value Date   HGBA1C 5.0 03/14/2022   MPG 97 03/14/2022   MPG 91 06/27/2020   No results found for: "PROLACTIN" Lab Results  Component Value Date   CHOL 143 03/14/2022   TRIG 40 03/14/2022   HDL 61 03/14/2022   CHOLHDL 2.3 03/14/2022   VLDL 8 03/14/2022   LDLCALC 74 03/14/2022   LDLCALC 82 06/27/2020    Musculoskeletal: Strength & Muscle Tone: within normal limits Gait & Station: normal Patient leans: N/A Psychiatric Specialty Exam: Physical Exam Vitals and nursing note reviewed.  Constitutional:      Appearance: Normal appearance.  HENT:     Head: Normocephalic.     Nose: Nose normal.  Pulmonary:     Effort: Pulmonary effort is normal.  Musculoskeletal:        General: Normal range of motion.     Cervical back: Normal range of motion.  Neurological:     General: No focal deficit present.     Mental Status: She is alert and oriented to person, place, and time. Mental status is at baseline.  Psychiatric:        Attention and Perception: She is inattentive. She perceives auditory and visual hallucinations.        Mood and Affect: Mood is anxious. Affect is blunt.        Speech: Speech is delayed.        Behavior: Behavior is slowed.        Thought Content: Thought content is paranoid.        Cognition and Memory: Cognition is impaired.        Judgment: Judgment is inappropriate.    Review of Systems  Constitutional: Negative.   HENT: Negative.    Eyes: Negative.   Respiratory: Negative.    Cardiovascular: Negative.   Gastrointestinal: Negative.   Musculoskeletal: Negative.   Skin: Negative.   Neurological: Negative.   Psychiatric/Behavioral:  Positive for depression, dysphoric mood and hallucinations. The patient is nervous/anxious.     Blood pressure 130/90, pulse 100, temperature 98.8 F (37.1 C), temperature source Oral, resp. rate 18, height 5\' 5"  (1.651 m), weight 72.6 kg, SpO2 98 %, unknown if currently breastfeeding.Body  mass index is 26.63 kg/m.  General Appearance: Casual  Eye Contact:  Good  Speech:  Slow  Volume:  Decreased  Mood:  Depressed and anxious  Affect:  Flat  Thought Process:  Coherent  Orientation:  Full (Time, Place, and Person)  Thought Content:  Hallucinations: Auditory, delusions  Suicidal Thoughts:  No  Homicidal  Thoughts:  No  Memory:  Immediate;   Fair Recent;   Fair Remote;   Fair  Judgement:  Fair  Insight:  Fair  Psychomotor Activity:  Decreased  Concentration:  Concentration: Fair and Attention Span: Fair  Recall:  [employer] of Knowledge:  Fair  Language:  Poor  Akathisia:  No  Handed:  Right  AIMS (if indicated):     Assets:  Leisure Time Physical Health Resilience  ADL's:  Intact  Cognition:  Impaired,  Mild  Sleep:        Physical Exam: Physical Exam Vitals and nursing note reviewed.  Constitutional:      Appearance: Normal appearance.  HENT:     Head: Normocephalic.     Nose: Nose normal.  Pulmonary:     Effort: Pulmonary effort is normal.  Musculoskeletal:        General: Normal range of motion.     Cervical back: Normal range of motion.  Neurological:     General: No focal deficit present.     Mental Status: She is alert and oriented to person, place, and time. Mental status is at baseline.  Psychiatric:        Attention and Perception: She is inattentive. She perceives auditory and visual hallucinations.        Mood and Affect: Mood is anxious. Affect is blunt.        Speech: Speech is delayed.        Behavior: Behavior is slowed.        Thought Content: Thought content is paranoid.        Cognition and Memory: Cognition is impaired.        Judgment: Judgment is inappropriate.   Review of Systems  Constitutional: Negative.   HENT: Negative.    Eyes: Negative.   Respiratory: Negative.    Cardiovascular: Negative.   Gastrointestinal: Negative.   Musculoskeletal: Negative.   Skin: Negative.   Neurological: Negative.    Psychiatric/Behavioral:  Positive for depression, dysphoric mood and hallucinations. The patient is nervous/anxious.    Blood pressure 130/90, pulse 100, temperature 98.8 F (37.1 C), temperature source Oral, resp. rate 18, height 5\' 5"  (1.651 m), weight 72.6 kg, SpO2 98 %, unknown if currently breastfeeding. Body mass index is 26.63 kg/m.   Treatment Plan Summary: Plan follow-up with continued medication management with antipsychotics.  No change to other medicine. Schizoaffective disorder, bipolar type Zyprexa 10 mg BID decreased to daily at bedtime Haldol 5 mg daily to BID  EPS: Cogentin 0.5 mg daily increased to BID  Anxiety: Hydroxyzine 25 mg TID PRN  Waylan Boga, NP 03/23/2022, 9:20 AM

## 2022-03-23 NOTE — Progress Notes (Signed)
D: Patient alert and oriented. Patient denies pain. Patient denies anxiety and depression. Patient denies SI/HI/AVH. Patient does go back and forth between wanting to take medication and not wanting to take it. Patient does understand that it is beneficial for her to take the scheduled medication but it has taken a few attempts for patient to take medication.  Patient refused dinner.   A: Scheduled medications administered to patient, per MD orders.  Support and encouragement provided to patient.  Q15 minute safety checks maintained.   R: Patient compliant with medication administration and treatment plan. No adverse drug reactions noted. Patient remains safe on the unit at this time.

## 2022-03-24 DIAGNOSIS — F250 Schizoaffective disorder, bipolar type: Secondary | ICD-10-CM | POA: Diagnosis not present

## 2022-03-24 NOTE — Progress Notes (Signed)
[facility] MD Progress Note  03/24/2022 12:13 PM Alyssa Mathis  MRN:  [id number] Subjective: Alyssa Mathis is seen on rounds.  She is pleasant and cooperative.  She denies any problems with her medication.  Nurses state that she is still responding to internal stimuli.  There is no evidence of EPS or TD.  She has been compliant with her medication but needs prompting.  Principal Problem: Schizoaffective disorder, bipolar type (HCC) Diagnosis: Principal Problem:   Schizoaffective disorder, bipolar type (HCC)  Total Time spent with patient: 15 minutes  Past Psychiatric History: Schizophrenia  Past Medical History:  Past Medical History:  Diagnosis Date   Anemia    Hypertension    Post partum depression    Pregnancy induced hypertension    Schizoaffective disorder (HCC)     Past Surgical History:  Procedure Laterality Date   NO PAST SURGERIES     Family History:  Family History  Problem Relation Age of Onset   Hypertension Sister    Hypertension Maternal Grandmother    Diabetes Maternal Grandfather    Hypertension Maternal Grandfather    Family Psychiatric  History: Unremarkable Social History:  Social History   Substance and Sexual Activity  Alcohol Use Never     Social History   Substance and Sexual Activity  Drug Use Not Currently    Social History   Socioeconomic History   Marital status: Single    Spouse name: Not on file   Number of children: Not on file   Years of education: Not on file   Highest education level: Not on file  Occupational History   Not on file  Tobacco Use   Smoking status: Every Day    Types: Cigarettes   Smokeless tobacco: Never  Vaping Use   Vaping Use: Not on file  Substance and Sexual Activity   Alcohol use: Never   Drug use: Not Currently   Sexual activity: Yes  Other Topics Concern   Not on file  Social History Narrative   Not on file   Social Determinants of Health   Financial Resource Strain: Not on file  Food Insecurity: Not on  file  Transportation Needs: Not on file  Physical Activity: Not on file  Stress: Not on file  Social Connections: Not on file   Additional Social History:                         Sleep: Good  Appetite:  Good  Current Medications: Current Facility-Administered Medications  Medication Dose Route Frequency Provider Last Rate Last Admin   acetaminophen (TYLENOL) tablet 650 mg  650 mg Oral Q6H PRN [name] F, NP   650 mg at 03/16/22 0840   alum & mag hydroxide-simeth (MAALOX/MYLANTA) 200-200-20 MG/5ML suspension 30 mL  30 mL Oral Q4H PRN [name] F, NP       benztropine (COGENTIN) tablet 0.5 mg  0.5 mg Oral BID [name], NP   0.5 mg at 03/24/22 0837   fluconazole (DIFLUCAN) tablet 200 mg  200 mg Oral Daily [name], RPH   200 mg at 03/24/22 6213   haloperidol (HALDOL) tablet 5 mg  5 mg Oral BID [name], NP   5 mg at 03/24/22 0865   hydrOXYzine (ATARAX) tablet 25 mg  25 mg Oral TID PRN [name], NP   25 mg at 03/20/22 2127   LORazepam (ATIVAN) tablet 2 mg  2 mg Oral Q6H PRN [name], [name]  T, MD   2 mg at 03/19/22 2117   Or   LORazepam (ATIVAN) injection 2 mg  2 mg Intramuscular Q6H PRN [name], [name], MD       magnesium hydroxide (MILK OF MAGNESIA) suspension 30 mL  30 mL Oral Daily PRN [name] F, NP   30 mL at 03/15/22 0811   metoprolol succinate (TOPROL-XL) 24 hr tablet 25 mg  25 mg Oral Daily [name], NP   25 mg at 03/24/22 0700   NIFEdipine (PROCARDIA-XL/NIFEDICAL-XL) 24 hr tablet 90 mg  90 mg Oral Daily [name], [name], MD   90 mg at 03/24/22 0700   OLANZapine zydis (ZYPREXA) disintegrating tablet 10 mg  10 mg Oral QHS [name], NP   10 mg at 03/23/22 5093    Lab Results: No results found for this or any previous visit (from the past 48 hour(s)).  Blood Alcohol level:  Lab Results  Component Value Date   ETH <10 03/12/2022   ETH <10 01/13/2021    Metabolic Disorder Labs: Lab Results  Component  Value Date   HGBA1C 5.0 03/14/2022   MPG 97 03/14/2022   MPG 91 06/27/2020   No results found for: "PROLACTIN" Lab Results  Component Value Date   CHOL 143 03/14/2022   TRIG 40 03/14/2022   HDL 61 03/14/2022   CHOLHDL 2.3 03/14/2022   VLDL 8 03/14/2022   LDLCALC 74 03/14/2022   LDLCALC 82 06/27/2020    Physical Findings: AIMS:  , ,  ,  ,    CIWA:    COWS:     Musculoskeletal: Strength & Muscle Tone: within normal limits Gait & Station: normal Patient leans: N/A  Psychiatric Specialty Exam:  Presentation  General Appearance: Appropriate for Environment  Eye Contact:Good  Speech:Blocked (appears improved from earlier)  Speech Volume:Normal  Handedness:No data recorded  Mood and Affect  Mood:Dysphoric  Affect:Blunt   Thought Process  Thought Processes:Disorganized  Descriptions of Associations:No data recorded Orientation:Full (Time, Place and Person)  Thought Content:Scattered  History of Schizophrenia/Schizoaffective disorder:Yes  Duration of Psychotic Symptoms:Greater than six months  Hallucinations:No data recorded Ideas of Reference:Delusions  Suicidal Thoughts:No data recorded Homicidal Thoughts:No data recorded  Sensorium  Memory:Immediate Poor  Judgment:Poor  Insight:Fair   Executive Functions  Concentration:Fair  Attention Span:Fair  Recall:Fair  Fund of Knowledge:Fair  Language:Fair   Psychomotor Activity  Psychomotor Activity:No data recorded  Assets  Assets:Resilience; Social Support; Housing; Intimacy; Financial Resources/Insurance   Sleep  Sleep:No data recorded   Physical Exam: Physical Exam Vitals and nursing note reviewed.  Constitutional:      Appearance: Normal appearance. She is normal weight.  Neurological:     General: No focal deficit present.     Mental Status: She is alert and oriented to person, place, and time.  Psychiatric:        Mood and Affect: Mood normal.        Behavior: Behavior  normal.    Review of Systems  Constitutional: Negative.   HENT: Negative.    Eyes: Negative.   Respiratory: Negative.    Cardiovascular: Negative.   Gastrointestinal: Negative.   Genitourinary: Negative.   Musculoskeletal: Negative.   Skin: Negative.   Neurological: Negative.   Endo/Heme/Allergies: Negative.   Psychiatric/Behavioral: Negative.     Blood pressure (!) 116/105, pulse (!) 109, temperature 98.8 F (37.1 C), temperature source Oral, resp. rate 18, height 5\' 5"  (1.651 m), weight 72.6 kg, SpO2 99 %, unknown if currently breastfeeding. Body  mass index is 26.63 kg/m.   Treatment Plan Summary: Daily contact with patient to assess and evaluate symptoms and progress in treatment, Medication management, and Plan continue current medications.  Sarina Ill, DO 03/24/2022, 12:13 PM

## 2022-03-24 NOTE — Plan of Care (Signed)
Has remained restless, pacing, confused, paranoid and preoccupied. Responding to internal stimuli. Frequently coming to the nurses station and saying "I know I did wrong to him I am sorry...". Appetite is poor. Taking medications as scheduled. Safety precautions reinforced.

## 2022-03-24 NOTE — Progress Notes (Signed)
Continues to experience increasing anxiety, restlessness, hallucinations and paranoia. Received Ativan 2 mg PO. Currently in bed and safety precautions reinforced.

## 2022-03-24 NOTE — Progress Notes (Signed)
Patient appears responding to internal stimuli , staring at staff inappropriately. Patient was frequently redirected, no distress  15 minutes safety checks maintained.

## 2022-03-24 NOTE — Group Note (Signed)
[facility] LCSW Group Therapy Note   Group Date: 03/24/2022 Start Time: 1300 End Time: 1400  Type of Therapy and Topic:  Group Therapy:  Feelings around Relapse and Recovery  Participation Level:  Minimal   Description of Group:    Patients in this group will discuss emotions they experience before and after a relapse. They will process how experiencing these feelings, or avoidance of experiencing them, relates to having a relapse. Facilitator will guide patients to explore emotions they have related to recovery. Patients will be encouraged to process which emotions are more powerful. They will be guided to discuss the emotional reaction significant others in their lives may have to patients' relapse or recovery. Patients will be assisted in exploring ways to respond to the emotions of others without this contributing to a relapse.  Therapeutic Goals: Patient will identify two or more emotions that lead to relapse for them:  Patient will identify two emotions that result when they relapse:  Patient will identify two emotions related to recovery:  Patient will demonstrate ability to communicate their needs through discussion and/or role plays.   Summary of Patient Progress:  Patient was present for the entirety of group session. Patient participated in opening and closing remarks. However, patient did not contribute at all to the topic of discussion despite encouraged participation.    Therapeutic Modalities:   Cognitive Behavioral Therapy Solution-Focused Therapy Assertiveness Training Relapse Prevention Therapy   Corky Crafts, [place]

## 2022-03-24 NOTE — Progress Notes (Signed)
Patient's thought process has been improving. Attended group and remained cooperative. Complaining that she has been trying to call her children but the father would not allow her to talk to them. Patient is pleasant and active in the milieu but has episodes of religiosity and paranoia. Had dinner and medications. No additional concerns.

## 2022-03-24 NOTE — BH IP Treatment Plan (Signed)
Interdisciplinary Treatment and Diagnostic Plan Update  03/24/2022 Time of Session: 0830 Alyssa Mathis MRN: [id number]  Principal Diagnosis: Schizoaffective disorder, bipolar type [facility])  Secondary Diagnoses: Principal Problem:   Schizoaffective disorder, bipolar type (HCC)   Current Medications:  Current Facility-Administered Medications  Medication Dose Route Frequency Provider Last Rate Last Admin   acetaminophen (TYLENOL) tablet 650 mg  650 mg Oral Q6H PRN [name], NP   650 mg at 03/16/22 0840   alum & mag hydroxide-simeth (MAALOX/MYLANTA) 200-200-20 MG/5ML suspension 30 mL  30 mL Oral Q4H PRN [name], NP       benztropine (COGENTIN) tablet 0.5 mg  0.5 mg Oral BID [name], NP   0.5 mg at 03/24/22 0837   fluconazole (DIFLUCAN) tablet 200 mg  200 mg Oral Daily [name], RPH   200 mg at 03/24/22 0998   haloperidol (HALDOL) tablet 5 mg  5 mg Oral BID [name], NP   5 mg at 03/24/22 3382   hydrOXYzine (ATARAX) tablet 25 mg  25 mg Oral TID PRN [name], NP   25 mg at 03/20/22 2127   LORazepam (ATIVAN) tablet 2 mg  2 mg Oral Q6H PRN [name], [name], MD   2 mg at 03/24/22 1304   Or   LORazepam (ATIVAN) injection 2 mg  2 mg Intramuscular Q6H PRN [name], [name], MD       magnesium hydroxide (MILK OF MAGNESIA) suspension 30 mL  30 mL Oral Daily PRN [name], NP   30 mL at 03/15/22 0811   metoprolol succinate (TOPROL-XL) 24 hr tablet 25 mg  25 mg Oral Daily [name], NP   25 mg at 03/24/22 0700   NIFEdipine (PROCARDIA-XL/NIFEDICAL-XL) 24 hr tablet 90 mg  90 mg Oral Daily [name], [name], MD   90 mg at 03/24/22 0700   OLANZapine zydis (ZYPREXA) disintegrating tablet 10 mg  10 mg Oral QHS [name], NP   10 mg at 03/23/22 5053   PTA Medications: Medications Prior to Admission  Medication Sig Dispense Refill Last Dose   acetaminophen (TYLENOL) 325 MG tablet Take 2 tablets (650 mg total) by mouth every 4 (four) hours as  needed (for pain scale < 4).   prn   NIFEdipine (PROCARDIA XL/NIFEDICAL XL) 60 MG 24 hr tablet Take 1 tablet (60 mg total) by mouth daily. (Patient not taking: Reported on 03/13/2022) 30 tablet 5 Not Taking   OLANZapine (ZYPREXA) 20 MG tablet Take 20 mg by mouth at bedtime.      OLANZapine (ZYPREXA) 5 MG tablet Take 1 tablet (5 mg total) by mouth at bedtime. 30 tablet 5     Patient Stressors: Medication change or noncompliance    Patient Strengths: Motivation for treatment/growth   Treatment Modalities: Medication Management, Group therapy, Case management,  1 to 1 session with clinician, Psychoeducation, Recreational therapy.   Physician Treatment Plan for Primary Diagnosis: Schizoaffective disorder, bipolar type (HCC) Long Term Goal(s): Improvement in symptoms so as ready for discharge   Short Term Goals: Compliance with prescribed medications will improve Ability to verbalize feelings will improve Ability to demonstrate self-control will improve  Medication Management: Evaluate patient's response, side effects, and tolerance of medication regimen.  Therapeutic Interventions: 1 to 1 sessions, Unit Group sessions and Medication administration.  Evaluation of Outcomes: Progressing  Physician Treatment Plan for Secondary Diagnosis: Principal Problem:   Schizoaffective disorder, bipolar type (HCC)  Long Term Goal(s): Improvement in symptoms  so as ready for discharge   Short Term Goals: Compliance with prescribed medications will improve Ability to verbalize feelings will improve Ability to demonstrate self-control will improve     Medication Management: Evaluate patient's response, side effects, and tolerance of medication regimen.  Therapeutic Interventions: 1 to 1 sessions, Unit Group sessions and Medication administration.  Evaluation of Outcomes: Progressing   RN Treatment Plan for Primary Diagnosis: Schizoaffective disorder, bipolar type (HCC) Long Term Goal(s):  Knowledge of disease and therapeutic regimen to maintain health will improve  Short Term Goals: Ability to remain free from injury will improve, Ability to verbalize frustration and anger appropriately will improve, Ability to demonstrate self-control, Ability to participate in decision making will improve, Ability to verbalize feelings will improve, Ability to disclose and discuss suicidal ideas, and Compliance with prescribed medications will improve  Medication Management: RN will administer medications as ordered by provider, will assess and evaluate patient's response and provide education to patient for prescribed medication. RN will report any adverse and/or side effects to prescribing provider.  Therapeutic Interventions: 1 on 1 counseling sessions, Psychoeducation, Medication administration, Evaluate responses to treatment, Monitor vital signs and CBGs as ordered, Perform/monitor CIWA, COWS, AIMS and Fall Risk screenings as ordered, Perform wound care treatments as ordered.  Evaluation of Outcomes: Progressing   LCSW Treatment Plan for Primary Diagnosis: Schizoaffective disorder, bipolar type (HCC) Long Term Goal(s): Safe transition to appropriate next level of care at discharge, Engage patient in therapeutic group addressing interpersonal concerns.  Short Term Goals: Engage patient in aftercare planning with referrals and resources, Increase social support, Increase ability to appropriately verbalize feelings, Increase emotional regulation, Facilitate acceptance of mental health diagnosis and concerns, Facilitate patient progression through stages of change regarding substance use diagnoses and concerns, Identify triggers associated with mental health/substance abuse issues, and Increase skills for wellness and recovery  Therapeutic Interventions: Assess for all discharge needs, 1 to 1 time with Social worker, Explore available resources and support systems, Assess for adequacy in community  support network, Educate family and significant other(s) on suicide prevention, Complete Psychosocial Assessment, Interpersonal group therapy.  Evaluation of Outcomes: Progressing   Progress in Treatment: Attending groups: Yes. Participating in groups: No. Taking medication as prescribed: Yes. Toleration medication: Yes. Family/Significant other contact made: Yes, individual(s) contacted:  SPE completed with [name], partner  Patient understands diagnosis: No. Discussing patient identified problems/goals with staff: Yes. Medical problems stabilized or resolved: Yes. Denies suicidal/homicidal ideation: Yes. Issues/concerns per patient self-inventory: Yes. Other: none   New problem(s) identified: No, Describe:  none  New Short Term/Long Term Goal(s): Patient to work towards detox, elimination of symptoms of psychosis, medication management for mood stabilization; elimination of SI thoughts; development of comprehensive mental wellness/sobriety plan.  Patient Goals:  No additional goals identified at this time. Patient to continue to work towards original goals identified in initial treatment team meeting. CSW will remain available to patient should they voice additional treatment goals.   Discharge Plan or Barriers: No psychosocial barriers identified at this time, patient to return to place of residence when appropriate for discharge.   Reason for Continuation of Hospitalization: Other; describe psychosis   Estimated Length of Stay: 1-7 days     Scribe for Treatment Team: Almedia Balls 03/24/2022 4:17 PM

## 2022-03-25 DIAGNOSIS — F250 Schizoaffective disorder, bipolar type: Secondary | ICD-10-CM | POA: Diagnosis not present

## 2022-03-25 MED ORDER — NICOTINE POLACRILEX 2 MG MT GUM
2.0000 mg | CHEWING_GUM | OROMUCOSAL | Status: DC | PRN
  Administered 2022-03-25 – 2022-04-02 (×7): 2 mg via ORAL
  Filled 2022-03-25 (×8): qty 1

## 2022-03-25 NOTE — Progress Notes (Signed)
D: Patient alert and oriented. Patient denies pain.Patient endorses low anxiety at time of assessment. Patient denies depression. Patient denies SI/HI/VH. Patient endorses auditory hallucinations stating that she is hearing the devil.   Patient frequently at nurses station throughout shift with questions about discharge. Patient paces around the unit. PRN hydroxyzine requested by patient stating she is getting anxious thinking about discharging.   A: Scheduled medications administered to patient, per MD orders. Support and encouragement provided to patient.  Q15 minute safety checks maintained.   R: Patient compliant with medication administration and treatment plan. No adverse drug reactions noted. Patient remains safe on the unit at this time.

## 2022-03-25 NOTE — Progress Notes (Signed)
Patient appears responding to internal stimuli , staring at staff inappropriately. Patient was frequently redirected, no distress  15 minutes safety checks maintained.  

## 2022-03-25 NOTE — Progress Notes (Signed)
[facility] MD Progress Note  03/25/2022 11:36 AM Alyssa Mathis  MRN:  [id number] Subjective: Alyssa Mathis has been compliant with her medications.  She denies any side effects from her medications.  No evidence of EPS or TD.  Nurses report no issues with her today.  She has been pleasant and cooperative.  Principal Problem: Schizoaffective disorder, bipolar type (HCC) Diagnosis: Principal Problem:   Schizoaffective disorder, bipolar type (HCC)  Total Time spent with patient: 15 minutes  Past Psychiatric History: Schizophrenia  Past Medical History:  Past Medical History:  Diagnosis Date   Anemia    Hypertension    Post partum depression    Pregnancy induced hypertension    Schizoaffective disorder (HCC)     Past Surgical History:  Procedure Laterality Date   NO PAST SURGERIES     Family History:  Family History  Problem Relation Age of Onset   Hypertension Sister    Hypertension Maternal Grandmother    Diabetes Maternal Grandfather    Hypertension Maternal Grandfather    Family Psychiatric  History: Unremarkable Social History:  Social History   Substance and Sexual Activity  Alcohol Use Never     Social History   Substance and Sexual Activity  Drug Use Not Currently    Social History   Socioeconomic History   Marital status: Single    Spouse name: Not on file   Number of children: Not on file   Years of education: Not on file   Highest education level: Not on file  Occupational History   Not on file  Tobacco Use   Smoking status: Every Day    Types: Cigarettes   Smokeless tobacco: Never  Vaping Use   Vaping Use: Not on file  Substance and Sexual Activity   Alcohol use: Never   Drug use: Not Currently   Sexual activity: Yes  Other Topics Concern   Not on file  Social History Narrative   Not on file   Social Determinants of Health   Financial Resource Strain: Not on file  Food Insecurity: Not on file  Transportation Needs: Not on file  Physical Activity:  Not on file  Stress: Not on file  Social Connections: Not on file   Additional Social History:                         Sleep: Good  Appetite:  Good  Current Medications: Current Facility-Administered Medications  Medication Dose Route Frequency Provider Last Rate Last Admin   acetaminophen (TYLENOL) tablet 650 mg  650 mg Oral Q6H PRN [name] F, NP   650 mg at 03/16/22 0840   alum & mag hydroxide-simeth (MAALOX/MYLANTA) 200-200-20 MG/5ML suspension 30 mL  30 mL Oral Q4H PRN [name] F, NP       benztropine (COGENTIN) tablet 0.5 mg  0.5 mg Oral BID [name], NP   0.5 mg at 03/25/22 0825   fluconazole (DIFLUCAN) tablet 200 mg  200 mg Oral Daily [name], RPH   200 mg at 03/25/22 0825   haloperidol (HALDOL) tablet 5 mg  5 mg Oral BID [name], NP   5 mg at 03/25/22 0354   hydrOXYzine (ATARAX) tablet 25 mg  25 mg Oral TID PRN [name], NP   25 mg at 03/20/22 2127   LORazepam (ATIVAN) tablet 2 mg  2 mg Oral Q6H PRN [name], [name], MD   2 mg at 03/24/22 1304   Or  LORazepam (ATIVAN) injection 2 mg  2 mg Intramuscular Q6H PRN [name], [name], MD       magnesium hydroxide (MILK OF MAGNESIA) suspension 30 mL  30 mL Oral Daily PRN Waldon Merl F, NP   30 mL at 03/15/22 0811   metoprolol succinate (TOPROL-XL) 24 hr tablet 25 mg  25 mg Oral Daily Patrecia Pour, NP   25 mg at 03/25/22 0825   NIFEdipine (PROCARDIA-XL/NIFEDICAL-XL) 24 hr tablet 90 mg  90 mg Oral Daily [name], Madie Reno, MD   90 mg at 03/25/22 0825   OLANZapine zydis (ZYPREXA) disintegrating tablet 10 mg  10 mg Oral QHS Patrecia Pour, NP   10 mg at 03/23/22 [id number]    Lab Results: No results found for this or any previous visit (from the past 48 hour(s)).  Blood Alcohol level:  Lab Results  Component Value Date   ETH <10 03/12/2022   ETH <10 [id number]    Metabolic Disorder Labs: Lab Results  Component Value Date   HGBA1C 5.0 03/14/2022   MPG 97 03/14/2022    MPG 91 06/27/2020   No results found for: "PROLACTIN" Lab Results  Component Value Date   CHOL 143 03/14/2022   TRIG 40 03/14/2022   HDL 61 03/14/2022   CHOLHDL 2.3 03/14/2022   VLDL 8 03/14/2022   LDLCALC 74 03/14/2022   LDLCALC 82 06/27/2020    Physical Findings: AIMS:  , ,  ,  ,    CIWA:    COWS:     Musculoskeletal: Strength & Muscle Tone: within normal limits Gait & Station: normal Patient leans: N/A  Psychiatric Specialty Exam:  Presentation  General Appearance: Appropriate for Environment  Eye Contact:Good  Speech:Blocked (appears improved from earlier)  Speech Volume:Normal  Handedness:No data recorded  Mood and Affect  Mood:Dysphoric  Affect:Blunt   Thought Process  Thought Processes:Disorganized  Descriptions of Associations:No data recorded Orientation:Full (Time, Place and Person)  Thought Content:Scattered  History of Schizophrenia/Schizoaffective disorder:Yes  Duration of Psychotic Symptoms:Greater than six months  Hallucinations:No data recorded Ideas of Reference:Delusions  Suicidal Thoughts:No data recorded Homicidal Thoughts:No data recorded  Sensorium  Memory:Immediate Poor  Judgment:Poor  Insight:Fair   Executive Functions  Concentration:Fair  Attention Span:Fair  [place]   Psychomotor Activity  Psychomotor Activity:No data recorded  Assets  Assets:Resilience; Social Support; Housing; Intimacy; Financial Resources/Insurance   Sleep  Sleep:No data recorded   Physical Exam: Physical Exam Vitals and nursing note reviewed.  Constitutional:      Appearance: Normal appearance. She is normal weight.  Neurological:     General: No focal deficit present.     Mental Status: She is alert and oriented to person, place, and time.  Psychiatric:        Mood and Affect: Mood normal.        Behavior: Behavior normal.    Review of Systems  Constitutional: Negative.    HENT: Negative.    Eyes: Negative.   Respiratory: Negative.    Cardiovascular: Negative.   Gastrointestinal: Negative.   Genitourinary: Negative.   Musculoskeletal: Negative.   Skin: Negative.   Neurological: Negative.   Endo/Heme/Allergies: Negative.   Psychiatric/Behavioral: Negative.     Blood pressure 120/89, pulse (!) 102, temperature 98.6 F (37 C), temperature source Oral, resp. rate 18, height 5\' 5"  (1.651 m), weight 72.6 kg, SpO2 100 %, unknown if currently breastfeeding. Body mass index is 26.63 kg/m.   Treatment Plan Summary: Daily contact with patient to  assess and evaluate symptoms and progress in treatment, Medication management, and Plan continue current medications.  Sarina Ill, DO 03/25/2022, 11:36 AM

## 2022-03-25 NOTE — Plan of Care (Signed)
  Problem: Education: Goal: Knowledge of [facility] Education information/materials will improve Outcome: Progressing Goal: Verbalization of understanding the information provided will improve Outcome: Progressing   Problem: Activity: Goal: Interest or engagement in activities will improve Outcome: Progressing   Problem: Health Behavior/Discharge Planning: Goal: Compliance with treatment plan for underlying cause of condition will improve Outcome: Progressing   Problem: Safety: Goal: Periods of time without injury will increase Outcome: Progressing   

## 2022-03-26 DIAGNOSIS — F250 Schizoaffective disorder, bipolar type: Secondary | ICD-10-CM | POA: Diagnosis not present

## 2022-03-26 MED ORDER — OLANZAPINE 5 MG PO TBDP
15.0000 mg | ORAL_TABLET | Freq: Every day | ORAL | Status: DC
  Administered 2022-03-26 – 2022-04-02 (×8): 15 mg via ORAL
  Filled 2022-03-26 (×8): qty 1

## 2022-03-26 MED ORDER — LITHIUM CARBONATE ER 300 MG PO TBCR
300.0000 mg | EXTENDED_RELEASE_TABLET | Freq: Two times a day (BID) | ORAL | Status: DC
  Administered 2022-03-26 – 2022-03-30 (×8): 300 mg via ORAL
  Filled 2022-03-26 (×8): qty 1

## 2022-03-26 MED ORDER — OLANZAPINE 5 MG PO TABS: 5.0000 mg | ORAL_TABLET | Freq: Four times a day (QID) | ORAL | Status: DC | PRN

## 2022-03-26 NOTE — Group Note (Signed)
[facility] LCSW Group Therapy Note    Group Date: 03/26/2022 Start Time: 1400 End Time: 1500  Type of Therapy and Topic:  Group Therapy:  Overcoming Obstacles  Participation Level:  [facility] PARTICIPATION LEVEL: Did Not Attend   Description of Group:   In this group patients will be encouraged to explore what they see as obstacles to their own wellness and recovery. They will be guided to discuss their thoughts, feelings, and behaviors related to these obstacles. The group will process together ways to cope with barriers, with attention given to specific choices patients can make. Each patient will be challenged to identify changes they are motivated to make in order to overcome their obstacles. This group will be process-oriented, with patients participating in exploration of their own experiences as well as giving and receiving support and challenge from other group members.  Therapeutic Goals: 1. Patient will identify personal and current obstacles as they relate to admission. 2. Patient will identify barriers that currently interfere with their wellness or overcoming obstacles.  3. Patient will identify feelings, thought process and behaviors related to these barriers. 4. Patient will identify two changes they are willing to make to overcome these obstacles:    Summary of Patient Progress X   Therapeutic Modalities:   Cognitive Behavioral Therapy Solution Focused Therapy Motivational Interviewing Relapse Prevention Therapy   Ladashia Demarinis R Christl Fessenden, LCSW 

## 2022-03-26 NOTE — Progress Notes (Signed)
[facility] MD Progress Note  03/26/2022 1:52 PM Alyssa Mathis  MRN:  [id number] Subjective: Follow-up for 36 year old woman with schizoaffective disorder.  Patient seen today.  She remains psychotic and was complaining to nursing staff today of "negative thoughts".  She and I sat down and she complained of having intrusive negative thoughts.  I gently clarified that these are probably more like hallucinations as she can hear them at times and they are coming from outside of her.  She denied any thought of hurting herself or anyone else but said that they were bothering her and upsetting and making her feel bad.  Patient remains very slow with a blank almost frightened looking affect.  Very withdrawn.  Does not however appear to be stiff or having any akathisia. Principal Problem: Schizoaffective disorder, bipolar type (HCC) Diagnosis: Principal Problem:   Schizoaffective disorder, bipolar type (HCC)  Total Time spent with patient: 30 minutes  Past Psychiatric History: Past history of schizoaffective disorder  Past Medical History:  Past Medical History:  Diagnosis Date   Anemia    Hypertension    Post partum depression    Pregnancy induced hypertension    Schizoaffective disorder (HCC)     Past Surgical History:  Procedure Laterality Date   NO PAST SURGERIES     Family History:  Family History  Problem Relation Age of Onset   Hypertension Sister    Hypertension Maternal Grandmother    Diabetes Maternal Grandfather    Hypertension Maternal Grandfather    Family Psychiatric  History: See previous Social History:  Social History   Substance and Sexual Activity  Alcohol Use Never     Social History   Substance and Sexual Activity  Drug Use Not Currently    Social History   Socioeconomic History   Marital status: Single    Spouse name: Not on file   Number of children: Not on file   Years of education: Not on file   Highest education level: Not on file  Occupational History    Not on file  Tobacco Use   Smoking status: Every Day    Types: Cigarettes   Smokeless tobacco: Never  Vaping Use   Vaping Use: Not on file  Substance and Sexual Activity   Alcohol use: Never   Drug use: Not Currently   Sexual activity: Yes  Other Topics Concern   Not on file  Social History Narrative   Not on file   Social Determinants of Health   Financial Resource Strain: Not on file  Food Insecurity: Not on file  Transportation Needs: Not on file  Physical Activity: Not on file  Stress: Not on file  Social Connections: Not on file   Additional Social History:                         Sleep: Fair  Appetite:  Fair  Current Medications: Current Facility-Administered Medications  Medication Dose Route Frequency Provider Last Rate Last Admin   acetaminophen (TYLENOL) tablet 650 mg  650 mg Oral Q6H PRN [name] F, NP   650 mg at 03/16/22 0840   alum & mag hydroxide-simeth (MAALOX/MYLANTA) 200-200-20 MG/5ML suspension 30 mL  30 mL Oral Q4H PRN [name] F, NP       benztropine (COGENTIN) tablet 0.5 mg  0.5 mg Oral BID [name], NP   0.5 mg at 03/26/22 0828   fluconazole (DIFLUCAN) tablet 200 mg  200 mg Oral Daily Princella Ion  D, RPH   200 mg at 03/26/22 6295   haloperidol (HALDOL) tablet 5 mg  5 mg Oral BID [name], NP   5 mg at 03/26/22 2841   hydrOXYzine (ATARAX) tablet 25 mg  25 mg Oral TID PRN [name], NP   25 mg at 03/26/22 1140   lithium carbonate (LITHOBID) CR tablet 300 mg  300 mg Oral Q12H Kamren Heintzelman T, MD   300 mg at 03/26/22 1234   LORazepam (ATIVAN) tablet 2 mg  2 mg Oral Q6H PRN Payal Stanforth T, MD   2 mg at 03/24/22 1304   Or   LORazepam (ATIVAN) injection 2 mg  2 mg Intramuscular Q6H PRN Vicki Pasqual T, MD       magnesium hydroxide (MILK OF MAGNESIA) suspension 30 mL  30 mL Oral Daily PRN [name] F, NP   30 mL at 03/15/22 0811   metoprolol succinate (TOPROL-XL) 24 hr tablet 25 mg  25 mg Oral  Daily [name], NP   25 mg at 03/26/22 3244   nicotine polacrilex (NICORETTE) gum 2 mg  2 mg Oral PRN Sarina Ill, DO   2 mg at 03/25/22 1722   NIFEdipine (PROCARDIA-XL/NIFEDICAL-XL) 24 hr tablet 90 mg  90 mg Oral Daily Leslye Puccini T, MD   90 mg at 03/26/22 0828   OLANZapine (ZYPREXA) tablet 5 mg  5 mg Oral Q6H PRN Min Tunnell, [name], MD       OLANZapine zydis (ZYPREXA) disintegrating tablet 15 mg  15 mg Oral QHS Kadin Canipe T, MD        Lab Results: No results found for this or any previous visit (from the past 48 hour(s)).  Blood Alcohol level:  Lab Results  Component Value Date   ETH <10 03/12/2022   ETH <10 01/13/2021    Metabolic Disorder Labs: Lab Results  Component Value Date   HGBA1C 5.0 03/14/2022   MPG 97 03/14/2022   MPG 91 06/27/2020   No results found for: "PROLACTIN" Lab Results  Component Value Date   CHOL 143 03/14/2022   TRIG 40 03/14/2022   HDL 61 03/14/2022   CHOLHDL 2.3 03/14/2022   VLDL 8 03/14/2022   LDLCALC 74 03/14/2022   LDLCALC 82 06/27/2020    Physical Findings: AIMS:  , ,  ,  ,    CIWA:    COWS:     Musculoskeletal: Strength & Muscle Tone: within normal limits Gait & Station: normal Patient leans: N/A  Psychiatric Specialty Exam:  Presentation  General Appearance: Appropriate for Environment  Eye Contact:Good  Speech:Blocked (appears improved from earlier)  Speech Volume:Normal  Handedness:No data recorded  Mood and Affect  Mood:Dysphoric  Affect:Blunt   Thought Process  Thought Processes:Disorganized  Descriptions of Associations:No data recorded Orientation:Full (Time, Place and Person)  Thought Content:Scattered  History of Schizophrenia/Schizoaffective disorder:Yes  Duration of Psychotic Symptoms:Greater than six months  Hallucinations:No data recorded Ideas of Reference:Delusions  Suicidal Thoughts:No data recorded Homicidal Thoughts:No data recorded  Sensorium  Memory:Immediate  Poor  Judgment:Poor  Insight:Fair   Executive Functions  Concentration:Fair  Attention Span:Fair  Recall:Fair  Fund of Knowledge:Fair  Language:Fair   Psychomotor Activity  Psychomotor Activity:No data recorded  Assets  Assets:Resilience; Social Support; Housing; Intimacy; Financial Resources/Insurance   Sleep  Sleep:No data recorded   Physical Exam: Physical Exam Vitals and nursing note reviewed.  Constitutional:      Appearance: Normal appearance.  HENT:     Head: Normocephalic and atraumatic.  Mouth/Throat:     Pharynx: Oropharynx is clear.  Eyes:     Pupils: Pupils are equal, round, and reactive to light.  Cardiovascular:     Rate and Rhythm: Normal rate and regular rhythm.  Pulmonary:     Effort: Pulmonary effort is normal.     Breath sounds: Normal breath sounds.  Abdominal:     General: Abdomen is flat.     Palpations: Abdomen is soft.  Musculoskeletal:        General: Normal range of motion.  Skin:    General: Skin is warm and dry.  Neurological:     General: No focal deficit present.     Mental Status: She is alert. Mental status is at baseline.  Psychiatric:        Attention and Perception: She is inattentive. She perceives auditory hallucinations.        Mood and Affect: Mood is depressed. Affect is blunt.        Speech: Speech is delayed.        Behavior: Behavior is slowed and withdrawn.        Thought Content: Thought content is paranoid. Thought content does not include suicidal ideation.        Cognition and Memory: Cognition is impaired.    Review of Systems  Constitutional: Negative.   HENT: Negative.    Eyes: Negative.   Respiratory: Negative.    Cardiovascular: Negative.   Gastrointestinal: Negative.   Musculoskeletal: Negative.   Skin: Negative.   Neurological: Negative.   Psychiatric/Behavioral:  Positive for depression and hallucinations. Negative for substance abuse and suicidal ideas. The patient is  nervous/anxious.    Blood pressure 119/86, pulse 93, temperature 98.6 F (37 C), temperature source Oral, resp. rate 18, height 5\' 5"  (1.651 m), weight 72.6 kg, SpO2 100 %, unknown if currently breastfeeding. Body mass index is 26.63 kg/m.   Treatment Plan Summary: Medication management and in the course of this hospitalization she seems to have gone from manic to depressive.  Talked with her about how I think that the history would strongly support her having a bipolar type illness whether straight bipolar or schizoaffective.  Reviewed medications.  I suggested that adding a typical mood stabilizer to her antipsychotics would probably be good since the olanzapine alone was not working well enough.  Suggested lithium and reviewed side effects risks and benefits.  Patient agreeable to trial of lithium starting low-dose 300 mg twice a day.  Increase nighttime olanzapine to 15 mg and added a as needed.  Not sure at this point if she needs to antipsychotics we will review that if she starts to show some improvement with mood stabilizers.  Alethia Berthold, MD 03/26/2022, 1:52 PM

## 2022-03-26 NOTE — [facility] Group Notes (Signed)
[facility] Group Notes:  (Nursing/MHT/Case Management/Adjunct)  Date:  03/26/2022  Time:  10:17 AM  Type of Therapy:   community meeting  Participation Level:  Active  Participation Quality:  Appropriate, Attentive, and Supportive  Affect:  Appropriate  Cognitive:  Appropriate  Insight:  Appropriate  Engagement in Group:  Engaged and Supportive  Modes of Intervention:  Discussion, Education, and Support  Summary of Progress/Problems:  Lynelle Smoke Ariyel Jeangilles 03/26/2022, 10:17 AM

## 2022-03-26 NOTE — Progress Notes (Signed)
Alyssa Mathis was at the nurses station promptly at 2100 to get her medication for the night. Inquired if any changes had occurred from her previous med pass. She is med compliant and received her medication without incident.  She is pleasant and cooperative. She is with clear, logical thoughts as she discusses her  medication and the conversation that she had with the doctor earlier. She denies si/hi/avh.  Says her depression is situational and stems from wanting to go home, as she misses her children. She rates her depression 2/10. She also reports having anxiety and rated 3/10 does not want any other medication at this time.Will continue to monitor with q15  minute safety checks.     C Butler-Nicholson, LPN

## 2022-03-26 NOTE — Progress Notes (Signed)
D: Patient alert and oriented. Patient denies pain. Patient denies anxiety and depression. Patient denies SI/HI/VH. Patient endorses auditory hallucinations stating she is "hearing voices."  Patient became tearful around lunchtime stating she is hearing voices. Patient states she is worried because she doesn't know who to trust on the unit and doesn't know who to trust with her children outside of here.  PRN zyprexa was provided to patient for hallucinations.   A: Scheduled medications administered to patient, per MD orders.  Support and encouragement provided to patient.  Q15 minute safety checks maintained.   R: Patient compliant with medication administration and treatment plan. No adverse drug reactions noted. Patient remains safe on the unit at this time.

## 2022-03-26 NOTE — Plan of Care (Signed)
  Problem: Education: Goal: Knowledge of General Education information will improve Description: Including pain rating scale, medication(s)/side effects and non-pharmacologic comfort measures Outcome: Progressing   Problem: Health Behavior/Discharge Planning: Goal: Ability to manage health-related needs will improve Outcome: Progressing   Problem: Clinical Measurements: Goal: Ability to maintain clinical measurements within normal limits will improve Outcome: Progressing Goal: Will remain free from infection Outcome: Progressing Goal: Diagnostic test results will improve Outcome: Progressing Goal: Respiratory complications will improve Outcome: Progressing Goal: Cardiovascular complication will be avoided Outcome: Progressing   Problem: Safety: Goal: Periods of time without injury will increase Outcome: Progressing   Problem: Physical Regulation: Goal: Ability to maintain clinical measurements within normal limits will improve Outcome: Progressing   Problem: Health Behavior/Discharge Planning: Goal: Identification of resources available to assist in meeting health care needs will improve Outcome: Progressing Goal: Compliance with treatment plan for underlying cause of condition will improve Outcome: Progressing   

## 2022-03-27 DIAGNOSIS — F250 Schizoaffective disorder, bipolar type: Secondary | ICD-10-CM | POA: Diagnosis not present

## 2022-03-27 MED ORDER — SENNOSIDES-DOCUSATE SODIUM 8.6-50 MG PO TABS: 1.0000 | ORAL_TABLET | Freq: Every day | ORAL | Status: DC | PRN

## 2022-03-27 MED ORDER — DOCUSATE SODIUM 100 MG PO CAPS
100.0000 mg | ORAL_CAPSULE | Freq: Two times a day (BID) | ORAL | Status: DC
  Administered 2022-03-27 – 2022-04-03 (×14): 100 mg via ORAL
  Filled 2022-03-27 (×14): qty 1

## 2022-03-27 NOTE — Progress Notes (Signed)
[facility] MD Progress Note  03/27/2022 2:49 PM Alyssa Mathis  MRN:  [id number] Subjective: Follow-up with this 36 year old woman with schizoaffective or bipolar disorder.  Saw her today wandering around the ward.  She was neatly dressed and groomed which of course is a big positive.  She still looked confused like she did not know where she was going or where she wanted to turn.  She sat down with me in the office and told me that she felt like things were no better and that she was exactly the same as yesterday but then a few minutes later turned around and said she thought she was better.  She acknowledged to me that she was still hearing voices but could not describe them anymore.  Not agitated or aggressive but still clearly not baseline with impaired cognition.  She did complain to me that she had lost her appetite.  She asked if it could be from the medicine and I told her that the medicine she was on typically actually increased appetite.   think that she may be having constipation Principal Problem: Schizoaffective disorder, bipolar type (HCC) Diagnosis: Principal Problem:   Schizoaffective disorder, bipolar type (HCC)  Total Time spent with patient: 30 minutes  Past Psychiatric History: Past history of recurrent episodes of psychosis  Past Medical History:  Past Medical History:  Diagnosis Date   Anemia    Hypertension    Post partum depression    Pregnancy induced hypertension    Schizoaffective disorder (HCC)     Past Surgical History:  Procedure Laterality Date   NO PAST SURGERIES     Family History:  Family History  Problem Relation Age of Onset   Hypertension Sister    Hypertension Maternal Grandmother    Diabetes Maternal Grandfather    Hypertension Maternal Grandfather    Family Psychiatric  History: See previous Social History:  Social History   Substance and Sexual Activity  Alcohol Use Never     Social History   Substance and Sexual Activity  Drug Use Not  Currently    Social History   Socioeconomic History   Marital status: Single    Spouse name: Not on file   Number of children: Not on file   Years of education: Not on file   Highest education level: Not on file  Occupational History   Not on file  Tobacco Use   Smoking status: Every Day    Types: Cigarettes   Smokeless tobacco: Never  Vaping Use   Vaping Use: Not on file  Substance and Sexual Activity   Alcohol use: Never   Drug use: Not Currently   Sexual activity: Yes  Other Topics Concern   Not on file  Social History Narrative   Not on file   Social Determinants of Health   Financial Resource Strain: Not on file  Food Insecurity: Not on file  Transportation Needs: Not on file  Physical Activity: Not on file  Stress: Not on file  Social Connections: Not on file   Additional Social History:                         Sleep: Fair  Appetite:  Poor  Current Medications: Current Facility-Administered Medications  Medication Dose Route Frequency Provider Last Rate Last Admin   acetaminophen (TYLENOL) tablet 650 mg  650 mg Oral Q6H PRN [name], NP   650 mg at 03/16/22 0840   alum & mag hydroxide-simeth (MAALOX/MYLANTA)  200-200-20 MG/5ML suspension 30 mL  30 mL Oral Q4H PRN [name] F, NP       benztropine (COGENTIN) tablet 0.5 mg  0.5 mg Oral BID [name], NP   0.5 mg at 03/27/22 0840   docusate sodium (COLACE) capsule 100 mg  100 mg Oral BID Billiejean Schimek T, MD       haloperidol (HALDOL) tablet 5 mg  5 mg Oral BID [name], NP   5 mg at 03/27/22 0841   hydrOXYzine (ATARAX) tablet 25 mg  25 mg Oral TID PRN [name], NP   25 mg at 03/27/22 0858   lithium carbonate (LITHOBID) CR tablet 300 mg  300 mg Oral Q12H Britainy Kozub T, MD   300 mg at 03/27/22 0840   LORazepam (ATIVAN) tablet 2 mg  2 mg Oral Q6H PRN Davonna Ertl T, MD   2 mg at 03/24/22 1304   Or   LORazepam (ATIVAN) injection 2 mg  2 mg Intramuscular Q6H PRN  Gail Vendetti T, MD       magnesium hydroxide (MILK OF MAGNESIA) suspension 30 mL  30 mL Oral Daily PRN [name] F, NP   30 mL at 03/15/22 0811   metoprolol succinate (TOPROL-XL) 24 hr tablet 25 mg  25 mg Oral Daily [name], NP   25 mg at 03/27/22 0840   nicotine polacrilex (NICORETTE) gum 2 mg  2 mg Oral PRN Sarina Ill, DO   2 mg at 03/25/22 1722   NIFEdipine (PROCARDIA-XL/NIFEDICAL-XL) 24 hr tablet 90 mg  90 mg Oral Daily Lachina Salsberry, [name], MD   90 mg at 03/27/22 0840   OLANZapine (ZYPREXA) tablet 5 mg  5 mg Oral Q6H PRN Magin Balbi, [name], MD       OLANZapine zydis (ZYPREXA) disintegrating tablet 15 mg  15 mg Oral QHS Reannah Totten T, MD   15 mg at 03/26/22 2100   senna-docusate (Senokot-S) tablet 1 tablet  1 tablet Oral Daily PRN Lailana Shira, [name], MD        Lab Results: No results found for this or any previous visit (from the past 48 hour(s)).  Blood Alcohol level:  Lab Results  Component Value Date   ETH <10 03/12/2022   ETH <10 01/13/2021    Metabolic Disorder Labs: Lab Results  Component Value Date   HGBA1C 5.0 03/14/2022   MPG 97 03/14/2022   MPG 91 06/27/2020   No results found for: "PROLACTIN" Lab Results  Component Value Date   CHOL 143 03/14/2022   TRIG 40 03/14/2022   HDL 61 03/14/2022   CHOLHDL 2.3 03/14/2022   VLDL 8 03/14/2022   LDLCALC 74 03/14/2022   LDLCALC 82 06/27/2020    Physical Findings: AIMS:  , ,  ,  ,    CIWA:    COWS:     Musculoskeletal: Strength & Muscle Tone: within normal limits Gait & Station: normal Patient leans: N/A  Psychiatric Specialty Exam:  Presentation  General Appearance: Appropriate for Environment  Eye Contact:Good  Speech:Blocked (appears improved from earlier)  Speech Volume:Normal  Handedness:No data recorded  Mood and Affect  Mood:Dysphoric  Affect:Blunt   Thought Process  Thought Processes:Disorganized  Descriptions of Associations:No data recorded Orientation:Full (Time,  Place and Person)  Thought Content:Scattered  History of Schizophrenia/Schizoaffective disorder:Yes  Duration of Psychotic Symptoms:Greater than six months  Hallucinations:No data recorded Ideas of Reference:Delusions  Suicidal Thoughts:No data recorded Homicidal Thoughts:No data recorded  Sensorium  Memory:Immediate Poor  Judgment:Poor  Insight:Fair   Executive Functions  Concentration:Fair  Attention Span:Fair  [place]   Psychomotor Activity  Psychomotor Activity:No data recorded  Assets  Assets:Resilience; Social Support; Housing; Intimacy; Financial Resources/Insurance   Sleep  Sleep:No data recorded   Physical Exam: Physical Exam Vitals and nursing note reviewed.  Constitutional:      Appearance: Normal appearance.  HENT:     Head: Normocephalic and atraumatic.     Mouth/Throat:     Pharynx: Oropharynx is clear.  Eyes:     Pupils: Pupils are equal, round, and reactive to light.  Cardiovascular:     Rate and Rhythm: Normal rate and regular rhythm.  Pulmonary:     Effort: Pulmonary effort is normal.     Breath sounds: Normal breath sounds.  Abdominal:     General: Abdomen is flat.     Palpations: Abdomen is soft.  Musculoskeletal:        General: Normal range of motion.  Skin:    General: Skin is warm and dry.  Neurological:     General: No focal deficit present.     Mental Status: She is alert. Mental status is at baseline.  Psychiatric:        Attention and Perception: She is inattentive.        Mood and Affect: Mood normal. Affect is blunt.        Speech: Speech is delayed.        Behavior: Behavior is slowed.        Cognition and Memory: Cognition is impaired.    Review of Systems  Constitutional: Negative.   HENT: Negative.    Eyes: Negative.   Respiratory: Negative.    Cardiovascular: Negative.   Gastrointestinal: Negative.   Musculoskeletal: Negative.   Skin: Negative.    Neurological: Negative.   Psychiatric/Behavioral:  Positive for hallucinations.    Blood pressure 103/71, pulse 84, temperature 98.7 F (37.1 C), temperature source Oral, resp. rate 17, height 5\' 5"  (1.651 m), weight 72.6 kg, SpO2 94 %, unknown if currently breastfeeding. Body mass index is 26.63 kg/m.   Treatment Plan Summary: Medication management and Plan my best guess as to why she has lost her appetite would be constipation.  Second best guess might be a symptom of depression.  I added stool softener and a as needed laxative and tried to explain very clearly to her that she could ask for that once a day if she felt she was having trouble with her bowel movements.  Otherwise gave her some support that honestly I think she may be showing gradual signs of improvement.  No change to current medicine she is tolerating the lithium adequately.  Alethia Berthold, MD 03/27/2022, 2:49 PM

## 2022-03-27 NOTE — Progress Notes (Addendum)
Pt presents with flat, worried affect and fidgety on initial interactions. She's also apprehensive "well, I'm not sure". Reports fair appetite with good sleep. Denies SI, HI, AVH and pain "not right now". Rates her depression 0/10 and anxiety 5/10 reports "I'm worried about my children. I know they are with my family while I get help. I don't want them to get hurt". PRN Vistaril given at 0858 and pt reported relief at 0950 when reassessed. Still remains suspicious and paranoid, believed someone else besides family will pick up her phone call instead of them "I'm scared, I don't trust it". Takes her medications with increased prompts "starring at medications, reading labels "I'm not sure".  Safety checks maintained at Q 15 minutes intervals without incident. Verbal education provided on all medications. Support, reassurance and encouragement offered to pt. She remains safe in milieu without outburst.

## 2022-03-27 NOTE — Group Note (Signed)
[facility] LCSW Group Therapy Note   Group Date: 03/27/2022 Start Time: 1300 End Time: 1400  Type of Therapy/Topic:  Group Therapy:  Feelings about Diagnosis  Participation Level:  Did Not Attend    Description of Group:    This group will allow patients to explore their thoughts and feelings about diagnoses they have received. Patients will be guided to explore their level of understanding and acceptance of these diagnoses. Facilitator will encourage patients to process their thoughts and feelings about the reactions of others to their diagnosis, and will guide patients in identifying ways to discuss their diagnosis with significant others in their lives. This group will be process-oriented, with patients participating in exploration of their own experiences as well as giving and receiving support and challenge from other group members.   Therapeutic Goals: 1. Patient will demonstrate understanding of diagnosis as evidence by identifying two or more symptoms of the disorder:  2. Patient will be able to express two feelings regarding the diagnosis 3. Patient will demonstrate ability to communicate their needs through discussion and/or role plays  Summary of Patient Progress: Group was offered, however, patient chose not to attend.   Therapeutic Modalities:   Cognitive Behavioral Therapy Brief Therapy Feelings Identification    Harden Mo, LCSW

## 2022-03-28 DIAGNOSIS — F250 Schizoaffective disorder, bipolar type: Secondary | ICD-10-CM | POA: Diagnosis not present

## 2022-03-28 NOTE — Progress Notes (Signed)
Staff made Alyssa Mathis aware that snacks were being served. Nary was pleasant but presents preoccupied and suspicious.  She declined snack stating that she was not hungry at the moment. She has been isolative to her room since the start of shift sleeping or reading her bible.  Terrel denies si/hi/avh depression and anxiety at this encounter.  Staff made her aware that snack would be available if she felt the need later.       C Butler-Nicholson, LPN

## 2022-03-28 NOTE — Progress Notes (Signed)
Alyssa Mathis refused her qhs medication tonight. Was awaken out of sleep so she may have been disoriented. Stated that it was too  late to take the medication and went back to her room.  She presented preoccupied and suspicious. Kept looking at the pills even after the pills were laid before her were she could see the labels. She denies si/hi/avh at this encounter but still appears to be responding to internal stimuli. Will continue to encourage [name] to take her medication. For now she is safe on the unit with q 15 minute safety checks.      C Butler-Nicholson, LPN

## 2022-03-28 NOTE — Progress Notes (Addendum)
1500 and 1700 medications have been moved to 2000 due to late administration of 0800 medications.

## 2022-03-28 NOTE — Progress Notes (Signed)
Patient sleep in her bed.  Did not want to awaken to take medication.  She has been apprehensive and suspicious with taking her medication as of late. Will attempt again if/when she awakens. For now she is safe with q15 minute safety checks.    C Butler-Nicholson, LPN

## 2022-03-28 NOTE — Group Note (Signed)
[facility] LCSW Group Therapy Note   Group Date: 03/28/2022 Start Time: 1300 End Time: 1400   Type of Therapy/Topic:  Group Therapy:  Emotion Regulation  Participation Level:  Did Not Attend    Description of Group:    The purpose of this group is to assist patients in learning to regulate negative emotions and experience positive emotions. Patients will be guided to discuss ways in which they have been vulnerable to their negative emotions. These vulnerabilities will be juxtaposed with experiences of positive emotions or situations, and patients challenged to use positive emotions to combat negative ones. Special emphasis will be placed on coping with negative emotions in conflict situations, and patients will process healthy conflict resolution skills.  Therapeutic Goals: Patient will identify two positive emotions or experiences to reflect on in order to balance out negative emotions:  Patient will label two or more emotions that they find the most difficult to experience:  Patient will be able to demonstrate positive conflict resolution skills through discussion or role plays:   Summary of Patient Progress: X   Therapeutic Modalities:   Cognitive Behavioral Therapy Feelings Identification Dialectical Behavioral Therapy   [name], LCSW

## 2022-03-28 NOTE — Progress Notes (Signed)
Recreation Therapy Notes   Date: 03/28/2022  Time: 10:40 am    Location: Courtyard       Behavioral response: N/A   Intervention Topic: Leisure    Discussion/Intervention: Patient refused to attend group.   Clinical Observations/Feedback:  Patient refused to attend group.    Emmerson Shuffield LRT/CTRS        Leomar Westberg 03/28/2022 12:12 PM

## 2022-03-28 NOTE — Plan of Care (Signed)
  Problem: Education: Goal: Knowledge of [facility] Education information/materials will improve Outcome: Progressing Goal: Verbalization of understanding the information provided will improve Outcome: Progressing   Problem: Health Behavior/Discharge Planning: Goal: Compliance with treatment plan for underlying cause of condition will improve Outcome: Progressing   Problem: Safety: Goal: Periods of time without injury will increase Outcome: Progressing   

## 2022-03-28 NOTE — Progress Notes (Signed)
D: Patient alert and oriented. Patient denies pain. Patient denies anxiety and depression. Patient denies SI/HI/AVH.   Around 1600, patient frequently comes up to the nurses station. Patient became suspicious of people she saw coming on to the unit. Patient would ask writer if the staff members she saw on the unit were out to get her. After being told that all staff members are here to help her she states that she will need to be reminded later.   A: Scheduled medications administered to patient, per MD orders.  Support and encouragement provided to patient.  Q15 minute safety checks maintained.   R: Patient compliant with medication administration and treatment plan. No adverse drug reactions noted. Patient remains safe on the unit at this time.

## 2022-03-28 NOTE — Progress Notes (Signed)
[facility] MD Progress Note  03/28/2022 11:05 AM Alyssa Mathis  MRN:  [id number] Subjective: Follow-up for 36 year old woman with schizoaffective or bipolar disorder.  Patient seen today.  She once again is noted to be taking good care of herself and was able to eat a although she is very slow with her movements.  Attention is seeming to be impaired.  Patient acknowledges still having hallucinations.  Affect is still a little constricted and unusual often looking frightened but not having any acting out behavior.  She smiled a little bit more today and did seem like perhaps her mood was a bit more stable Principal Problem: Schizoaffective disorder, bipolar type (HCC) Diagnosis: Principal Problem:   Schizoaffective disorder, bipolar type (HCC)  Total Time spent with patient: 30 minutes  Past Psychiatric History: Past history of manic and depressive symptoms  Past Medical History:  Past Medical History:  Diagnosis Date   Anemia    Hypertension    Post partum depression    Pregnancy induced hypertension    Schizoaffective disorder (HCC)     Past Surgical History:  Procedure Laterality Date   NO PAST SURGERIES     Family History:  Family History  Problem Relation Age of Onset   Hypertension Sister    Hypertension Maternal Grandmother    Diabetes Maternal Grandfather    Hypertension Maternal Grandfather    Family Psychiatric  History: See previous Social History:  Social History   Substance and Sexual Activity  Alcohol Use Never     Social History   Substance and Sexual Activity  Drug Use Not Currently    Social History   Socioeconomic History   Marital status: Single    Spouse name: Not on file   Number of children: Not on file   Years of education: Not on file   Highest education level: Not on file  Occupational History   Not on file  Tobacco Use   Smoking status: Every Day    Types: Cigarettes   Smokeless tobacco: Never  Vaping Use   Vaping Use: Not on file   Substance and Sexual Activity   Alcohol use: Never   Drug use: Not Currently   Sexual activity: Yes  Other Topics Concern   Not on file  Social History Narrative   Not on file   Social Determinants of Health   Financial Resource Strain: Not on file  Food Insecurity: Not on file  Transportation Needs: Not on file  Physical Activity: Not on file  Stress: Not on file  Social Connections: Not on file   Additional Social History:                         Sleep: Fair  Appetite:  Fair  Current Medications: Current Facility-Administered Medications  Medication Dose Route Frequency Provider Last Rate Last Admin   acetaminophen (TYLENOL) tablet 650 mg  650 mg Oral Q6H PRN [name] F, NP   650 mg at 03/16/22 0840   alum & mag hydroxide-simeth (MAALOX/MYLANTA) 200-200-20 MG/5ML suspension 30 mL  30 mL Oral Q4H PRN [name] F, NP       benztropine (COGENTIN) tablet 0.5 mg  0.5 mg Oral BID [name], NP   0.5 mg at 03/28/22 1018   docusate sodium (COLACE) capsule 100 mg  100 mg Oral BID Lashandra Arauz T, MD   100 mg at 03/28/22 1018   haloperidol (HALDOL) tablet 5 mg  5 mg Oral BID Lord,  Herminio Heads, NP   5 mg at 03/28/22 1018   hydrOXYzine (ATARAX) tablet 25 mg  25 mg Oral TID PRN [name], NP   25 mg at 03/27/22 0858   lithium carbonate (LITHOBID) CR tablet 300 mg  300 mg Oral Q12H Dowell Hoon, [name], MD   300 mg at 03/28/22 0737   LORazepam (ATIVAN) tablet 2 mg  2 mg Oral Q6H PRN Lakitha Gordy T, MD   2 mg at 03/24/22 1304   Or   LORazepam (ATIVAN) injection 2 mg  2 mg Intramuscular Q6H PRN Lashawnna Lambrecht T, MD       magnesium hydroxide (MILK OF MAGNESIA) suspension 30 mL  30 mL Oral Daily PRN [name] F, NP   30 mL at 03/15/22 0811   metoprolol succinate (TOPROL-XL) 24 hr tablet 25 mg  25 mg Oral Daily [name], NP   25 mg at 03/28/22 1018   nicotine polacrilex (NICORETTE) gum 2 mg  2 mg Oral PRN Sarina Ill, DO   2 mg at  03/25/22 1722   NIFEdipine (PROCARDIA-XL/NIFEDICAL-XL) 24 hr tablet 90 mg  90 mg Oral Daily Saisha Hogue T, MD   90 mg at 03/28/22 1017   OLANZapine (ZYPREXA) tablet 5 mg  5 mg Oral Q6H PRN Takela Varden, [name], MD       OLANZapine zydis (ZYPREXA) disintegrating tablet 15 mg  15 mg Oral QHS Raekwan Spelman, [name], MD   15 mg at 03/28/22 1062   senna-docusate (Senokot-S) tablet 1 tablet  1 tablet Oral Daily PRN Kabao Leite, [name], MD        Lab Results: No results found for this or any previous visit (from the past 48 hour(s)).  Blood Alcohol level:  Lab Results  Component Value Date   ETH <10 03/12/2022   ETH <10 01/13/2021    Metabolic Disorder Labs: Lab Results  Component Value Date   HGBA1C 5.0 03/14/2022   MPG 97 03/14/2022   MPG 91 06/27/2020   No results found for: "PROLACTIN" Lab Results  Component Value Date   CHOL 143 03/14/2022   TRIG 40 03/14/2022   HDL 61 03/14/2022   CHOLHDL 2.3 03/14/2022   VLDL 8 03/14/2022   LDLCALC 74 03/14/2022   LDLCALC 82 06/27/2020    Physical Findings: AIMS:  , ,  ,  ,    CIWA:    COWS:     Musculoskeletal: Strength & Muscle Tone: within normal limits Gait & Station: normal Patient leans: N/A  Psychiatric Specialty Exam:  Presentation  General Appearance: Appropriate for Environment  Eye Contact:Good  Speech:Blocked (appears improved from earlier)  Speech Volume:Normal  Handedness:No data recorded  Mood and Affect  Mood:Dysphoric  Affect:Blunt   Thought Process  Thought Processes:Disorganized  Descriptions of Associations:No data recorded Orientation:Full (Time, Place and Person)  Thought Content:Scattered  History of Schizophrenia/Schizoaffective disorder:Yes  Duration of Psychotic Symptoms:Greater than six months  Hallucinations:No data recorded Ideas of Reference:Delusions  Suicidal Thoughts:No data recorded Homicidal Thoughts:No data recorded  Sensorium  Memory:Immediate  Poor  Judgment:Poor  Insight:Fair   Executive Functions  Concentration:Fair  Attention Span:Fair  Recall:Fair  Fund of Knowledge:Fair  Language:Fair   Psychomotor Activity  Psychomotor Activity:No data recorded  Assets  Assets:Resilience; Social Support; Housing; Intimacy; Financial Resources/Insurance   Sleep  Sleep:No data recorded   Physical Exam: Physical Exam Vitals and nursing note reviewed.  Constitutional:      Appearance: Normal appearance.  HENT:     Head: Normocephalic  and atraumatic.     Mouth/Throat:     Pharynx: Oropharynx is clear.  Eyes:     Pupils: Pupils are equal, round, and reactive to light.  Cardiovascular:     Rate and Rhythm: Normal rate and regular rhythm.  Pulmonary:     Effort: Pulmonary effort is normal.     Breath sounds: Normal breath sounds.  Abdominal:     General: Abdomen is flat.     Palpations: Abdomen is soft.  Musculoskeletal:        General: Normal range of motion.  Skin:    General: Skin is warm and dry.  Neurological:     General: No focal deficit present.     Mental Status: She is alert. Mental status is at baseline.  Psychiatric:        Attention and Perception: She is inattentive.        Mood and Affect: Mood normal. Affect is blunt and flat.        Speech: Speech is delayed.        Behavior: Behavior is slowed.        Cognition and Memory: Memory is impaired.    Review of Systems  Constitutional: Negative.   HENT: Negative.    Eyes: Negative.   Respiratory: Negative.    Cardiovascular: Negative.   Gastrointestinal: Negative.   Musculoskeletal: Negative.   Skin: Negative.   Neurological: Negative.   Psychiatric/Behavioral:  Positive for hallucinations.    Blood pressure 110/72, pulse 82, temperature 98.9 F (37.2 C), temperature source Oral, resp. rate 16, height 5\' 5"  (1.651 m), weight 72.6 kg, SpO2 99 %, unknown if currently breastfeeding. Body mass index is 26.63 kg/m.   Treatment Plan  Summary: Plan plan is to continue current medication management with last change being the initiation of starting dose lithium.  May start increasing dosage if she continues to tolerate it in the next couple days.  Encourage group attendance.  We will try to get in contact with her family to make sure we are staying in touch with them.  Alethia Berthold, MD 03/28/2022, 11:05 AM

## 2022-03-29 DIAGNOSIS — F250 Schizoaffective disorder, bipolar type: Secondary | ICD-10-CM | POA: Diagnosis not present

## 2022-03-29 LAB — LITHIUM LEVEL: Lithium Lvl: 0.67 mmol/L (ref 0.60–1.20)

## 2022-03-29 MED ORDER — NICOTINE POLACRILEX 2 MG MT GUM: 2.0000 mg | CHEWING_GUM | OROMUCOSAL | Status: DC | PRN

## 2022-03-29 NOTE — Progress Notes (Signed)
Recreation Therapy Notes  Date: 03/29/2022   Time: 10:20 am     Location: Craft room         Behavioral response: N/A   Intervention Topic: Strengths    Discussion/Intervention: Patient refused to attend group.    Clinical Observations/Feedback:  Patient refused to attend group.    Bettie Capistran LRT/CTRS          Lexys Milliner 03/29/2022 10:56 AM

## 2022-03-29 NOTE — Group Note (Signed)
[facility] LCSW Group Therapy Note   Group Date: 03/29/2022 Start Time: 1300 End Time: 1400   Type of Therapy/Topic:  Group Therapy:  Balance in Life  Participation Level:  Did Not Attend   Description of Group:    This group will address the concept of balance and how it feels and looks when one is unbalanced. Patients will be encouraged to process areas in their lives that are out of balance, and identify reasons for remaining unbalanced. Facilitators will guide patients utilizing problem- solving interventions to address and correct the stressor making their life unbalanced. Understanding and applying boundaries will be explored and addressed for obtaining  and maintaining a balanced life. Patients will be encouraged to explore ways to assertively make their unbalanced needs known to significant others in their lives, using other group members and facilitator for support and feedback.  Therapeutic Goals: Patient will identify two or more emotions or situations they have that consume much of in their lives. Patient will identify signs/triggers that life has become out of balance:  Patient will identify two ways to set boundaries in order to achieve balance in their lives:  Patient will demonstrate ability to communicate their needs through discussion and/or role plays  Summary of Patient Progress:    X    Therapeutic Modalities:   Cognitive Behavioral Therapy Solution-Focused Therapy Assertiveness Training   Rico Massar J Jamala Kohen, LCSW 

## 2022-03-29 NOTE — Progress Notes (Signed)
[facility] MD Progress Note  03/29/2022 2:27 PM Alyssa Mathis  MRN:  [id number] Subjective: Follow-up for this 36 year old woman with bipolar or schizoaffective disorder.  Patient seen and chart reviewed.  She is still disorganized in her thinking.  Had trouble getting her thoughts together and continues to be slow with the conversation but did not make any obviously bizarre statements.  She actually displayed a little more full range affect today laughing at 1 point in an appropriate way during the conversation. Principal Problem: Schizoaffective disorder, bipolar type (HCC) Diagnosis: Principal Problem:   Schizoaffective disorder, bipolar type (HCC)  Total Time spent with patient: 30 minutes  Past Psychiatric History: Past history of recurrent psychotic episodes  Past Medical History:  Past Medical History:  Diagnosis Date   Anemia    Hypertension    Post partum depression    Pregnancy induced hypertension    Schizoaffective disorder (HCC)     Past Surgical History:  Procedure Laterality Date   NO PAST SURGERIES     Family History:  Family History  Problem Relation Age of Onset   Hypertension Sister    Hypertension Maternal Grandmother    Diabetes Maternal Grandfather    Hypertension Maternal Grandfather    Family Psychiatric  History: See previous Social History:  Social History   Substance and Sexual Activity  Alcohol Use Never     Social History   Substance and Sexual Activity  Drug Use Not Currently    Social History   Socioeconomic History   Marital status: Single    Spouse name: Not on file   Number of children: Not on file   Years of education: Not on file   Highest education level: Not on file  Occupational History   Not on file  Tobacco Use   Smoking status: Every Day    Types: Cigarettes   Smokeless tobacco: Never  Vaping Use   Vaping Use: Not on file  Substance and Sexual Activity   Alcohol use: Never   Drug use: Not Currently   Sexual activity:  Yes  Other Topics Concern   Not on file  Social History Narrative   Not on file   Social Determinants of Health   Financial Resource Strain: Not on file  Food Insecurity: Not on file  Transportation Needs: Not on file  Physical Activity: Not on file  Stress: Not on file  Social Connections: Not on file   Additional Social History:                         Sleep: Fair  Appetite:  Fair  Current Medications: Current Facility-Administered Medications  Medication Dose Route Frequency Provider Last Rate Last Admin   acetaminophen (TYLENOL) tablet 650 mg  650 mg Oral Q6H PRN [name] F, NP   650 mg at 03/16/22 0840   alum & mag hydroxide-simeth (MAALOX/MYLANTA) 200-200-20 MG/5ML suspension 30 mL  30 mL Oral Q4H PRN [name] F, NP       benztropine (COGENTIN) tablet 0.5 mg  0.5 mg Oral BID [name], NP   0.5 mg at 03/29/22 2440   docusate sodium (COLACE) capsule 100 mg  100 mg Oral BID Isabelle Matt T, MD   100 mg at 03/29/22 0828   haloperidol (HALDOL) tablet 5 mg  5 mg Oral BID [name], NP   5 mg at 03/29/22 1027   hydrOXYzine (ATARAX) tablet 25 mg  25 mg Oral TID PRN Barthold,  Nickola Major, NP   25 mg at 03/27/22 0858   lithium carbonate (LITHOBID) CR tablet 300 mg  300 mg Oral Q12H Marchia Diguglielmo, [name], MD   300 mg at 03/29/22 8115   LORazepam (ATIVAN) tablet 2 mg  2 mg Oral Q6H PRN Abron Neddo T, MD   2 mg at 03/24/22 1304   Or   LORazepam (ATIVAN) injection 2 mg  2 mg Intramuscular Q6H PRN Othon Guardia T, MD       magnesium hydroxide (MILK OF MAGNESIA) suspension 30 mL  30 mL Oral Daily PRN [name] F, NP   30 mL at 03/15/22 0811   metoprolol succinate (TOPROL-XL) 24 hr tablet 25 mg  25 mg Oral Daily [name], NP   25 mg at 03/28/22 1018   nicotine polacrilex (NICORETTE) gum 2 mg  2 mg Oral PRN Sarina Ill, DO   2 mg at 03/28/22 1613   NIFEdipine (PROCARDIA-XL/NIFEDICAL-XL) 24 hr tablet 90 mg  90 mg Oral Daily Kinzy Weyers,  [name], MD   90 mg at 03/28/22 1017   OLANZapine (ZYPREXA) tablet 5 mg  5 mg Oral Q6H PRN Rakeen Gaillard, [name], MD       OLANZapine zydis (ZYPREXA) disintegrating tablet 15 mg  15 mg Oral QHS Justo Hengel T, MD   15 mg at 03/29/22 0023   senna-docusate (Senokot-S) tablet 1 tablet  1 tablet Oral Daily PRN Jubilee Vivero, [name], MD        Lab Results: No results found for this or any previous visit (from the past 48 hour(s)).  Blood Alcohol level:  Lab Results  Component Value Date   ETH <10 03/12/2022   ETH <10 01/13/2021    Metabolic Disorder Labs: Lab Results  Component Value Date   HGBA1C 5.0 03/14/2022   MPG 97 03/14/2022   MPG 91 06/27/2020   No results found for: "PROLACTIN" Lab Results  Component Value Date   CHOL 143 03/14/2022   TRIG 40 03/14/2022   HDL 61 03/14/2022   CHOLHDL 2.3 03/14/2022   VLDL 8 03/14/2022   LDLCALC 74 03/14/2022   LDLCALC 82 06/27/2020    Physical Findings: AIMS:  , ,  ,  ,    CIWA:    COWS:     Musculoskeletal: Strength & Muscle Tone: within normal limits Gait & Station: normal Patient leans: N/A  Psychiatric Specialty Exam:  Presentation  General Appearance: Appropriate for Environment  Eye Contact:Good  Speech:Blocked (appears improved from earlier)  Speech Volume:Normal  Handedness:No data recorded  Mood and Affect  Mood:Dysphoric  Affect:Blunt   Thought Process  Thought Processes:Disorganized  Descriptions of Associations:No data recorded Orientation:Full (Time, Place and Person)  Thought Content:Scattered  History of Schizophrenia/Schizoaffective disorder:Yes  Duration of Psychotic Symptoms:Greater than six months  Hallucinations:No data recorded Ideas of Reference:Delusions  Suicidal Thoughts:No data recorded Homicidal Thoughts:No data recorded  Sensorium  Memory:Immediate Poor  Judgment:Poor  Insight:Fair   Executive Functions  Concentration:Fair  Attention Span:Fair  Recall:Fair  Fund of  Knowledge:Fair  Language:Fair   Psychomotor Activity  Psychomotor Activity:No data recorded  Assets  Assets:Resilience; Social Support; Housing; Intimacy; Financial Resources/Insurance   Sleep  Sleep:No data recorded   Physical Exam: Physical Exam Vitals and nursing note reviewed.  Constitutional:      Appearance: Normal appearance.  HENT:     Head: Normocephalic and atraumatic.     Mouth/Throat:     Pharynx: Oropharynx is clear.  Eyes:     Pupils: Pupils are  equal, round, and reactive to light.  Cardiovascular:     Rate and Rhythm: Normal rate and regular rhythm.  Pulmonary:     Effort: Pulmonary effort is normal.     Breath sounds: Normal breath sounds.  Abdominal:     General: Abdomen is flat.     Palpations: Abdomen is soft.  Musculoskeletal:        General: Normal range of motion.  Skin:    General: Skin is warm and dry.  Neurological:     General: No focal deficit present.     Mental Status: She is alert. Mental status is at baseline.  Psychiatric:        Attention and Perception: Attention normal.        Mood and Affect: Mood normal. Affect is blunt.        Speech: Speech is delayed.        Behavior: Behavior is slowed.        Thought Content: Thought content does not include homicidal or suicidal ideation.        Cognition and Memory: Cognition is impaired.    Review of Systems  Constitutional: Negative.   HENT: Negative.    Eyes: Negative.   Respiratory: Negative.    Cardiovascular: Negative.   Gastrointestinal: Negative.   Musculoskeletal: Negative.   Skin: Negative.   Neurological: Negative.   Psychiatric/Behavioral:  Negative for depression, hallucinations, memory loss, substance abuse and suicidal ideas. The patient is nervous/anxious. The patient does not have insomnia.    Blood pressure 106/65, pulse 72, temperature 98.6 F (37 C), temperature source Oral, resp. rate 16, height 5\' 5"  (1.651 m), weight 72.6 kg, SpO2 100 %, unknown if  currently breastfeeding. Body mass index is 26.63 kg/m.   Treatment Plan Summary: Medication management and Plan when I say she looks a little better I do not want to cover up the fact that she is still clearly not at her baseline and is clearly still in no shape to go home especially given that she has small children at home.  She is not able to carry on a really lucid conversation or function without specific directions around everything she does.  I will be checking a lithium level this afternoon.  Should be a reasonable time to check it since the last dose was 830 this morning.  We can then make adjustments as needed.  Alethia Berthold, MD 03/29/2022, 2:27 PM

## 2022-03-29 NOTE — Plan of Care (Signed)
Pt denies depression, anxiety, SI, HI and AVH. Pt was educated on care plan and verbalizes understanding. Torrie Mayers RN Problem: Education: Goal: Knowledge of General Education information will improve Description: Including pain rating scale, medication(s)/side effects and non-pharmacologic comfort measures Outcome: Not Progressing   Problem: Health Behavior/Discharge Planning: Goal: Ability to manage health-related needs will improve Outcome: Not Progressing   Problem: Clinical Measurements: Goal: Ability to maintain clinical measurements within normal limits will improve Outcome: Not Progressing Goal: Will remain free from infection Outcome: Not Progressing Goal: Diagnostic test results will improve Outcome: Not Progressing Goal: Respiratory complications will improve Outcome: Not Progressing Goal: Cardiovascular complication will be avoided Outcome: Not Progressing   Problem: Activity: Goal: Risk for activity intolerance will decrease Outcome: Not Progressing   Problem: Nutrition: Goal: Adequate nutrition will be maintained Outcome: Not Progressing   Problem: Coping: Goal: Level of anxiety will decrease Outcome: Not Progressing   Problem: Elimination: Goal: Will not experience complications related to bowel motility Outcome: Not Progressing Goal: Will not experience complications related to urinary retention Outcome: Not Progressing   Problem: Pain Managment: Goal: General experience of comfort will improve Outcome: Not Progressing   Problem: Safety: Goal: Ability to remain free from injury will improve Outcome: Not Progressing   Problem: Skin Integrity: Goal: Risk for impaired skin integrity will decrease Outcome: Not Progressing   Problem: Activity: Goal: Will verbalize the importance of balancing activity with adequate rest periods Outcome: Not Progressing   Problem: Education: Goal: Will be free of psychotic symptoms Outcome: Not Progressing Goal:  Knowledge of the prescribed therapeutic regimen will improve Outcome: Not Progressing   Problem: Coping: Goal: Coping ability will improve Outcome: Not Progressing Goal: Will verbalize feelings Outcome: Not Progressing   Problem: Health Behavior/Discharge Planning: Goal: Compliance with prescribed medication regimen will improve Outcome: Not Progressing   Problem: Nutritional: Goal: Ability to achieve adequate nutritional intake will improve Outcome: Not Progressing   Problem: Role Relationship: Goal: Ability to communicate needs accurately will improve Outcome: Not Progressing Goal: Ability to interact with others will improve Outcome: Not Progressing   Problem: Safety: Goal: Ability to redirect hostility and anger into socially appropriate behaviors will improve Outcome: Not Progressing Goal: Ability to remain free from injury will improve Outcome: Not Progressing   Problem: Self-Care: Goal: Ability to participate in self-care as condition permits will improve Outcome: Not Progressing   Problem: Self-Concept: Goal: Will verbalize positive feelings about self Outcome: Not Progressing   Problem: Education: Goal: Knowledge of [place] [facility] Education information/materials will improve Outcome: Not Progressing Goal: Emotional status will improve Outcome: Not Progressing Goal: Mental status will improve Outcome: Not Progressing Goal: Verbalization of understanding the information provided will improve Outcome: Not Progressing   Problem: Activity: Goal: Interest or engagement in activities will improve Outcome: Not Progressing Goal: Sleeping patterns will improve Outcome: Not Progressing   Problem: Coping: Goal: Ability to verbalize frustrations and anger appropriately will improve Outcome: Not Progressing Goal: Ability to demonstrate self-control will improve Outcome: Not Progressing   Problem: Health Behavior/Discharge Planning: Goal: Identification of  resources available to assist in meeting health care needs will improve Outcome: Not Progressing Goal: Compliance with treatment plan for underlying cause of condition will improve Outcome: Not Progressing   Problem: Physical Regulation: Goal: Ability to maintain clinical measurements within normal limits will improve Outcome: Not Progressing   Problem: Safety: Goal: Periods of time without injury will increase Outcome: Not Progressing

## 2022-03-29 NOTE — Progress Notes (Signed)
Pt has been pleasant, calm and cooperative. Pt seems lost at times, slow to communicate at times but then other times assertive. Pt has a good appetite and has been med compliant. Pt also took a shower today. Pt is safe. Torrie Mayers RN

## 2022-03-29 NOTE — BH IP Treatment Plan (Signed)
Interdisciplinary Treatment and Diagnostic Plan Update  03/29/2022 Time of Session: 8:30am Alyssa Mathis MRN: [id number]  Principal Diagnosis: Schizoaffective disorder, bipolar type (HCC)  Secondary Diagnoses: Principal Problem:   Schizoaffective disorder, bipolar type (HCC)   Current Medications:  Current Facility-Administered Medications  Medication Dose Route Frequency Provider Last Rate Last Admin   acetaminophen (TYLENOL) tablet 650 mg  650 mg Oral Q6H PRN [name], NP   650 mg at 03/16/22 0840   alum & mag hydroxide-simeth (MAALOX/MYLANTA) 200-200-20 MG/5ML suspension 30 mL  30 mL Oral Q4H PRN [name], NP       benztropine (COGENTIN) tablet 0.5 mg  0.5 mg Oral BID [name], NP   0.5 mg at 03/29/22 4128   docusate sodium (COLACE) capsule 100 mg  100 mg Oral BID [name], [name], MD   100 mg at 03/29/22 7867   haloperidol (HALDOL) tablet 5 mg  5 mg Oral BID [name], NP   5 mg at 03/29/22 6720   hydrOXYzine (ATARAX) tablet 25 mg  25 mg Oral TID PRN [name], NP   25 mg at 03/27/22 0858   lithium carbonate (LITHOBID) CR tablet 300 mg  300 mg Oral Q12H [name], [name], MD   300 mg at 03/29/22 9470   LORazepam (ATIVAN) tablet 2 mg  2 mg Oral Q6H PRN [name], [name], MD   2 mg at 03/24/22 1304   Or   LORazepam (ATIVAN) injection 2 mg  2 mg Intramuscular Q6H PRN [name], [name], MD       magnesium hydroxide (MILK OF MAGNESIA) suspension 30 mL  30 mL Oral Daily PRN [name], NP   30 mL at 03/15/22 0811   metoprolol succinate (TOPROL-XL) 24 hr tablet 25 mg  25 mg Oral Daily [name], NP   25 mg at 03/28/22 1018   nicotine polacrilex (NICORETTE) gum 2 mg  2 mg Oral PRN Sarina Ill, DO   2 mg at 03/28/22 1613   NIFEdipine (PROCARDIA-XL/NIFEDICAL-XL) 24 hr tablet 90 mg  90 mg Oral Daily [name], [name], MD   90 mg at 03/28/22 1017   OLANZapine (ZYPREXA) tablet 5 mg  5 mg Oral Q6H PRN [name], [name], MD       OLANZapine  zydis (ZYPREXA) disintegrating tablet 15 mg  15 mg Oral QHS [name], [name], MD   15 mg at 03/29/22 0023   senna-docusate (Senokot-S) tablet 1 tablet  1 tablet Oral Daily PRN [name], [name], MD       PTA Medications: Medications Prior to Admission  Medication Sig Dispense Refill Last Dose   acetaminophen (TYLENOL) 325 MG tablet Take 2 tablets (650 mg total) by mouth every 4 (four) hours as needed (for pain scale < 4).   prn   NIFEdipine (PROCARDIA XL/NIFEDICAL XL) 60 MG 24 hr tablet Take 1 tablet (60 mg total) by mouth daily. (Patient not taking: Reported on 03/13/2022) 30 tablet 5 Not Taking   OLANZapine (ZYPREXA) 20 MG tablet Take 20 mg by mouth at bedtime.      OLANZapine (ZYPREXA) 5 MG tablet Take 1 tablet (5 mg total) by mouth at bedtime. 30 tablet 5     Patient Stressors: Medication change or noncompliance    Patient Strengths: Motivation for treatment/growth   Treatment Modalities: Medication Management, Group therapy, Case management,  1 to 1 session with clinician, Psychoeducation, Recreational therapy.   Physician Treatment Plan for Primary Diagnosis: Schizoaffective  disorder, bipolar type (HCC) Long Term Goal(s): Improvement in symptoms so as ready for discharge   Short Term Goals: Compliance with prescribed medications will improve Ability to verbalize feelings will improve Ability to demonstrate self-control will improve  Medication Management: Evaluate patient's response, side effects, and tolerance of medication regimen.  Therapeutic Interventions: 1 to 1 sessions, Unit Group sessions and Medication administration.  Evaluation of Outcomes: Progressing  Physician Treatment Plan for Secondary Diagnosis: Principal Problem:   Schizoaffective disorder, bipolar type (HCC)  Long Term Goal(s): Improvement in symptoms so as ready for discharge   Short Term Goals: Compliance with prescribed medications will improve Ability to verbalize feelings will improve Ability to  demonstrate self-control will improve     Medication Management: Evaluate patient's response, side effects, and tolerance of medication regimen.  Therapeutic Interventions: 1 to 1 sessions, Unit Group sessions and Medication administration.  Evaluation of Outcomes: Progressing   RN Treatment Plan for Primary Diagnosis: Schizoaffective disorder, bipolar type (HCC) Long Term Goal(s): Knowledge of disease and therapeutic regimen to maintain health will improve  Short Term Goals: Ability to verbalize frustration and anger appropriately will improve, Ability to demonstrate self-control, Ability to participate in decision making will improve, Ability to verbalize feelings will improve, Ability to identify and develop effective coping behaviors will improve, and Compliance with prescribed medications will improve  Medication Management: RN will administer medications as ordered by provider, will assess and evaluate patient's response and provide education to patient for prescribed medication. RN will report any adverse and/or side effects to prescribing provider.  Therapeutic Interventions: 1 on 1 counseling sessions, Psychoeducation, Medication administration, Evaluate responses to treatment, Monitor vital signs and CBGs as ordered, Perform/monitor CIWA, COWS, AIMS and Fall Risk screenings as ordered, Perform wound care treatments as ordered.  Evaluation of Outcomes: Progressing   LCSW Treatment Plan for Primary Diagnosis: Schizoaffective disorder, bipolar type (HCC) Long Term Goal(s): Safe transition to appropriate next level of care at discharge, Engage patient in therapeutic group addressing interpersonal concerns.  Short Term Goals: Engage patient in aftercare planning with referrals and resources, Increase social support, Increase ability to appropriately verbalize feelings, Increase emotional regulation, Facilitate acceptance of mental health diagnosis and concerns, and Increase skills for  wellness and recovery  Therapeutic Interventions: Assess for all discharge needs, 1 to 1 time with Social worker, Explore available resources and support systems, Assess for adequacy in community support network, Educate family and significant other(s) on suicide prevention, Complete Psychosocial Assessment, Interpersonal group therapy.  Evaluation of Outcomes: Progressing   Progress in Treatment: Attending groups: No. Participating in groups: No. Taking medication as prescribed: Yes. Toleration medication: Yes. Family/Significant other contact made: Yes, individual(s) contacted:  SPE completed with the patient's significant other. Patient understands diagnosis: Yes. Discussing patient identified problems/goals with staff: Yes. Medical problems stabilized or resolved: Yes. Denies suicidal/homicidal ideation: Yes. Issues/concerns per patient self-inventory: No. Other: none  New problem(s) identified: No, Describe:  none  Update 03/29/2022: No changes at this time.    New Short Term/Long Term Goal(s): Patient to work towards detox, elimination of symptoms of psychosis, medication management for mood stabilization; elimination of SI thoughts; development of comprehensive mental wellness/sobriety plan.  Update 03/29/2022: No changes at this time.    Patient Goals:  No additional goals identified at this time. Patient to continue to work towards original goals identified in initial treatment team meeting. CSW will remain available to patient should they voice additional treatment goals. Update 03/29/2022: Patient continues to improve in behaviors and presentation  but continues to be somewhat delayed in her mannerisms.   Discharge Plan or Barriers: No psychosocial barriers identified at this time, patient to return to place of residence when appropriate for discharge. Update 03/29/2022: No changes at this time.    Reason for Continuation of Hospitalization: Other; describe psychosis    Estimated  Length of Stay: 1-7 days  Update 03/29/2022: No changes at this time.   Last 3 [place] Suicide Severity Risk Score: Flowsheet Row Admission (Current) from 03/13/2022 in [facility] INPATIENT BEHAVIORAL MEDICINE ED from 03/11/2022 in [facility] EMERGENCY DEPARTMENT ED from 07/20/2021 in [facility] EMERGENCY DEPARTMENT  C-SSRS RISK CATEGORY No Risk No Risk No Risk       Last PHQ 2/9 Scores:     No data to display          Scribe for Treatment Team: Harden Mo, Alexander Mt 03/29/2022 9:35 AM

## 2022-03-30 MED ORDER — LITHIUM CARBONATE ER 450 MG PO TBCR
450.0000 mg | EXTENDED_RELEASE_TABLET | Freq: Two times a day (BID) | ORAL | Status: DC
  Administered 2022-03-30 – 2022-04-03 (×8): 450 mg via ORAL
  Filled 2022-03-30 (×8): qty 1

## 2022-03-30 NOTE — Plan of Care (Signed)
  Problem: Health Behavior/Discharge Planning: Goal: Ability to manage health-related needs will improve Outcome: Progressing   Problem: Nutrition: Goal: Adequate nutrition will be maintained Outcome: Progressing   Problem: Coping: Goal: Level of anxiety will decrease Outcome: Progressing   Problem: Coping: Goal: Coping ability will improve Outcome: Progressing

## 2022-03-30 NOTE — Plan of Care (Signed)
Patient states that she had a good day. Patient pleasant on approach with moments of blunted affect at times. Denies SI & HI. Verbalized auditory hallucinations at times. Appetite and energy level good. Support and encouragement given.

## 2022-03-30 NOTE — Progress Notes (Signed)
[facility] MD Progress Note  03/30/2022 4:26 PM Alyssa Mathis  MRN:  [id number] Subjective: Follow-up for this 36 year old woman with bipolar disorder.  Patient seen and chart reviewed.  Once again she is in bed today reading the Bible but says that she is feeling better.  Smiles a little bit periods thoughts still seem to be sluggish and confused.  Admits to still having some hallucinations.  Lithium level came back from yesterday 0.67 Principal Problem: Schizoaffective disorder, bipolar type (HCC) Diagnosis: Principal Problem:   Schizoaffective disorder, bipolar type (HCC)  Total Time spent with patient: 30 minutes  Past Psychiatric History: Past history of recurrent psychotic episode likely bipolar  Past Medical History:  Past Medical History:  Diagnosis Date   Anemia    Hypertension    Post partum depression    Pregnancy induced hypertension    Schizoaffective disorder (HCC)     Past Surgical History:  Procedure Laterality Date   NO PAST SURGERIES     Family History:  Family History  Problem Relation Age of Onset   Hypertension Sister    Hypertension Maternal Grandmother    Diabetes Maternal Grandfather    Hypertension Maternal Grandfather    Family Psychiatric  History: See previous Social History:  Social History   Substance and Sexual Activity  Alcohol Use Never     Social History   Substance and Sexual Activity  Drug Use Not Currently    Social History   Socioeconomic History   Marital status: Single    Spouse name: Not on file   Number of children: Not on file   Years of education: Not on file   Highest education level: Not on file  Occupational History   Not on file  Tobacco Use   Smoking status: Every Day    Types: Cigarettes   Smokeless tobacco: Never  Vaping Use   Vaping Use: Not on file  Substance and Sexual Activity   Alcohol use: Never   Drug use: Not Currently   Sexual activity: Yes  Other Topics Concern   Not on file  Social History  Narrative   Not on file   Social Determinants of Health   Financial Resource Strain: Not on file  Food Insecurity: Not on file  Transportation Needs: Not on file  Physical Activity: Not on file  Stress: Not on file  Social Connections: Not on file   Additional Social History:                         Sleep: Fair  Appetite:  Fair  Current Medications: Current Facility-Administered Medications  Medication Dose Route Frequency Provider Last Rate Last Admin   acetaminophen (TYLENOL) tablet 650 mg  650 mg Oral Q6H PRN [name] F, NP   650 mg at 03/16/22 0840   alum & mag hydroxide-simeth (MAALOX/MYLANTA) 200-200-20 MG/5ML suspension 30 mL  30 mL Oral Q4H PRN [name] F, NP       benztropine (COGENTIN) tablet 0.5 mg  0.5 mg Oral BID [name], NP   0.5 mg at 03/30/22 1613   docusate sodium (COLACE) capsule 100 mg  100 mg Oral BID Larrie Lucia T, MD   100 mg at 03/30/22 1613   haloperidol (HALDOL) tablet 5 mg  5 mg Oral BID [name], NP   5 mg at 03/30/22 1600   hydrOXYzine (ATARAX) tablet 25 mg  25 mg Oral TID PRN [name], NP   25  mg at 03/27/22 0858   lithium carbonate (ESKALITH) CR tablet 450 mg  450 mg Oral Q12H Akasia Ahmad, [name], MD       LORazepam (ATIVAN) tablet 2 mg  2 mg Oral Q6H PRN Camden Mazzaferro T, MD   2 mg at 03/24/22 1304   Or   LORazepam (ATIVAN) injection 2 mg  2 mg Intramuscular Q6H PRN Domanick Cuccia T, MD       magnesium hydroxide (MILK OF MAGNESIA) suspension 30 mL  30 mL Oral Daily PRN [name] F, NP   30 mL at 03/15/22 0811   metoprolol succinate (TOPROL-XL) 24 hr tablet 25 mg  25 mg Oral Daily [name], NP   25 mg at 03/28/22 1018   nicotine polacrilex (NICORETTE) gum 2 mg  2 mg Oral PRN Sarina Ill, DO   2 mg at 03/29/22 1547   NIFEdipine (PROCARDIA-XL/NIFEDICAL-XL) 24 hr tablet 90 mg  90 mg Oral Daily Rodney Wigger, [name], MD   90 mg at 03/30/22 0820   OLANZapine (ZYPREXA) tablet 5 mg  5 mg Oral  Q6H PRN Ursula Dermody, [name], MD       OLANZapine zydis (ZYPREXA) disintegrating tablet 15 mg  15 mg Oral QHS Kenneshia Rehm T, MD   15 mg at 03/29/22 2348   senna-docusate (Senokot-S) tablet 1 tablet  1 tablet Oral Daily PRN Havoc Sanluis, [name], MD        Lab Results:  Results for orders placed or performed during the hospital encounter of 03/13/22 (from the past 48 hour(s))  Lithium level     Status: None   Collection Time: 03/29/22  2:52 PM  Result Value Ref Range   Lithium Lvl 0.67 0.60 - 1.20 mmol/L    Comment: Performed at [facility], [address] [place], [place] [address]    Blood Alcohol level:  Lab Results  Component Value Date   [facility] <10 03/12/2022   ETH <10 01/13/2021    Metabolic Disorder Labs: Lab Results  Component Value Date   HGBA1C 5.0 03/14/2022   MPG 97 03/14/2022   MPG 91 06/27/2020   No results found for: "PROLACTIN" Lab Results  Component Value Date   CHOL 143 03/14/2022   TRIG 40 03/14/2022   HDL 61 03/14/2022   CHOLHDL 2.3 03/14/2022   VLDL 8 03/14/2022   LDLCALC 74 03/14/2022   LDLCALC 82 06/27/2020    Physical Findings: AIMS:  , ,  ,  ,    CIWA:    COWS:     Musculoskeletal: Strength & Muscle Tone: within normal limits Gait & Station: normal Patient leans: N/A  Psychiatric Specialty Exam:  Presentation  General Appearance: Appropriate for Environment  Eye Contact:Good  Speech:Blocked (appears improved from earlier)  Speech Volume:Normal  Handedness:No data recorded  Mood and Affect  Mood:Dysphoric  Affect:Blunt   Thought Process  Thought Processes:Disorganized  Descriptions of Associations:No data recorded Orientation:Full (Time, Place and Person)  Thought Content:Scattered  History of Schizophrenia/Schizoaffective disorder:Yes  Duration of Psychotic Symptoms:Greater than six months  Hallucinations:No data recorded Ideas of Reference:Delusions  Suicidal Thoughts:No data recorded Homicidal Thoughts:No  data recorded  Sensorium  Memory:Immediate Poor  Judgment:Poor  Insight:Fair   Executive Functions  Concentration:Fair  Attention Span:Fair  Recall:Fair  Fund of Knowledge:Fair  Language:Fair   Psychomotor Activity  Psychomotor Activity:No data recorded  Assets  Assets:Resilience; Social Support; Housing; Intimacy; Financial Resources/Insurance   Sleep  Sleep:No data recorded   Physical Exam: Physical Exam Vitals and nursing  note reviewed.  Constitutional:      Appearance: Normal appearance.  HENT:     Head: Normocephalic and atraumatic.     Mouth/Throat:     Pharynx: Oropharynx is clear.  Eyes:     Pupils: Pupils are equal, round, and reactive to light.  Cardiovascular:     Rate and Rhythm: Normal rate and regular rhythm.  Pulmonary:     Effort: Pulmonary effort is normal.     Breath sounds: Normal breath sounds.  Abdominal:     General: Abdomen is flat.     Palpations: Abdomen is soft.  Musculoskeletal:        General: Normal range of motion.  Skin:    General: Skin is warm and dry.  Neurological:     General: No focal deficit present.     Mental Status: She is alert. Mental status is at baseline.  Psychiatric:        Attention and Perception: She is inattentive.        Mood and Affect: Mood normal. Affect is blunt.        Speech: Speech is delayed.        Thought Content: Thought content normal.    Review of Systems  Constitutional: Negative.   HENT: Negative.    Eyes: Negative.   Respiratory: Negative.    Cardiovascular: Negative.   Gastrointestinal: Negative.   Musculoskeletal: Negative.   Skin: Negative.   Neurological: Negative.   Psychiatric/Behavioral:  Positive for hallucinations.    Blood pressure 118/83, pulse 67, temperature 98.2 F (36.8 C), temperature source Oral, resp. rate 18, height 5\' 5"  (1.651 m), weight 72.6 kg, SpO2 99 %, unknown if currently breastfeeding. Body mass index is 26.63 kg/m.   Treatment Plan  Summary: Plan patient is on antipsychotic medicine and now on lithium.  I am increasing the dose to 450 mg twice a day because of the blood level results.  Advised patient of this and also reviewed side effects and told her to let know if she is sick to her stomach dizzy or feeling confused.  Reassured her that I think things are gradually getting better.  I tried to speak with her long-term companion [name] today and ended up leaving a voicemail message.  [name], MD 03/30/2022, 4:26 PM

## 2022-03-30 NOTE — Progress Notes (Signed)
Patient has finally awaken. Provided snack and qhs medication. She is pleasant but appears occupied. She denies si/hi/vh. Does endorse hearing voices. Can not gather he words to describe when staff inquired. She also denies anxiety and depression at this encounter. Will continue to monitor. Encouraged patient to coe to staff with any concerns.  Q15 minute safety checks in place.      C Butler-Nicholson, LPN

## 2022-03-30 NOTE — Progress Notes (Signed)
Recreation Therapy Notes  Date: 03/30/2022   Time: 10:40 am     Location: Craft room         Behavioral response: N/A   Intervention Topic: Values  Discussion/Intervention: Patient refused to attend group.    Clinical Observations/Feedback:  Patient refused to attend group.    Tawnee Clegg LRT/CTRS        Porter Nakama 03/30/2022 11:03 AM

## 2022-03-30 NOTE — Progress Notes (Addendum)
HR 57.Metoprolol not given. Made MD aware.Patient appears pleasant, noted delay in response. Patient states that she is hearing voices but don't know what it is. Denies SI,HI and VH. Support and encouragement given.

## 2022-03-30 NOTE — Group Note (Signed)
[facility] LCSW Group Therapy Note   Group Date: 03/30/2022 Start Time: 1300 End Time: 1400  Type of Therapy and Topic:  Group Therapy:  Feelings around Relapse and Recovery  Participation Level:  Did Not Attend    Description of Group:    Patients in this group will discuss emotions they experience before and after a relapse. They will process how experiencing these feelings, or avoidance of experiencing them, relates to having a relapse. Facilitator will guide patients to explore emotions they have related to recovery. Patients will be encouraged to process which emotions are more powerful. They will be guided to discuss the emotional reaction significant others in their lives may have to patients' relapse or recovery. Patients will be assisted in exploring ways to respond to the emotions of others without this contributing to a relapse.  Therapeutic Goals: Patient will identify two or more emotions that lead to relapse for them:  Patient will identify two emotions that result when they relapse:  Patient will identify two emotions related to recovery:  Patient will demonstrate ability to communicate their needs through discussion and/or role plays.   Summary of Patient Progress: X   Therapeutic Modalities:   Cognitive Behavioral Therapy Solution-Focused Therapy Assertiveness Training Relapse Prevention Therapy   Nelwyn Hebdon R Azari Janssens, LCSW 

## 2022-03-31 NOTE — Progress Notes (Signed)
[facility] MD Progress Note  03/31/2022 8:25 AM Alyssa Mathis  MRN:  [id number] Subjective:   Pt was seen for initial psychiatric evaluation on March 13, 2022, brought into the emergency department due to severe decline in functioning and an exacerbation of her mental health, as specifically schizoaffective disorder.  Patient reports subjective improvement.  "I am not confused anymore."  She denies paranoia.  Denies various forms of hallucinations including auditory visual olfactory and tactile.  States that she usually experiences auditory hallucinations in the evening and nighttime but "I did not hear anything last night."    She denies any particular stressors.  Does take some naps during the day and denies excessive daytime sedation.  "This was an eye-opener for me."  She says that the her medications were working but was doing very well and eventually just quit.  She now realizes the importance of taking her meds.  Principal Problem: Schizoaffective disorder, bipolar type (HCC) Diagnosis: Principal Problem:   Schizoaffective disorder, bipolar type (HCC)  Total Time spent with patient: 33 minutes  Past Psychiatric History: Past history of recurrent psychotic episode likely bipolar  Past Medical History:  Past Medical History:  Diagnosis Date   Anemia    Hypertension    Post partum depression    Pregnancy induced hypertension    Schizoaffective disorder (HCC)     Past Surgical History:  Procedure Laterality Date   NO PAST SURGERIES     Family History:  Family History  Problem Relation Age of Onset   Hypertension Sister    Hypertension Maternal Grandmother    Diabetes Maternal Grandfather    Hypertension Maternal Grandfather    Family Psychiatric  History: See previous Social History:  Social History   Substance and Sexual Activity  Alcohol Use Never     Social History   Substance and Sexual Activity  Drug Use Not Currently    Social History   Socioeconomic History    Marital status: Single    Spouse name: Not on file   Number of children: Not on file   Years of education: Not on file   Highest education level: Not on file  Occupational History   Not on file  Tobacco Use   Smoking status: Every Day    Types: Cigarettes   Smokeless tobacco: Never  Vaping Use   Vaping Use: Not on file  Substance and Sexual Activity   Alcohol use: Never   Drug use: Not Currently   Sexual activity: Yes  Other Topics Concern   Not on file  Social History Narrative   Not on file   Social Determinants of Health   Financial Resource Strain: Not on file  Food Insecurity: Not on file  Transportation Needs: Not on file  Physical Activity: Not on file  Stress: Not on file  Social Connections: Not on file   Additional Social History:                         Sleep: Fair  Appetite:  Fair  Current Medications: Current Facility-Administered Medications  Medication Dose Route Frequency Provider Last Rate Last Admin   acetaminophen (TYLENOL) tablet 650 mg  650 mg Oral Q6H PRN [name] F, NP   650 mg at 03/16/22 0840   alum & mag hydroxide-simeth (MAALOX/MYLANTA) 200-200-20 MG/5ML suspension 30 mL  30 mL Oral Q4H PRN [name] F, NP       benztropine (COGENTIN) tablet 0.5 mg  0.5 mg Oral  BID [name], NP   0.5 mg at 03/31/22 6269   docusate sodium (COLACE) capsule 100 mg  100 mg Oral BID [name], [name], MD   100 mg at 03/31/22 0817   haloperidol (HALDOL) tablet 5 mg  5 mg Oral BID [name], NP   5 mg at 03/31/22 4854   hydrOXYzine (ATARAX) tablet 25 mg  25 mg Oral TID PRN [name], NP   25 mg at 03/27/22 0858   lithium carbonate (ESKALITH) CR tablet 450 mg  450 mg Oral Q12H [name], [name], MD   450 mg at 03/31/22 0817   LORazepam (ATIVAN) tablet 2 mg  2 mg Oral Q6H PRN [name], [name], MD   2 mg at 03/24/22 1304   Or   LORazepam (ATIVAN) injection 2 mg  2 mg Intramuscular Q6H PRN [name], [name], MD        magnesium hydroxide (MILK OF MAGNESIA) suspension 30 mL  30 mL Oral Daily PRN [name] F, NP   30 mL at 03/15/22 0811   metoprolol succinate (TOPROL-XL) 24 hr tablet 25 mg  25 mg Oral Daily [name], NP   25 mg at 03/31/22 0818   nicotine polacrilex (NICORETTE) gum 2 mg  2 mg Oral PRN Sarina Ill, DO   2 mg at 03/29/22 1547   NIFEdipine (PROCARDIA-XL/NIFEDICAL-XL) 24 hr tablet 90 mg  90 mg Oral Daily [name], [name], MD   90 mg at 03/31/22 0818   OLANZapine (ZYPREXA) tablet 5 mg  5 mg Oral Q6H PRN [name], [name], MD       OLANZapine zydis (ZYPREXA) disintegrating tablet 15 mg  15 mg Oral QHS [name], [name], MD   15 mg at 03/30/22 2259   senna-docusate (Senokot-S) tablet 1 tablet  1 tablet Oral Daily PRN [name], [name], MD        Lab Results:  Results for orders placed or performed during the hospital encounter of 03/13/22 (from the past 48 hour(s))  Lithium level     Status: None   Collection Time: 03/29/22  2:52 PM  Result Value Ref Range   Lithium Lvl 0.67 0.60 - 1.20 mmol/L    Comment: Performed at [facility], [address] [place], [place] [address]    Blood Alcohol level:  Lab Results  Component Value Date   [facility] <10 03/12/2022   ETH <10 01/13/2021    Metabolic Disorder Labs: Lab Results  Component Value Date   HGBA1C 5.0 03/14/2022   MPG 97 03/14/2022   MPG 91 06/27/2020   No results found for: "PROLACTIN" Lab Results  Component Value Date   CHOL 143 03/14/2022   TRIG 40 03/14/2022   HDL 61 03/14/2022   CHOLHDL 2.3 03/14/2022   VLDL 8 03/14/2022   LDLCALC 74 03/14/2022   LDLCALC 82 06/27/2020    Physical Findings: AIMS:  , ,  ,  ,    CIWA:    COWS:     Musculoskeletal: Strength & Muscle Tone: within normal limits Gait & Station: normal Patient leans: N/A  Psychiatric Specialty Exam:  Presentation  General Appearance: Appropriate for Environment  Eye Contact:Good  Speech:Blocked (appears improved from  earlier)  Speech Volume:Normal  Handedness:No data recorded  Mood and Affect  Mood:Dysphoric  Affect:Blunt   Thought Process  Thought Processes:Disorganized  Descriptions of Associations:No data recorded Orientation:Full (Time, Place and Person)  Thought Content:Scattered  History of Schizophrenia/Schizoaffective disorder:Yes  Duration of  Psychotic Symptoms:Greater than six months  Hallucinations:No data recorded Ideas of Reference:Delusions  Suicidal Thoughts:No data recorded Homicidal Thoughts:No data recorded  Sensorium  Memory:Immediate Poor  Judgment:Poor  Insight:Fair   Executive Functions  Concentration:Fair  Attention Span:Fair  Recall:Fair  Fund of Knowledge:Fair  Language:Fair   Psychomotor Activity  Psychomotor Activity:No data recorded  Assets  Assets:Resilience; Social Support; Housing; Intimacy; Financial Resources/Insurance   Sleep  Sleep:No data recorded   Physical Exam: Physical Exam Vitals and nursing note reviewed.  Constitutional:      Appearance: Normal appearance.  HENT:     Head: Normocephalic and atraumatic.     Mouth/Throat:     Pharynx: Oropharynx is clear.  Eyes:     Pupils: Pupils are equal, round, and reactive to light.  Cardiovascular:     Rate and Rhythm: Normal rate and regular rhythm.  Pulmonary:     Effort: Pulmonary effort is normal.     Breath sounds: Normal breath sounds.  Abdominal:     General: Abdomen is flat.     Palpations: Abdomen is soft.  Musculoskeletal:        General: Normal range of motion.  Skin:    General: Skin is warm and dry.  Neurological:     General: No focal deficit present.     Mental Status: She is alert. Mental status is at baseline.  Psychiatric:        Attention and Perception: She is inattentive.        Mood and Affect: Mood normal. Affect is blunt.        Speech: Speech is delayed.        Thought Content: Thought content normal.    Review of Systems   Constitutional: Negative.   HENT: Negative.    Eyes: Negative.   Respiratory: Negative.    Cardiovascular: Negative.   Gastrointestinal: Negative.   Musculoskeletal: Negative.   Skin: Negative.   Neurological: Negative.   Psychiatric/Behavioral:  Positive for hallucinations.    Blood pressure (!) 127/100, pulse 78, temperature 98.7 F (37.1 C), temperature source Oral, resp. rate 18, height 5\' 5"  (1.651 m), weight 72.6 kg, SpO2 100 %, unknown if currently breastfeeding. Body mass index is 26.63 kg/m.   Treatment Plan Summary: Plan patient is on antipsychotic medicine and now on lithium.  I am increasing the dose to 450 mg twice a day because of the blood level results.  Advised patient of this and also reviewed side effects and told her to let know if she is sick to her stomach dizzy or feeling confused.  Reassured her that I think things are gradually getting better.  I tried to speak with her long-term companion [name] today and ended up leaving a voicemail message.  7/15 No changes   8/15, MD 03/31/2022, 8:25 AM

## 2022-03-31 NOTE — Plan of Care (Signed)
Patient pleasant and more appropriate with staff & peers today. Patient able to makes logical conversation. No thought blocking or blunted affect noted.States that she is going to stay on her medicines upon discharge. Denies SI,HI and AVH. Appetite and energy level good. ADLs maintained. Patient washed her cloths. Support and encouragement noted.

## 2022-03-31 NOTE — Progress Notes (Signed)
Patient appears responding to internal stimuli , staring at staff inappropriately. Patient was frequently redirected, no distress  15 minutes safety checks maintained.  

## 2022-04-01 NOTE — Progress Notes (Signed)
Patient alert an oriented x 4 denies SI/HI/AVH 15 minutes safety checks maintained.

## 2022-04-01 NOTE — Plan of Care (Signed)
Pt calm and cooperative, compliant with all medications, affect and concentration improved over the course of the shift.  Pt states that she understands how important it is to take her medications at home and says she has to do it for her children.  Denies SI, HI, AVH.

## 2022-04-01 NOTE — Progress Notes (Signed)
[facility] MD Progress Note  04/01/2022 8:24 AM Alyssa Mathis  MRN:  [id number] Subjective:  7/16 Patient smiles brightly and easily this morning during the interview.  She was seen in her room with her Bible open.  Tells me that God speaks to her through scripture, but not in a psychotic way.  Denies auditory hallucinations or confusions.  Denies depression and anxiety or irritability.  She slept well.  Staff and nursing believe that she is doing better.  No side effects on her medication.  She is no longer responding to internal stimuli.  7/15 Pt was seen for initial psychiatric evaluation on March 13, 2022, brought into the emergency department due to severe decline in functioning and an exacerbation of her mental health, as specifically schizoaffective disorder.  Patient reports subjective improvement.  "I am not confused anymore."  She denies paranoia.  Denies various forms of hallucinations including auditory visual olfactory and tactile.  States that she usually experiences auditory hallucinations in the evening and nighttime but "I did not hear anything last night."    She denies any particular stressors.  Does take some naps during the day and denies excessive daytime sedation.  "This was an eye-opener for me."  She says that the her medications were working but was doing very well and eventually just quit.  She now realizes the importance of taking her meds.  Principal Problem: Schizoaffective disorder, bipolar type (HCC) Diagnosis: Principal Problem:   Schizoaffective disorder, bipolar type (HCC)  Total Time spent with patient: 29 minutes  Past Psychiatric History: Past history of recurrent psychotic episode likely bipolar  Past Medical History:  Past Medical History:  Diagnosis Date   Anemia    Hypertension    Post partum depression    Pregnancy induced hypertension    Schizoaffective disorder (HCC)     Past Surgical History:  Procedure Laterality Date   NO PAST SURGERIES      Family History:  Family History  Problem Relation Age of Onset   Hypertension Sister    Hypertension Maternal Grandmother    Diabetes Maternal Grandfather    Hypertension Maternal Grandfather    Family Psychiatric  History: See previous Social History:  Social History   Substance and Sexual Activity  Alcohol Use Never     Social History   Substance and Sexual Activity  Drug Use Not Currently    Social History   Socioeconomic History   Marital status: Single    Spouse name: Not on file   Number of children: Not on file   Years of education: Not on file   Highest education level: Not on file  Occupational History   Not on file  Tobacco Use   Smoking status: Every Day    Types: Cigarettes   Smokeless tobacco: Never  Vaping Use   Vaping Use: Not on file  Substance and Sexual Activity   Alcohol use: Never   Drug use: Not Currently   Sexual activity: Yes  Other Topics Concern   Not on file  Social History Narrative   Not on file   Social Determinants of Health   Financial Resource Strain: Not on file  Food Insecurity: Not on file  Transportation Needs: Not on file  Physical Activity: Not on file  Stress: Not on file  Social Connections: Not on file   Additional Social History:                         Sleep:  Fair  Appetite:  Fair  Current Medications: Current Facility-Administered Medications  Medication Dose Route Frequency Provider Last Rate Last Admin   acetaminophen (TYLENOL) tablet 650 mg  650 mg Oral Q6H PRN [name] F, NP   650 mg at 03/16/22 0840   alum & mag hydroxide-simeth (MAALOX/MYLANTA) 200-200-20 MG/5ML suspension 30 mL  30 mL Oral Q4H PRN [name] F, NP       benztropine (COGENTIN) tablet 0.5 mg  0.5 mg Oral BID [name], NP   0.5 mg at 04/01/22 4650   docusate sodium (COLACE) capsule 100 mg  100 mg Oral BID [name], [name], MD   100 mg at 04/01/22 3546   haloperidol (HALDOL) tablet 5 mg  5 mg Oral BID  [name], NP   5 mg at 04/01/22 5681   hydrOXYzine (ATARAX) tablet 25 mg  25 mg Oral TID PRN [name], NP   25 mg at 03/27/22 0858   lithium carbonate (ESKALITH) CR tablet 450 mg  450 mg Oral Q12H [name], [name], MD   450 mg at 04/01/22 0820   LORazepam (ATIVAN) tablet 2 mg  2 mg Oral Q6H PRN [name], [name], MD   2 mg at 03/24/22 1304   Or   LORazepam (ATIVAN) injection 2 mg  2 mg Intramuscular Q6H PRN [name], [name], MD       magnesium hydroxide (MILK OF MAGNESIA) suspension 30 mL  30 mL Oral Daily PRN [name] F, NP   30 mL at 03/15/22 0811   metoprolol succinate (TOPROL-XL) 24 hr tablet 25 mg  25 mg Oral Daily [name], NP   25 mg at 04/01/22 2751   nicotine polacrilex (NICORETTE) gum 2 mg  2 mg Oral PRN Sarina Ill, DO   2 mg at 03/29/22 1547   NIFEdipine (PROCARDIA-XL/NIFEDICAL-XL) 24 hr tablet 90 mg  90 mg Oral Daily [name], [name], MD   90 mg at 04/01/22 0820   OLANZapine (ZYPREXA) tablet 5 mg  5 mg Oral Q6H PRN [name], [name], MD       OLANZapine zydis (ZYPREXA) disintegrating tablet 15 mg  15 mg Oral QHS [name], [name], MD   15 mg at 03/31/22 2107   senna-docusate (Senokot-S) tablet 1 tablet  1 tablet Oral Daily PRN [name], [name], MD        Lab Results:  No results found for this or any previous visit (from the past 48 hour(s)).   Blood Alcohol level:  Lab Results  Component Value Date   ETH <10 03/12/2022   ETH <10 01/13/2021    Metabolic Disorder Labs: Lab Results  Component Value Date   HGBA1C 5.0 03/14/2022   MPG 97 03/14/2022   MPG 91 06/27/2020   No results found for: "PROLACTIN" Lab Results  Component Value Date   CHOL 143 03/14/2022   TRIG 40 03/14/2022   HDL 61 03/14/2022   CHOLHDL 2.3 03/14/2022   VLDL 8 03/14/2022   LDLCALC 74 03/14/2022   LDLCALC 82 06/27/2020    Physical Findings: AIMS:  , ,  ,  ,    CIWA:    COWS:     Musculoskeletal: Strength & Muscle Tone: within normal limits Gait &  Station: normal Patient leans: N/A  Psychiatric Specialty Exam:  Presentation  General Appearance: Appropriate for Environment  Eye Contact:Good  Speech:wnl Speech Volume:Normal  Handedness:No data recorded  Mood and Affect  Mood:good Affect:bright  Thought Process  Thought Processes:Disorganized  Descriptions of Associations:No data recorded Orientation:Full (Time, Place and Person)  Thought Content:Scattered  History of Schizophrenia/Schizoaffective disorder:Yes  Duration of Psychotic Symptoms:Greater than six months  Hallucinations:No data recorded Ideas of Reference:Delusions  Suicidal Thoughts:No data recorded Homicidal Thoughts:No data recorded  Sensorium  Memory:Immediate Poor  Judgment:good Insight:Fair   Executive Functions  Concentration:Fair  Attention Span:Fair  Recall:Fair  Fund of Knowledge:Fair  Language:Fair   Psychomotor Activity  Psychomotor Activity:No data recorded  Assets  Assets:   Blood pressure 127/87, pulse 68, temperature 98.6 F (37 C), temperature source Oral, resp. rate 18, height 5\' 5"  (1.651 m), weight 72.6 kg, SpO2 100 %, unknown if currently breastfeeding. Body mass index is 26.63 kg/m.   Treatment Plan Summary: Plan patient is on antipsychotic medicine and now on lithium.  I am increasing the dose to 450 mg twice a day because of the blood level results.  Advised patient of this and also reviewed side effects and told her to let know if she is sick to her stomach dizzy or feeling confused.  Reassured her that I think things are gradually getting better.  I tried to speak with her long-term companion [name] today and ended up leaving a voicemail message.  7/15 No changes  7/16 No changes  8/16, MD 04/01/2022, 8:24 AM

## 2022-04-02 ENCOUNTER — Other Ambulatory Visit: Payer: Self-pay

## 2022-04-02 MED ORDER — LITHIUM CARBONATE ER 450 MG PO TBCR
450.0000 mg | EXTENDED_RELEASE_TABLET | Freq: Two times a day (BID) | ORAL | 0 refills | Status: DC
  Filled 2022-04-02: qty 20, 10d supply, fill #0

## 2022-04-02 MED ORDER — OLANZAPINE 15 MG PO TABS
15.0000 mg | ORAL_TABLET | Freq: Every day | ORAL | 0 refills | Status: DC
  Filled 2022-04-02 (×2): qty 10, 10d supply, fill #0

## 2022-04-02 MED ORDER — NICOTINE POLACRILEX 2 MG MT GUM
2.0000 mg | CHEWING_GUM | OROMUCOSAL | 0 refills | Status: DC | PRN
  Filled 2022-04-02: qty 110, 30d supply, fill #0

## 2022-04-02 MED ORDER — METOPROLOL SUCCINATE ER 25 MG PO TB24
25.0000 mg | ORAL_TABLET | Freq: Every day | ORAL | 0 refills | Status: DC
  Filled 2022-04-02: qty 10, 10d supply, fill #0

## 2022-04-02 MED ORDER — HYDROXYZINE HCL 25 MG PO TABS: 25.0000 mg | ORAL_TABLET | Freq: Three times a day (TID) | ORAL | 1 refills | Status: DC | PRN

## 2022-04-02 MED ORDER — HALOPERIDOL 5 MG PO TABS: 5.0000 mg | ORAL_TABLET | Freq: Two times a day (BID) | ORAL | 1 refills | Status: DC

## 2022-04-02 MED ORDER — BENZTROPINE MESYLATE 0.5 MG PO TABS
0.5000 mg | ORAL_TABLET | Freq: Two times a day (BID) | ORAL | 0 refills | Status: DC
  Filled 2022-04-02: qty 20, 10d supply, fill #0

## 2022-04-02 MED ORDER — LITHIUM CARBONATE ER 450 MG PO TBCR: 450.0000 mg | EXTENDED_RELEASE_TABLET | Freq: Two times a day (BID) | ORAL | 1 refills | Status: DC

## 2022-04-02 MED ORDER — NIFEDIPINE ER OSMOTIC RELEASE 90 MG PO TB24: 90.0000 mg | ORAL_TABLET | Freq: Every day | ORAL | 1 refills | Status: DC

## 2022-04-02 MED ORDER — BENZTROPINE MESYLATE 0.5 MG PO TABS: 0.5000 mg | ORAL_TABLET | Freq: Two times a day (BID) | ORAL | 1 refills | Status: DC

## 2022-04-02 MED ORDER — DOCUSATE SODIUM 100 MG PO CAPS: 100.0000 mg | ORAL_CAPSULE | Freq: Two times a day (BID) | ORAL | 1 refills | Status: DC

## 2022-04-02 MED ORDER — NIFEDIPINE ER OSMOTIC RELEASE 90 MG PO TB24
90.0000 mg | ORAL_TABLET | Freq: Every day | ORAL | 0 refills | Status: DC
  Filled 2022-04-02: qty 10, 10d supply, fill #0

## 2022-04-02 MED ORDER — HYDROXYZINE HCL 25 MG PO TABS
25.0000 mg | ORAL_TABLET | Freq: Three times a day (TID) | ORAL | 0 refills | Status: DC | PRN
  Filled 2022-04-02: qty 20, 7d supply, fill #0

## 2022-04-02 MED ORDER — DOCUSATE SODIUM 100 MG PO CAPS
100.0000 mg | ORAL_CAPSULE | Freq: Two times a day (BID) | ORAL | 0 refills | Status: DC
  Filled 2022-04-02: qty 20, 10d supply, fill #0

## 2022-04-02 MED ORDER — NICOTINE POLACRILEX 2 MG MT GUM: 2.0000 mg | CHEWING_GUM | OROMUCOSAL | 0 refills | Status: DC | PRN

## 2022-04-02 MED ORDER — HALOPERIDOL 5 MG PO TABS
5.0000 mg | ORAL_TABLET | Freq: Two times a day (BID) | ORAL | 0 refills | Status: DC
  Filled 2022-04-02: qty 20, 10d supply, fill #0

## 2022-04-02 MED ORDER — METOPROLOL SUCCINATE ER 25 MG PO TB24: 25.0000 mg | ORAL_TABLET | Freq: Every day | ORAL | 1 refills | Status: DC

## 2022-04-02 MED ORDER — OLANZAPINE 15 MG PO TBDP: 15.0000 mg | ORAL_TABLET | Freq: Every day | ORAL | 1 refills | Status: DC

## 2022-04-02 NOTE — Plan of Care (Signed)
Patient pleasant  and cooperative on approach. Patient able to make a logical conversation with staff about her discharge plans.Denies SI,HI and AVH. ADLs maintained. Appetite and energy level good. Looking forward for discharge tomorrow. Support and encouragement given.

## 2022-04-02 NOTE — Progress Notes (Signed)
Recreation Therapy Notes    Date: 04/02/2022   Time: 10:05 am     Location: Court yard       Behavioral response: N/A   Intervention Topic: Communication    Discussion/Intervention: Patient refused to attend group.    Clinical Observations/Feedback:  Patient refused to attend group.    Jamarian Jacinto LRT/CTRS          Florentina Marquart 04/02/2022 10:22 AM 

## 2022-04-02 NOTE — Progress Notes (Signed)
Patient pleasant and cooperative. Excited about going home on tomorrow. Denies SI, HI, AVh. Medication compliant. Appropriate with staff and peers. Encouragement and support provided. Safety checks maintained. Medications given as prescribed. Pt receptive and remains safe on unit.

## 2022-04-02 NOTE — Group Note (Signed)
[place] LCSW Group Therapy Note    Group Date: 04/02/2022 Start Time: 1300 End Time: 1400  Type of Therapy and Topic:  Group Therapy:  Overcoming Obstacles  Participation Level:  [facility] PARTICIPATION LEVEL: Active  Mood:  Description of Group:   In this group patients will be encouraged to explore what they see as obstacles to their own wellness and recovery. They will be guided to discuss their thoughts, feelings, and behaviors related to these obstacles. The group will process together ways to cope with barriers, with attention given to specific choices patients can make. Each patient will be challenged to identify changes they are motivated to make in order to overcome their obstacles. This group will be process-oriented, with patients participating in exploration of their own experiences as well as giving and receiving support and challenge from other group members.  Therapeutic Goals: 1. Patient will identify personal and current obstacles as they relate to admission. 2. Patient will identify barriers that currently interfere with their wellness or overcoming obstacles.  3. Patient will identify feelings, thought process and behaviors related to these barriers. 4. Patient will identify two changes they are willing to make to overcome these obstacles:    Summary of Patient Progress Patient was present in group.  Patient was an active participant.  Patient focused on how the cost of inflation has gone up and the cost of living is high.  Patient related this to the current political climate.  Patient did appear to struggle with processing certain thouhgts.     Therapeutic Modalities:   Cognitive Behavioral Therapy Solution Focused Therapy Motivational Interviewing Relapse Prevention Therapy   Harden Mo, LCSW

## 2022-04-02 NOTE — Progress Notes (Signed)
Patient alert and oriented x 4, affect is flat but brightens upon approach, she denies SI/HI/AVH interacting appropriately with peers and staff, 15 minutes safety checks maintained will continue to monitor.

## 2022-04-02 NOTE — Plan of Care (Signed)
  Problem: Education: Goal: Knowledge of General Education information will improve Description: Including pain rating scale, medication(s)/side effects and non-pharmacologic comfort measures Outcome: Progressing   Problem: Health Behavior/Discharge Planning: Goal: Ability to manage health-related needs will improve Outcome: Progressing   Problem: Clinical Measurements: Goal: Ability to maintain clinical measurements within normal limits will improve Outcome: Progressing Goal: Will remain free from infection Outcome: Progressing Goal: Diagnostic test results will improve Outcome: Progressing Goal: Respiratory complications will improve Outcome: Progressing   Problem: Coping: Goal: Level of anxiety will decrease Outcome: Progressing   Problem: Safety: Goal: Ability to remain free from injury will improve Outcome: Progressing

## 2022-04-02 NOTE — Progress Notes (Signed)
[facility] MD Progress Note  04/02/2022 12:04 PM Alyssa Mathis  MRN:  [id number] Subjective: Follow-up 36 year old woman with schizoaffective disorder.  Patient seen today and appears to be much improved.  She was able to hold a lucid conversation with good eye contact and appropriate affect.  States that she is feeling better.  Denies hallucinations.  Seems much more engaged and attentive.  Continues to have good hygiene and personal care.  Showed improved insight into illness and willingness to be compliant with outpatient treatment Principal Problem: Schizoaffective disorder, bipolar type (HCC) Diagnosis: Principal Problem:   Schizoaffective disorder, bipolar type (HCC)  Total Time spent with patient: 30 minutes  Past Psychiatric History: Past history of longstanding intermittent episodes of psychosis  Past Medical History:  Past Medical History:  Diagnosis Date   Anemia    Hypertension    Post partum depression    Pregnancy induced hypertension    Schizoaffective disorder (HCC)     Past Surgical History:  Procedure Laterality Date   NO PAST SURGERIES     Family History:  Family History  Problem Relation Age of Onset   Hypertension Sister    Hypertension Maternal Grandmother    Diabetes Maternal Grandfather    Hypertension Maternal Grandfather    Family Psychiatric  History: See previous Social History:  Social History   Substance and Sexual Activity  Alcohol Use Never     Social History   Substance and Sexual Activity  Drug Use Not Currently    Social History   Socioeconomic History   Marital status: Single    Spouse name: Not on file   Number of children: Not on file   Years of education: Not on file   Highest education level: Not on file  Occupational History   Not on file  Tobacco Use   Smoking status: Every Day    Types: Cigarettes   Smokeless tobacco: Never  Vaping Use   Vaping Use: Not on file  Substance and Sexual Activity   Alcohol use: Never   Drug  use: Not Currently   Sexual activity: Yes  Other Topics Concern   Not on file  Social History Narrative   Not on file   Social Determinants of Health   Financial Resource Strain: Not on file  Food Insecurity: Not on file  Transportation Needs: Not on file  Physical Activity: Not on file  Stress: Not on file  Social Connections: Not on file   Additional Social History:                         Sleep: Fair  Appetite:  Fair  Current Medications: Current Facility-Administered Medications  Medication Dose Route Frequency Provider Last Rate Last Admin   acetaminophen (TYLENOL) tablet 650 mg  650 mg Oral Q6H PRN [name] F, NP   650 mg at 03/16/22 0840   alum & mag hydroxide-simeth (MAALOX/MYLANTA) 200-200-20 MG/5ML suspension 30 mL  30 mL Oral Q4H PRN [name] F, NP       benztropine (COGENTIN) tablet 0.5 mg  0.5 mg Oral BID [name], NP   0.5 mg at 04/02/22 0803   docusate sodium (COLACE) capsule 100 mg  100 mg Oral BID Shavaughn Seidl T, MD   100 mg at 04/02/22 0803   haloperidol (HALDOL) tablet 5 mg  5 mg Oral BID [name], NP   5 mg at 04/02/22 0803   hydrOXYzine (ATARAX) tablet 25 mg  25  mg Oral TID PRN [name], NP   25 mg at 03/27/22 0858   lithium carbonate (ESKALITH) CR tablet 450 mg  450 mg Oral Q12H Katerine Morua, [name], MD   450 mg at 04/02/22 0803   LORazepam (ATIVAN) tablet 2 mg  2 mg Oral Q6H PRN Naveh Rickles T, MD   2 mg at 03/24/22 1304   Or   LORazepam (ATIVAN) injection 2 mg  2 mg Intramuscular Q6H PRN Osama Coleson T, MD       magnesium hydroxide (MILK OF MAGNESIA) suspension 30 mL  30 mL Oral Daily PRN [name] F, NP   30 mL at 03/15/22 0811   metoprolol succinate (TOPROL-XL) 24 hr tablet 25 mg  25 mg Oral Daily [name], NP   25 mg at 04/02/22 9381   nicotine polacrilex (NICORETTE) gum 2 mg  2 mg Oral PRN Sarina Ill, DO   2 mg at 04/01/22 1653   NIFEdipine (PROCARDIA-XL/NIFEDICAL-XL) 24 hr  tablet 90 mg  90 mg Oral Daily Alayshia Marini, [name], MD   90 mg at 04/02/22 0804   OLANZapine (ZYPREXA) tablet 5 mg  5 mg Oral Q6H PRN Sherley Mckenney, [name], MD       OLANZapine zydis (ZYPREXA) disintegrating tablet 15 mg  15 mg Oral QHS Caddie Randle T, MD   15 mg at 04/01/22 2125   senna-docusate (Senokot-S) tablet 1 tablet  1 tablet Oral Daily PRN Seniah Lawrence, [name], MD        Lab Results: No results found for this or any previous visit (from the past 48 hour(s)).  Blood Alcohol level:  Lab Results  Component Value Date   ETH <10 03/12/2022   ETH <10 01/13/2021    Metabolic Disorder Labs: Lab Results  Component Value Date   HGBA1C 5.0 03/14/2022   MPG 97 03/14/2022   MPG 91 06/27/2020   No results found for: "PROLACTIN" Lab Results  Component Value Date   CHOL 143 03/14/2022   TRIG 40 03/14/2022   HDL 61 03/14/2022   CHOLHDL 2.3 03/14/2022   VLDL 8 03/14/2022   LDLCALC 74 03/14/2022   LDLCALC 82 06/27/2020    Physical Findings: AIMS:  , ,  ,  ,    CIWA:    COWS:     Musculoskeletal: Strength & Muscle Tone: within normal limits Gait & Station: normal Patient leans: N/A  Psychiatric Specialty Exam:  Presentation  General Appearance: Appropriate for Environment  Eye Contact:Good  Speech:Blocked (appears improved from earlier)  Speech Volume:Normal  Handedness:No data recorded  Mood and Affect  Mood:Dysphoric  Affect:Blunt   Thought Process  Thought Processes:Disorganized  Descriptions of Associations:No data recorded Orientation:Full (Time, Place and Person)  Thought Content:Scattered  History of Schizophrenia/Schizoaffective disorder:Yes  Duration of Psychotic Symptoms:Greater than six months  Hallucinations:No data recorded Ideas of Reference:Delusions  Suicidal Thoughts:No data recorded Homicidal Thoughts:No data recorded  Sensorium  Memory:Immediate Poor  Judgment:Poor  Insight:Fair   Executive Functions  Concentration:Fair  Attention  Span:Fair  Recall:Fair  Fund of Knowledge:Fair  Language:Fair   Psychomotor Activity  Psychomotor Activity:No data recorded  Assets  Assets:Resilience; Social Support; Housing; Intimacy; Financial Resources/Insurance   Sleep  Sleep:No data recorded   Physical Exam: Physical Exam Vitals and nursing note reviewed.  Constitutional:      Appearance: Normal appearance.  HENT:     Head: Normocephalic and atraumatic.     Mouth/Throat:     Pharynx: Oropharynx is clear.  Eyes:  Pupils: Pupils are equal, round, and reactive to light.  Cardiovascular:     Rate and Rhythm: Normal rate and regular rhythm.  Pulmonary:     Effort: Pulmonary effort is normal.     Breath sounds: Normal breath sounds.  Abdominal:     General: Abdomen is flat.     Palpations: Abdomen is soft.  Musculoskeletal:        General: Normal range of motion.  Skin:    General: Skin is warm and dry.  Neurological:     General: No focal deficit present.     Mental Status: She is alert. Mental status is at baseline.  Psychiatric:        Attention and Perception: Attention normal.        Mood and Affect: Mood normal.        Speech: Speech normal.        Behavior: Behavior normal.        Thought Content: Thought content normal.        Cognition and Memory: Cognition normal.    Review of Systems  Constitutional: Negative.   HENT: Negative.    Eyes: Negative.   Respiratory: Negative.    Cardiovascular: Negative.   Gastrointestinal: Negative.   Musculoskeletal: Negative.   Skin: Negative.   Neurological: Negative.   Psychiatric/Behavioral: Negative.     Blood pressure 117/77, pulse 78, temperature 98.4 F (36.9 C), temperature source Oral, resp. rate 18, height 5\' 5"  (1.651 m), weight 72.6 kg, SpO2 100 %, unknown if currently breastfeeding. Body mass index is 26.63 kg/m.   Treatment Plan Summary: Medication management and Plan no change to current medication.  I will order a 10-day supply and  start making preparations for likely discharge tomorrow with referral for continued outpatient treatment.  Reviewed medication plan with patient who expresses agreement.  , MD 04/02/2022, 12:04 PM

## 2022-04-03 ENCOUNTER — Other Ambulatory Visit: Payer: Self-pay

## 2022-04-03 NOTE — [facility] Counselor (Addendum)
CSW spoke with pt briefly regarding discharge. She stated that she will be returning home and someone will be providing transportation for her. Pt endorsed plans to continue with outpatient ACTT services through [facility]. She stated that she is a smoker but declined interest in having a referral sent to [facility]. Pt denied any use of substances. When asked about any further needs from CSW, pt denied any. CSW did explain that pt would need to wait for her nurse to notify her of need to call and make arrangements for transportation to come. She agreed. No other concerns expressed. Contact ended without incident.   CSW contacted [facility]. CSW spoke with Herbert Seta and was informed that the team was in a meeting currently. Message was left with contact information for follow up. No other concerns expressed. Contact ended without incident.  Vilma Meckel. Algis Greenhouse, MSW, LCSW, LCAS 04/03/2022 9:35 AM

## 2022-04-03 NOTE — Progress Notes (Signed)
Recreation Therapy Notes  Date: 04/03/2022   Time: 10:20 am    Location: Courtyard   Behavioral response: Appropriate  Intervention Topic:  Leisure   Discussion/Intervention:  Group content today was focused on leisure. The group defined what leisure is and some positive leisure activities they participate in. Individuals identified the difference between good and bad leisure. Participants expressed how they feel after participating in the leisure of their choice. The group discussed how they go about picking a leisure activity and if others are involved in their leisure activities. The patient stated how many leisure activities they have to choose from and reasons why it is important to have leisure time. Individuals participated in the intervention "Exploration of Leisure" where they had a chance to identify new leisure activities as well as benefits of leisure. Clinical Observations/Feedback: Patient came to group and identified leisure activities they enjoy and leisure activities they would like to participate in outside of the hospital. Individual was social with peers and staff while participating in the intervention.     [name] LRT/CTRS        [name] 04/03/2022 11:51 AM

## 2022-04-03 NOTE — [facility] Suicide Risk Assessment (Signed)
[facility] Discharge Suicide Risk Assessment   Principal Problem: Schizoaffective disorder, bipolar type (HCC) Discharge Diagnoses: Principal Problem:   Schizoaffective disorder, bipolar type (HCC)   Total Time spent with patient: 30 minutes  Musculoskeletal: Strength & Muscle Tone: within normal limits Gait & Station: normal Patient leans: N/A  Psychiatric Specialty Exam  Presentation  General Appearance: Appropriate for Environment  Eye Contact:Good  Speech:Blocked (appears improved from earlier)  Speech Volume:Normal  Handedness:No data recorded  Mood and Affect  Mood:Dysphoric  Duration of Depression Symptoms: Greater than two weeks  Affect:Blunt   Thought Process  Thought Processes:Disorganized  Descriptions of Associations:No data recorded Orientation:Full (Time, Place and Person)  Thought Content:Scattered  History of Schizophrenia/Schizoaffective disorder:Yes  Duration of Psychotic Symptoms:Greater than six months  Hallucinations:No data recorded Ideas of Reference:Delusions  Suicidal Thoughts:No data recorded Homicidal Thoughts:No data recorded  Sensorium  Memory:Immediate Poor  Judgment:Poor  Insight:Fair   Executive Functions  Concentration:Fair  Attention Span:Fair  Recall:Fair  Fund of Knowledge:Fair  Language:Fair   Psychomotor Activity  Psychomotor Activity:No data recorded  Assets  Assets:Resilience; Social Support; Housing; Intimacy; Financial Resources/Insurance   Sleep  Sleep:No data recorded  Physical Exam: Physical Exam Vitals and nursing note reviewed.  Constitutional:      Appearance: Normal appearance.  HENT:     Head: Normocephalic and atraumatic.     Mouth/Throat:     Pharynx: Oropharynx is clear.  Eyes:     Pupils: Pupils are equal, round, and reactive to light.  Cardiovascular:     Rate and Rhythm: Normal rate and regular rhythm.  Pulmonary:     Effort: Pulmonary effort is normal.     Breath  sounds: Normal breath sounds.  Abdominal:     General: Abdomen is flat.     Palpations: Abdomen is soft.  Musculoskeletal:        General: Normal range of motion.  Skin:    General: Skin is warm and dry.  Neurological:     General: No focal deficit present.     Mental Status: She is alert. Mental status is at baseline.  Psychiatric:        Attention and Perception: Attention normal.        Mood and Affect: Mood normal.        Speech: Speech normal.        Behavior: Behavior normal.        Thought Content: Thought content normal.        Cognition and Memory: Cognition normal.        Judgment: Judgment normal.    Review of Systems  Constitutional: Negative.   HENT: Negative.    Eyes: Negative.   Respiratory: Negative.    Cardiovascular: Negative.   Gastrointestinal: Negative.   Musculoskeletal: Negative.   Skin: Negative.   Neurological: Negative.   Psychiatric/Behavioral: Negative.     Blood pressure 128/84, pulse 60, temperature 98.6 F (37 C), temperature source Oral, resp. rate 17, height 5\' 5"  (1.651 m), weight 72.6 kg, SpO2 100 %, unknown if currently breastfeeding. Body mass index is 26.63 kg/m.  Mental Status Per Nursing Assessment::   On Admission:  NA  Demographic Factors:  NA  Loss Factors: NA  Historical Factors: NA  Risk Reduction Factors:   Sense of responsibility to family, Living with another person, especially a relative, Positive social support, Positive therapeutic relationship, and Positive coping skills or problem solving skills  Continued Clinical Symptoms:  Bipolar Disorder:   Mixed State  Cognitive Features That Contribute To  Risk:  None    Suicide Risk:  Minimal: No identifiable suicidal ideation.  Patients presenting with no risk factors but with morbid ruminations; may be classified as minimal risk based on the severity of the depressive symptoms    Plan Of Care/Follow-up recommendations:  Patient is upbeat but controlled.   Euthymic affect.  Denies any suicidal thoughts or homicidal thoughts.  Not showing active signs of overt psychosis.  Denies hallucinations.  No indication of current dangerousness to self or others.  Good insight.  Agrees to outpatient treatment and medication.  Prescriptions provided.  Mordecai Rasmussen, MD 04/03/2022, 10:08 AM

## 2022-04-03 NOTE — Progress Notes (Signed)
D:Patient denies SI/HI/AVH, able to contract for safety at this time. Pt appears calm and cooperative, and no distress noted.   A: All Personal items in locker returned to pt. Upon discharge.   R:  Pt States she will comply with discharge planning put into place and take MEDS as prescribed. Pt escorted out of the building by this writer/staff.  

## 2022-04-03 NOTE — Plan of Care (Addendum)
Care Plan Closed Out Per Pending Discharge    Problem: Education: Goal: Knowledge of General Education information will improve Description: Including pain rating scale, medication(s)/side effects and non-pharmacologic comfort measures 04/03/2022 0902 by Alyssa Ivan, RN Outcome: Completed/Met 04/03/2022 0902 by Alyssa Ivan, RN Outcome: Adequate for Discharge 04/03/2022 0852 by Alyssa Ivan, RN Outcome: Adequate for Discharge   Problem: Health Behavior/Discharge Planning: Goal: Ability to manage health-related needs will improve 04/03/2022 0902 by Alyssa Ivan, RN Outcome: Completed/Met 04/03/2022 0902 by Alyssa Ivan, RN Outcome: Adequate for Discharge 04/03/2022 0852 by Alyssa Ivan, RN Outcome: Adequate for Discharge   Problem: Clinical Measurements: Goal: Ability to maintain clinical measurements within normal limits will improve 04/03/2022 0902 by Alyssa Ivan, RN Outcome: Completed/Met 04/03/2022 0902 by Alyssa Ivan, RN Outcome: Adequate for Discharge 04/03/2022 0852 by Alyssa Ivan, RN Outcome: Adequate for Discharge Goal: Will remain free from infection 04/03/2022 0902 by Alyssa Ivan, RN Outcome: Completed/Met 04/03/2022 0902 by Alyssa Ivan, RN Outcome: Adequate for Discharge 04/03/2022 0852 by Alyssa Ivan, RN Outcome: Adequate for Discharge Goal: Diagnostic test results will improve 04/03/2022 0902 by Alyssa Ivan, RN Outcome: Completed/Met 04/03/2022 0902 by Alyssa Ivan, RN Outcome: Adequate for Discharge 04/03/2022 0852 by Alyssa Ivan, RN Outcome: Adequate for Discharge Goal: Respiratory complications will improve 04/03/2022 0902 by Alyssa Ivan, RN Outcome: Completed/Met 04/03/2022 0902 by Alyssa Ivan, RN Outcome: Adequate for Discharge 04/03/2022 0852 by Alyssa Ivan, RN Outcome: Adequate for Discharge Goal: Cardiovascular complication will be avoided 04/03/2022 0902 by Alyssa Ivan,  RN Outcome: Completed/Met 04/03/2022 0902 by Alyssa Ivan, RN Outcome: Adequate for Discharge 04/03/2022 0852 by Alyssa Ivan, RN Outcome: Adequate for Discharge   Problem: Activity: Goal: Risk for activity intolerance will decrease 04/03/2022 0902 by Alyssa Ivan, RN Outcome: Completed/Met 04/03/2022 0902 by Alyssa Ivan, RN Outcome: Adequate for Discharge 04/03/2022 [phone] by Alyssa Ivan, RN Outcome: Adequate for Discharge   Problem: Nutrition: Goal: Adequate nutrition will be maintained 04/03/2022 0902 by Alyssa Ivan, RN Outcome: Completed/Met 04/03/2022 0902 by Alyssa Ivan, RN Outcome: Adequate for Discharge 04/03/2022 0852 by Alyssa Ivan, RN Outcome: Adequate for Discharge   Problem: Coping: Goal: Level of anxiety will decrease 04/03/2022 0902 by Alyssa Ivan, RN Outcome: Completed/Met 04/03/2022 0902 by Alyssa Ivan, RN Outcome: Adequate for Discharge 04/03/2022 [phone] by Alyssa Ivan, RN Outcome: Adequate for Discharge   Problem: Elimination: Goal: Will not experience complications related to bowel motility 04/03/2022 0902 by Alyssa Ivan, RN Outcome: Completed/Met 04/03/2022 0902 by Alyssa Ivan, RN Outcome: Adequate for Discharge 04/03/2022 [phone] by Alyssa Ivan, RN Outcome: Adequate for Discharge Goal: Will not experience complications related to urinary retention 04/03/2022 0902 by Alyssa Ivan, RN Outcome: Completed/Met 04/03/2022 0902 by Alyssa Ivan, RN Outcome: Adequate for Discharge 04/03/2022 0852 by Alyssa Ivan, RN Outcome: Adequate for Discharge   Problem: Pain Managment: Goal: General experience of comfort will improve 04/03/2022 0902 by Alyssa Ivan, RN Outcome: Completed/Met 04/03/2022 0902 by Alyssa Ivan, RN Outcome: Adequate for Discharge 04/03/2022 [phone] by Alyssa Ivan, RN Outcome: Adequate for Discharge   Problem: Safety: Goal: Ability to remain free from injury will  improve 04/03/2022 0902 by Alyssa Ivan, RN Outcome: Completed/Met 04/03/2022 0902 by Alyssa Ivan, RN Outcome: Adequate for Discharge 04/03/2022 0852 by Alyssa Ivan, RN Outcome: Adequate for Discharge  Problem: Skin Integrity: Goal: Risk for impaired skin integrity will decrease 04/03/2022 0902 by Alyssa Ivan, RN Outcome: Completed/Met 04/03/2022 0902 by Alyssa Ivan, RN Outcome: Adequate for Discharge 04/03/2022 0852 by Alyssa Ivan, RN Outcome: Adequate for Discharge   Problem: Activity: Goal: Will verbalize the importance of balancing activity with adequate rest periods 04/03/2022 0902 by Alyssa Ivan, RN Outcome: Completed/Met 04/03/2022 0902 by Alyssa Ivan, RN Outcome: Adequate for Discharge 04/03/2022 [phone] by Alyssa Ivan, RN Outcome: Adequate for Discharge   Problem: Education: Goal: Will be free of psychotic symptoms 04/03/2022 0902 by Alyssa Ivan, RN Outcome: Completed/Met 04/03/2022 0902 by Alyssa Ivan, RN Outcome: Adequate for Discharge 04/03/2022 [phone] by Alyssa Ivan, RN Outcome: Adequate for Discharge Goal: Knowledge of the prescribed therapeutic regimen will improve 04/03/2022 0902 by Alyssa Ivan, RN Outcome: Completed/Met 04/03/2022 0902 by Alyssa Ivan, RN Outcome: Adequate for Discharge 04/03/2022 0852 by Alyssa Ivan, RN Outcome: Adequate for Discharge   Problem: Coping: Goal: Coping ability will improve 04/03/2022 0902 by Alyssa Ivan, RN Outcome: Completed/Met 04/03/2022 0902 by Alyssa Ivan, RN Outcome: Adequate for Discharge 04/03/2022 [phone] by Alyssa Ivan, RN Outcome: Adequate for Discharge Goal: Will verbalize feelings 04/03/2022 0902 by Alyssa Ivan, RN Outcome: Completed/Met 04/03/2022 0902 by Alyssa Ivan, RN Outcome: Adequate for Discharge 04/03/2022 [phone] by Alyssa Ivan, RN Outcome: Adequate for Discharge   Problem: Health Behavior/Discharge Planning: Goal:  Compliance with prescribed medication regimen will improve 04/03/2022 0902 by Alyssa Ivan, RN Outcome: Completed/Met 04/03/2022 0902 by Alyssa Ivan, RN Outcome: Adequate for Discharge 04/03/2022 [phone] by Alyssa Ivan, RN Outcome: Adequate for Discharge   Problem: Nutritional: Goal: Ability to achieve adequate nutritional intake will improve 04/03/2022 0902 by Alyssa Ivan, RN Outcome: Completed/Met 04/03/2022 0902 by Alyssa Ivan, RN Outcome: Adequate for Discharge 04/03/2022 [phone] by Alyssa Ivan, RN Outcome: Adequate for Discharge   Problem: Role Relationship: Goal: Ability to communicate needs accurately will improve 04/03/2022 0902 by Alyssa Ivan, RN Outcome: Completed/Met 04/03/2022 0902 by Alyssa Ivan, RN Outcome: Adequate for Discharge 04/03/2022 [phone] by Alyssa Ivan, RN Outcome: Adequate for Discharge Goal: Ability to interact with others will improve 04/03/2022 0902 by Alyssa Ivan, RN Outcome: Completed/Met 04/03/2022 0902 by Alyssa Ivan, RN Outcome: Adequate for Discharge 04/03/2022 [phone] by Alyssa Ivan, RN Outcome: Adequate for Discharge   Problem: Safety: Goal: Ability to redirect hostility and anger into socially appropriate behaviors will improve 04/03/2022 0902 by Alyssa Ivan, RN Outcome: Completed/Met 04/03/2022 0902 by Alyssa Ivan, RN Outcome: Adequate for Discharge 04/03/2022 [phone] by Alyssa Ivan, RN Outcome: Adequate for Discharge Goal: Ability to remain free from injury will improve 04/03/2022 0902 by Alyssa Ivan, RN Outcome: Completed/Met 04/03/2022 0902 by Alyssa Ivan, RN Outcome: Adequate for Discharge 04/03/2022 [phone] by Alyssa Ivan, RN Outcome: Adequate for Discharge   Problem: Self-Care: Goal: Ability to participate in self-care as condition permits will improve 04/03/2022 0902 by Alyssa Ivan, RN Outcome: Completed/Met 04/03/2022 0902 by Alyssa Ivan, RN Outcome:  Adequate for Discharge 04/03/2022 0852 by Alyssa Ivan, RN Outcome: Adequate for Discharge   Problem: Self-Concept: Goal: Will verbalize positive feelings about self 04/03/2022 0902 by Alyssa Ivan, RN Outcome: Completed/Met 04/03/2022 0902 by Alyssa Ivan, RN Outcome: Adequate for Discharge 04/03/2022 0852 by Alyssa Ivan, RN Outcome: Adequate for Discharge   Problem: Education:  Goal: Knowledge of [facility] Education information/materials will improve 04/03/2022 0902 by Alyssa Ivan, RN Outcome: Completed/Met 04/03/2022 0902 by Alyssa Ivan, RN Outcome: Adequate for Discharge 04/03/2022 [phone] by Alyssa Ivan, RN Outcome: Adequate for Discharge Goal: Emotional status will improve 04/03/2022 0902 by Alyssa Ivan, RN Outcome: Completed/Met 04/03/2022 0902 by Alyssa Ivan, RN Outcome: Adequate for Discharge 04/03/2022 [phone] by Alyssa Ivan, RN Outcome: Adequate for Discharge Goal: Mental status will improve 04/03/2022 0902 by Alyssa Ivan, RN Outcome: Completed/Met 04/03/2022 0902 by Alyssa Ivan, RN Outcome: Adequate for Discharge 04/03/2022 [phone] by Alyssa Ivan, RN Outcome: Adequate for Discharge Goal: Verbalization of understanding the information provided will improve 04/03/2022 0902 by Alyssa Ivan, RN Outcome: Completed/Met 04/03/2022 0902 by Alyssa Ivan, RN Outcome: Adequate for Discharge 04/03/2022 0852 by Alyssa Ivan, RN Outcome: Adequate for Discharge   Problem: Activity: Goal: Interest or engagement in activities will improve 04/03/2022 0902 by Alyssa Ivan, RN Outcome: Completed/Met 04/03/2022 0902 by Alyssa Ivan, RN Outcome: Adequate for Discharge 04/03/2022 [phone] by Alyssa Ivan, RN Outcome: Adequate for Discharge Goal: Sleeping patterns will improve 04/03/2022 0902 by Alyssa Ivan, RN Outcome: Completed/Met 04/03/2022 0902 by Alyssa Ivan, RN Outcome: Adequate for  Discharge 04/03/2022 [phone] by Alyssa Ivan, RN Outcome: Adequate for Discharge   Problem: Coping: Goal: Ability to verbalize frustrations and anger appropriately will improve 04/03/2022 0902 by Alyssa Ivan, RN Outcome: Completed/Met 04/03/2022 0902 by Alyssa Ivan, RN Outcome: Adequate for Discharge 04/03/2022 [phone] by Alyssa Ivan, RN Outcome: Adequate for Discharge Goal: Ability to demonstrate self-control will improve 04/03/2022 0902 by Alyssa Ivan, RN Outcome: Completed/Met 04/03/2022 0902 by Alyssa Ivan, RN Outcome: Adequate for Discharge 04/03/2022 [phone] by Alyssa Ivan, RN Outcome: Adequate for Discharge   Problem: Health Behavior/Discharge Planning: Goal: Identification of resources available to assist in meeting health care needs will improve 04/03/2022 0902 by Alyssa Ivan, RN Outcome: Completed/Met 04/03/2022 0902 by Alyssa Ivan, RN Outcome: Adequate for Discharge 04/03/2022 [phone] by Alyssa Ivan, RN Outcome: Adequate for Discharge Goal: Compliance with treatment plan for underlying cause of condition will improve 04/03/2022 0902 by Alyssa Ivan, RN Outcome: Completed/Met 04/03/2022 0902 by Alyssa Ivan, RN Outcome: Adequate for Discharge 04/03/2022 [phone] by Alyssa Ivan, RN Outcome: Adequate for Discharge   Problem: Physical Regulation: Goal: Ability to maintain clinical measurements within normal limits will improve 04/03/2022 0902 by Alyssa Ivan, RN Outcome: Completed/Met 04/03/2022 0902 by Alyssa Ivan, RN Outcome: Adequate for Discharge 04/03/2022 0852 by Alyssa Ivan, RN Outcome: Adequate for Discharge   Problem: Safety: Goal: Periods of time without injury will increase 04/03/2022 0902 by Alyssa Ivan, RN Outcome: Completed/Met 04/03/2022 0902 by Alyssa Ivan, RN Outcome: Adequate for Discharge 04/03/2022 0852 by Alyssa Ivan, RN Outcome: Adequate for Discharge

## 2022-04-03 NOTE — Progress Notes (Signed)
  [facility] Adult Case Management Discharge Plan :  Will you be returning to the same living situation after discharge:  Yes,  pt plans to return home. At discharge, do you have transportation home?: Yes,  pt partner to pick up. Do you have the ability to pay for your medications: Yes,  [employer].  Release of information consent forms completed and in the chart;  Patient's signature needed at discharge.  Patient to Follow up at:  Follow-up Information     [facility] Follow up.   Why: Your ACTT team will follow up with you tomorrow 04/04/2022 and for 7 days straight afterwards. Contact information: [address] [place] [address] [phone]                 Next level of care provider has access to [facility] Link:no  Safety Planning and Suicide Prevention discussed: Yes,  SPE completed with partner, [name].     Has patient been referred to the Quitline?: Patient refused referral  Patient has been referred for addiction treatment: N/A  [name], LCSW 04/03/2022, 11:16 AM

## 2022-04-03 NOTE — BH IP Treatment Plan (Signed)
Interdisciplinary Treatment and Diagnostic Plan Update  04/03/2022 Time of Session: 8:30 AM Alyssa Mathis MRN: [id number]  Principal Diagnosis: Schizoaffective disorder, bipolar type (HCC)  Secondary Diagnoses: Principal Problem:   Schizoaffective disorder, bipolar type (HCC)   Current Medications:  Current Facility-Administered Medications  Medication Dose Route Frequency Provider Last Rate Last Admin   acetaminophen (TYLENOL) tablet 650 mg  650 mg Oral Q6H PRN [name], NP   650 mg at 03/16/22 0840   alum & mag hydroxide-simeth (MAALOX/MYLANTA) 200-200-20 MG/5ML suspension 30 mL  30 mL Oral Q4H PRN [name], NP       benztropine (COGENTIN) tablet 0.5 mg  0.5 mg Oral BID [name], NP   0.5 mg at 04/03/22 0747   docusate sodium (COLACE) capsule 100 mg  100 mg Oral BID [name], [name], MD   100 mg at 04/03/22 0747   haloperidol (HALDOL) tablet 5 mg  5 mg Oral BID [name], NP   5 mg at 04/03/22 0747   hydrOXYzine (ATARAX) tablet 25 mg  25 mg Oral TID PRN [name], NP   25 mg at 03/27/22 0858   lithium carbonate (ESKALITH) CR tablet 450 mg  450 mg Oral Q12H [name], [name], MD   450 mg at 04/03/22 0747   LORazepam (ATIVAN) tablet 2 mg  2 mg Oral Q6H PRN [name], [name], MD   2 mg at 03/24/22 1304   Or   LORazepam (ATIVAN) injection 2 mg  2 mg Intramuscular Q6H PRN [name], [name], MD       magnesium hydroxide (MILK OF MAGNESIA) suspension 30 mL  30 mL Oral Daily PRN [name], NP   30 mL at 03/15/22 0811   metoprolol succinate (TOPROL-XL) 24 hr tablet 25 mg  25 mg Oral Daily [name], NP   25 mg at 04/03/22 0747   nicotine polacrilex (NICORETTE) gum 2 mg  2 mg Oral PRN Sarina Ill, DO   2 mg at 04/02/22 1759   NIFEdipine (PROCARDIA-XL/NIFEDICAL-XL) 24 hr tablet 90 mg  90 mg Oral Daily [name], [name], MD   90 mg at 04/03/22 0747   OLANZapine (ZYPREXA) tablet 5 mg  5 mg Oral Q6H PRN [name], [name], MD       OLANZapine  zydis (ZYPREXA) disintegrating tablet 15 mg  15 mg Oral QHS [name], [name], MD   15 mg at 04/02/22 2056   senna-docusate (Senokot-S) tablet 1 tablet  1 tablet Oral Daily PRN [name], [name], MD       PTA Medications: Medications Prior to Admission  Medication Sig Dispense Refill Last Dose   acetaminophen (TYLENOL) 325 MG tablet Take 2 tablets (650 mg total) by mouth every 4 (four) hours as needed (for pain scale < 4).   prn   NIFEdipine (PROCARDIA XL/NIFEDICAL XL) 60 MG 24 hr tablet Take 1 tablet (60 mg total) by mouth daily. (Patient not taking: Reported on 03/13/2022) 30 tablet 5 Not Taking   OLANZapine (ZYPREXA) 20 MG tablet Take 20 mg by mouth at bedtime.      OLANZapine (ZYPREXA) 5 MG tablet Take 1 tablet (5 mg total) by mouth at bedtime. 30 tablet 5     Patient Stressors: Medication change or noncompliance    Patient Strengths: Motivation for treatment/growth   Treatment Modalities: Medication Management, Group therapy, Case management,  1 to 1 session with clinician, Psychoeducation, Recreational therapy.   Physician Treatment Plan for Primary Diagnosis:  Schizoaffective disorder, bipolar type (HCC) Long Term Goal(s): Improvement in symptoms so as ready for discharge   Short Term Goals: Compliance with prescribed medications will improve Ability to verbalize feelings will improve Ability to demonstrate self-control will improve  Medication Management: Evaluate patient's response, side effects, and tolerance of medication regimen.  Therapeutic Interventions: 1 to 1 sessions, Unit Group sessions and Medication administration.  Evaluation of Outcomes: Adequate for Discharge  Physician Treatment Plan for Secondary Diagnosis: Principal Problem:   Schizoaffective disorder, bipolar type (HCC)  Long Term Goal(s): Improvement in symptoms so as ready for discharge   Short Term Goals: Compliance with prescribed medications will improve Ability to verbalize feelings will  improve Ability to demonstrate self-control will improve     Medication Management: Evaluate patient's response, side effects, and tolerance of medication regimen.  Therapeutic Interventions: 1 to 1 sessions, Unit Group sessions and Medication administration.  Evaluation of Outcomes: Adequate for Discharge   RN Treatment Plan for Primary Diagnosis: Schizoaffective disorder, bipolar type (HCC) Long Term Goal(s): Knowledge of disease and therapeutic regimen to maintain health will improve  Short Term Goals: Ability to remain free from injury will improve, Ability to verbalize frustration and anger appropriately will improve, Ability to demonstrate self-control, Ability to participate in decision making will improve, Ability to verbalize feelings will improve, Ability to disclose and discuss suicidal ideas, Ability to identify and develop effective coping behaviors will improve, and Compliance with prescribed medications will improve  Medication Management: RN will administer medications as ordered by provider, will assess and evaluate patient's response and provide education to patient for prescribed medication. RN will report any adverse and/or side effects to prescribing provider.  Therapeutic Interventions: 1 on 1 counseling sessions, Psychoeducation, Medication administration, Evaluate responses to treatment, Monitor vital signs and CBGs as ordered, Perform/monitor CIWA, COWS, AIMS and Fall Risk screenings as ordered, Perform wound care treatments as ordered.  Evaluation of Outcomes: Adequate for Discharge   LCSW Treatment Plan for Primary Diagnosis: Schizoaffective disorder, bipolar type (HCC) Long Term Goal(s): Safe transition to appropriate next level of care at discharge, Engage patient in therapeutic group addressing interpersonal concerns.  Short Term Goals: Engage patient in aftercare planning with referrals and resources, Increase social support, Increase ability to appropriately  verbalize feelings, Increase emotional regulation, Facilitate acceptance of mental health diagnosis and concerns, and Increase skills for wellness and recovery  Therapeutic Interventions: Assess for all discharge needs, 1 to 1 time with Social worker, Explore available resources and support systems, Assess for adequacy in community support network, Educate family and significant other(s) on suicide prevention, Complete Psychosocial Assessment, Interpersonal group therapy.  Evaluation of Outcomes: Adequate for Discharge   Progress in Treatment: Attending groups: Yes. and No. Participating in groups: Yes. Taking medication as prescribed: Yes. Toleration medication: Yes. Family/Significant other contact made: Yes, individual(s) contacted:  partner, [name]. Patient understands diagnosis: Yes. Discussing patient identified problems/goals with staff: Yes. Medical problems stabilized or resolved: Yes. Denies suicidal/homicidal ideation: Yes. Issues/concerns per patient self-inventory: No. Other: none.  New problem(s) identified: No, Describe:  none  Update 03/29/2022: No changes at this time. Update 04/03/22: No changes at this time.    New Short Term/Long Term Goal(s): Patient to work towards detox, elimination of symptoms of psychosis, medication management for mood stabilization; elimination of SI thoughts; development of comprehensive mental wellness/sobriety plan.  Update 03/29/2022: No changes at this time. Update 04/03/22: No changes at this time.    Patient Goals:  No additional goals identified at this time.  Patient to continue to work towards original goals identified in initial treatment team meeting. CSW will remain available to patient should they voice additional treatment goals. Update 03/29/2022: Patient continues to improve in behaviors and presentation but continues to be somewhat delayed in her mannerisms. Update 04/03/22: No changes at this time.   Discharge Plan or Barriers:  No psychosocial barriers identified at this time, patient to return to place of residence when appropriate for discharge. Update 03/29/2022: No changes at this time. Update 04/03/22: Pt to discharge with aftercare through current outpatient provider ([facility]) and return home. She has personal transportation coming to take her home.   Reason for Continuation of Hospitalization: Other; describe psychosis    Estimated Length of Stay: 1-7 days  Update 03/29/2022: No changes at this time. Update 04/03/22: Set for discharge today.  Last 3 [place] Suicide Severity Risk Score: Flowsheet Row Admission (Current) from 03/13/2022 in [facility]PAT[facility] MEDICINE ED from 03/11/2022 in [facility] EMERGENCY DEPARTMENT ED from 07/20/2021 in [facility] EMERGENCY DEPARTMENT  C-SSRS RISK CATEGORY No Risk No Risk No Risk       Last PHQ 2/9 Scores:     No data to display          Scribe for Treatment Team: [name], [place] 04/03/2022 8:46 AM

## 2022-04-03 NOTE — Plan of Care (Signed)
Patient during assessments this morning observed up in her room attending to hygiene, just prior to our engagement. Patient during our engagement this morning endorsed doing "good" in her mood, with a restricted affect, watchful eye contact, and an odd and atypical interpersonal style. Patient endorsed she slept good over the night and has been tolerating her medications. Patient endorsed no problems with eating lately. Patient denied physical pain. Patient orientation appeared grossly intact. Patient overall engagement and thought process was superficial, short, and vague, but appropriate to engagement. Patient and this Clinical research associate reviewed discharge process.    Patient has been complaint with medications and unit procedures thus far. Pt. Has been observed eating good thus far. Pt. Has been able to remain safe on the unit thus far. Patient today outside of breakfast has been observed resting in the dayroom and or her room.   Q x 15 minute observation checks in place/maintained for safety. Patient is provided with education throughout shift when appropriate and able.  Patient is given/offered medications per orders. Patient is encouraged to attend groups, participate in unit activities and continue with plan of care, until pending discharge is complete. Pt. Chart and plans of care reviewed. Pt. Given support and encouragement when appropriate and able. Will continue to process pending discharge at this time.      Problem: Education: Goal: Knowledge of General Education information will improve Description: Including pain rating scale, medication(s)/side effects and non-pharmacologic comfort measures Outcome: Adequate for Discharge Problem: Health Behavior/Discharge Planning: Goal: Ability to manage health-related needs will improve Outcome: Adequate for Discharge Problem: Clinical Measurements: Goal: Ability to maintain clinical measurements within normal limits will improve Outcome: Adequate for  Discharge Goal: Will remain free from infection Outcome: Adequate for Discharge Goal: Diagnostic test results will improve Outcome: Adequate for Discharge Goal: Respiratory complications will improve Outcome: Adequate for Discharge Goal: Cardiovascular complication will be avoided Outcome: Adequate for Discharge Problem: Activity: Goal: Risk for activity intolerance will decrease Outcome: Adequate for Discharge Problem: Nutrition: Goal: Adequate nutrition will be maintained Outcome: Adequate for Discharge Problem: Coping: Goal: Level of anxiety will decrease Outcome: Adequate for Discharge Problem: Elimination: Goal: Will not experience complications related to bowel motility Outcome: Adequate for Discharge Goal: Will not experience complications related to urinary retention Outcome: Adequate for Discharge Problem: Pain Managment: Goal: General experience of comfort will improve Outcome: Adequate for Discharge Problem: Safety: Goal: Ability to remain free from injury will improve Outcome: Adequate for Discharge Problem: Skin Integrity: Goal: Risk for impaired skin integrity will decrease Outcome: Adequate for Discharge Problem: Activity: Goal: Will verbalize the importance of balancing activity with adequate rest periods Outcome: Adequate for Discharge Problem: Education: Goal: Will be free of psychotic symptoms Outcome: Adequate for Discharge Goal: Knowledge of the prescribed therapeutic regimen will improve Outcome: Adequate for Discharge Problem: Coping: Goal: Coping ability will improve Outcome: Adequate for Discharge Goal: Will verbalize feelings Outcome: Adequate for Discharge Problem: Health Behavior/Discharge Planning: Goal: Compliance with prescribed medication regimen will improve Outcome: Adequate for Discharge Problem: Nutritional: Goal: Ability to achieve adequate nutritional intake will improve Outcome: Adequate for Discharge Problem: Role  Relationship: Goal: Ability to communicate needs accurately will improve Outcome: Adequate for Discharge Goal: Ability to interact with others will improve Outcome: Adequate for Discharge Problem: Safety: Goal: Ability to redirect hostility and anger into socially appropriate behaviors will improve Outcome: Adequate for Discharge Goal: Ability to remain free from injury will improve Outcome: Adequate for Discharge Problem: Self-Care: Goal: Ability to participate in self-care as condition permits  will improve Outcome: Adequate for Discharge Problem: Self-Concept: Goal: Will verbalize positive feelings about self Outcome: Adequate for Discharge Problem: Education: Goal: Knowledge of [place] General Education information/materials will improve Outcome: Adequate for Discharge Goal: Emotional status will improve Outcome: Adequate for Discharge Goal: Mental status will improve Outcome: Adequate for Discharge Goal: Verbalization of understanding the information provided will improve Outcome: Adequate for Discharge Problem: Activity: Goal: Interest or engagement in activities will improve Outcome: Adequate for Discharge Goal: Sleeping patterns will improve Outcome: Adequate for Discharge Problem: Coping: Goal: Ability to verbalize frustrations and anger appropriately will improve Outcome: Adequate for Discharge Goal: Ability to demonstrate self-control will improve Outcome: Adequate for Discharge Problem: Health Behavior/Discharge Planning: Goal: Identification of resources available to assist in meeting health care needs will improve Outcome: Adequate for Discharge Goal: Compliance with treatment plan for underlying cause of condition will improve Outcome: Adequate for Discharge Problem: Physical Regulation: Goal: Ability to maintain clinical measurements within normal limits will improve Outcome: Adequate for Discharge Problem: Safety: Goal: Periods of time without injury  will increase Outcome: Adequate for Discharge

## 2022-04-03 NOTE — Discharge Summary (Signed)
Physician Discharge Summary Note  Patient:  Alyssa Mathis is an 36 y.o., female MRN:  [id number] DOB:  08-Nov-1985 Patient phone:  [phone] (home)  Patient address:   [address] [place] [address],  Total Time spent with patient: 30 minutes  Date of Admission:  03/13/2022 Date of Discharge: 04/03/2022  Reason for Admission: Patient was admitted with a return of psychotic symptoms with agitation and disorganized behavior hyperreligiosity poor self-care.  Principal Problem: Schizoaffective disorder, bipolar type [facility]) Discharge Diagnoses: Principal Problem:   Schizoaffective disorder, bipolar type (HCC)   Past Psychiatric History: Past history of bipolar versus schizoaffective disorder with recurrent episodes of psychosis.  Does well on medication.  Past Medical History:  Past Medical History:  Diagnosis Date   Anemia    Hypertension    Post partum depression    Pregnancy induced hypertension    Schizoaffective disorder (HCC)     Past Surgical History:  Procedure Laterality Date   NO PAST SURGERIES     Family History:  Family History  Problem Relation Age of Onset   Hypertension Sister    Hypertension Maternal Grandmother    Diabetes Maternal Grandfather    Hypertension Maternal Grandfather    Family Psychiatric  History: See previous Social History:  Social History   Substance and Sexual Activity  Alcohol Use Never     Social History   Substance and Sexual Activity  Drug Use Not Currently    Social History   Socioeconomic History   Marital status: Single    Spouse name: Not on file   Number of children: Not on file   Years of education: Not on file   Highest education level: Not on file  Occupational History   Not on file  Tobacco Use   Smoking status: Every Day    Types: Cigarettes   Smokeless tobacco: Never  Vaping Use   Vaping Use: Not on file  Substance and Sexual Activity   Alcohol use: Never   Drug use: Not Currently   Sexual  activity: Yes  Other Topics Concern   Not on file  Social History Narrative   Not on file   Social Determinants of Health   Financial Resource Strain: Not on file  Food Insecurity: Not on file  Transportation Needs: Not on file  Physical Activity: Not on file  Stress: Not on file  Social Connections: Not on file    Hospital Course: Admitted to psychiatric unit.  15-minute checks continued.  Initially very agitated lots of loud behavior somewhat disruptive but not violent behavior on the unit.  Medications were restarted and were adjusted with only partial response.  During her 3-week hospitalization she appeared to cycle from a more manic-like presentation into a more depressed presentation with an extended period of withdrawal and fear but still continued hallucinations.  Ultimately in addition to antipsychotics she was started on lithium.  Lithium has now been titrated to 450 mg twice a day which appears to be well tolerated without any sign of lithium toxicity.  Over the last several days there has been very significant improvement.  She now denies hallucinations and has not shown any dangerous behavior.  ADLs are improved and she shows good insight and is agreeable to a plan to continue outpatient medicine.  Prescriptions provided.  Follow-up with ACT team.  Physical Findings: AIMS:  , ,  ,  ,    CIWA:    COWS:     Musculoskeletal: Strength & Muscle Tone: within normal  limits Gait & Station: normal Patient leans: N/A   Psychiatric Specialty Exam:  Presentation  General Appearance: Appropriate for Environment  Eye Contact:Good  Speech:Blocked (appears improved from earlier)  Speech Volume:Normal  Handedness:No data recorded  Mood and Affect  Mood:Dysphoric  Affect:Blunt   Thought Process  Thought Processes:Disorganized  Descriptions of Associations:No data recorded Orientation:Full (Time, Place and Person)  Thought Content:Scattered  History of  Schizophrenia/Schizoaffective disorder:Yes  Duration of Psychotic Symptoms:Greater than six months  Hallucinations:No data recorded Ideas of Reference:Delusions  Suicidal Thoughts:No data recorded Homicidal Thoughts:No data recorded  Sensorium  Memory:Immediate Poor  Judgment:Poor  Insight:Fair   Executive Functions  Concentration:Fair  Attention Span:Fair  Recall:Fair  Fund of Knowledge:Fair  Language:Fair   Psychomotor Activity  Psychomotor Activity:No data recorded  Assets  Assets:Resilience; Social Support; Housing; Intimacy; Financial Resources/Insurance   Sleep  Sleep:No data recorded   Physical Exam: Physical Exam Vitals and nursing note reviewed.  Constitutional:      Appearance: Normal appearance.  HENT:     Head: Normocephalic and atraumatic.     Mouth/Throat:     Pharynx: Oropharynx is clear.  Eyes:     Pupils: Pupils are equal, round, and reactive to light.  Cardiovascular:     Rate and Rhythm: Normal rate and regular rhythm.  Pulmonary:     Effort: Pulmonary effort is normal.     Breath sounds: Normal breath sounds.  Abdominal:     General: Abdomen is flat.     Palpations: Abdomen is soft.  Musculoskeletal:        General: Normal range of motion.  Skin:    General: Skin is warm and dry.  Neurological:     General: No focal deficit present.     Mental Status: She is alert. Mental status is at baseline.  Psychiatric:        Attention and Perception: Attention normal.        Mood and Affect: Mood normal.        Speech: Speech normal.        Behavior: Behavior normal.        Thought Content: Thought content normal.        Cognition and Memory: Cognition normal.        Judgment: Judgment normal.    Review of Systems  Constitutional: Negative.   HENT: Negative.    Eyes: Negative.   Respiratory: Negative.    Cardiovascular: Negative.   Gastrointestinal: Negative.   Musculoskeletal: Negative.   Skin: Negative.   Neurological:  Negative.   Psychiatric/Behavioral: Negative.     Blood pressure 128/84, pulse 60, temperature 98.6 F (37 C), temperature source Oral, resp. rate 17, height 5\' 5"  (1.651 m), weight 72.6 kg, SpO2 100 %, unknown if currently breastfeeding. Body mass index is 26.63 kg/m.   Social History   Tobacco Use  Smoking Status Every Day   Types: Cigarettes  Smokeless Tobacco Never   Tobacco Cessation:  A prescription for an FDA-approved tobacco cessation medication provided at discharge   Blood Alcohol level:  Lab Results  Component Value Date   ETH <10 03/12/2022   ETH <10 01/13/2021    Metabolic Disorder Labs:  Lab Results  Component Value Date   HGBA1C 5.0 03/14/2022   MPG 97 03/14/2022   MPG 91 06/27/2020   No results found for: "PROLACTIN" Lab Results  Component Value Date   CHOL 143 03/14/2022   TRIG 40 03/14/2022   HDL 61 03/14/2022   CHOLHDL 2.3 03/14/2022  VLDL 8 03/14/2022   LDLCALC 74 03/14/2022   LDLCALC 82 06/27/2020    See Psychiatric Specialty Exam and Suicide Risk Assessment completed by Attending Physician prior to discharge.  Discharge destination:  Home  Is patient on multiple antipsychotic therapies at discharge:  Yes,   Do you recommend tapering to monotherapy for antipsychotics?  No   Has Patient had three or more failed trials of antipsychotic monotherapy by history:  Yes,   Antipsychotic medications that previously failed include:   1.  Haloperidol., 2.  Olanzapine., and 3.  Risperdal.  Recommended Plan for Multiple Antipsychotic Therapies: NA  Discharge Instructions     Diet - low sodium heart healthy   Complete by: As directed    Increase activity slowly   Complete by: As directed       Allergies as of 04/03/2022       Reactions   Paliperidone Other (See Comments)   Dystonia   Latex Rash        Medication List     STOP taking these medications    acetaminophen 325 MG tablet Commonly known as: Tylenol       TAKE these  medications      Indication  benztropine 0.5 MG tablet Commonly known as: COGENTIN Take 1 tablet (0.5 mg total) by mouth 2 (two) times daily.  Indication: Extrapyramidal Reaction caused by Medications   docusate sodium 100 MG capsule Commonly known as: COLACE Take 1 capsule (100 mg total) by mouth 2 (two) times daily.  Indication: Constipation   haloperidol 5 MG tablet Commonly known as: HALDOL Take 1 tablet (5 mg total) by mouth 2 (two) times daily.  Indication: Psychosis   hydrOXYzine 25 MG tablet Commonly known as: ATARAX Take 1 tablet (25 mg total) by mouth 3 (three) times daily as needed for anxiety.  Indication: Feeling Anxious   lithium carbonate 450 MG CR tablet Commonly known as: ESKALITH Take 1 tablet (450 mg total) by mouth every 12 (twelve) hours.  Indication: Manic-Depression, Schizoaffective Disorder   metoprolol succinate 25 MG 24 hr tablet Commonly known as: TOPROL-XL Take 1 tablet (25 mg total) by mouth daily.  Indication: High Blood Pressure Disorder   nicotine polacrilex 2 MG gum Commonly known as: NICORETTE Take 1 each (2 mg total) by mouth as needed for smoking cessation.  Indication: Nicotine Addiction   NIFEdipine 90 MG 24 hr tablet Commonly known as: PROCARDIA XL/NIFEDICAL-XL Take 1 tablet (90 mg total) by mouth daily. What changed:  medication strength how much to take  Indication: High Blood Pressure Disorder   OLANZapine 15 MG tablet Commonly known as: ZYPREXA Take 1 tablet (15 mg total) by mouth at bedtime. What changed:  medication strength how much to take Another medication with the same name was removed. Continue taking this medication, and follow the directions you see here.  Indication: Major Depressive Disorder, Manic Phase of Manic-Depression         Follow-up recommendations: Prescriptions and 7-day supply provided.  Referred to continue follow-up with ACT team.  Supportive counseling and review of medication.  Strongly  endorsed continuing medication sleeping well and keeping stress under control.  Comments: Patient agrees to plan  Signed: Mordecai Rasmussen, MD 04/03/2022, 10:11 AM

## 2022-04-04 NOTE — [facility] Counselor (Signed)
CSW received call from [employer] regarding discharge. Staff member requested a copy of discharge paperwork and gave fax number [phone]). CSW agreed. No other concerns expressed. Contact ended without incident.   CSW faxed requested paperwork.   Vilma Meckel. Algis Greenhouse, MSW, LCSW, LCAS 04/04/2022 1:39 PM

## 2022-04-09 ENCOUNTER — Ambulatory Visit: Payer: Self-pay

## 2022-04-09 NOTE — Telephone Encounter (Signed)
Chief Complaint: Spotting? Regular period? Possibly pregnant. Symptoms: PT states she is unsure when she last had a period. It may have been June or July. Pt has been spotting off and on all month. Pt states there may be a smell or this may just be normal smell. PT has not been seen by a provider since the birth of her daughter April 2022 Frequency: Ongoing Pertinent Negatives: Patient denies fever Disposition: [] ED /[] Urgent Care (no appt availability in office) / [] Appointment(In office/virtual)/ []  [facility]/ [] Home Care/ [] Refused Recommended Disposition /[] [place] [facility]/ [x]  Follow-up with PCP Additional Notes: Pt does not have a PCP. [facility] Pt will call to set appointment and start care.  PT will call back if unsuccessful wit getting an appointment.   Summary: Vaginal bleeding   Pt states she has had a constant light vaginal bleeding since having her child in April 2022. She states that it recently started having an odor. Pt is really worried about her health. Please advise pt.      Answer Assessment - Initial Assessment Questions 1. AMOUNT: "Describe the bleeding that you are having."    - SPOTTING: spotting, or pinkish / brownish mucous discharge; does not fill panty liner or pad    - MILD:  less than 1 pad / hour; less than patient's usual menstrual bleeding   - MODERATE: 1-2 pads / hour; 1 menstrual cup every 6 hours; small-medium blood clots (e.g., pea, grape, small coin)   - SEVERE: soaking 2 or more pads/hour for 2 or more hours; 1 menstrual cup every 2 hours; bleeding not contained by pads or continuous red blood from vagina; large blood clots (e.g., golf ball, large coin)      Off and on 2. ONSET: "When did the bleeding begin?" "Is it continuing now?"     July 3. MENSTRUAL PERIOD: "When was the last normal menstrual period?" "How is this different than your period?"     Had period last month. None this  month 4. REGULARITY: "How regular are your periods?"     not 5. ABDOMEN PAIN: "Do you have any pain?" "How bad is the pain?"  (e.g., Scale 1-10; mild, moderate, or severe)   - MILD (1-3): doesn't interfere with normal activities, abdomen soft and not tender to touch    - MODERATE (4-7): interferes with normal activities or awakens from sleep, abdomen tender to touch    - SEVERE (8-10): excruciating pain, doubled over, unable to do any normal activities      no 6. PREGNANCY: "Is there any chance you are pregnant?" "When was your last menstrual period?"     unknown 7. BREASTFEEDING: "Are you breastfeeding?"     no 8. HORMONE MEDICINES: "Are you taking any hormone medicines, prescription or over-the-counter?" (e.g., birth control pills, estrogen)     no 9. BLOOD THINNER MEDICINES: "Do you take any blood thinners?" (e.g., Coumadin / warfarin, Pradaxa / dabigatran, aspirin)     no 10. CAUSE: "What do you think is causing the bleeding?" (e.g., recent gyn surgery, recent gyn procedure; known bleeding disorder, cervical cancer, polycystic ovarian disease, fibroids)         unknown 11. HEMODYNAMIC STATUS: "Are you weak or feeling lightheaded?" If Yes, ask: "Can you stand and walk normally?"        no 12. OTHER SYMPTOMS: "What other symptoms are you having with the bleeding?" (e.g., passed tissue, vaginal discharge, fever, menstrual-type cramps)  no  Protocols used: Vaginal Bleeding - Abnormal-A-AH

## 2023-05-15 ENCOUNTER — Ambulatory Visit (HOSPITAL_COMMUNITY)
Admission: EM | Admit: 2023-05-15 | Discharge: 2023-05-15 | Disposition: A | Discharge status: Other Acute Inpatient Hospital | Payer: MEDICAID | Attending: Licensed Clinical Social Worker | Admitting: Licensed Clinical Social Worker

## 2023-05-15 ENCOUNTER — Emergency Department (HOSPITAL_COMMUNITY)
Admission: EM | Admit: 2023-05-15 | Discharge: 2023-05-16 | Disposition: A | Discharge status: Other Acute Inpatient Hospital | Payer: MEDICAID | Attending: Emergency Medicine | Admitting: Emergency Medicine

## 2023-05-15 ENCOUNTER — Encounter (HOSPITAL_COMMUNITY): Payer: Self-pay

## 2023-05-15 ENCOUNTER — Other Ambulatory Visit: Payer: Self-pay

## 2023-05-15 DIAGNOSIS — Z91040 Latex allergy status: Secondary | ICD-10-CM | POA: Insufficient documentation

## 2023-05-15 DIAGNOSIS — I10 Essential (primary) hypertension: Secondary | ICD-10-CM | POA: Insufficient documentation

## 2023-05-15 DIAGNOSIS — F250 Schizoaffective disorder, bipolar type: Secondary | ICD-10-CM | POA: Insufficient documentation

## 2023-05-15 DIAGNOSIS — Z20822 Contact with and (suspected) exposure to covid-19: Secondary | ICD-10-CM | POA: Diagnosis not present

## 2023-05-15 DIAGNOSIS — Z91148 Patient's other noncompliance with medication regimen for other reason: Secondary | ICD-10-CM | POA: Insufficient documentation

## 2023-05-15 DIAGNOSIS — F99 Mental disorder, not otherwise specified: Secondary | ICD-10-CM | POA: Insufficient documentation

## 2023-05-15 LAB — CBC WITH DIFFERENTIAL/PLATELET
Abs Immature Granulocytes: 0.01 10*3/uL (ref 0.00–0.07)
Basophils Absolute: 0.1 10*3/uL (ref 0.0–0.1)
Basophils Relative: 1 %
Eosinophils Absolute: 0 10*3/uL (ref 0.0–0.5)
Eosinophils Relative: 0 %
HCT: 39.6 % (ref 36.0–46.0)
Hemoglobin: 13.1 g/dL (ref 12.0–15.0)
Immature Granulocytes: 0 %
Lymphocytes Relative: 41 %
Lymphs Abs: 2.9 10*3/uL (ref 0.7–4.0)
MCH: 29.7 pg (ref 26.0–34.0)
MCHC: 33.1 g/dL (ref 30.0–36.0)
MCV: 89.8 fL (ref 80.0–100.0)
Monocytes Absolute: 0.7 10*3/uL (ref 0.1–1.0)
Monocytes Relative: 10 %
Neutro Abs: 3.3 10*3/uL (ref 1.7–7.7)
Neutrophils Relative %: 48 %
Platelets: 273 10*3/uL (ref 150–400)
RBC: 4.41 MIL/uL (ref 3.87–5.11)
RDW: 12.5 % (ref 11.5–15.5)
WBC: 7 10*3/uL (ref 4.0–10.5)
nRBC: 0 % (ref 0.0–0.2)

## 2023-05-15 LAB — COMPREHENSIVE METABOLIC PANEL
ALT: 12 U/L (ref 0–44)
AST: 13 U/L — ABNORMAL LOW (ref 15–41)
Albumin: 4.6 g/dL (ref 3.5–5.0)
Alkaline Phosphatase: 76 U/L (ref 38–126)
Anion gap: 12 (ref 5–15)
BUN: 8 mg/dL (ref 6–20)
CO2: 22 mmol/L (ref 22–32)
Calcium: 9.5 mg/dL (ref 8.9–10.3)
Chloride: 104 mmol/L (ref 98–111)
Creatinine, Ser: 0.92 mg/dL (ref 0.44–1.00)
GFR, Estimated: >60 mL/min (ref >60)
Glucose, Bld: 110 mg/dL — ABNORMAL HIGH (ref 70–99)
Potassium: 3 mmol/L — ABNORMAL LOW (ref 3.5–5.1)
Sodium: 138 mmol/L (ref 135–145)
Total Bilirubin: 0.8 mg/dL (ref 0.3–1.2)
Total Protein: 8.1 g/dL (ref 6.5–8.1)

## 2023-05-15 LAB — RAPID URINE DRUG SCREEN, HOSP PERFORMED
Amphetamines: NOT DETECTED
Barbiturates: NOT DETECTED
Benzodiazepines: NOT DETECTED
Cocaine: NOT DETECTED
Opiates: NOT DETECTED
Tetrahydrocannabinol: POSITIVE — AB

## 2023-05-15 LAB — ETHANOL: Alcohol, Ethyl (B): <10 mg/dL (ref <10)

## 2023-05-15 LAB — SALICYLATE LEVEL: Salicylate Lvl: <7 mg/dL — ABNORMAL LOW (ref 7.0–30.0)

## 2023-05-15 LAB — ACETAMINOPHEN LEVEL: Acetaminophen (Tylenol), Serum: <10 ug/mL — ABNORMAL LOW (ref 10–30)

## 2023-05-15 LAB — HCG, QUANTITATIVE, PREGNANCY: hCG, Beta Chain, Quant, S: 14 m[IU]/mL — ABNORMAL HIGH (ref <5)

## 2023-05-15 LAB — PREGNANCY, URINE: Preg Test, Ur: NEGATIVE

## 2023-05-15 MED ORDER — LORAZEPAM 1 MG PO TABS
1.0000 mg | ORAL_TABLET | Freq: Once | ORAL | Status: AC | PRN
  Administered 2023-05-15: 1 mg via ORAL
  Filled 2023-05-15: qty 1

## 2023-05-15 MED ORDER — OLANZAPINE 5 MG PO TBDP
15.0000 mg | ORAL_TABLET | Freq: Every day | ORAL | Status: DC
  Administered 2023-05-15: 15 mg via ORAL
  Filled 2023-05-15: qty 1

## 2023-05-15 MED ORDER — POTASSIUM CHLORIDE CRYS ER 20 MEQ PO TBCR
40.0000 meq | EXTENDED_RELEASE_TABLET | Freq: Once | ORAL | Status: AC
  Administered 2023-05-15: 40 meq via ORAL
  Filled 2023-05-15: qty 2

## 2023-05-15 MED ORDER — BENZTROPINE MESYLATE 0.5 MG PO TABS
0.5000 mg | ORAL_TABLET | Freq: Every day | ORAL | Status: DC
  Administered 2023-05-15: 0.5 mg via ORAL
  Filled 2023-05-15: qty 1

## 2023-05-15 MED ORDER — NIFEDIPINE ER OSMOTIC RELEASE 60 MG PO TB24
90.0000 mg | ORAL_TABLET | Freq: Every day | ORAL | Status: DC
  Administered 2023-05-15 – 2023-05-16 (×2): 90 mg via ORAL
  Filled 2023-05-15 (×2): qty 1

## 2023-05-15 MED ORDER — METOPROLOL SUCCINATE ER 25 MG PO TB24
25.0000 mg | ORAL_TABLET | Freq: Every day | ORAL | Status: DC
  Administered 2023-05-15 – 2023-05-16 (×2): 25 mg via ORAL
  Filled 2023-05-15 (×2): qty 1

## 2023-05-15 MED ORDER — BENZTROPINE MESYLATE 1 MG/ML IJ SOLN
0.5000 mg | Freq: Two times a day (BID) | INTRAMUSCULAR | Status: DC
  Administered 2023-05-15: 0.5 mg via INTRAMUSCULAR

## 2023-05-15 MED ORDER — POTASSIUM CHLORIDE CRYS ER 20 MEQ PO TBCR
20.0000 meq | EXTENDED_RELEASE_TABLET | Freq: Every day | ORAL | Status: DC
  Administered 2023-05-16: 20 meq via ORAL
  Filled 2023-05-15: qty 1

## 2023-05-15 MED ORDER — HALOPERIDOL 5 MG PO TABS
5.0000 mg | ORAL_TABLET | Freq: Two times a day (BID) | ORAL | Status: DC
  Administered 2023-05-15 – 2023-05-16 (×2): 5 mg via ORAL
  Filled 2023-05-15 (×2): qty 1

## 2023-05-15 NOTE — ED Provider Notes (Incomplete)
[place] EMERGENCY DEPARTMENT AT [facility] Provider Note   CSN: [id number] Arrival date & time: 05/15/23  2036     History {Add pertinent medical, surgical, social history, OB history to HPI:1} Chief Complaint  Patient presents with   IVC    Alyssa Mathis is a 37 y.o. female.  HPI     Home Medications Prior to Admission medications   Medication Sig Start Date End Date Taking? Authorizing Provider  benztropine (COGENTIN) 0.5 MG tablet Take 1 tablet (0.5 mg total) by mouth 2 (two) times daily. 04/02/22   [name], [name], MD  docusate sodium (COLACE) 100 MG capsule Take 1 capsule (100 mg total) by mouth 2 (two) times daily. 04/02/22   [name], [name], MD  haloperidol (HALDOL) 5 MG tablet Take 1 tablet (5 mg total) by mouth 2 (two) times daily. 04/03/22   [name], [name], MD  hydrOXYzine (ATARAX) 25 MG tablet Take 1 tablet (25 mg total) by mouth 3 (three) times daily as needed for anxiety. 04/02/22   [name], [name], MD  lithium carbonate (ESKALITH) 450 MG CR tablet Take 1 tablet (450 mg total) by mouth every 12 (twelve) hours. 04/02/22   [name], [name], MD  metoprolol succinate (TOPROL-XL) 25 MG 24 hr tablet Take 1 tablet (25 mg total) by mouth daily. 04/03/22   [name], [name], MD  nicotine polacrilex (NICORETTE) 2 MG gum Take 1 each (2 mg total) by mouth as needed for smoking cessation. 04/02/22   [name], [name], MD  NIFEdipine (PROCARDIA XL/NIFEDICAL-XL) 90 MG 24 hr tablet Take 1 tablet (90 mg total) by mouth daily. 04/03/22   [name], [name], MD  OLANZapine (ZYPREXA) 15 MG tablet Take 1 tablet (15 mg total) by mouth at bedtime. 04/02/22   [name], [name], MD      Allergies    Paliperidone and Latex    Review of Systems   Review of Systems  Physical Exam Updated Vital Signs BP (!) 177/141 (BP Location: Right Arm)   Pulse (!) 105   Resp 17   Ht 5\' 5"  (1.651 m)   Wt 72.6 kg   SpO2 94%   BMI 26.63 kg/m  Physical Exam  ED Results / Procedures / Treatments    Labs (all labs ordered are listed, but only abnormal results are displayed) Labs Reviewed  COMPREHENSIVE METABOLIC PANEL  ETHANOL  RAPID URINE DRUG SCREEN, HOSP PERFORMED  CBC WITH DIFFERENTIAL/PLATELET  ACETAMINOPHEN LEVEL  SALICYLATE LEVEL  HCG, QUANTITATIVE, PREGNANCY    EKG None  Radiology No results found.  Procedures Procedures  {Document cardiac monitor, telemetry assessment procedure when appropriate:1}  Medications Ordered in ED Medications - No data to display  ED Course/ Medical Decision Making/ A&P   {   Click here for ABCD2, HEART and other calculatorsREFRESH Note before signing :1}                              Medical Decision Making Amount and/or Complexity of Data Reviewed Labs: ordered.   ***  {Document critical care time when appropriate:1} {Document review of labs and clinical decision tools ie heart score, Chads2Vasc2 etc:1}  {Document your independent review of radiology images, and any outside records:1} {Document your discussion with family members, caretakers, and with consultants:1} {Document social determinants of health affecting pt's care:1} {Document your decision making why or why not admission, treatments were needed:1} Final Clinical Impression(s) / ED Diagnoses Final diagnoses:  None    Rx / DC Orders ED Discharge Orders     None

## 2023-05-15 NOTE — Progress Notes (Signed)
   05/15/23 1807  [facility] Triage Screening (Walk-ins at [facility] only)  How Did You Hear About [name]? Family/Friend  What Is the Reason for Your Visit/Call Today? Pt presents to [facility] voluntarily accompanied by her cousin. Pt is incoherent and not able to speak clearly. Pt is stating "I have had an abortion and I have pain". Pt reports that she has worsening anxiety and is unable to sit still. Pts father stated, "she had an abortion two weeks ago" Pt states quietly, "I need help". Pt kept saying, "I need medication refill". Pt denies substance use, SI, HI and AVH.  How Long Has This Been Causing You Problems? <Week  Have You Recently Had Any Thoughts About Hurting Yourself? No  Are You Planning to Commit Suicide/Harm Yourself At This time? No  Have you Recently Had Thoughts About Hurting Someone [name]? No  Are You Planning To Harm Someone At This Time? No  Are you currently experiencing any auditory, visual or other hallucinations? No  Have You Used Any Alcohol or Drugs in the Past 24 Hours? No  Do you have any current medical co-morbidities that require immediate attention? No  Clinician description of patient physical appearance/behavior: pacing, slurred speech, incoherent  What Do You Feel Would Help You the Most Today? Social Support;Stress Management  If access to [facility]gent [facility] was not available, would you have sought care in the Emergency Department? No  Determination of Need Urgent (48 hours)  Options For Referral Inpatient Hospitalization;Medication Management

## 2023-05-15 NOTE — Progress Notes (Signed)
Pt was accepted to [facility] 05/16/2023; Bed Assignment Main Campus PENDING Negative COVID faxed to [facility] Fax Number: [phone] (Adult)/ [phone].   Pt meets inpatient criteria per Lissa Hoard     Attending Physician will be Dr. Dianna Limbo Madaram,MD      Report can be called to:[phone]-Pager number, please leave a returned phone number to receive a phone call back.    Pt can arrive after 9:00am     Maryjean Ka, MSW, [facility] 05/15/2023 10:45 PM

## 2023-05-15 NOTE — ED Notes (Signed)
Call from Hale RN at [facility] pt has been accepted to their facility no room or accepting physican  information available at this time.

## 2023-05-15 NOTE — ED Provider Notes (Signed)
Behavioral Health Urgent Care Medical Screening Exam  Patient Name: Manvir Buan MRN: [id number] Date of Evaluation: 05/15/23 Chief Complaint:   Diagnosis:  Final diagnoses:  Schizoaffective disorder, bipolar type (HCC)    History of Present illness: Mykelti Mada is a 37 y.o. female.  Patient presents voluntarily to [facility] behavioral health.  Patient is accompanied by Laurence Slate, a cousin, with whom she resides.  Patient is assessed by this nurse practitioner face-to-face.  She is seated in assessment area.  She is alert, pleasant and cooperative during assessment.  She presents with dysphoric mood, congruent affect.  Chart reviewed and patient discussed with Dr. Nelly Rout on 05/15/2023.  Patient appears to be potentially responding to internal stimuli.  Thought content disorganized and tangential.  Patient noted to exhibit pauses prior to responding, likely thought blocking.  She denies auditory and visual hallucinations.  She is a limited historian at this time. Patient is unable to articulate reason for presentation.  Patient states "I hope I am not having a miscarriage." No reported vaginal bleeding / vaginal discharge. Unable to recall LMP, likely related to psychosis.   Miscarriage possible fixed delusion. Per medical record patient evaluated with similar presentation approximately 1 month ago.  Patient unable to identify recent stressors.  Recent stressors, as shared by cousin, Laurence Slate, include patient's child's father who passed away this week.  Per cousin, with whom she resides, patient up all night.  Patient standing over cousin in the night, behavior increasingly bizarre for several days.   Per chart review previous diagnoses include schizoaffective disorder, bipolar type and postpartum depression.  Patient unable to identify current outpatient psychiatry provider.  Patient reportedly intermittently compliant with medications.  She has unable to recall most  recent medications aside from 1 dose last night.  Multiple previous inpatient psychiatric hospitalizations.  Most recent psychiatric hospitalization at [name] behavioral health at [facility] in June 2023.  No family mental health or addiction history reported.  Patient and her boyfriend reside with a cousin along with patient's children.  Patient's children safe with her cousin at this time.  Patient's boyfriend will pick up children after work today.  Per cousin patient sleeping very little for several days.  Patient and cousin deny any alcohol or substance use.  Patient offered support and encouragement.  She verbalizes understanding of treatment plan to include medical clearance and emergency department prior to inpatient psychiatric admission.  Reviewed involuntary commitment, patient verbalized understanding.  Patient's cousin, Laurence Slate phone number [phone].  Patient's boyfriend [name] phone number [phone].    Patient offered support and encouragement.  Reviewed treatment plan to include involuntary commitment petition and recommendation for inpatient psychiatric treatment.  Reviewed recommendation that patient be medically cleared in emergency department, patient verbalizes understanding.  Flowsheet Row ED from 05/15/2023 in [facility] Admission (Discharged) from 03/13/2022 in [facility] INPATIENT BEHAVIORAL MEDICINE ED from 03/11/2022 in [facility] Emergency Department at [facility]  C-SSRS RISK CATEGORY No Risk No Risk No Risk       Psychiatric Specialty Exam  Presentation  General Appearance:Appropriate for Environment; Casual  Eye Contact:Fair  Speech:Clear and Coherent  Speech Volume:Normal  Handedness:Right   Mood and Affect  Mood: Dysphoric  Affect: Restricted   Thought Process  Thought Processes: Disorganized  Descriptions of Associations:Tangential  Orientation:Full (Time, Place and Person)  Thought  Content:Tangential  Diagnosis of Schizophrenia or Schizoaffective disorder in past: No data recorded Duration of Psychotic Symptoms: No data recorded Hallucinations:None  Ideas of  Reference:None  Suicidal Thoughts:No  Homicidal Thoughts:No   Sensorium  Memory: Immediate Poor  Judgment: Impaired  Insight: Lacking   Executive Functions  Concentration: Fair  Attention Span: Fair  Recall: Fair  Fund of Knowledge: Good  Language: Good   Psychomotor Activity  Psychomotor Activity: Normal   Assets  Assets: Communication Skills; Desire for Improvement; Housing; Resilience; Social Support   Sleep  Sleep: Poor  Number of hours: No data recorded  Physical Exam: Physical Exam Vitals and nursing note reviewed.  Constitutional:      Appearance: Normal appearance. She is well-developed.  HENT:     Head: Normocephalic and atraumatic.     Nose: Nose normal.  Cardiovascular:     Rate and Rhythm: Normal rate.  Pulmonary:     Effort: Pulmonary effort is normal.  Musculoskeletal:        General: Normal range of motion.     Cervical back: Normal range of motion.  Skin:    General: Skin is warm and dry.  Neurological:     Mental Status: She is alert and oriented to person, place, and time.  Psychiatric:        Attention and Perception: Attention normal.        Mood and Affect: Affect is inappropriate.        Speech: Speech is tangential.        Behavior: Behavior is cooperative.        Thought Content: Thought content is delusional. Thought content is not paranoid.        Cognition and Memory: Cognition normal.    Review of Systems  Constitutional: Negative.   HENT: Negative.    Eyes: Negative.   Respiratory: Negative.    Cardiovascular: Negative.   Gastrointestinal: Negative.   Genitourinary: Negative.   Musculoskeletal: Negative.   Skin: Negative.   Neurological: Negative.   Psychiatric/Behavioral:  The patient is nervous/anxious and has  insomnia.    Blood pressure (!) 180/90, pulse 85, temperature 98.1 F (36.7 C), temperature source Oral, resp. rate 18, SpO2 100%, unknown if currently breastfeeding. There is no height or weight on file to calculate BMI.  Musculoskeletal: Strength & Muscle Tone: within normal limits Gait & Station: normal Patient leans: N/A   [facility] MSE Discharge Disposition for Follow up and Recommendations: Based on my evaluation the patient appears to have an emergency medical condition for which I recommend the patient be transferred to the emergency department for further evaluation.   Involuntary commitment petition initiated by this Clinical research associate.  Inpatient psychiatric treatment recommended once medically cleared.   Patient accepted to [facility] emergency department by Dr. Renaye Rakers and for medical clearance.  Patient is appropriate to return to [facility] behavioral health once medically cleared.   Lenard Lance, FNP 05/15/2023, 7:04 PM

## 2023-05-15 NOTE — ED Provider Notes (Addendum)
EMERGENCY DEPARTMENT AT [facility] Provider Note   CSN: [id number] Arrival date & time: 05/15/23  2036     History  Chief Complaint  Patient presents with   IVC    Alyssa Mathis is a 37 y.o. female who presents from behavioral health under IVC for erratic behavior, concern for hallucinations, disorganized thoughts.  Patient repeatedly stating "I hope I am not having a miscarriage" when she presented to behavioral health.  She does have a history of a recent pregnancy and reported spontaneous miscarriage, per my review of external records, had a pelvic ultrasound performed on 03/20/23 at [facility] showing single live 7 week IUP.  Patient reports that since then she has had vaginal bleeding, spotting, on and off.  She cannot provide any further history to me, is very slow to respond and has disorganized thoughts otherwise.  HPI     Home Medications Prior to Admission medications   Medication Sig Start Date End Date Taking? Authorizing Provider  benztropine (COGENTIN) 0.5 MG tablet Take 1 tablet (0.5 mg total) by mouth 2 (two) times daily. 04/02/22   [name], [name], MD  docusate sodium (COLACE) 100 MG capsule Take 1 capsule (100 mg total) by mouth 2 (two) times daily. 04/02/22   [name], [name], MD  haloperidol (HALDOL) 5 MG tablet Take 1 tablet (5 mg total) by mouth 2 (two) times daily. 04/03/22   [name], [name], MD  hydrOXYzine (ATARAX) 25 MG tablet Take 1 tablet (25 mg total) by mouth 3 (three) times daily as needed for anxiety. 04/02/22   [name], [name], MD  lithium carbonate (ESKALITH) 450 MG CR tablet Take 1 tablet (450 mg total) by mouth every 12 (twelve) hours. 04/02/22   [name], [name], MD  metoprolol succinate (TOPROL-XL) 25 MG 24 hr tablet Take 1 tablet (25 mg total) by mouth daily. 04/03/22   [name], [name], MD  nicotine polacrilex (NICORETTE) 2 MG gum Take 1 each (2 mg total) by mouth as needed for smoking cessation. 04/02/22   [name], [name], MD   NIFEdipine (PROCARDIA XL/NIFEDICAL-XL) 90 MG 24 hr tablet Take 1 tablet (90 mg total) by mouth daily. 04/03/22   [name], [name], MD  OLANZapine (ZYPREXA) 15 MG tablet Take 1 tablet (15 mg total) by mouth at bedtime. 04/02/22   [name], [name], MD      Allergies    Paliperidone and Latex    Review of Systems   Review of Systems  Physical Exam Updated Vital Signs BP (!) 178/111   Pulse 97   Temp 98.1 F (36.7 C) (Oral)   Resp 18   Ht 5\' 5"  (1.651 m)   Wt 72.6 kg   SpO2 99%   BMI 26.63 kg/m  Physical Exam Constitutional:      General: She is not in acute distress.    Comments: Slow speech, intermittently tearful  HENT:     Head: Normocephalic and atraumatic.  Eyes:     Conjunctiva/sclera: Conjunctivae normal.     Pupils: Pupils are equal, round, and reactive to light.  Cardiovascular:     Rate and Rhythm: Normal rate and regular rhythm.  Pulmonary:     Effort: Pulmonary effort is normal. No respiratory distress.  Abdominal:     General: There is no distension.     Tenderness: There is no abdominal tenderness.  Skin:    General: Skin is warm and dry.  Neurological:     General: No focal deficit  present.     Mental Status: She is alert. Mental status is at baseline.     ED Results / Procedures / Treatments   Labs (all labs ordered are listed, but only abnormal results are displayed) Labs Reviewed  COMPREHENSIVE METABOLIC PANEL - Abnormal; Notable for the following components:      Result Value   Potassium 3.0 (*)    Glucose, Bld 110 (*)    AST 13 (*)    All other components within normal limits  RAPID URINE DRUG SCREEN, HOSP PERFORMED - Abnormal; Notable for the following components:   Tetrahydrocannabinol POSITIVE (*)    All other components within normal limits  ACETAMINOPHEN LEVEL - Abnormal; Notable for the following components:   Acetaminophen (Tylenol), Serum <10 (*)    All other components within normal limits  SALICYLATE LEVEL - Abnormal; Notable for  the following components:   Salicylate Lvl <7.0 (*)    All other components within normal limits  HCG, QUANTITATIVE, PREGNANCY - Abnormal; Notable for the following components:   hCG, Beta Chain, Quant, S 14 (*)    All other components within normal limits  ETHANOL  CBC WITH DIFFERENTIAL/PLATELET  PREGNANCY, URINE    EKG EKG Interpretation Date/Time:  Wednesday May 15 2023 21:25:37 EDT Ventricular Rate:  76 PR Interval:  113 QRS Duration:  85 QT Interval:  384 QTC Calculation: 432 R Axis:   77  Text Interpretation: Sinus rhythm Probable left atrial enlargement Probable left ventricular hypertrophy Confirmed by Alvester Chou [phone]) on 05/15/2023 9:26:51 PM  Radiology No results found.  Procedures Procedures    Medications Ordered in ED Medications  NIFEdipine (PROCARDIA-XL/NIFEDICAL-XL) 24 hr tablet 90 mg (90 mg Oral Given 05/15/23 2224)  metoprolol succinate (TOPROL-XL) 24 hr tablet 25 mg (25 mg Oral Given 05/15/23 2209)  potassium chloride SA (KLOR-CON M) CR tablet 20 mEq (has no administration in time range)  OLANZapine zydis (ZYPREXA) disintegrating tablet 15 mg (15 mg Oral Given 05/15/23 2210)  haloperidol (HALDOL) tablet 5 mg (5 mg Oral Given 05/15/23 2219)  benztropine mesylate (COGENTIN) injection 0.5 mg (0.5 mg Intramuscular Not Given 05/15/23 2252)  benztropine (COGENTIN) tablet 0.5 mg (0.5 mg Oral Given 05/15/23 2227)  LORazepam (ATIVAN) tablet 1 mg (1 mg Oral Given 05/15/23 2209)  potassium chloride SA (KLOR-CON M) CR tablet 40 mEq (40 mEq Oral Given 05/15/23 2210)    ED Course/ Medical Decision Making/ A&P Clinical Course as of 05/15/23 2357  Wed May 15, 2023  2159 Hcg level of 14, quite minimal consistent with miscarriage [MT]    Clinical Course User Index [MT] Anthonyjames Bargar, Kermit Balo, MD                                 Medical Decision Making Amount and/or Complexity of Data Reviewed Labs: ordered.  Risk Prescription drug management.   This patient  presents to the Emergency Department with complaint of psychiatric disturbance. This involves an extensive number of treatment options, and is a complaint that carries with it a high risk of complications and morbidity.   I ordered, reviewed, and interpreted labs, including BMP and CBC.  There were no immediate, life-threatening emergencies found in this labwork.  The patient was medically cleared for TTS and psychiatric evaluation.  At this time, the patient IS under IVC filed by [facility] provider.  Patient's pregnancy test is negative here in the ED.  This indicates that she likely had a  completed miscarriage.  Her blood pressure is elevated on arrival, which may be related to stress or underlying hypertension.  It is significantly elevated but she has been off of her medications.  We will order her chronic blood pressure medication and reassess  Additional history was obtained from [facility] provider, NP External records were reviewed including her pelvic ultrasound and pregnancy ultrasound performed [facility] as well as behind record from today I personally reviewed the patients ECG which showed sinus rhythm with no acute ischemic findings   Initially we had planned at this time the patient back to behavioral health as she is medical cleared, however we were notified the patient will have admission bed tomorrow morning at [facility], and therefore can be monitored her overnight with her blood pressure with the current medications.  She will need to continue daily potassium for the next 10 days (20 meq) and continue her metoprolol 25 mg XL daily and and her procadria XL 24 hour tablet 90 mg daily, which were ordered here.       Final Clinical Impression(s) / ED Diagnoses Final diagnoses:  Hypertension, unspecified type  Psychiatric disturbance    Rx / DC Orders ED Discharge Orders     None         Kalana Yust, Kermit Balo, MD 05/15/23 1610    Terald Sleeper, MD 05/15/23 [phone]

## 2023-05-15 NOTE — ED Provider Triage Note (Signed)
Emergency Medicine Provider Triage Evaluation Note  Alyssa Mathis , a 37 y.o. female  was evaluated in triage.  Pt complains of psychosis. History is limited 2/2 psych disorder. She was sent over from [facility] for medical clearance. Per [facility] note, patient is to return to [facility] when medically clear. From chart evaluation, it appears that the patient was pregnant at her 03/20/23 visit to the ER. She thinks she had an abortion in July or June but can not remember. She reports visual hallucinations, but denies auditory hallucinations. IVC paperwork filled out and completed by [facility]. Patient reports non compliant with her medications for a month.  Review of Systems  Positive:  Negative:   Physical Exam  BP (!) 177/141 (BP Location: Right Arm)   Pulse (!) 105   Resp 17   Ht 5\' 5"  (1.651 m)   Wt 72.6 kg   SpO2 94%   BMI 26.63 kg/m  Gen:   Awake, no distress   Resp:  Normal effort  MSK:   Moves extremities without difficulty  Other:    Medical Decision Making  Medically screening exam initiated at 9:06 PM.  Appropriate orders placed.  Azula Cubero was informed that the remainder of the evaluation will be completed by another provider, this initial triage assessment does not replace that evaluation, and the importance of remaining in the ED until their evaluation is complete.  Patient is hypertensive. No somatic complaints. Will order hcg quant for possible pregnancy. Concerned for the patient's elevated BP, but it appears she has had a h/o gestational HTN in the past.    Achille Rich, Cordelia Poche 05/15/23 2110

## 2023-05-15 NOTE — ED Notes (Signed)
Called [facility] for transport to [facility]

## 2023-05-15 NOTE — Progress Notes (Signed)
LCSW Progress Note  [id number]   Allyzon Dubbs  05/15/2023  9:47 PM    Inpatient Behavioral Health Placement  Pt meets inpatient criteria per Doran Heater, NP. There are no available beds within [facility] system per [facility] AC Kelly Southard,RN. Referral was sent to the following facilities;   Destination  Service Provider Address Phone [facility]  [address] [place], [place] [address] [phone]  [facility] [address]  [place] [address] [phone]  [facility]  [address] [place] [address] [phone]  [facility]  [address] [place], [place] [address] [phone]  [facility]  [address], [place] [address] [phone]  [facility]  [address] [place] [address] [phone]  [facility]  [address] [place], [place] [address] [phone]  [facility]  [address] [place] [address] [phone]  [facility]  [address] [place] [address] [phone]  [facility]  [address] [place] [address] [phone]  [facility]  [address], [place] [address] [phone]  [facility]  [address] [place] [address] [phone]  [facility]  [address] [place] [address] [phone]  [facility]  [address], [place] [address] [phone]  [facility] Health  [place] [phone]  [facility]  [address] [place] [address] [phone]  [facility] EFAX  [address]  [name] [place] [phone]  [facility]  [address] [place] [address] [phone]  [facility]  [name] [place] [address] [phone]  [facility]  [address] [place] [address] [phone]  [facility]  [address], [place] [address] [phone]  [facility]  [address], [place] [address] [phone]  [facility]  [address] [place] [address] [phone]  [facility]  [address], [place] [address] [phone]  [facility]  [address] [place] [address] [phone]  [facility] Patient Placement  [facility], [place] [phone]  [facility]  [address] [place] [address] [phone]  [facility] [address]  [address], [place] [address] [phone]  [facility] In[facility] EFAX  [place] [phone]    Situation ongoing,  CSW will follow up.    Maryjean Ka, MSW, [facility] 05/15/2023 9:47 PM

## 2023-05-15 NOTE — ED Notes (Signed)
Report given to [facility] RN@[facility]  ed

## 2023-05-15 NOTE — ED Triage Notes (Signed)
Arrives [facility] from [facility] under IVC.   Reported that pt is responding to internal stimuli, noncompliance with medications r/t mental health history.   Recent stressors at home with her child's father passing away this week.
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
## 2023-05-16 LAB — RESP PANEL BY RT-PCR (RSV, FLU A&B, COVID)  RVPGX2
Influenza A by PCR: NEGATIVE
Influenza B by PCR: NEGATIVE
Resp Syncytial Virus by PCR: NEGATIVE
SARS Coronavirus 2 by RT PCR: NEGATIVE

## 2023-05-16 MED ORDER — BENZTROPINE MESYLATE 0.5 MG PO TABS
0.5000 mg | ORAL_TABLET | Freq: Two times a day (BID) | ORAL | Status: DC
  Administered 2023-05-16: 0.5 mg via ORAL
  Filled 2023-05-16: qty 1

## 2023-05-16 MED ORDER — BENZTROPINE MESYLATE 0.5 MG PO TABS: 0.5000 mg | ORAL_TABLET | Freq: Two times a day (BID) | ORAL | Status: DC

## 2023-05-16 NOTE — ED Notes (Signed)
 Pt remains asleep with no signs of distress respirations appear easy.

## 2023-05-16 NOTE — ED Notes (Signed)
Pt was accepted to [facility] 05/16/2023; Bed Assignment Main Campus PENDING Negative COVID faxed to [facility] Fax Number: [phone] (Adult)/ [phone]. Pt meets inpatient criteria per Lissa Hoard Attending Physician will be Dr. Dianna Limbo Madaram,MD Report can be called to:[phone]-Pager number, please leave a returned phone number to receive a phone call back.  Pt can arrive after 9:00am

## 2023-05-16 NOTE — ED Notes (Signed)
Alyssa Mathis currently is asleep with no signs of distress or pain noted respirations appear easy as nonted by the rise and fall of her chest.

## 2023-05-16 NOTE — ED Provider Notes (Signed)
Emergency Medicine Observation Re-evaluation Note  Alyssa Mathis is a 37 y.o. female, seen on rounds today.  Pt initially presented to the ED for complaints of IVC Currently, the patient is sitting in her bed. Limited evaluation this morning as she shakes her head yes/no but won't speak to me.  Physical Exam  BP (!) 152/92   Pulse 72   Temp 98 F (36.7 C)   Resp 18   Ht 5\' 5"  (1.651 m)   Wt 72.6 kg   SpO2 98%   BMI 26.63 kg/m  Physical Exam General: no acute distress Lungs: normal effort Psych: not speakign  ED Course / MDM  EKG:EKG Interpretation Date/Time:  Wednesday May 15 2023 21:25:37 EDT Ventricular Rate:  76 PR Interval:  113 QRS Duration:  85 QT Interval:  384 QTC Calculation: 432 R Axis:   77  Text Interpretation: Sinus rhythm Probable left atrial enlargement Probable left ventricular hypertrophy Confirmed by Alvester Chou [phone]) on 05/15/2023 9:26:51 PM  I have reviewed the labs performed to date as well as medications administered while in observation.  Recent changes in the last 24 hours include home meds being ordered.  Plan  Current plan is for Transfer to [facility].    Pricilla Loveless, MD 05/16/23 1050

## 2023-05-16 NOTE — ED Notes (Signed)
COVID results faxed to [facility] results are negative.

## 2023-06-14 ENCOUNTER — Emergency Department
Admission: EM | Admit: 2023-06-14 | Discharge: 2023-06-14 | Disposition: A | Discharge status: Home / Self Care | Payer: MEDICAID | Attending: Emergency Medicine | Admitting: Emergency Medicine

## 2023-06-14 ENCOUNTER — Other Ambulatory Visit: Payer: Self-pay

## 2023-06-14 DIAGNOSIS — B9689 Other specified bacterial agents as the cause of diseases classified elsewhere: Secondary | ICD-10-CM | POA: Insufficient documentation

## 2023-06-14 DIAGNOSIS — Z113 Encounter for screening for infections with a predominantly sexual mode of transmission: Secondary | ICD-10-CM | POA: Diagnosis present

## 2023-06-14 DIAGNOSIS — N760 Acute vaginitis: Secondary | ICD-10-CM | POA: Diagnosis not present

## 2023-06-14 LAB — WET PREP, GENITAL
Sperm: NONE SEEN
Trich, Wet Prep: NONE SEEN
WBC, Wet Prep HPF POC: >10 — AB (ref <10)
Yeast Wet Prep HPF POC: NONE SEEN

## 2023-06-14 LAB — URINALYSIS, COMPLETE (UACMP) WITH MICROSCOPIC
Bacteria, UA: NONE SEEN
Bilirubin Urine: NEGATIVE
Glucose, UA: NEGATIVE mg/dL
Ketones, ur: 20 mg/dL — AB
Leukocytes,Ua: NEGATIVE
Nitrite: NEGATIVE
Protein, ur: NEGATIVE mg/dL
Specific Gravity, Urine: 1.023 (ref 1.005–1.030)
pH: 6 (ref 5.0–8.0)

## 2023-06-14 LAB — CHLAMYDIA/NGC RT PCR (ARMC ONLY)
Chlamydia Tr: NOT DETECTED
N gonorrhoeae: NOT DETECTED

## 2023-06-14 LAB — POC URINE PREG, ED: Preg Test, Ur: NEGATIVE

## 2023-06-14 MED ORDER — METRONIDAZOLE 500 MG PO TABS: 500.0000 mg | ORAL_TABLET | Freq: Two times a day (BID) | ORAL | 0 refills | Status: AC

## 2023-06-14 NOTE — ED Notes (Signed)
This RN to ask patient if she felt safe in her home. Patient stated that she did not have a "home." In addition, nurse asked patient if any persons was harming her or the child that was in her presence, but patient did not respond.

## 2023-06-14 NOTE — ED Notes (Signed)
Pt was provided a cab voucher and left in cab to [employer].

## 2023-06-14 NOTE — ED Notes (Signed)
DSS called for welfare check of child
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
## 2023-06-14 NOTE — ED Notes (Signed)
[employer] is here to speak with patient.

## 2023-06-14 NOTE — ED Notes (Signed)
Pt present in room with 35 year old son. TV is very loud, pt asking son to state her name and birthdate for her during blood draws. Pt appears dependent on child for help in some ways. Pt's son asked to sit next door during pelvic exam, and son began to have tears. Mother tried consoling and asked if pt could stay in the room, but the writer encouraged son to come to next room over and watch tv during exam. Pt and son open to this, and exam performed.

## 2023-06-14 NOTE — Discharge Instructions (Addendum)
The behavioral health center listed below has counseling resources available if you would like to speak to one in the future.  [facility] [address] [place], [place] [address] [phone]  Please take the antibiotics as prescribed.
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
## 2023-06-14 NOTE — ED Triage Notes (Signed)
Patient arrives at the ED requesting STD testing. Patient states that she had unprotected sex with a partner. Patient denies symptoms at this time.
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
## 2023-06-15 LAB — HIV ANTIBODY (ROUTINE TESTING W REFLEX): HIV Screen 4th Generation wRfx: NONREACTIVE

## 2023-06-15 LAB — RPR: RPR Ser Ql: NONREACTIVE

## 2023-06-19 ENCOUNTER — Other Ambulatory Visit: Payer: Self-pay

## 2023-06-19 ENCOUNTER — Emergency Department
Admission: EM | Admit: 2023-06-19 | Discharge: 2023-06-23 | Disposition: A | Discharge status: Other Acute Inpatient Hospital | Payer: MEDICAID | Attending: Emergency Medicine | Admitting: Emergency Medicine

## 2023-06-19 DIAGNOSIS — I10 Essential (primary) hypertension: Secondary | ICD-10-CM | POA: Insufficient documentation

## 2023-06-19 DIAGNOSIS — F259 Schizoaffective disorder, unspecified: Secondary | ICD-10-CM

## 2023-06-19 DIAGNOSIS — F250 Schizoaffective disorder, bipolar type: Secondary | ICD-10-CM | POA: Diagnosis present

## 2023-06-19 DIAGNOSIS — F17210 Nicotine dependence, cigarettes, uncomplicated: Secondary | ICD-10-CM | POA: Diagnosis not present

## 2023-06-19 LAB — COMPREHENSIVE METABOLIC PANEL
ALT: 14 U/L (ref 0–44)
AST: 18 U/L (ref 15–41)
Albumin: 4 g/dL (ref 3.5–5.0)
Alkaline Phosphatase: 73 U/L (ref 38–126)
Anion gap: 5 (ref 5–15)
BUN: 10 mg/dL (ref 6–20)
CO2: 27 mmol/L (ref 22–32)
Calcium: 8.9 mg/dL (ref 8.9–10.3)
Chloride: 108 mmol/L (ref 98–111)
Creatinine, Ser: 0.92 mg/dL (ref 0.44–1.00)
GFR, Estimated: >60 mL/min (ref >60)
Glucose, Bld: 116 mg/dL — ABNORMAL HIGH (ref 70–99)
Potassium: 3.1 mmol/L — ABNORMAL LOW (ref 3.5–5.1)
Sodium: 140 mmol/L (ref 135–145)
Total Bilirubin: 0.6 mg/dL (ref 0.3–1.2)
Total Protein: 7.6 g/dL (ref 6.5–8.1)

## 2023-06-19 LAB — URINE DRUG SCREEN, QUALITATIVE (ARMC ONLY)
Amphetamines, Ur Screen: NOT DETECTED
Barbiturates, Ur Screen: NOT DETECTED
Benzodiazepine, Ur Scrn: NOT DETECTED
Cannabinoid 50 Ng, Ur ~~LOC~~: NOT DETECTED
Cocaine Metabolite,Ur ~~LOC~~: NOT DETECTED
MDMA (Ecstasy)Ur Screen: NOT DETECTED
Methadone Scn, Ur: NOT DETECTED
Opiate, Ur Screen: NOT DETECTED
Phencyclidine (PCP) Ur S: NOT DETECTED
Tricyclic, Ur Screen: NOT DETECTED

## 2023-06-19 LAB — ETHANOL: Alcohol, Ethyl (B): <10 mg/dL (ref <10)

## 2023-06-19 LAB — CBC
HCT: 37.8 % (ref 36.0–46.0)
Hemoglobin: 12.3 g/dL (ref 12.0–15.0)
MCH: 29.7 pg (ref 26.0–34.0)
MCHC: 32.5 g/dL (ref 30.0–36.0)
MCV: 91.3 fL (ref 80.0–100.0)
Platelets: 258 10*3/uL (ref 150–400)
RBC: 4.14 MIL/uL (ref 3.87–5.11)
RDW: 12.2 % (ref 11.5–15.5)
WBC: 4.7 10*3/uL (ref 4.0–10.5)
nRBC: 0 % (ref 0.0–0.2)

## 2023-06-19 LAB — POC URINE PREG, ED: Preg Test, Ur: NEGATIVE

## 2023-06-19 LAB — SALICYLATE LEVEL: Salicylate Lvl: <7 mg/dL — ABNORMAL LOW (ref 7.0–30.0)

## 2023-06-19 LAB — LITHIUM LEVEL: Lithium Lvl: <0.06 mmol/L — ABNORMAL LOW (ref 0.60–1.20)

## 2023-06-19 LAB — ACETAMINOPHEN LEVEL: Acetaminophen (Tylenol), Serum: <10 ug/mL — ABNORMAL LOW (ref 10–30)

## 2023-06-19 MED ORDER — HALOPERIDOL 5 MG PO TABS
5.0000 mg | ORAL_TABLET | Freq: Three times a day (TID) | ORAL | Status: DC | PRN
  Administered 2023-06-19: 5 mg via ORAL

## 2023-06-19 MED ORDER — NIFEDIPINE ER OSMOTIC RELEASE 60 MG PO TB24
90.0000 mg | ORAL_TABLET | Freq: Every day | ORAL | Status: DC
  Administered 2023-06-19 – 2023-06-22 (×4): 90 mg via ORAL
  Filled 2023-06-19 (×9): qty 1

## 2023-06-19 MED ORDER — DIPHENHYDRAMINE HCL 25 MG PO CAPS: 50.0000 mg | ORAL_CAPSULE | Freq: Three times a day (TID) | ORAL | Status: DC | PRN

## 2023-06-19 MED ORDER — HALOPERIDOL LACTATE 5 MG/ML IJ SOLN: 5.0000 mg | Freq: Three times a day (TID) | INTRAMUSCULAR | Status: DC | PRN

## 2023-06-19 MED ORDER — DIPHENHYDRAMINE HCL 50 MG/ML IJ SOLN: 50.0000 mg | Freq: Three times a day (TID) | INTRAMUSCULAR | Status: DC | PRN

## 2023-06-19 MED ORDER — HALOPERIDOL 5 MG PO TABS
5.0000 mg | ORAL_TABLET | Freq: Two times a day (BID) | ORAL | Status: DC
  Administered 2023-06-20 – 2023-06-21 (×3): 5 mg via ORAL
  Filled 2023-06-19 (×4): qty 1

## 2023-06-19 MED ORDER — POTASSIUM CHLORIDE CRYS ER 20 MEQ PO TBCR
40.0000 meq | EXTENDED_RELEASE_TABLET | Freq: Once | ORAL | Status: AC
  Administered 2023-06-19: 40 meq via ORAL
  Filled 2023-06-19: qty 2

## 2023-06-19 MED ORDER — LORAZEPAM 2 MG PO TABS
2.0000 mg | ORAL_TABLET | Freq: Three times a day (TID) | ORAL | Status: DC | PRN
  Filled 2023-06-19: qty 1

## 2023-06-19 MED ORDER — HYDROCORTISONE 1 % EX OINT
TOPICAL_OINTMENT | Freq: Every day | CUTANEOUS | Status: DC
  Administered 2023-06-21: 1 via TOPICAL
  Filled 2023-06-19: qty 28.35

## 2023-06-19 MED ORDER — LORAZEPAM 2 MG PO TABS: 2.0000 mg | ORAL_TABLET | Freq: Three times a day (TID) | ORAL | Status: DC | PRN

## 2023-06-19 MED ORDER — BENZTROPINE MESYLATE 1 MG PO TABS
0.5000 mg | ORAL_TABLET | Freq: Two times a day (BID) | ORAL | Status: DC
  Administered 2023-06-19 – 2023-06-22 (×6): 0.5 mg via ORAL
  Filled 2023-06-19 (×8): qty 1

## 2023-06-19 MED ORDER — LITHIUM CARBONATE ER 300 MG PO TBCR
300.0000 mg | EXTENDED_RELEASE_TABLET | Freq: Two times a day (BID) | ORAL | Status: DC
  Administered 2023-06-19 – 2023-06-22 (×7): 300 mg via ORAL
  Filled 2023-06-19 (×9): qty 1

## 2023-06-19 MED ORDER — LITHIUM CARBONATE 300 MG PO CAPS
300.0000 mg | ORAL_CAPSULE | Freq: Two times a day (BID) | ORAL | Status: DC
  Filled 2023-06-19: qty 1

## 2023-06-19 MED ORDER — METOPROLOL SUCCINATE ER 50 MG PO TB24
25.0000 mg | ORAL_TABLET | Freq: Every day | ORAL | Status: DC
  Administered 2023-06-19 – 2023-06-22 (×4): 25 mg via ORAL
  Filled 2023-06-19 (×5): qty 1

## 2023-06-19 MED ORDER — METRONIDAZOLE 500 MG PO TABS
500.0000 mg | ORAL_TABLET | Freq: Two times a day (BID) | ORAL | Status: DC
  Administered 2023-06-19 – 2023-06-22 (×7): 500 mg via ORAL
  Filled 2023-06-19 (×9): qty 1

## 2023-06-19 NOTE — ED Provider Notes (Signed)
[facility] Provider Note    Event Date/Time   First MD Initiated Contact with Patient 06/19/23 1501     (approximate)   History   Chief Complaint Psychiatric Evaluation   HPI  Alyssa Mathis is a 37 y.o. female with past medical history of hypertension, anemia, and schizoaffective disorder who presents to the ED for psychiatric evaluation.  Per IVC paperwork, patient's significant other and father of her child sought IVC due to patient's increasing erratic behavior after she stopped taking her medications.  Significant other reported concern for the safety of their child due to the behavior of the patient.  Patient states that she is here "for an evaluation."  She is unwilling to provide any other details, states that she has been taking her medications.  She denies any suicidal or homicidal ideation, denies any auditory or visual hallucinations.  She admits marijuana use but denies other drug or alcohol use.     Physical Exam   Triage Vital Signs: ED Triage Vitals  Encounter Vitals Group     BP 06/19/23 1412 (!) 154/108     Systolic BP Percentile --      Diastolic BP Percentile --      Pulse Rate 06/19/23 1412 (!) 102     Resp 06/19/23 1412 16     Temp 06/19/23 1412 98.2 F (36.8 C)     Temp Source 06/19/23 1412 Oral     SpO2 06/19/23 1412 96 %     Weight 06/19/23 1411 166 lb (75.3 kg)     Height 06/19/23 1411 5\' 6"  (1.676 m)     Head Circumference --      Peak Flow --      Pain Score --      Pain Loc --      Pain Education --      Exclude from Growth Chart --     Most recent vital signs: Vitals:   06/19/23 1412  BP: (!) 154/108  Pulse: (!) 102  Resp: 16  Temp: 98.2 F (36.8 C)  SpO2: 96%    Constitutional: Alert and oriented. Eyes: Conjunctivae are normal. Head: Atraumatic. Nose: No congestion/rhinnorhea. Mouth/Throat: Mucous membranes are moist.  Cardiovascular: Normal rate, regular rhythm. Grossly normal heart sounds.  2+  radial pulses bilaterally. Respiratory: Normal respiratory effort.  No retractions. Lungs CTAB. Gastrointestinal: Soft and nontender. No distention. Musculoskeletal: No lower extremity tenderness nor edema.  Neurologic:  Normal speech and language. No gross focal neurologic deficits are appreciated.    ED Results / Procedures / Treatments   Labs (all labs ordered are listed, but only abnormal results are displayed) Labs Reviewed  COMPREHENSIVE METABOLIC PANEL - Abnormal; Notable for the following components:      Result Value   Potassium 3.1 (*)    Glucose, Bld 116 (*)    All other components within normal limits  SALICYLATE LEVEL - Abnormal; Notable for the following components:   Salicylate Lvl <7.0 (*)    All other components within normal limits  ACETAMINOPHEN LEVEL - Abnormal; Notable for the following components:   Acetaminophen (Tylenol), Serum <10 (*)    All other components within normal limits  ETHANOL  CBC  URINE DRUG SCREEN, QUALITATIVE ([facility] ONLY)  LITHIUM LEVEL  POC URINE PREG, ED    PROCEDURES:  Critical Care performed: No  Procedures   MEDICATIONS ORDERED IN ED: Medications  potassium chloride SA (KLOR-CON M) CR tablet 40 mEq (has no administration in time range)  metoprolol succinate (TOPROL-XL) 24 hr tablet 25 mg (has no administration in time range)  NIFEdipine (PROCARDIA-XL/NIFEDICAL-XL) 24 hr tablet 90 mg (has no administration in time range)     IMPRESSION / MDM / ASSESSMENT AND PLAN / ED COURSE  I reviewed the triage vital signs and the nursing notes.                              37 y.o. female with past medical history of hypertension, anemia, and schizoaffective disorder who presents to the ED under IVC for psychiatric evaluation due to erratic behavior and medication noncompliance.  Patient's presentation is most consistent with acute presentation with potential threat to life or bodily function.  Differential diagnosis includes, but is  not limited to, psychosis, depression, anxiety, medication noncompliance, substance abuse, electrolyte abnormality.  Patient nontoxic-appearing and in no acute distress, vital signs are remarkable for hypertension but otherwise reassuring.  Patient denies any medical complaints and screening labs are unremarkable with no significant anemia, leukocytosis, tract abnormality, or AKI.  Pregnancy testing is negative and Tylenol and salicylate levels are undetectable.  Patient may be medically cleared for psychiatric disposition, which is pending at this time.  The patient has been placed in psychiatric observation due to the need to provide a safe environment for the patient while obtaining psychiatric consultation and evaluation, as well as ongoing medical and medication management to treat the patient's condition.  The patient has been placed under full IVC at this time.       FINAL CLINICAL IMPRESSION(S) / ED DIAGNOSES   Final diagnoses:  Schizoaffective disorder, unspecified type (HCC)     Rx / DC Orders   ED Discharge Orders     None        Note:  This document was prepared using Dragon voice recognition software and may include unintentional dictation errors.   Chesley Noon, MD 06/19/23 [phone]

## 2023-06-19 NOTE — ED Notes (Signed)
Psych at bedside assessing patient.

## 2023-06-19 NOTE — ED Notes (Signed)
Pt asked questions about medications, but agreeable to take medications. Pt wishes to drink bottled water and the tap water tasted funny. Pt provided with bottle water to encourage medication compliance. Pt took medication without incidence. Pt calm and cooperative, with flat affect. Pt slow to respond to questions, but does answer appropriately.
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
## 2023-06-19 NOTE — ED Notes (Signed)
Pt given dinner tray and beverage  

## 2023-06-19 NOTE — ED Notes (Signed)
Pt. To BHU from ED ambulatory without difficulty, to room  BHU 3. Report from [facility]. Pt. Is alert and oriented, warm and dry in no distress. Pt. Denies SI, HI, and AVH. Pt. Calm and cooperative. Pt. Made aware of security cameras and Q15 minute rounds. Pt. Encouraged to let Nursing staff know of any concerns or needs.    ENVIRONMENTAL ASSESSMENT Potentially harmful objects out of patient reach: Yes.   Personal belongings secured: Yes.   Patient dressed in hospital provided attire only: Yes.   Plastic bags out of patient reach: Yes.   Patient care equipment (cords, cables, call bells, lines, and drains) shortened, removed, or accounted for: Yes.   Equipment and supplies removed from bottom of stretcher: Yes.   Potentially toxic materials out of patient reach: Yes.   Sharps container removed or out of patient reach: Yes.

## 2023-06-19 NOTE — ED Triage Notes (Signed)
Pt arrives with BPD under IVC for the following reason. Per pt child's father, pt left her rehab facility recently and has not been taking her medication. Per petitioner, the pt has been confused on her location and telling him that he put her on the street. Petitioner sts that they are both homeless so for the safety of his child he went to the magistrates office.

## 2023-06-19 NOTE — ED Notes (Signed)
IVC PT CONSULT COMPLETED , PENDING PLACEMENT

## 2023-06-19 NOTE — ED Notes (Signed)
IVC PENDING  CONSULT ?
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
## 2023-06-19 NOTE — ED Notes (Signed)
Pt moving to [facility].

## 2023-06-20 NOTE — ED Notes (Signed)
Patient is talking on the phone at this time, she has been without any behavioral issues , calm and cooperative.

## 2023-06-20 NOTE — ED Notes (Deleted)
Vol/Per Annice Pih, NP, pt is recommended for inpatient psychiatric admission.

## 2023-06-20 NOTE — ED Provider Notes (Signed)
Emergency Medicine Observation Re-evaluation Note  Physical Exam   BP (!) 145/99   Pulse 99   Temp 98.2 F (36.8 C)   Resp 17   Ht 5\' 6"  (1.676 m)   Wt 75.3 kg   LMP  (LMP Unknown)   SpO2 96%   BMI 26.79 kg/m   Patient appears in no acute distress.  ED Course / MDM   No reported events during my shift at the time of this note.   Pt is awaiting dispo from consultants   Pilar Jarvis MD    Pilar Jarvis, MD 06/20/23 [phone]

## 2023-06-20 NOTE — ED Notes (Signed)
Patient ate late breakfast, she is cooperative, but guarded, slow to respond to verbal stimuli, she did take all po medications and applied ointment to rash. Staff will continue to monitor for safety.

## 2023-06-20 NOTE — ED Notes (Signed)
ivc/Per Annice Pih NP, pt is recommended for inpatient psychiatric admission.

## 2023-06-20 NOTE — Consult Note (Signed)
  Patient noted lying in the bed. Patient asleep at this time.

## 2023-06-20 NOTE — ED Notes (Signed)
IVC /per Annice Pih NP, Pt recommended for inpatient psychiatric admission

## 2023-06-21 DIAGNOSIS — F250 Schizoaffective disorder, bipolar type: Secondary | ICD-10-CM | POA: Diagnosis not present

## 2023-06-21 MED ORDER — HALOPERIDOL 5 MG PO TABS
10.0000 mg | ORAL_TABLET | Freq: Two times a day (BID) | ORAL | Status: DC
  Administered 2023-06-22 (×3): 10 mg via ORAL
  Filled 2023-06-21 (×5): qty 2
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
## 2023-06-21 NOTE — BH Assessment (Signed)
Writer spoke with the patient to complete an updated/reassessment. Patient continue to be paranoid.

## 2023-06-21 NOTE — ED Notes (Signed)
IVC/Rec.Inpt. Admit 

## 2023-06-21 NOTE — BH Assessment (Addendum)
Per [facility] AC Tresa Endo S.), patient to be referred out of system.  Referral information for Psychiatric Hospitalization faxed to;   [facility] [phone]- [phone]), no available bed.  [facility] [phone]),   [address] [phone]),  [place] [phone], [phone], [phone] or [phone]),   [place] [phone]--- [phone]--- [phone])  [address] [phone]),   [facility] [phone] -or- [phone]),   [facility] [phone] phone-- [phone])  [facility] [phone]),  [place] [phone])  [facility] [phone]).  [facility] [phone])

## 2023-06-21 NOTE — ED Notes (Signed)
Ivc / recommend psychiatric inpatient admission when medically cleared 

## 2023-06-21 NOTE — ED Provider Notes (Signed)
Emergency Medicine Observation Re-evaluation Note  Consepcion Mathis is a 37 y.o. female, seen on rounds today.  Pt initially presented to the ED for complaints of Psychiatric Evaluation Currently, the patient is resting comfortably.  Physical Exam  BP 113/74   Pulse 72   Temp 98.7 F (37.1 C) (Oral)   Resp 19   Ht 5\' 6"  (1.676 m)   Wt 75.3 kg   LMP  (LMP Unknown)   SpO2 96%   BMI 26.79 kg/m  Physical Exam General: No acute distress  ED Course / MDM  EKG:EKG Interpretation Date/Time:  Wednesday June 19 2023 16:48:17 EDT Ventricular Rate:  67 PR Interval:  132 QRS Duration:  88 QT Interval:  406 QTC Calculation: 429 R Axis:   82  Text Interpretation: Sinus rhythm with Premature supraventricular complexes Otherwise normal ECG When compared with ECG of 15-May-2023 21:25, PREVIOUS ECG IS PRESENT Confirmed by UNCONFIRMED, DOCTOR ([id number]), editor Alyssa Mathis ([id number]) on 06/20/2023 8:20:04 AM  I have reviewed the labs performed to date as well as medications administered while in observation.  Recent changes in the last 24 hours include no acute events.  Plan  Current plan is for inpatient psychiatric admission pending bed placement.    Alyssa Lima, MD 06/21/23 [phone]

## 2023-06-21 NOTE — ED Notes (Signed)
Pt provided a snack, 100% intake

## 2023-06-21 NOTE — ED Notes (Signed)
Breakfast and orange juice provided by staff.

## 2023-06-22 MED ORDER — GUAIFENESIN 100 MG/5ML PO LIQD
5.0000 mL | Freq: Once | ORAL | Status: AC
  Administered 2023-06-23: 5 mL via ORAL
  Filled 2023-06-22: qty 5

## 2023-06-22 NOTE — ED Provider Notes (Signed)
Emergency Medicine Observation Re-evaluation Note  Alyssa Mathis is a 37 y.o. female, seen on rounds today.  Pt initially presented to the ED for complaints of Psychiatric Evaluation  Currently, the patient is no acute distress. Resting comfortable. No issues per bhu nurse   Physical Exam  Blood pressure 106/70, pulse 65, temperature 97.7 F (36.5 C), temperature source Oral, resp. rate 16, height 5\' 6"  (1.676 m), weight 75.3 kg, SpO2 97%, unknown if currently breastfeeding.  Physical Exam General: No apparent distress Pulm: Normal WOB Psych: resting     ED Course / MDM     I have reviewed the labs performed to date as well as medications administered while in observation.  Recent changes in the last 24 hours include none   Plan   Current plan is to continue to wait for psych placement today  Patient is under full IVC at this time.   Concha Se, MD 06/22/23 [phone]

## 2023-06-22 NOTE — ED Notes (Signed)
Patient received snack at this time.

## 2023-06-22 NOTE — ED Notes (Addendum)
Staff pulled and reviewed daily medications as well as PRN for agitation, with patient she then became increasingly agitated and stated that she's only here "because my baby daddy said a bunch of bullshit"  Staff attempted to remind pt that she should be consistent with her daily medication regime.  She turned around and stated "I know my rights and I don't have to take meds if I don't want them"

## 2023-06-22 NOTE — ED Notes (Signed)
PATIENT BED AVAILABLE AT 1:30PM ON 06/22/23   Patient has been accepted to [facility].  Accepting physician is Dr. Sherrian Divers.  Call report to [phone].  Representative was [employer].

## 2023-06-22 NOTE — ED Notes (Signed)
Pt initially refused medications during 2200 med pass.  RN sat and talked to patient for approximately 15 minutes at this time.  Pt stated numerous reasons why she did not want to take medications including, "they are not the same color," "I don't need those medicinces," "I don't know what doctor prescribed them."    Pt approached nurses station after 0000 and requested to take medications.  Pt stated "I just want to get this shit over so I can go to my sons game tomorrow, I'll take the medicine."  RN provided pt with remaining available medications and pt took them w/o issue.

## 2023-06-22 NOTE — ED Notes (Signed)
Phone given. 

## 2023-06-22 NOTE — BH Assessment (Signed)
PATIENT BED AVAILABLE AT 1:30PM ON 06/22/23  Patient has been accepted to [facility].  Accepting physician is Dr. Sherrian Divers.  Call report to [phone].  Representative was [employer].   ER Staff is aware of it:  [facility] ER Secretary  Dr. Elesa Massed, ER MD  Feliz Beam Patient's Nurse

## 2023-06-22 NOTE — ED Notes (Signed)
Pt now requesting medication, calm cooperative compliant with medication.

## 2023-06-23 NOTE — ED Notes (Signed)
Repot given to Sloan Leiter, RN

## 2023-06-23 NOTE — ED Provider Notes (Signed)
Emergency Medicine Observation Re-evaluation Note  Alyssa Mathis is a 37 y.o. female, seen on rounds today.  Pt initially presented to the ED for complaints of Psychiatric Evaluation  Currently, the patient is calm, no acute complaints.  Physical Exam  Blood pressure 112/81, pulse (!) 52, temperature 98.3 F (36.8 C), temperature source Oral, resp. rate 16, height 5\' 6"  (1.676 m), weight 75.3 kg, SpO2 97%, unknown if currently breastfeeding. Physical Exam General: NAD Lungs: CTAB Psych: not agitated  ED Course / MDM  EKG:    I have reviewed the labs performed to date as well as medications administered while in observation.  Recent changes in the last 24 hours include no acute events overnight.  Accepted for transfer to [facility] today  Plan  Current plan is for transfer to inpatient psych. Patient is under full IVC at this time.   Sharman Cheek, MD 06/23/23 [phone]

## 2023-06-23 NOTE — ED Notes (Signed)
EMTALA reviewed by this RN.  

## 2023-12-05 ENCOUNTER — Other Ambulatory Visit: Payer: Self-pay

## 2023-12-05 MED ORDER — HYDROXYZINE PAMOATE 50 MG PO CAPS: 50.0000 mg | ORAL_CAPSULE | Freq: Four times a day (QID) | ORAL | 0 refills | Status: DC | PRN

## 2023-12-05 MED ORDER — UZEDY 100 MG/0.28ML ~~LOC~~ SUSY: 100.0000 mg | PREFILLED_SYRINGE | SUBCUTANEOUS | 1 refills | Status: DC

## 2023-12-05 MED ORDER — OLANZAPINE 20 MG PO TABS: 20.0000 mg | ORAL_TABLET | Freq: Every day | ORAL | 1 refills | Status: DC

## 2023-12-05 MED ORDER — RISPERIDONE ER 25 MG IM SRER: 25.0000 mg | INTRAMUSCULAR | 0 refills | Status: DC

## 2023-12-05 MED ORDER — LITHIUM CARBONATE ER 450 MG PO TBCR: 450.0000 mg | EXTENDED_RELEASE_TABLET | Freq: Every day | ORAL | 1 refills | Status: DC

## 2023-12-05 MED ORDER — LITHIUM CARBONATE ER 450 MG PO TBCR: 450.0000 mg | EXTENDED_RELEASE_TABLET | Freq: Every day | ORAL | 0 refills | Status: DC

## 2023-12-05 MED ORDER — NYSTATIN 100000 UNIT/ML MT SUSP: 5.0000 mL | Freq: Four times a day (QID) | OROMUCOSAL | 0 refills | Status: DC

## 2023-12-05 MED ORDER — RISPERIDONE 2 MG PO TABS: 2.0000 mg | ORAL_TABLET | Freq: Two times a day (BID) | ORAL | 0 refills | Status: DC

## 2023-12-15 ENCOUNTER — Encounter: Payer: Self-pay | Admitting: Psychiatric/Mental Health

## 2023-12-15 ENCOUNTER — Other Ambulatory Visit: Payer: Self-pay

## 2023-12-15 ENCOUNTER — Emergency Department
Admission: EM | Admit: 2023-12-15 | Discharge: 2023-12-15 | Disposition: A | Discharge status: Home / Self Care | Attending: Emergency Medicine | Admitting: Emergency Medicine

## 2023-12-15 DIAGNOSIS — Z139 Encounter for screening, unspecified: Secondary | ICD-10-CM

## 2023-12-15 DIAGNOSIS — F250 Schizoaffective disorder, bipolar type: Secondary | ICD-10-CM | POA: Diagnosis not present

## 2023-12-15 DIAGNOSIS — Z79899 Other long term (current) drug therapy: Secondary | ICD-10-CM | POA: Insufficient documentation

## 2023-12-15 DIAGNOSIS — F411 Generalized anxiety disorder: Secondary | ICD-10-CM | POA: Insufficient documentation

## 2023-12-15 DIAGNOSIS — Z0001 Encounter for general adult medical examination with abnormal findings: Secondary | ICD-10-CM | POA: Diagnosis not present

## 2023-12-15 DIAGNOSIS — F309 Manic episode, unspecified: Secondary | ICD-10-CM | POA: Diagnosis present

## 2023-12-15 LAB — CBC WITH DIFFERENTIAL/PLATELET
Abs Immature Granulocytes: 0.03 10*3/uL (ref 0.00–0.07)
Basophils Absolute: 0.1 10*3/uL (ref 0.0–0.1)
Basophils Relative: 1 %
Eosinophils Absolute: 0 10*3/uL (ref 0.0–0.5)
Eosinophils Relative: 0 %
HCT: 38.4 % (ref 36.0–46.0)
Hemoglobin: 13.3 g/dL (ref 12.0–15.0)
Immature Granulocytes: 0 %
Lymphocytes Relative: 22 %
Lymphs Abs: 2 10*3/uL (ref 0.7–4.0)
MCH: 30.2 pg (ref 26.0–34.0)
MCHC: 34.6 g/dL (ref 30.0–36.0)
MCV: 87.3 fL (ref 80.0–100.0)
Monocytes Absolute: 1 10*3/uL (ref 0.1–1.0)
Monocytes Relative: 10 %
Neutro Abs: 6.2 10*3/uL (ref 1.7–7.7)
Neutrophils Relative %: 67 %
Platelets: 298 10*3/uL (ref 150–400)
RBC: 4.4 MIL/uL (ref 3.87–5.11)
RDW: 12.2 % (ref 11.5–15.5)
WBC: 9.3 10*3/uL (ref 4.0–10.5)
nRBC: 0 % (ref 0.0–0.2)

## 2023-12-15 LAB — URINE DRUG SCREEN, QUALITATIVE (ARMC ONLY)
Amphetamines, Ur Screen: NOT DETECTED
Barbiturates, Ur Screen: NOT DETECTED
Benzodiazepine, Ur Scrn: NOT DETECTED
Cannabinoid 50 Ng, Ur ~~LOC~~: POSITIVE — AB
Cocaine Metabolite,Ur ~~LOC~~: NOT DETECTED
MDMA (Ecstasy)Ur Screen: NOT DETECTED
Methadone Scn, Ur: NOT DETECTED
Opiate, Ur Screen: NOT DETECTED
Phencyclidine (PCP) Ur S: NOT DETECTED
Tricyclic, Ur Screen: NOT DETECTED

## 2023-12-15 LAB — COMPREHENSIVE METABOLIC PANEL WITH GFR
ALT: 17 U/L (ref 0–44)
AST: 30 U/L (ref 15–41)
Albumin: 4.5 g/dL (ref 3.5–5.0)
Alkaline Phosphatase: 69 U/L (ref 38–126)
Anion gap: 12 (ref 5–15)
BUN: 12 mg/dL (ref 6–20)
CO2: 25 mmol/L (ref 22–32)
Calcium: 9.7 mg/dL (ref 8.9–10.3)
Chloride: 103 mmol/L (ref 98–111)
Creatinine, Ser: 0.99 mg/dL (ref 0.44–1.00)
GFR, Estimated: >60 mL/min (ref >60)
Glucose, Bld: 99 mg/dL (ref 70–99)
Potassium: 2.7 mmol/L — CL (ref 3.5–5.1)
Sodium: 140 mmol/L (ref 135–145)
Total Bilirubin: 0.8 mg/dL (ref 0.0–1.2)
Total Protein: 8.2 g/dL — ABNORMAL HIGH (ref 6.5–8.1)

## 2023-12-15 LAB — MAGNESIUM: Magnesium: 1.9 mg/dL (ref 1.7–2.4)

## 2023-12-15 LAB — POC URINE PREG, ED: Preg Test, Ur: POSITIVE — AB

## 2023-12-15 LAB — ETHANOL: Alcohol, Ethyl (B): <10 mg/dL (ref <10)

## 2023-12-15 MED ORDER — POTASSIUM CHLORIDE CRYS ER 20 MEQ PO TBCR
40.0000 meq | EXTENDED_RELEASE_TABLET | Freq: Once | ORAL | Status: AC
  Administered 2023-12-15: 40 meq via ORAL
  Filled 2023-12-15: qty 2

## 2023-12-15 NOTE — ED Notes (Signed)
 Pt given breakfast tray

## 2023-12-15 NOTE — ED Notes (Signed)
 Pt ask for water before answering the pt she went to the sink in triage and began putting water in her hand and drinking it from her hand. Pt also put water on her face.

## 2023-12-15 NOTE — ED Notes (Signed)
 POC was NEGATIVE BUT SCANNED IN THE CHART AS POSITIVE WILL SEND A POC EDIT SHEET

## 2023-12-15 NOTE — BH Assessment (Addendum)
 Comprehensive Clinical Assessment (CCA) Screening, Triage and Referral Note  12/15/2023 Alyssa Mathis [id number] Recommendations for Services/Supports/Treatments: Disposition pending Alyssa Mathis is a 38 year old Black female with a psych hx of schizoaffective d/o bipolar type. Pt presented to [facility] ED under IVC. Per triage note: Patient brought in by [facility] police department for IVC, patient has a history of mental illness and is out her medications, patient has been described as manic  Pt unable to identify any concerns or a concrete reason why she'd presented to the ED. Pt was reticent and expressed that she is extremely tired. Pt reported that she lives with the father of her children and her four children. Pt reports that she is medication compliant and her next appointment is Monday 3/31 at [facility]. Pt denied having depression or anxiety. Pt denied sleep/appetite disturbance. Pt reported having increased irritability; however, pt denied having symptoms of mania. Pt had lacking insight. The patient denied current SI, HI or AV/H. The pt stopped answering assessment questions, explaining that she was too tired to participate further.   Chief Complaint:  Chief Complaint  Patient presents with   Psychiatric Evaluation   Visit Diagnosis: Schizoaffective disorder, bipolar type [facility])   Patient Reported Information How did you hear about [name]? Family/Friend  What Is the Reason for Your Visit/Call Today? Patient reports she was brought to the ED by police for an evaluation.  How Long Has This Been Causing You Problems? > than 6 months  What Do You Feel Would Help You the Most Today? Stress Management; Medication(s)   Have You Recently Had Any Thoughts About Hurting Yourself? Yes  Are You Planning to Commit Suicide/Harm Yourself At This time? No   Have you Recently Had Thoughts About Hurting Someone [name]? No  Are You Planning to Harm Someone at This Time? No  Explanation: Pt denies  SI/HI   Have You Used Any Alcohol or Drugs in the Past 24 Hours? No  How Long Ago Did You Use Drugs or Alcohol? No data recorded What Did You Use and How Much? Pt denied Alcohol/ Substance use.   Do You Currently Have a Therapist/Psychiatrist? No  Name of Therapist/Psychiatrist: N/a   Have You Been Recently Discharged From Any Office Practice or Programs? Yes  Explanation of Discharge From Practice/Program: [facility]- August 2024    CCA Screening Triage Referral Assessment Type of Contact: Face-to-Face  Telemedicine Service Delivery:   Is this Initial or Reassessment?   Date Telepsych consult ordered in [facility]:    Time Telepsych consult ordered in [facility]:    Location of Assessment: [facility] ED  Provider Location: [facility] ED    Collateral Involvement: None   Does Patient Have a Court Appointed Legal Guardian? No data recorded Name and Contact of Legal Guardian: No data recorded If Minor and Not Living with Parent(s), Who has Custody? Adult Patient with no legal guardian  Is CPS involved or ever been involved? Never  Is APS involved or ever been involved? Never   Patient Determined To Be At Risk for Harm To Self or Others Based on Review of Patient Reported Information or Presenting Complaint? No  Method: No Plan  Availability of Means: No access or NA  Intent: Vague intent or NA  Notification Required: No need or identified person  Additional Information for Danger to Others Potential: -- (Pt denied SI/HI)  Additional Comments for Danger to Others Potential: Pt denied SI/HI  Are There Guns or Other Weapons in Your Home? No  Types of Guns/Weapons: Pt  denied  Are These Weapons Safely Secured?                            -- (Pt denied)  Who Could Verify You Are Able To Have These Secured: Pt denied  Do You Have any Outstanding Charges, Pending Court Dates, Parole/Probation? Yes - Upcoming court date on 08/09/23 for expired vehicle tags.  Contacted To Inform of Risk  of Harm To Self or Others: -- (None. Pt denied HI.)   Does Patient Present under Involuntary Commitment? Yes    [place] of Residence: [place]   Patient Currently Receiving the Following Services: Not Receiving Services   Determination of Need: Emergent (2 hours)   Options For Referral: ED Visit; Inpatient Hospitalization; Outpatient Therapy; Medication Management   Disposition Recommendation per psychiatric provider:   Foy [place], LCAS

## 2023-12-15 NOTE — ED Notes (Signed)
 Cart placed in room.

## 2023-12-15 NOTE — ED Notes (Signed)
 Pt provided with breakfast tray.

## 2023-12-15 NOTE — ED Notes (Signed)
 Pt given clothes to get dressed into regular clothes. Pt provided with phone to call for ride.

## 2023-12-15 NOTE — ED Triage Notes (Signed)
 Patient brought in by [facility] police department for IVC,  patient has a history of mental illness and is out her medications, patient has been described as manic

## 2023-12-15 NOTE — ED Notes (Signed)
IVC prior to arrival/Psych Consult Ordered/Pending  

## 2023-12-15 NOTE — ED Notes (Signed)
Patient is IVC pending consult °

## 2023-12-15 NOTE — ED Notes (Signed)
 EDT Emilee provided meal tray to pt
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
## 2023-12-15 NOTE — ED Provider Notes (Signed)
 [facility] Provider Note    Event Date/Time   First MD Initiated Contact with Patient 12/15/23 [phone]     (approximate)   History   Psychiatric Evaluation   HPI  Alyssa Mathis is a 38 y.o. female   Past medical history of hypertension, schizoaffective disorder, who presents to the Emergency Department via police IVC for manic behavior.  Per report she is exhibiting manic behavior, hyperreligiosity, has been off of her psychiatric medications.  When I speak to the patient she is calm cooperative and has no acute medical complaints.  She states that she knows she is here brought by police but is unsure who called the police on her.  She states that she just wants to get some sleep now.  She denies drug or alcohol use aside from some marijuana occasionally.  She denies any SI, HI, audiovisual hallucinations.  She is awake alert oriented to self place situation.   External Medical Documents Reviewed: Behavioral health notes from evaluation in October 2024      Physical Exam   Triage Vital Signs: ED Triage Vitals  Encounter Vitals Group     BP 12/15/23 0321 (!) 187/136     Systolic BP Percentile --      Diastolic BP Percentile --      Pulse Rate 12/15/23 0321 (!) 120     Resp 12/15/23 0321 18     Temp 12/15/23 0321 99.4 F (37.4 C)     Temp Source 12/15/23 0321 Oral     SpO2 12/15/23 0321 99 %     Weight 12/15/23 0323 165 lb 5.5 oz (75 kg)     Height 12/15/23 0323 5\' 6"  (1.676 m)     Head Circumference --      Peak Flow --      Pain Score 12/15/23 0323 0     Pain Loc --      Pain Education --      Exclude from Growth Chart --     Most recent vital signs: Vitals:   12/15/23 0321  BP: (!) 187/136  Pulse: (!) 120  Resp: 18  Temp: 99.4 F (37.4 C)  SpO2: 99%    General: Awake, no distress.  CV:  Good peripheral perfusion.  Resp:  Normal effort.  Abd:  No distention.  Other:  Awake alert comfortable appearing laying in the stretcher.   Soft benign abdominal exam.  Slightly tachycardic in the low 100s.  Hypertensive.  Afebrile.  Lungs are clear.  No signs of trauma to the head or arms bilaterally.   ED Results / Procedures / Treatments   Labs (all labs ordered are listed, but only abnormal results are displayed) Labs Reviewed  COMPREHENSIVE METABOLIC PANEL WITH GFR - Abnormal; Notable for the following components:      Result Value   Potassium 2.7 (*)    Total Protein 8.2 (*)    All other components within normal limits  URINE DRUG SCREEN, QUALITATIVE ([facility] ONLY) - Abnormal; Notable for the following components:   Cannabinoid 50 Ng, Ur [place] POSITIVE (*)    All other components within normal limits  POC URINE PREG, ED - Abnormal; Notable for the following components:   Preg Test, Ur POSITIVE (*)    All other components within normal limits  ETHANOL  CBC WITH DIFFERENTIAL/PLATELET  MAGNESIUM     I ordered and reviewed the above labs they are notable for potassium 2.7.  Pregnancy test is positive but erroneously entered,  see nursing note, is negative.  EKG  ED ECG REPORT I, Pilar Jarvis, the attending physician, personally viewed and interpreted this ECG.   Date: 12/15/2023  EKG Time: 0413  Rate: 101  Rhythm: sinus tachycardia  Axis: nl  Intervals:none  ST&T Change: no stemi    PROCEDURES:  Critical Care performed: No  Procedures   MEDICATIONS ORDERED IN ED: Medications  potassium chloride SA (KLOR-CON M) CR tablet 40 mEq (40 mEq Oral Given 12/15/23 0425)     IMPRESSION / MDM / ASSESSMENT AND PLAN / ED COURSE  I reviewed the triage vital signs and the nursing notes.                                Patient's presentation is most consistent with acute presentation with potential threat to life or bodily function.  Differential diagnosis includes, but is not limited to, acute decompensated psychiatric illness, substance-induced mood disorder   The patient is on the cardiac monitor to evaluate  for evidence of arrhythmia and/or significant heart rate changes.  MDM:    IVC by police prior to arrival to the emergency department for reported manic behavior, hyperreligiosity, off of medications.  However on my examination she seems calm cooperative and exhibits none of these properties.  Labs unremarkable aside for hypokalemia without significant EKG changes, repletion ordered. Clear for psychiatric evaluation under IVC        FINAL CLINICAL IMPRESSION(S) / ED DIAGNOSES   Final diagnoses:  Encounter for medical screening examination     Rx / DC Orders   ED Discharge Orders     None        Note:  This document was prepared using Dragon voice recognition software and may include unintentional dictation errors.    Pilar Jarvis, MD 12/15/23 [phone]

## 2023-12-15 NOTE — ED Notes (Signed)
 This RN attempted ask Pt questions, Pt does not want to speak right now and remains mute. Let Pt know she can call RN if she needs anything. She nodded.

## 2023-12-16 DIAGNOSIS — F411 Generalized anxiety disorder: Secondary | ICD-10-CM

## 2024-07-07 ENCOUNTER — Ambulatory Visit

## 2024-07-07 VITALS — BP 183/119 | Ht 65.0 in | Wt 174.0 lb

## 2024-07-07 DIAGNOSIS — R030 Elevated blood-pressure reading, without diagnosis of hypertension: Secondary | ICD-10-CM

## 2024-07-07 DIAGNOSIS — Z7251 High risk heterosexual behavior: Secondary | ICD-10-CM

## 2024-07-07 DIAGNOSIS — Z113 Encounter for screening for infections with a predominantly sexual mode of transmission: Secondary | ICD-10-CM

## 2024-07-07 LAB — WET PREP FOR TRICH, YEAST, CLUE
Clue Cell Exam: NEGATIVE
Trichomonas Exam: NEGATIVE
Yeast Exam: NEGATIVE

## 2024-07-07 LAB — PREGNANCY, URINE: Preg Test, Ur: NEGATIVE

## 2024-07-07 NOTE — Progress Notes (Signed)
 [facility] Department STI clinic [address], [address] [place] [address] Main phone: [phone]  STI screening visit  Subjective:  Alyssa Mathis is a 38 y.o. female being seen today for an STI screening visit. The patient reports they do not have symptoms.  Patient reports that they do not desire a pregnancy in the next year. Patient is currently using no method - no contraceptive precautions to prevent pregnancy. They reported they are not interested in discussing contraception today.    No LMP recorded (lmp unknown).  Patient has the following medical conditions:  Patient Active Problem List   Diagnosis Date Noted   Anxiety state 12/16/2023   Schizoaffective disorder, bipolar type (HCC) 06/28/2020   Chief Complaint  Patient presents with   Acute Visit    STI screening and pregnancy test-blood   HPI Patient reports desire for STI testing and a pregnancy test. Her LMP was around 06/09/24 and she has not yet missed a period, but is worried about pregnancy because she had unprotected sex on 06/26/24. She specifically would like a blood pregnancy test. Discussed that it is really too early to test for pregnancy as she has yet to miss her period. Patient insistent on a pregnancy test today - UPT ordered. Desires STI testing - no symptoms or contacts.  See flowsheet for further details and programmatic requirements Hyperlink available at the top of the signed note in blue.  Flow sheet content below:  Pregnancy Intention Screening Does the patient want to become pregnant in the next year?: No Does the patient's partner want to become pregnant in the next year?: No Would the patient like to discuss contraceptive options today?: No   Screening for MPX risk:  Unexplained rash?  No   MSM?  No   Multiple or anonymous sex partners?  No   Any close or sexual contact with a person  diagnosed with MPX?  No   Any outside the [place]  where MPX is endemic?  No    High clinical suspicion for MPX?    -Unlikely to be chickenpox    -Lymphadenopathy    -Rash that presents in same phase of       evolution on any given body part  No   Screenings: Last HIV test per patient/review of record was No results found for: HMHIVSCREEN  Lab Results  Component Value Date   HIV Non Reactive 06/14/2023     Last HEPC test per patient/review of record was No results found for: HMHEPCSCREEN No components found for: HEPC   Last HEPB test per patient/review of record was No components found for: HMHEPBSCREEN   Patient reports last pap was:   No results found for: SPECADGYN No Cervical Cancer Screening results to display.  Immunization history:  There is no immunization history for the selected administration types on file for this patient.  The following portions of the patient's history were reviewed and updated as appropriate: allergies, current medications, past medical history, past social history, past surgical history and problem list.  Objective:   Vitals:   07/07/24 1122  BP: (!) 183/119  Weight: 174 lb (78.9 kg)  Height: 5' 5 (1.651 m)   Physical Exam Constitutional:      Appearance: Normal appearance.  HENT:     Head: Normocephalic.     Mouth/Throat:     Mouth: Mucous membranes are moist.  Eyes:     General: No scleral icterus.       Right eye: No  discharge.        Left eye: No discharge.  Pulmonary:     Effort: Pulmonary effort is normal.  Skin:    General: Skin is warm and dry.  Neurological:     General: No focal deficit present.     Mental Status: She is alert.  Psychiatric:        Mood and Affect: Mood normal.        Behavior: Behavior normal.    Assessment and Plan:  Alyssa Mathis is a 38 y.o. female presenting to the [facility] Department for STI screening  1. Screening for venereal disease (Primary)  - WET PREP FOR TRICH, YEAST, CLUE - HIV [facility] - Syphilis Serology, [place] [facility] -  Chlamydia/Gonorrhea [place] [facility]  2. Unprotected sex  - Last sex on 10/10 unprotected, LMP 9/23, period yet to be missed - Pregnancy, urine as strongly desired by patient negative, encouraged to re-test if she misses her period  3. Elevated blood pressure reading  - BP 183/119, repeat 176/110 - Per pt, has had elevated BP in pregnancy but not outside of pregnancy - Denies blurred vision, headaches, chest pain, or shortness of breath. Feeling well and well-appearing. - Has PCP - strongly advised to contact them today and seek evaluation ASAP  Patient accepted the following screenings: vaginal CT/GC swab, vaginal wet prep, HIV, and RPR Treat wet prep per standing order Discussed time line for State Lab results and that patient will be called with positive results and encouraged patient to call if she had not heard in 2 weeks.  Counseled to return or seek care for continued or worsening symptoms Recommended repeat testing in 3 months with positive results. Recommended condom use with all sex for STI prevention.   Return for STI testing as needed.  No future appointments.  Damien FORBES Satchel, NP

## 2024-07-07 NOTE — Progress Notes (Signed)
 Pt here for acute visit for urine pregnancy test and STI screening.  Urine pregnancy test today is Negative and results reviewed with patient.  Wet mount reviewed and no treatment needed as per standing orders.  Pt to repeat pregnancy test at home or schedule PT at clinic if no menses in next 7 days.  Verbalizes understanding of above.  Voices no further questions. Non-latex condoms provided to patient.-Meghen Akopyan, Charity fundraiser

## 2024-07-15 LAB — HIV ~~LOC~~ LAB: HIV Ag/Ab Combo: NONREACTIVE

## 2024-07-28 ENCOUNTER — Emergency Department: Admission: EM | Admit: 2024-07-28 | Discharge: 2024-07-28 | Discharge status: Against Medical Advice

## 2024-07-28 ENCOUNTER — Ambulatory Visit
Admission: EM | Admit: 2024-07-28 | Discharge: 2024-07-28 | Disposition: A | Discharge status: Home / Self Care | Attending: Emergency Medicine | Admitting: Emergency Medicine

## 2024-07-28 DIAGNOSIS — Z3202 Encounter for pregnancy test, result negative: Secondary | ICD-10-CM

## 2024-07-28 DIAGNOSIS — Z91148 Patient's other noncompliance with medication regimen for other reason: Secondary | ICD-10-CM

## 2024-07-28 DIAGNOSIS — N926 Irregular menstruation, unspecified: Secondary | ICD-10-CM | POA: Diagnosis not present

## 2024-07-28 DIAGNOSIS — Z8659 Personal history of other mental and behavioral disorders: Secondary | ICD-10-CM

## 2024-07-28 LAB — POCT URINE PREGNANCY: Preg Test, Ur: NEGATIVE

## 2024-07-28 NOTE — Discharge Instructions (Addendum)
 Your urine pregnancy is negative.  We do not have urine drug screening available here.  Follow-up with your primary care provider or go to the emergency department for your concern for drugs in your urine.
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
## 2024-08-06 ENCOUNTER — Other Ambulatory Visit: Payer: Self-pay

## 2024-08-06 ENCOUNTER — Emergency Department
Admission: EM | Admit: 2024-08-06 | Discharge: 2024-08-08 | Disposition: A | Discharge status: Other Acute Inpatient Hospital | Attending: Emergency Medicine | Admitting: Emergency Medicine

## 2024-08-06 DIAGNOSIS — Z008 Encounter for other general examination: Secondary | ICD-10-CM

## 2024-08-06 DIAGNOSIS — Z79899 Other long term (current) drug therapy: Secondary | ICD-10-CM | POA: Diagnosis not present

## 2024-08-06 DIAGNOSIS — F23 Brief psychotic disorder: Secondary | ICD-10-CM

## 2024-08-06 DIAGNOSIS — Z8659 Personal history of other mental and behavioral disorders: Secondary | ICD-10-CM | POA: Diagnosis not present

## 2024-08-06 DIAGNOSIS — F1210 Cannabis abuse, uncomplicated: Secondary | ICD-10-CM

## 2024-08-06 DIAGNOSIS — I10 Essential (primary) hypertension: Secondary | ICD-10-CM | POA: Insufficient documentation

## 2024-08-06 DIAGNOSIS — F061 Catatonic disorder due to known physiological condition: Secondary | ICD-10-CM

## 2024-08-06 DIAGNOSIS — F259 Schizoaffective disorder, unspecified: Secondary | ICD-10-CM

## 2024-08-06 DIAGNOSIS — F1410 Cocaine abuse, uncomplicated: Secondary | ICD-10-CM

## 2024-08-06 DIAGNOSIS — F250 Schizoaffective disorder, bipolar type: Secondary | ICD-10-CM | POA: Diagnosis present

## 2024-08-06 LAB — CBC
HCT: 43.3 % (ref 36.0–46.0)
Hemoglobin: 14.4 g/dL (ref 12.0–15.0)
MCH: 29.8 pg (ref 26.0–34.0)
MCHC: 33.3 g/dL (ref 30.0–36.0)
MCV: 89.6 fL (ref 80.0–100.0)
Platelets: 253 K/uL (ref 150–400)
RBC: 4.83 MIL/uL (ref 3.87–5.11)
RDW: 12.3 % (ref 11.5–15.5)
WBC: 5.6 K/uL (ref 4.0–10.5)
nRBC: 0 % (ref 0.0–0.2)

## 2024-08-06 LAB — COMPREHENSIVE METABOLIC PANEL WITH GFR
ALT: 29 U/L (ref 0–44)
AST: 21 U/L (ref 15–41)
Albumin: 4.9 g/dL (ref 3.5–5.0)
Alkaline Phosphatase: 96 U/L (ref 38–126)
Anion gap: 19 — ABNORMAL HIGH (ref 5–15)
BUN: 30 mg/dL — ABNORMAL HIGH (ref 6–20)
CO2: 24 mmol/L (ref 22–32)
Calcium: 10.2 mg/dL (ref 8.9–10.3)
Chloride: 103 mmol/L (ref 98–111)
Creatinine, Ser: 1.18 mg/dL — ABNORMAL HIGH (ref 0.44–1.00)
GFR, Estimated: >60 mL/min (ref >60)
Glucose, Bld: 96 mg/dL (ref 70–99)
Potassium: 4.1 mmol/L (ref 3.5–5.1)
Sodium: 146 mmol/L — ABNORMAL HIGH (ref 135–145)
Total Bilirubin: 0.9 mg/dL (ref 0.0–1.2)
Total Protein: 8.6 g/dL — ABNORMAL HIGH (ref 6.5–8.1)

## 2024-08-06 LAB — ETHANOL: Alcohol, Ethyl (B): <15 mg/dL (ref <15)

## 2024-08-06 MED ORDER — OLANZAPINE 10 MG PO TABS
10.0000 mg | ORAL_TABLET | Freq: Every day | ORAL | Status: DC
  Administered 2024-08-07: 10 mg via ORAL
  Filled 2024-08-06 (×2): qty 1

## 2024-08-06 MED ORDER — IBUPROFEN 600 MG PO TABS: 600.0000 mg | ORAL_TABLET | Freq: Three times a day (TID) | ORAL | Status: DC | PRN

## 2024-08-06 MED ORDER — ONDANSETRON HCL 4 MG PO TABS: 4.0000 mg | ORAL_TABLET | Freq: Three times a day (TID) | ORAL | Status: DC | PRN

## 2024-08-06 MED ORDER — ALUM & MAG HYDROXIDE-SIMETH 200-200-20 MG/5ML PO SUSP: 30.0000 mL | Freq: Four times a day (QID) | ORAL | Status: DC | PRN

## 2024-08-06 NOTE — ED Notes (Signed)
IVC 

## 2024-08-06 NOTE — BH Assessment (Signed)
 This Clinical research associate contacted [facility] via phone to request an assessment for this patient, request has been made, assessment is currently pending

## 2024-08-06 NOTE — ED Notes (Signed)
 Pt dressed out in hospital scrubs. Pt belongings include:  Red t-shirt Red and black pants Black shoes Papers Black sweat pants Car key Black sweat shirt

## 2024-08-06 NOTE — BH Assessment (Signed)
 This writer attempted to assess patient but patient would not answer any questions, she appeared to be awake and is easily aroused but refused to participate in assessment. Attempt was made to contact collateral from the patient's mother Alois Silvan [phone] but no answer and unable to leave a voicemail

## 2024-08-06 NOTE — ED Triage Notes (Signed)
 Pt to ED in police custody. Pt was in jail and bonded out by mother. IVC paperwork taken out by member of ACT team. Per paperwork, pt is delusional and not compliant with psychiatric services. Hx of schizoaffective disorder, marijuana and cocaine use. 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

## 2024-08-06 NOTE — ED Provider Notes (Signed)
 [facility] Provider Note    Event Date/Time   First MD Initiated Contact with Patient 08/06/24 1900     (approximate)   History   Chief Complaint: Psychiatric Evaluation   HPI  Alyssa Mathis is a 38 y.o. female with schizoaffective disorder who is brought to the ED under involuntary commitment due to worsening delusions in the setting of recently being incarcerated and off her medications.     Physical Exam   Triage Vital Signs: ED Triage Vitals  Encounter Vitals Group     BP 08/06/24 1837 (!) 153/102     Girls Systolic BP Percentile --      Girls Diastolic BP Percentile --      Boys Systolic BP Percentile --      Boys Diastolic BP Percentile --      Pulse Rate 08/06/24 1837 86     Resp 08/06/24 1837 18     Temp 08/06/24 1837 98.3 F (36.8 C)     Temp Source 08/06/24 1837 Oral     SpO2 08/06/24 1837 98 %     Weight --      Height --      Head Circumference --      Peak Flow --      Pain Score 08/06/24 1838 0     Pain Loc --      Pain Education --      Exclude from Growth Chart --     Most recent vital signs: Vitals:   08/06/24 1837  BP: (!) 153/102  Pulse: 86  Resp: 18  Temp: 98.3 F (36.8 C)  SpO2: 98%    General: Awake, no distress. CV:  Good peripheral perfusion.  Resp:  Normal effort.  Abd:  No distention.  Other:  No wounds.  Delusional, disorganized.   ED Results / Procedures / Treatments   Labs (all labs ordered are listed, but only abnormal results are displayed) Labs Reviewed  COMPREHENSIVE METABOLIC PANEL WITH GFR - Abnormal; Notable for the following components:      Result Value   Sodium 146 (*)    BUN 30 (*)    Creatinine, Ser 1.18 (*)    Total Protein 8.6 (*)    Anion gap 19 (*)    All other components within normal limits  ETHANOL  CBC  URINE DRUG SCREEN  URINALYSIS, W/ REFLEX TO CULTURE (INFECTION SUSPECTED)  POC URINE PREG, ED      EKG    RADIOLOGY    PROCEDURES:  Procedures   MEDICATIONS ORDERED IN ED: Medications  ibuprofen  (ADVIL ) tablet 600 mg (has no administration in time range)  ondansetron  (ZOFRAN ) tablet 4 mg (has no administration in time range)  alum & mag hydroxide-simeth (MAALOX/MYLANTA) 200-200-20 MG/5ML suspension 30 mL (has no administration in time range)     IMPRESSION / MDM / ASSESSMENT AND PLAN / ED COURSE  I reviewed the triage vital signs and the nursing notes.  Patient's presentation is most consistent with severe exacerbation of chronic illness.  Patient presents with symptoms of acute psychosis.  Request psychiatry evaluation.  The patient has been placed in psychiatric observation due to the need to provide a safe environment for the patient while obtaining psychiatric consultation and evaluation, as well as ongoing medical and medication management to treat the patient's condition.  The patient has been placed under full IVC at this time.      FINAL CLINICAL IMPRESSION(S) / ED DIAGNOSES   Final diagnoses:  Schizoaffective disorder, unspecified type (HCC)     Rx / DC Orders   ED Discharge Orders     None        Note:  This document was prepared using Dragon voice recognition software and may include unintentional dictation errors.   Viviann Pastor, MD 08/06/24 2049

## 2024-08-07 ENCOUNTER — Encounter: Payer: Self-pay | Admitting: Psychiatry

## 2024-08-07 DIAGNOSIS — Z8659 Personal history of other mental and behavioral disorders: Secondary | ICD-10-CM

## 2024-08-07 DIAGNOSIS — F23 Brief psychotic disorder: Secondary | ICD-10-CM

## 2024-08-07 DIAGNOSIS — F250 Schizoaffective disorder, bipolar type: Secondary | ICD-10-CM | POA: Diagnosis not present

## 2024-08-07 DIAGNOSIS — F061 Catatonic disorder due to known physiological condition: Secondary | ICD-10-CM

## 2024-08-07 DIAGNOSIS — F1410 Cocaine abuse, uncomplicated: Secondary | ICD-10-CM

## 2024-08-07 DIAGNOSIS — F1210 Cannabis abuse, uncomplicated: Secondary | ICD-10-CM

## 2024-08-07 LAB — URINALYSIS, W/ REFLEX TO CULTURE (INFECTION SUSPECTED)
Bacteria, UA: NONE SEEN
Glucose, UA: NEGATIVE mg/dL
Ketones, ur: 20 mg/dL — AB
Leukocytes,Ua: NEGATIVE
Nitrite: NEGATIVE
Protein, ur: 100 mg/dL — AB
Specific Gravity, Urine: 1.034 — ABNORMAL HIGH (ref 1.005–1.030)
pH: 5 (ref 5.0–8.0)

## 2024-08-07 LAB — URINE DRUG SCREEN
Amphetamines: NEGATIVE
Barbiturates: NEGATIVE
Benzodiazepines: NEGATIVE
Cocaine: NEGATIVE
Fentanyl: NEGATIVE
Methadone Scn, Ur: NEGATIVE
Opiates: NEGATIVE
Tetrahydrocannabinol: POSITIVE — AB

## 2024-08-07 MED ORDER — LORAZEPAM 1 MG PO TABS: 1.0000 mg | ORAL_TABLET | Freq: Four times a day (QID) | ORAL | Status: DC | PRN

## 2024-08-07 MED ORDER — AMLODIPINE BESYLATE 5 MG PO TABS
5.0000 mg | ORAL_TABLET | Freq: Once | ORAL | Status: AC
  Administered 2024-08-07: 5 mg via ORAL
  Filled 2024-08-07: qty 1

## 2024-08-07 MED ORDER — DIPHENHYDRAMINE HCL 25 MG PO CAPS: 50.0000 mg | ORAL_CAPSULE | Freq: Four times a day (QID) | ORAL | Status: DC | PRN

## 2024-08-07 MED ORDER — LORAZEPAM 2 MG/ML IJ SOLN
2.0000 mg | Freq: Once | INTRAMUSCULAR | Status: DC
Start: 2024-08-07 — End: 2024-08-07

## 2024-08-07 MED ORDER — HALOPERIDOL LACTATE 5 MG/ML IJ SOLN: 5.0000 mg | Freq: Four times a day (QID) | INTRAMUSCULAR | Status: DC | PRN

## 2024-08-07 MED ORDER — HALOPERIDOL 5 MG PO TABS: 5.0000 mg | ORAL_TABLET | Freq: Four times a day (QID) | ORAL | Status: DC | PRN

## 2024-08-07 NOTE — ED Notes (Signed)
 Pt provided with lunch tray and beverage

## 2024-08-07 NOTE — Progress Notes (Signed)
 Patient has been accepted to [facility] for 08/08/24. Patient was assigned to [facility]. Accepting physician is Dr. Millie Manners. Call report to [phone] Option 2 (Please leave voicemail). Representative was [employer]   ER Staff is aware of it: Luann, ER Secretary Dr. Jossie, ER MD Delon PEAK, Patient's Nurse   Address:  [address]                  [place], [place] [address]

## 2024-08-07 NOTE — Progress Notes (Signed)
 Patient under review with [facility]. Carrie @ [facility] is awaiting feedback from their medical team due to pt's elevated BP.  [place], [facility] [phone]

## 2024-08-07 NOTE — ED Notes (Signed)
 IVC Pt will be Transported to [facility] on 08/08/2024

## 2024-08-07 NOTE — BH Assessment (Signed)
 Comprehensive Clinical Assessment (CCA) Note  08/07/2024 Alyssa Mathis [id number]  Chief Complaint: Patient is a 38 year old female presenting to [facility] ED under IVC. Per triage note Pt to ED in police custody. Pt was in jail and bonded out by mother. IVC paperwork taken out by member of ACT team. Per paperwork, pt is delusional and not compliant with psychiatric services. Hx of schizoaffective disorder, marijuana and cocaine use. 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 During assessment patient appears somewhat alert but not oriented, when this clinical research associate and Psyc NP called the patient's name she at first was unable to open her eyes but then was able to open them only shortly. Patient's eyes can be observed closed but can be seen reacting to this clinical research associate and Psyc NP's voice to try to gain her attention, although patient does not move at first. After a few short minutes patient was able to move over to her side and move her hands slowly. This writer attempted to give the patient a tissue for her eyes as they were watering but the patient was unable to grab the tissue. Patient is unable to answer any questions and is unable to respond. Unknown at this time if she is experiencing SI/HI/AH/VH. Chief Complaint  Patient presents with   Psychiatric Evaluation   Visit Diagnosis: Schizoaffective disorder, bipolar type     CCA Screening, Triage and Referral (STR)  Patient Reported Information How did you hear about us ? Legal System  Referral name: No data recorded Referral phone number: No data recorded  Whom do you see for routine medical problems? No data recorded Practice/Facility Name: No data recorded Practice/Facility Phone Number: No data recorded Name of Contact: No data recorded Contact Number: No data recorded Contact Fax Number: No data recorded Prescriber Name: No data recorded Prescriber Address (if  known): No data recorded  What Is the Reason for Your Visit/Call Today? Pt to ED in police custody. Pt was in jail and bonded out by mother. IVC paperwork taken out by member of ACT team. Per paperwork, pt is delusional and not compliant with psychiatric services. Hx of schizoaffective disorder, marijuana and cocaine use. 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  How Long Has This Been Causing You Problems? > than 6 months  What Do You Feel Would Help You the Most Today? Medication(s); Stress Management   Have You Recently Been in Any Inpatient Treatment (Hospital/Detox/Crisis Center/28-Day Program)? No data recorded Name/Location of Program/Hospital:No data recorded How Long Were You There? No data recorded When Were You Discharged? No data recorded  Have You Ever Received Services From [facility] Before? No data recorded Who Do You See at [facility]? No data recorded  Have You Recently Had Any Thoughts About Hurting Yourself? -- (UTA)  Are You Planning to Commit Suicide/Harm Yourself At This time? -- (UTA)   Have you Recently Had Thoughts About Hurting Someone Else? -- (UTA)  Explanation: Pt denies SI/HI   Have You Used Any Alcohol or Drugs in the Past 24 Hours? -- (UTA)  How Long Ago Did You Use Drugs or Alcohol? No data recorded What Did You Use and How Much? No data recorded  Do You Currently Have a Therapist/Psychiatrist? -- (UTA)  Name of Therapist/Psychiatrist: No data recorded  Have You Been Recently Discharged  From Any Public Relations Account Executive or Programs? -- (UTA)  Explanation of Discharge From Practice/Program: No data recorded    CCA Screening Triage Referral Assessment Type of Contact: Face-to-Face  Is this Initial or Reassessment? No data recorded Date Telepsych consult ordered in [facility]:  No data recorded Time Telepsych consult ordered in [facility]:  No data recorded  Patient  Reported Information Reviewed? No data recorded Patient Left Without Being Seen? No data recorded Reason for Not Completing Assessment: No data recorded  Collateral Involvement: None   Does Patient Have a Court Appointed Legal Guardian? No data recorded Name and Contact of Legal Guardian: No data recorded If Minor and Not Living with Parent(s), Who has Custody? n/a  Is CPS involved or ever been involved? Never  Is APS involved or ever been involved? Never   Patient Determined To Be At Risk for Harm To Self or Others Based on Review of Patient Reported Information or Presenting Complaint? -- (UTA)  Method: No Plan  Availability of Means: No access or NA  Intent: Vague intent or NA  Notification Required: No need or identified person  Additional Information for Danger to Others Potential: -- (n/a)  Additional Comments for Danger to Others Potential: Pt denied SI/HI  Are There Guns or Other Weapons in Your Home? -- (UTA)  Types of Guns/Weapons: Pt denied  Are These Weapons Safely Secured?                            -- (UTA)  Who Could Verify You Are Able To Have These Secured: Pt denied  Do You Have any Outstanding Charges, Pending Court Dates, Parole/Probation? n/a  Contacted To Inform of Risk of Harm To Self or Others: Other: Comment   Location of Assessment: [facility] ED   Does Patient Present under Involuntary Commitment? Yes  IVC Papers Initial File Date: No data recorded  [place] of Residence: [place]   Patient Currently Receiving the Following Services: Medication Management   Determination of Need: Emergent (2 hours)   Options For Referral: ED Visit     CCA Biopsychosocial Intake/Chief Complaint:  No data recorded Current Symptoms/Problems: No data recorded  Patient Reported Schizophrenia/Schizoaffective Diagnosis in Past: Yes   Strengths: Patient unable to communicate her strengths at this time  Preferences: No data recorded Abilities: No  data recorded  Type of Services Patient Feels are Needed: No data recorded  Initial Clinical Notes/Concerns: No data recorded  Mental Health Symptoms Depression:  Difficulty Concentrating; Sleep (too much or little)   Duration of Depressive symptoms: Greater than two weeks   Mania:  None   Anxiety:   -- (UTA)   Psychosis:  -- (UTA)   Duration of Psychotic symptoms: No data recorded  Trauma:  -- (UTA)   Obsessions:  -- (UTA)   Compulsions:  -- (UTA)   Inattention:  -- (UTA)   Hyperactivity/Impulsivity:  -- (UTA)   Oppositional/Defiant Behaviors:  -- (UTA)   Emotional Irregularity:  -- (UTA)   Other Mood/Personality Symptoms:  N/a    Mental Status Exam Appearance and self-care  Stature:  Average   Weight:  Average weight   Clothing:  Disheveled   Grooming:  Neglected   Cosmetic use:  None   Posture/gait:  Slumped   Motor activity:  Slowed   Sensorium  Attention:  Confused; Unaware   Concentration:  -- (UTA)   Orientation:  -- (Not oriented)   Recall/memory:  Defective in Recent; Defective in Remote;  Defective in Immediate; Defective in Short-term   Affect and Mood  Affect:  Flat   Mood:  Depressed   Relating  Eye contact:  Avoided   Facial expression:  Sad   Attitude toward examiner:  Resistant   Thought and Language  Speech flow: Mute   Thought content:  -- (UTA)   Preoccupation:  -- (UTA)   Hallucinations:  -- (UTA)   Organization:  No data recorded  [employer] of Knowledge:  -- INDUSTRIAL/PRODUCT DESIGNER)   Intelligence:  -- (UTA)   Abstraction:  -- (UTA)   Judgement:  Impaired   Reality Testing:  Unaware   Insight:  Unaware   Decision Making:  Paralyzed   Social Functioning  Social Maturity:  -- INDUSTRIAL/PRODUCT DESIGNER)   Social Judgement:  -- INDUSTRIAL/PRODUCT DESIGNER)   Stress  Stressors:  -- (UTA)   Coping Ability:  -- INDUSTRIAL/PRODUCT DESIGNER)   Skill Deficits:  -- INDUSTRIAL/PRODUCT DESIGNER)   Supports:  -- INDUSTRIAL/PRODUCT DESIGNER)     Religion: Religion/Spirituality Are You A Religious Person?:   (UTA) How Might This Affect Treatment?: N/a  Leisure/Recreation: Leisure / Recreation Do You Have Hobbies?:  (UTA)  Exercise/Diet: Exercise/Diet Do You Exercise?:  (UTA) Have You Gained or Lost A Significant Amount of Weight in the Past Six Months?:  (UTA) Do You Follow a Special Diet?:  (UTA) Do You Have Any Trouble Sleeping?:  (UTA)   CCA Employment/Education Employment/Work Situation: Employment / Work Situation Employment Situation: Unemployed Has Patient ever Been in Equities Trader?:  INDUSTRIAL/PRODUCT DESIGNER)  Education: Education Is Patient Currently Attending School?:  (UTA) Did You Attend College?:  (UTA) Did You Have An Individualized Education Program (IIEP):  (UTA) Did You Have Any Difficulty At School?:  (UTA) Patient's Education Has Been Impacted by Current Illness:  (UTA)   CCA Family/Childhood History Family and Relationship History: Family history Marital status: Single Does patient have children?: Yes  Childhood History:  Childhood History By whom was/is the patient raised?: Mother Did patient suffer any verbal/emotional/physical/sexual abuse as a child?:  (UTA) Did patient suffer from severe childhood neglect?:  (UTA) Has patient ever been sexually abused/assaulted/raped as an adolescent or adult?:  (UTA) Was the patient ever a victim of a crime or a disaster?:  (UTA) Witnessed domestic violence?:  (UTA) Has patient been affected by domestic violence as an adult?:  INDUSTRIAL/PRODUCT DESIGNER)  Child/Adolescent Assessment:     CCA Substance Use Alcohol/Drug Use: Alcohol / Drug Use Pain Medications: SEE MAR Prescriptions: SEE MAR Over the Counter: SEE MAR History of alcohol / drug use?: Yes Longest period of sobriety (when/how long): Patient has a history of substance abuse                         ASAM's:  Six Dimensions of Multidimensional Assessment  Dimension 1:  Acute Intoxication and/or Withdrawal Potential:      Dimension 2:  Biomedical Conditions and  Complications:      Dimension 3:  Emotional, Behavioral, or Cognitive Conditions and Complications:     Dimension 4:  Readiness to Change:     Dimension 5:  Relapse, Continued use, or Continued Problem Potential:     Dimension 6:  Recovery/Living Environment:     ASAM Severity Score:    ASAM Recommended Level of Treatment:     Substance use Disorder (SUD)    Recommendations for Services/Supports/Treatments:    DSM5 Diagnoses: Patient Active Problem List   Diagnosis Date Noted   Anxiety state 12/16/2023   Schizoaffective  disorder, bipolar type (HCC) 06/28/2020    Patient Centered Plan: Patient is on the following Treatment Plan(s):  Depression   Referrals to Alternative Service(s): Referred to Alternative Service(s):   Place:   Date:   Time:    Referred to Alternative Service(s):   Place:   Date:   Time:    Referred to Alternative Service(s):   Place:   Date:   Time:    Referred to Alternative Service(s):   Place:   Date:   Time:      @BHCOLLABOFCARE @  [employer], LCAS-A

## 2024-08-07 NOTE — ED Notes (Signed)
 Urine preg was neg

## 2024-08-07 NOTE — ED Notes (Signed)
 Pt refused vital signs.
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
## 2024-08-07 NOTE — ED Notes (Signed)

## 2024-08-07 NOTE — ED Notes (Signed)
 Pt is showering. Pt provided UA sample to ED tech.

## 2024-08-07 NOTE — Progress Notes (Addendum)
 [facility] made several attempts to contact patient's mother Georga Silvan [phone]) regarding patient's [facility] placement for 08/08/24.  All calls were unsuccessful with no option to leave voicemail.  [place], [facility] [phone]

## 2024-08-07 NOTE — ED Notes (Signed)
 Pt out in hall with no pants on, only shirt and underwear. Pt redirected to room.

## 2024-08-07 NOTE — ED Notes (Signed)
 Pt continues to sleep. Per night RN pt has been mostly sleeping.

## 2024-08-07 NOTE — ED Notes (Signed)
 Patient sleeping

## 2024-08-07 NOTE — ED Notes (Signed)
 Pt was provided supper tray and beverage.

## 2024-08-07 NOTE — ED Notes (Signed)
 Vitals signs were gotten at 9:07 on 08/07/24 but were put in at the wrong time for 0017 notified nurse [place].

## 2024-08-07 NOTE — ED Notes (Signed)
 Meal tray arrived. Pt continues to sleep. Respirations unlabored.

## 2024-08-07 NOTE — Progress Notes (Signed)
 Per [facility]),  there are no appropriate beds available within [facility] system.  Patient was referred to the following facilities:  Service Provider Phone  [facility]  [phone]  [facility]  [phone]  [facility]  [phone]  [facility]  [phone]  [facility]  [phone]  [facility]  [phone]  [facility]  [phone]  [facility]  [phone]  [facility]  [phone]  [facility]  [phone]  [place] [facility]  [phone]  [facility]  [phone]  [facility]  [phone]  [facility]  [phone]  [facility]  [phone]  [facility]  [phone]  [facility]  [phone], [place] [phone]

## 2024-08-07 NOTE — ED Provider Notes (Addendum)
 Emergency Medicine Observation Re-evaluation Note  Alyssa Mathis is a 38 y.o. female, seen on rounds today.  Pt initially presented to the ED for complaints of Psychiatric Evaluation  Currently, the patient is is no acute distress. Denies any concerns at this time.  Physical Exam  Blood pressure (!) 153/102, pulse 86, temperature 98.3 F (36.8 C), temperature source Oral, resp. rate 18, SpO2 98%, unknown if currently breastfeeding.  Physical Exam: General: No apparent distress Pulm: Normal WOB Neuro: Moving all extremities Psych: Resting comfortably     ED Course / MDM     I have reviewed the labs performed to date as well as medications administered while in observation.  Recent changes in the last 24 hours include: No acute events overnight.  Plan   Current plan: Patient awaiting psychiatric disposition.  The patient has been placed in psychiatric observation due to the need to provide a safe environment for the patient while obtaining psychiatric consultation and evaluation, as well as ongoing medical and medication management to treat the patient's condition.       Alyssa Mathis, Alyssa Mathis SAILOR, DO 08/07/24 0408    Alyssa Mathis, Alyssa Mathis SAILOR, DO 09/11/24 [phone]

## 2024-08-07 NOTE — ED Notes (Signed)
 Went to check on pt. Pt now awake. Pt resting comfortably.

## 2024-08-08 LAB — URINE CULTURE: Culture: <10000 — AB

## 2024-08-08 NOTE — ED Notes (Signed)
 Status remains the same. Awaiting transport to [facility]

## 2024-08-08 NOTE — ED Notes (Signed)
 Pt provided breakfast tray.

## 2024-08-08 NOTE — ED Notes (Signed)
 Pt is relaxed in bed, does not answer questions but is not uncooperative with physical assessment/vitals. Pt not wearing scrub pants, clean pants offered to pt but pt non responsive. Pt covered with blanket and allowed to rest at this time.

## 2024-08-08 NOTE — ED Notes (Signed)
 Belongings given to sheriff at time of transportation, Consulting Civil Engineer notified that pt to be transferred at this time.

## 2024-09-09 ENCOUNTER — Other Ambulatory Visit: Payer: Self-pay

## 2024-09-09 ENCOUNTER — Emergency Department
Admission: EM | Admit: 2024-09-09 | Discharge: 2024-09-14 | Disposition: A | Discharge status: Home / Self Care | Attending: Emergency Medicine | Admitting: Emergency Medicine

## 2024-09-09 ENCOUNTER — Encounter: Payer: Self-pay | Admitting: Emergency Medicine

## 2024-09-09 DIAGNOSIS — F250 Schizoaffective disorder, bipolar type: Secondary | ICD-10-CM | POA: Diagnosis present

## 2024-09-09 DIAGNOSIS — R259 Unspecified abnormal involuntary movements: Secondary | ICD-10-CM | POA: Diagnosis not present

## 2024-09-09 DIAGNOSIS — T43596A Underdosing of other antipsychotics and neuroleptics, initial encounter: Secondary | ICD-10-CM | POA: Diagnosis not present

## 2024-09-09 DIAGNOSIS — Z91128 Patient's intentional underdosing of medication regimen for other reason: Secondary | ICD-10-CM | POA: Insufficient documentation

## 2024-09-09 DIAGNOSIS — I10 Essential (primary) hypertension: Secondary | ICD-10-CM | POA: Insufficient documentation

## 2024-09-09 DIAGNOSIS — R456 Violent behavior: Secondary | ICD-10-CM | POA: Diagnosis not present

## 2024-09-09 DIAGNOSIS — Z653 Problems related to other legal circumstances: Secondary | ICD-10-CM | POA: Insufficient documentation

## 2024-09-09 DIAGNOSIS — R4689 Other symptoms and signs involving appearance and behavior: Secondary | ICD-10-CM

## 2024-09-09 DIAGNOSIS — Z046 Encounter for general psychiatric examination, requested by authority: Secondary | ICD-10-CM

## 2024-09-09 DIAGNOSIS — F23 Brief psychotic disorder: Secondary | ICD-10-CM | POA: Insufficient documentation

## 2024-09-09 DIAGNOSIS — I159 Secondary hypertension, unspecified: Secondary | ICD-10-CM

## 2024-09-09 DIAGNOSIS — Z3202 Encounter for pregnancy test, result negative: Secondary | ICD-10-CM | POA: Diagnosis present

## 2024-09-09 DIAGNOSIS — Z79899 Other long term (current) drug therapy: Secondary | ICD-10-CM | POA: Insufficient documentation

## 2024-09-09 DIAGNOSIS — F29 Unspecified psychosis not due to a substance or known physiological condition: Secondary | ICD-10-CM

## 2024-09-09 LAB — COMPREHENSIVE METABOLIC PANEL WITH GFR
ALT: 30 U/L (ref 0–44)
AST: 31 U/L (ref 15–41)
Albumin: 4.4 g/dL (ref 3.5–5.0)
Alkaline Phosphatase: 76 U/L (ref 38–126)
Anion gap: 12 (ref 5–15)
BUN: 14 mg/dL (ref 6–20)
CO2: 25 mmol/L (ref 22–32)
Calcium: 9.3 mg/dL (ref 8.9–10.3)
Chloride: 109 mmol/L (ref 98–111)
Creatinine, Ser: 1.04 mg/dL — ABNORMAL HIGH (ref 0.44–1.00)
GFR, Estimated: >60 mL/min
Glucose, Bld: 92 mg/dL (ref 70–99)
Potassium: 3.5 mmol/L (ref 3.5–5.1)
Sodium: 146 mmol/L — ABNORMAL HIGH (ref 135–145)
Total Bilirubin: 0.5 mg/dL (ref 0.0–1.2)
Total Protein: 7 g/dL (ref 6.5–8.1)

## 2024-09-09 LAB — ETHANOL: Alcohol, Ethyl (B): <15 mg/dL

## 2024-09-09 LAB — CBC
HCT: 33.8 % — ABNORMAL LOW (ref 36.0–46.0)
Hemoglobin: 11.2 g/dL — ABNORMAL LOW (ref 12.0–15.0)
MCH: 30 pg (ref 26.0–34.0)
MCHC: 33.1 g/dL (ref 30.0–36.0)
MCV: 90.6 fL (ref 80.0–100.0)
Platelets: 247 K/uL (ref 150–400)
RBC: 3.73 MIL/uL — ABNORMAL LOW (ref 3.87–5.11)
RDW: 13 % (ref 11.5–15.5)
WBC: 5 K/uL (ref 4.0–10.5)
nRBC: 0 % (ref 0.0–0.2)

## 2024-09-09 LAB — POC URINE PREG, ED: Preg Test, Ur: NEGATIVE

## 2024-09-09 MED ORDER — RISPERIDONE 1 MG PO TABS
3.0000 mg | ORAL_TABLET | Freq: Every day | ORAL | Status: DC
  Administered 2024-09-09: 3 mg via ORAL
  Filled 2024-09-09: qty 3

## 2024-09-09 MED ORDER — OLANZAPINE 10 MG PO TABS
10.0000 mg | ORAL_TABLET | Freq: Every day | ORAL | Status: DC
  Administered 2024-09-09 – 2024-09-10 (×2): 10 mg via ORAL
  Filled 2024-09-09 (×2): qty 1

## 2024-09-09 MED ORDER — ZIPRASIDONE MESYLATE 20 MG IM SOLR
20.0000 mg | Freq: Once | INTRAMUSCULAR | Status: AC
  Administered 2024-09-10: 20 mg via INTRAMUSCULAR
  Filled 2024-09-09: qty 20

## 2024-09-09 NOTE — ED Provider Notes (Signed)
 "  [facility] Provider Note    Event Date/Time   First MD Initiated Contact with Patient 09/09/24 2303     (approximate)   History   Psychiatric Evaluation   HPI  Agata Veloso is a 38 y.o. female with history of schizoaffective disorder who presents to the emergency department under IVC.  Per IVC paperwork taken out by ACT team lead respondent has been previously diagnosed with schizoaffective disorder, bipolar type and is not currently taking oral medications as prescribed.  Respondent has recently had several encounters with the police department that have ended in her arrest.  09/02/2024 arrested for disorderly conduct and possession of marijuana.  While at [facility] detention center the respondent's behavior has been unpredictable including but not limited to spitting on others, not wearing necessary clothing, playing in the toilet, throwing urine, and becoming verbally aggressive.  The respondent is delusional and having hallucinations.  She believes that she is a judge and plans to help Corlis Bohr figure out how the law works.  Patient is unable to provide much history.  She is smiling, laughing and clapping inappropriately.  She does tell me that she is having left breast pain and thinks that she could be pregnant.  Patient reports she has vaginal spotting that has resolved.  No discharge.  No abdominal pain.  History provided by patient, law enforcement.    Past Medical History:  Diagnosis Date   Anemia    Hypertension    Post partum depression    Pregnancy induced hypertension    Schizoaffective disorder (HCC)     Past Surgical History:  Procedure Laterality Date   NO PAST SURGERIES      MEDICATIONS:  Prior to Admission medications  Medication Sig Start Date End Date Taking? Authorizing Provider  OLANZapine  (ZYPREXA ) 10 MG tablet Take 10 mg by mouth at bedtime.    [provider]  risperiDONE  (RISPERDAL ) 3 MG tablet Take  3 mg by mouth at bedtime. Patient not taking: Reported on 07/28/2024 07/17/24   [provider]    Physical Exam   Triage Vital Signs: ED Triage Vitals  Encounter Vitals Group     BP 09/09/24 2247 (!) 156/109     Girls Systolic BP Percentile --      Girls Diastolic BP Percentile --      Boys Systolic BP Percentile --      Boys Diastolic BP Percentile --      Pulse Rate 09/09/24 2247 68     Resp 09/09/24 2247 20     Temp 09/09/24 2247 98.8 F (37.1 C)     Temp Source 09/09/24 2247 Oral     SpO2 09/09/24 2247 99 %     Weight --      Height --      Head Circumference --      Peak Flow --      Pain Score 09/09/24 2244 0     Pain Loc --      Pain Education --      Exclude from Growth Chart --     Most recent vital signs: Vitals:   09/09/24 2247  BP: (!) 156/109  Pulse: 68  Resp: 20  Temp: 98.8 F (37.1 C)  SpO2: 99%    CONSTITUTIONAL: Alert, responds appropriately to questions. Well-appearing; well-nourished HEAD: Normocephalic, atraumatic EYES: Conjunctivae clear, pupils appear equal, sclera nonicteric ENT: normal nose; moist mucous membranes NECK: Supple, normal ROM CARD: RRR; S1 and S2 appreciated BREAST:  Left breast appears normal without redness, warmth, tenderness, induration, fluctuance, mass.  Nipple is normal.  No discharge.  Chaperone present Control And Instrumentation Engineer). RESP: Normal chest excursion without splinting or tachypnea; breath sounds clear and equal bilaterally; no wheezes, no rhonchi, no rales, no hypoxia or respiratory distress, speaking full sentences ABD/GI: Non-distended; soft, non-tender, no rebound, no guarding, no peritoneal signs BACK: The back appears normal EXT: Normal ROM in all joints; no deformity noted, no edema SKIN: Normal color for age and race; warm; no rash on exposed skin NEURO: Moves all extremities equally, normal speech PSYCH: Smiling, laughing, clapping inappropriately.  Does appear to be responding to internal stimuli.   ED  Results / Procedures / Treatments   LABS: (all labs ordered are listed, but only abnormal results are displayed) Labs Reviewed  COMPREHENSIVE METABOLIC PANEL WITH GFR - Abnormal; Notable for the following components:      Result Value   Sodium 146 (*)    Creatinine, Ser 1.04 (*)    All other components within normal limits  CBC - Abnormal; Notable for the following components:   RBC 3.73 (*)    Hemoglobin 11.2 (*)    HCT 33.8 (*)    All other components within normal limits  ETHANOL  URINE DRUG SCREEN  POC URINE PREG, ED     EKG:  EKG Interpretation Date/Time:    Ventricular Rate:    PR Interval:    QRS Duration:    QT Interval:    QTC Calculation:   R Axis:      Text Interpretation:           RADIOLOGY: My personal review and interpretation of imaging:    I have personally reviewed all radiology reports.   No results found.   PROCEDURES:  Critical Care performed: Yes, see critical care procedure note(s)   CRITICAL CARE Performed by: [name] Sink   Total critical care time: 30 minutes  Critical care time was exclusive of separately billable procedures and treating other patients.  Critical care was necessary to treat or prevent imminent or life-threatening deterioration.  Critical care was time spent personally by me on the following activities: development of treatment plan with patient and/or surrogate as well as nursing, discussions with consultants, evaluation of patient's response to treatment, examination of patient, obtaining history from patient or surrogate, ordering and performing treatments and interventions, ordering and review of laboratory studies, ordering and review of radiographic studies, pulse oximetry and re-evaluation of patient's condition.   Procedures    IMPRESSION / MDM / ASSESSMENT AND PLAN / ED COURSE  I reviewed the triage vital signs and the nursing notes.    Patient here under IVC for concerns for acute psychosis.   Patient responding to internal stimuli here with odd affect.     DIFFERENTIAL DIAGNOSIS (includes but not limited to):   Decompensated schizoaffective disorder, psychosis, medication noncompliance, intoxication, malingering   Patient's presentation is most consistent with acute presentation with potential threat to life or bodily function.   PLAN: Will obtain screening labs, urine.  Normal breast exam.  Will restart her home medications.  Will consult psychiatry and TTS.   The patient has been placed in psychiatric observation due to the need to provide a safe environment for the patient while obtaining psychiatric consultation and evaluation, as well as ongoing medical and medication management to treat the patient's condition.  The patient has been placed under full IVC at this time.    MEDICATIONS GIVEN IN ED: Medications  OLANZapine  (ZYPREXA ) tablet 10 mg (10 mg Oral Given 09/09/24 2340)  risperiDONE  (RISPERDAL ) tablet 3 mg (3 mg Oral Given 09/09/24 2340)  ziprasidone  (GEODON ) injection 20 mg (20 mg Intramuscular Given 09/10/24 0010)     ED COURSE:  11:32 PM  Pt's behavior is escalating.  She is yelling, cursing, stating that she is going to leave.  She is refusing her oral Risperdal  and Zyprexa .  Given concerns for patient and staff safety and that we are unable to verbally redirect her and she is actively psychotic, will give IM Geodon .   1:11 AM Pt now resting calmly.  Took IM Geodon  willingly.  No need to restrain patient.  Mild anemia on lab work.  Normal electrolytes and glucose.  Pregnancy test negative.  Negative ethanol, urine drug screen.  Patient medically cleared at this time for psychiatric disposition.  CONSULTS: Psychiatry and TTS consulted.   OUTSIDE RECORDS REVIEWED: Reviewed last psychiatric notes.       FINAL CLINICAL IMPRESSION(S) / ED DIAGNOSES   Final diagnoses:  Psychosis, unspecified psychosis type (HCC)  Involuntary commitment  Aggressive  behavior     Rx / DC Orders   ED Discharge Orders     None        Note:  This document was prepared using Dragon voice recognition software and may include unintentional dictation errors.   Kierston Plasencia, [name] SAILOR, DO 09/10/24 0111  "

## 2024-09-09 NOTE — ED Notes (Signed)
 Pt changed into safety scrubs in presence of this RN, izell, NT and [place], NT no evidence of trauma noted; belongings collected and include:  Toothpaste, Deodorant, Paperwork, Phone, Extra clothes that came from jail in Secretary/administrator Socks Shoes Pants

## 2024-09-09 NOTE — ED Triage Notes (Signed)
 Pt arrives w/ [facility] w/ IVC paperwork in place that states, while at the [employer] detention center the respondent's behavior has been unpredictable including, but not limited spitting on others, no wearing necessary clothing, playing in the toilet, throwing urine, and becoming verbally aggressive. The respondent is delusional and is having hallucinations. She believes that she is a judge and she plans to help jude edwards figure out how the law works

## 2024-09-10 DIAGNOSIS — F250 Schizoaffective disorder, bipolar type: Secondary | ICD-10-CM | POA: Diagnosis not present

## 2024-09-10 DIAGNOSIS — R4689 Other symptoms and signs involving appearance and behavior: Secondary | ICD-10-CM

## 2024-09-10 DIAGNOSIS — Z046 Encounter for general psychiatric examination, requested by authority: Secondary | ICD-10-CM

## 2024-09-10 LAB — URINE DRUG SCREEN
Amphetamines: NEGATIVE
Barbiturates: NEGATIVE
Benzodiazepines: NEGATIVE
Cocaine: NEGATIVE
Fentanyl: NEGATIVE
Methadone Scn, Ur: NEGATIVE
Opiates: NEGATIVE
Tetrahydrocannabinol: NEGATIVE

## 2024-09-10 MED ORDER — LORAZEPAM 2 MG/ML IJ SOLN
INTRAMUSCULAR | Status: AC
  Administered 2024-09-10: 2 mg via INTRAMUSCULAR
  Filled 2024-09-10: qty 1

## 2024-09-10 MED ORDER — DIPHENHYDRAMINE HCL 50 MG/ML IJ SOLN: 50.0000 mg | Freq: Once | INTRAMUSCULAR | Status: AC

## 2024-09-10 MED ORDER — HALOPERIDOL LACTATE 5 MG/ML IJ SOLN: 5.0000 mg | Freq: Once | INTRAMUSCULAR | Status: AC

## 2024-09-10 MED ORDER — HALOPERIDOL LACTATE 5 MG/ML IJ SOLN
INTRAMUSCULAR | Status: AC
  Administered 2024-09-10: 5 mg via INTRAMUSCULAR
  Filled 2024-09-10: qty 1

## 2024-09-10 MED ORDER — ZIPRASIDONE MESYLATE 20 MG IM SOLR
20.0000 mg | Freq: Once | INTRAMUSCULAR | Status: AC
  Administered 2024-09-10: 20 mg via INTRAMUSCULAR
  Filled 2024-09-10: qty 20

## 2024-09-10 MED ORDER — LORAZEPAM 2 MG/ML IJ SOLN: 2.0000 mg | Freq: Once | INTRAMUSCULAR | Status: AC

## 2024-09-10 MED ORDER — DIPHENHYDRAMINE HCL 50 MG/ML IJ SOLN
INTRAMUSCULAR | Status: AC
  Administered 2024-09-10: 50 mg via INTRAMUSCULAR
  Filled 2024-09-10: qty 1

## 2024-09-10 NOTE — Consult Note (Signed)
 Patient received IM medications last evening. This provider attempted to assess patient at this time. Assessment was deferred as patient was resting and unable to participate meaningfully due to post-medication sedation. Will reassess when patient is alert and able to engage in psychiatric evaluation.

## 2024-09-10 NOTE — ED Provider Notes (Signed)
 38 year old female who presents today with concern of manic episode and difficulty caring for herself, the patient continued to be manic, continues to await psychiatric placement, no further acute events during my shift.   Fernand Rossie HERO, MD 09/10/24 2008

## 2024-09-10 NOTE — ED Notes (Signed)
 Pt provided breakfast tray.

## 2024-09-10 NOTE — Consult Note (Signed)
 [facility] Psychiatric Consult Initial  Patient Name: .Alyssa Mathis  MRN: [id number]  DOB: [birth year]  Consult Order details:  Orders (From admission, onward)     Start     Ordered   09/09/24 2311  CONSULT TO CALL ACT TEAM       Ordering Provider: Neomi [name] SAILOR, DO  Provider:  (Not yet assigned)  Question:  Reason for Consult?  Answer:  Psych consult   09/09/24 2311   09/09/24 2311  IP CONSULT TO PSYCHIATRY       Ordering Provider: Neomi [name] SAILOR, DO  Provider:  (Not yet assigned)  Question:  Reason for consult:  Answer:  Medication management   09/09/24 2311             Mode of Visit: In person    Psychiatry Consult Evaluation  Service Date: September 10, 2024 LOS:  LOS: 0 days  Chief Complaint What's going on  Primary Psychiatric Diagnoses   Schizoaffective disorder, bipolar type (HCC)   Assessment   Alyssa Mathis is a 38 y.o. female admitted: Presented to the EDfor 09/09/2024 11:00 PM for respondent has been previously diagnosed with schizoaffective disorder, bipolar type and is not currently taking oral medications as prescribed. Respondent has recently had several encounters with the police department that have ended in her arrest. 09/02/2024 arrested for disorderly conduct and possession of marijuana. While at [facility] detention center the respondent's behavior has been unpredictable including but not limited to spitting on others, not wearing necessary clothing, playing in the toilet, throwing urine, and becoming verbally aggressive. The respondent is delusional and having hallucinations. She believes that she is a judge and plans to help Alyssa Mathis figure out how the law works. . She carries the psychiatric diagnoses of schizoaffective disorder, bipolar type and has a past medical history of  unknown.   Due to patient's current presentation, including disorganized behavior, incoherent speech, delusional thought content, aggression, and inability to  respond to redirection, patient is at risk to self and others. Involuntary commitment should be upheld. Patient is recommended for inpatient psychiatric admission.    Diagnoses:  Active Hospital problems: Active Problems:   Schizoaffective disorder, bipolar type (HCC)    Plan   ## Psychiatric Medication Recommendations:  -Medical team has initiated patient home dose of Zyprexa  10mg  nightly  ## Medical Decision Making Capacity: Not specifically addressed in this encounter  ## Further Work-up:   -- Pending EKG, due to patient's behaviors may not be able to obtain this-most recent EKG completed on 12/15/2023 showed QTc of 474 -- Pertinent labwork reviewed earlier this admission includes: CBC, ethanol, CMP, urine drug screen   ## Disposition:--Patient is recommended for inpatient psychiatric admission  ## Behavioral / Environmental: - No specific recommendations at this time.     ## Safety and Observation Level:  - Based on my clinical evaluation, I estimate the patient to be at low risk of self harm in the current setting. - At this time, we recommend  routine. This decision is based on my review of the chart including patient's history and current presentation, interview of the patient, mental status examination, and consideration of suicide risk including evaluating suicidal ideation, plan, intent, suicidal or self-harm behaviors, risk factors, and protective factors. This judgment is based on our ability to directly address suicide risk, implement suicide prevention strategies, and develop a safety plan while the patient is in the clinical setting. Please contact our team if there is a concern that risk level has  changed.  CSSR Risk Category:C-SSRS RISK CATEGORY: No Risk  Suicide Risk Assessment: Patient has following modifiable risk factors for suicide: medication noncompliance, lack of access to outpatient mental health resources, and active mental illness (to encompass adhd, tbi,  mania, psychosis, trauma reaction), which we are addressing by recommending inpatient psychiatric admission for further monitoring and stabilization. Patient has following non-modifiable or demographic risk factors for suicide: psychiatric hospitalization Patient has the following protective factors against suicide: UTA  Thank you for this consult request. Recommendations have been communicated to the primary team.  We will sign off at this time.   Zelda Sharps, NP        History of Present Illness  Relevant Aspects of Hospital ED   Patient Report:  Patient was brought in under involuntary commitment from detention center due to concerning behavioral presentation. Per IVC papers, patient has been acting unpredictably with behaviors including spitting on others, not wearing necessary clothing, playing in toilet, throwing urine, and becoming verbally aggressive. IVC documentation reports patient has expressed the delusional belief that she is a judge and plans to help Alyssa Mathis figure out how the law works.  Last evening, patient required intramuscular medications due to behavioral concerns. Per nursing report and review of nursing notes today, patient also required intramuscular agitation medications as she was noted to be acting bizarrely and erratically. Per documentation review, patient came out of her room speaking incoherently, banging hands on walls, with pressured speech and elevated tone of voice. Patient did not respond to any verbal redirection attempts.  This provider and psychiatry team were at bedside to assess patient. Patient was noted to be acting bizarrely. As soon as this provider entered the room, patient stated what is going on I took the words right out of your mouth. On mental status examination, patient presented with disheveled appearance, pressured speech, and loud speech. Patient then began speaking in incoherent speech.  Psych ROS:  Depression: UTA Anxiety:   UTA Mania (lifetime and current): ACT team on IVC papers reported history of schizoaffective disorder Psychosis: (lifetime and current): ACT team on IVC papers reported history of schizoaffective disorder      Psychiatric and Social History  Psychiatric History:  Information collected from limited history obtained during this exam due to patient behavior  Prev Dx/Sx: Schizoaffective disorder, bipolar type Current Psych Provider: UTA Home Meds (current): Zyprexa  listed in patient's prior to arrival medications Previous Med Trials: Risperidone  Therapy: UTA  Prior Psych Hospitalization: UTA  Prior Self Harm: UTA Prior Violence: UTA  Family Psych History: UTA Family Hx suicide: UTA  Social History:   Educational Hx: UTA Occupational Hx: UTA Legal Hx: Was brought to the emergency department from detention center Living Situation: UTA Spiritual Hx: UTA Access to weapons/lethal means: UTA   Substance History Alcohol: UTA  Type of alcohol UTA Last Drink UTA Number of drinks per day UTA History of alcohol withdrawal seizures UTA History of DT's UTA Tobacco: UTA Illicit drugs: UTA Prescription drug abuse: UTA Rehab hx: UTA  Exam Findings  Physical Exam: Reviewed and agree with the physical exam findings conducted by the medical provider Vital Signs:  Temp:  [98 F (36.7 C)-98.8 F (37.1 C)] 98 F (36.7 C) (12/25 1231) Pulse Rate:  [68-74] 74 (12/25 1231) Resp:  [19-20] 19 (12/25 1231) BP: (133-156)/(96-109) 133/96 (12/25 1231) SpO2:  [97 %-99 %] 97 % (12/25 1231) Blood pressure (!) 133/96, pulse 74, temperature 98 F (36.7 C), temperature source Oral, resp. rate 19, SpO2 97%,  unknown if currently breastfeeding. There is no height or weight on file to calculate BMI.    Mental Status Exam: General Appearance: Disheveled  Orientation:  Other:  UTA  Memory:  UTA  Concentration:  Concentration: Poor  Recall:  Poor  Attention  Poor  Eye Contact:  Darting  Speech:   Pressured  Language:  Poor  Volume:  Increased  Mood: UTA  Affect:  Labile  Thought Process:  Disorganized and Irrelevant  Thought Content:  Illogical  Suicidal Thoughts:  UTA  Homicidal Thoughts:  UTA  Judgement:  Poor  Insight:  poor  Psychomotor Activity:  Normal  Akathisia:  UTA  Fund of Knowledge:  UTA      Assets:  Others:  UTA  Cognition:  Impaired due to current presentation  ADL's:  Intact  AIMS (if indicated):        Other History   These have been pulled in through the EMR, reviewed, and updated if appropriate.  Family History:  The patient's family history includes Diabetes in her maternal grandfather; Hypertension in her maternal grandfather, maternal grandmother, and sister.  Medical History: Past Medical History:  Diagnosis Date   Anemia    Hypertension    Post partum depression    Pregnancy induced hypertension    Schizoaffective disorder (HCC)     Surgical History: Past Surgical History:  Procedure Laterality Date   NO PAST SURGERIES       Medications:  Current Medications[1]  Allergies: Allergies[2]  Zelda Sharps, NP This note was created using Dragon dictation software. Please excuse any inadvertent transcription errors. Case was discussed with supervising physician Dr. Jadapalle who is agreeable with current plan.        [1]  Current Facility-Administered Medications:    diphenhydrAMINE  (BENADRYL ) injection 50 mg, 50 mg, Intramuscular, Once, Bradler, Evan K, MD   haloperidol  lactate (HALDOL ) injection 5 mg, 5 mg, Intramuscular, Once, Bradler, Evan K, MD   LORazepam  (ATIVAN ) injection 2 mg, 2 mg, Intramuscular, Once, Bradler, Evan K, MD   OLANZapine  (ZYPREXA ) tablet 10 mg, 10 mg, Oral, QHS, Ward, Kristen N, DO, 10 mg at 09/09/24 2340  Current Outpatient Medications:    OLANZapine  (ZYPREXA ) 5 MG tablet, Take 5 mg by mouth at bedtime., Disp: , Rfl:    OLANZapine  (ZYPREXA ) 10 MG tablet, Take 10 mg by mouth at bedtime.,  Disp: , Rfl:    risperiDONE  (RISPERDAL ) 3 MG tablet, Take 3 mg by mouth at bedtime. 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

## 2024-09-10 NOTE — ED Notes (Signed)
 Dinner tray provided to pt

## 2024-09-10 NOTE — ED Notes (Signed)
 Pt continues to have pressured speech, screaming, singing and dancing erratically.

## 2024-09-10 NOTE — BH Assessment (Signed)
 This writer attempted to assess patient but is currently unable to participate due to medications administered. Psyc team to assess when patient is able to participate.

## 2024-09-10 NOTE — ED Notes (Signed)
 Lunch tray provided to pt.

## 2024-09-10 NOTE — BH Assessment (Signed)
 Comprehensive Clinical Assessment (CCA) Screening, Triage and Referral Note  09/10/2024 Alyssa Mathis [id number]  Chief Complaint:  Chief Complaint  Patient presents with   Psychiatric Evaluation   Visit Diagnosis: Schizoaffective Disorder  Alyssa Mathis is a 38 year old female who presents to the ER, due to psychotic and manic behaviors.  Per the IVC, Respondent has been previously diagnosed with schizoaffective disorder, bipolar type and is not currently taking her oral medications as prescribed. Respondent has recently had several encounters with the police department that have ended in her arrest. 09/02/2024 arrested for disorderly conduct and possession for marijuana. While at the [facility] the respondent's behavior has been unpredictable including, but not limited to spitting on others, not wearing necessary clothing, playing in the toilet, throwing urine and becoming verbally aggressive. The respondent is delusional and is having hallucinations. She believes that she is a judge and she plans to help judge Celestia figure out how the law works.  During the interview, the told Psych team she took their words and starting making non-sensical sounds. Prior to Psych team walking into the patient's room, she was responding to internal stimuli. Due to her current presentation, the patient was unable to fully participate in the interview by providing appropriate answers to the questions.   Patient Reported Information How did you hear about us ? Legal System  What Is the Reason for Your Visit/Call Today? presents to the ER, due to psychotic and manic behaviors.  How Long Has This Been Causing You Problems? 1-6 months  What Do You Feel Would Help You the Most Today? Treatment for Depression or other mood problem   Have You Recently Had Any Thoughts About Hurting Yourself? No  Are You Planning to Commit Suicide/Harm Yourself At This time? No   Have you Recently  Had Thoughts About Hurting Someone Sherral? No  Are You Planning to Harm Someone at This Time? No  Explanation: Pt denies SI/HI   Have You Used Any Alcohol or Drugs in the Past 24 Hours? No  How Long Ago Did You Use Drugs or Alcohol? No data recorded What Did You Use and How Much? No data recorded  Do You Currently Have a Therapist/Psychiatrist? -- (Unknown)  Name of Therapist/Psychiatrist: No data recorded  Have You Been Recently Discharged From Any Office Practice or Programs? -- (UTA)  Explanation of Discharge From Practice/Program: No data recorded   CCA Screening Triage Referral Assessment Type of Contact: Face-to-Face  Telemedicine Service Delivery:   Is this Initial or Reassessment?   Date Telepsych consult ordered in [facility]:    Time Telepsych consult ordered in [facility]:    Location of Assessment: [facility] ED  Provider Location: [facility] ED    Collateral Involvement: Information provided by law enforcement   Does Patient Have a Court Appointed Legal Guardian? No data recorded Name and Contact of Legal Guardian: No data recorded If Minor and Not Living with Parent(s), Who has Custody? n/a  Is CPS involved or ever been involved? Never  Is APS involved or ever been involved? Never   Patient Determined To Be At Risk for Harm To Self or Others Based on Review of Patient Reported Information or Presenting Complaint? No  Method: No Plan  Availability of Means: No access or NA  Intent: Vague intent or NA  Notification Required: No need or identified person  Additional Information for Danger to Others Potential: -- (n/a)  Additional Comments for Danger to Others Potential: Pt denied SI/HI  Are There Guns or Other  Weapons in Your Home? -- (UTA)  Types of Guns/Weapons: Pt denied  Are These Weapons Safely Secured?                            -- (UTA)  Who Could Verify You Are Able To Have These Secured: Pt denied  Do You Have any Outstanding Charges, Pending Court Dates,  Parole/Probation? n/a  Contacted To Inform of Risk of Harm To Self or Others: Other: Comment   Does Patient Present under Involuntary Commitment? Yes   [place] of Residence: Alyssa Mathis   Patient Currently Receiving the Following Services: -- (Unknown)   Determination of Need: Emergent (2 hours)   Options For Referral: ED Visit; Inpatient Hospitalization   Disposition Recommendation per psychiatric provider: Inpatient Treatment  Kiki DOROTHA Barge MS, LCAS, [facility], [facility] Therapeutic Triage Specialist 09/10/2024 3:23 PM

## 2024-09-10 NOTE — ED Notes (Signed)
 IVC/pending psych consult

## 2024-09-10 NOTE — ED Provider Notes (Incomplete)
----------------------------------------- °  11:39 PM on 09/10/2024 -----------------------------------------  Patient's vitals are stable but her behavior is escalating, increasingly manic, unpredictable, not responding to redirection, yelling and screaming and starting to be worrisome for dangerous and unpredictable behavior that could present a danger to herself and staff members.  I see that she has responded well to Geodon  as well as she has had doses of haloperidol  and Benadryl .  I ordered Geodon  20 mg IM.  ED ECG REPORT I, Darleene Dome, the attending physician, personally viewed and interpreted this ECG.  Date: 09/11/2024 EKG Time: 6:37 AM Rate: 97 Rhythm: normal sinus rhythm QRS Axis: normal Intervals: QTc intervals slightly prolonged at 40 ms ST/T Wave abnormalities: normal Narrative Interpretation: no evidence of acute ischemia    Dome Darleene, MD 09/11/24 [phone]

## 2024-09-10 NOTE — ED Notes (Signed)
 Pt out of room, speaking in tongues incoherently. Making motions with hands that appears to be asking for phone to use. Banging hands on walls and on stretcher. Encouraged to stay in room. No evidence of understanding.

## 2024-09-10 NOTE — BH Assessment (Signed)
 Per [facility] patient to be referred out of system.  Referral information for Psychiatric Hospitalization faxed to;   [facility] [phone]- [phone]), no appropriate bed  Service Provider Phone  [facility]  [phone]  [facility]  [phone]  [facility]  [phone]  [facility]  [phone]  [facility]  [phone]  [facility]  [phone]  [facility]  [phone]  [facility]  [phone]  [facility]  [phone]  [facility]  [phone]  [facility]  [phone]  [place] [facility]  [phone]  [facility]  [phone]  [facility]  [phone]  [facility]  [phone]

## 2024-09-10 NOTE — BH Assessment (Signed)
 TTS is unable to complete consult at this time. Patient is asleep and unable to participate in the interview. Asked patient's nurse to contact the psych team when she wakes up.

## 2024-09-11 MED ORDER — DIVALPROEX SODIUM ER 250 MG PO TB24
500.0000 mg | ORAL_TABLET | Freq: Every day | ORAL | Status: DC
  Administered 2024-09-14: 500 mg via ORAL
  Filled 2024-09-11 (×2): qty 2

## 2024-09-11 MED ORDER — HALOPERIDOL LACTATE 5 MG/ML IJ SOLN
5.0000 mg | Freq: Once | INTRAMUSCULAR | Status: AC
  Administered 2024-09-11: 5 mg via INTRAMUSCULAR
  Filled 2024-09-11: qty 1

## 2024-09-11 MED ORDER — LORAZEPAM 2 MG/ML IJ SOLN
2.0000 mg | Freq: Four times a day (QID) | INTRAMUSCULAR | Status: DC | PRN
  Administered 2024-09-12 (×2): 2 mg via INTRAMUSCULAR
  Filled 2024-09-11 (×2): qty 1

## 2024-09-11 MED ORDER — OLANZAPINE 5 MG PO TABS
15.0000 mg | ORAL_TABLET | Freq: Every day | ORAL | Status: DC
  Administered 2024-09-12: 15 mg via ORAL
  Filled 2024-09-11 (×2): qty 1

## 2024-09-11 MED ORDER — LORAZEPAM 2 MG PO TABS
2.0000 mg | ORAL_TABLET | Freq: Four times a day (QID) | ORAL | Status: DC | PRN
  Administered 2024-09-11 – 2024-09-13 (×4): 2 mg via ORAL
  Filled 2024-09-11 (×6): qty 1

## 2024-09-11 MED ORDER — LORAZEPAM 2 MG/ML IJ SOLN
2.0000 mg | Freq: Once | INTRAMUSCULAR | Status: AC
  Administered 2024-09-11: 2 mg via INTRAMUSCULAR
  Filled 2024-09-11: qty 1

## 2024-09-11 NOTE — ED Notes (Signed)
 ivc/consult done/patient is recommended for inpatient psychiatric admission.

## 2024-09-11 NOTE — ED Notes (Signed)
 Pt standing out of room stating  I'm well I just want to see what the fuck yall would do  Pt redirected back into the room. Pt yelling and chanting in room Security at bedside

## 2024-09-11 NOTE — ED Notes (Signed)
 IVC patient accepted to [facility] 12/27  no transport today 12/26

## 2024-09-11 NOTE — ED Notes (Signed)
 Pt yelling in room upon assessment this morning. Pt came out of room and is talking loudly but not violent. Pt is interacting with a fellow pt and has been redirected to room several times but insists on standing in hallway. Officer directing pt to room. Pt is now asking for several things to prevent going back into room

## 2024-09-11 NOTE — ED Notes (Signed)
 Lunch tray provided to pt.

## 2024-09-11 NOTE — BH Assessment (Signed)
 Referral information for Psychiatric Hospitalization faxed to;   Service Provider Phone  [facility]  [phone]  [facility]  [phone]  [facility]  [phone]  [facility]  [phone]  [facility]  [phone]  [facility]  [phone]  [facility]  [phone]  [facility]  [phone]  [facility]  [phone]  [facility]  [phone]  [facility]  [phone]  [place] [facility]  [phone]  [facility]  [phone]  [facility]  [phone]  [facility]  [phone]

## 2024-09-11 NOTE — ED Notes (Signed)
 MD notified of htn and tachycardia. No new orders at this time. Pt has no c/o. Denies sob, headache or chestpain

## 2024-09-11 NOTE — ED Notes (Signed)
 At this time, this EDT, assisted this pt getting dressed in the shower with security as well. Pt was not cooperative but redirectable. Pt is now in room 26. No other needs at this time.

## 2024-09-11 NOTE — BH Assessment (Signed)
 Patient has been accepted to [facility].  Patient assigned to [facility] Accepting physician is Dr. Millie Manners.  Paget report to [phone] option 2.  Representative was [employer].   ER Staff is aware of it:  Mikal, ER Secretary  Norlene, Patient's Nurse      Address: [address] [place], [place] [address]   Bed is available today but will hold it for tomorrow in the event their is no transportation.

## 2024-09-11 NOTE — ED Notes (Signed)
 Patient in room washing her hair in the sink at this time.

## 2024-09-11 NOTE — ED Notes (Signed)
 Pt asleep upon RN rounding. Meal tray provided. Rise and fall of chest that are even and unlabored noted.

## 2024-09-11 NOTE — ED Notes (Signed)
 ED Notes by Theophilus Mould, NT (09/11/2024 09:26)

## 2024-09-11 NOTE — ED Notes (Signed)
 Agitated.  Cleaning the window on the door saying I'm cleaning the demons. Patient yelling, hitting the wall with fists.  Unable to verbally de-escalate patient.  Medicated per [facility], continue to monitor.

## 2024-09-11 NOTE — Consult Note (Signed)
 [facility] Psychiatric Consult Initial  Patient Name: .Alyssa Mathis  MRN: [id number]  DOB: 09-17-1986  Consult Order details:  Orders (From admission, onward)     Start     Ordered   09/09/24 2311  CONSULT TO CALL ACT TEAM       Ordering Provider: Neomi [name] SAILOR, DO  Provider:  (Not yet assigned)  Question:  Reason for Consult?  Answer:  Psych consult   09/09/24 2311   09/09/24 2311  IP CONSULT TO PSYCHIATRY       Ordering Provider: Neomi [name] SAILOR, DO  Provider:  (Not yet assigned)  Question:  Reason for consult:  Answer:  Medication management   09/09/24 2311             Mode of Visit: In person    Psychiatry Consult Evaluation  Service Date: September 11, 2024 LOS:  LOS: 0 days  Chief Complaint What's going on  Primary Psychiatric Diagnoses   Schizoaffective disorder, bipolar type (HCC)   Assessment   Alyssa Mathis is a 38 y.o. female admitted: Presented to the EDfor 09/09/2024 11:00 PM for respondent has been previously diagnosed with schizoaffective disorder, bipolar type and is not currently taking oral medications as prescribed. Respondent has recently had several encounters with the police department that have ended in her arrest. 09/02/2024 arrested for disorderly conduct and possession of marijuana. While at [facility] detention center the respondent's behavior has been unpredictable including but not limited to spitting on others, not wearing necessary clothing, playing in the toilet, throwing urine, and becoming verbally aggressive. The respondent is delusional and having hallucinations. She believes that she is a judge and plans to help Alyssa Mathis figure out how the law works. . She carries the psychiatric diagnoses of schizoaffective disorder, bipolar type and has a past medical history of  unknown.   Due to patient's current presentation, including disorganized behavior, incoherent speech, delusional thought content, aggression, and inability to  respond to redirection, patient is at risk to self and others. Involuntary commitment should be upheld. Patient is recommended for inpatient psychiatric admission.  09/11/2024: Patient remains unable to participate in full comprehensive psychiatric evaluation at this time. Patient continues to require multiple intramuscular agitation medications due to escalating behaviors including screaming, banging on walls and doors, and other documented behaviors noted in chart review. Patient is not responding to verbal redirection attempts.  Patient continues to display manic symptoms including increased psychomotor agitation, aggressive behaviors, pressured speech, disorganized behavior and escalating energy levels. Patient also demonstrates psychotic symptoms as previously documented. Patient remains difficult to engage in conversation due to the above presentation.  Due to the severity of patient's behaviors and continued need for frequent intramuscular agitation medications, medication adjustments have been made. Patient's scheduled daily Zyprexa  dose has been increased to 15 mg daily, which was listed as her initial dose on prior-to-admission medications. Patient's EKG today revealed a QTc of 480 milliseconds. As a result, the psychiatry team has added Ativan  as an oral or intramuscular as-needed agitation medication to help manage patient's behaviors and provide an alternative intervention given QTc prolongation.  The psychiatry team is also adding Depakote  to patient's current medication regimen. Patient current urine pregnancy is negative. This decision is based on the severity of patient's presentation, including persistent manic and psychotic symptoms with dangerous behaviors requiring multiple daily intramuscular interventions, lack of adequate response to current antipsychotic monotherapy, and the need for more aggressive mood stabilization. The psychiatry team is aware of the risks and  benefits of this  medication, including reproductive risks in females of childbearing age. However, given the acute severity of this patient's presentation, the immediate safety concerns, and the critical need for rapid stabilization to prevent harm to self or others, the benefits of initiating Depakote  outweigh the potential risks at this time. The patient's current clinical state necessitates urgent intervention with a mood stabilizer to achieve adequate symptom control and behavioral management.    Diagnoses:  Active Hospital problems: Active Problems:   Schizoaffective disorder, bipolar type (HCC)   Aggressive behavior   Involuntary commitment    Plan   ## Psychiatric Medication Recommendations:  -Medical team has initiated patient home dose of Zyprexa  10mg  nightly- increased to 15 mg nightly by psychiatry team.  -Depakote  ER 500 mg daily initiated today -Ativan  PO or IM 2 mg PRN q 6 hours for acute agitation added to regimen -Of note, the use of QTc prolonging agents is not recommended in this patient's case given her current QTc of 480 milliseconds. Specifically, medications such as Geodon  (ziprasidone ), which are known to cause significant QTc prolongation, are contraindicated at this time due to the increased risk of potentially life-threatening cardiac arrhythmias including torsades de pointes. Medication selection has been adjusted accordingly to minimize further QTc prolongation while targeting patient's psychiatric symptoms.  ## Medical Decision Making Capacity: Not specifically addressed in this encounter  ## Further Work-up:   -- Pending EKG, due to patient's behaviors may not be able to obtain this-most recent EKG completed on 09/11/2024 showed QTc of 480 -- Pertinent labwork reviewed earlier this admission includes: CBC, ethanol, CMP, urine drug screen   ## Disposition:--Patient is recommended for inpatient psychiatric admission  ## Behavioral / Environmental: - No specific recommendations  at this time.     ## Safety and Observation Level:  - Based on my clinical evaluation, I estimate the patient to be at low risk of self harm in the current setting. - At this time, we recommend  routine. This decision is based on my review of the chart including patient's history and current presentation, interview of the patient, mental status examination, and consideration of suicide risk including evaluating suicidal ideation, plan, intent, suicidal or self-harm behaviors, risk factors, and protective factors. This judgment is based on our ability to directly address suicide risk, implement suicide prevention strategies, and develop a safety plan while the patient is in the clinical setting. Please contact our team if there is a concern that risk level has changed.  CSSR Risk Category:C-SSRS RISK CATEGORY: No Risk  Suicide Risk Assessment: Patient has following modifiable risk factors for suicide: medication noncompliance, lack of access to outpatient mental health resources, and active mental illness (to encompass adhd, tbi, mania, psychosis, trauma reaction), which we are addressing by recommending inpatient psychiatric admission for further monitoring and stabilization. Patient has following non-modifiable or demographic risk factors for suicide: psychiatric hospitalization Patient has the following protective factors against suicide: UTA  Thank you for this consult request. Recommendations have been communicated to the primary team.  We will sign off at this time.   Zelda Sharps, NP        History of Present Illness  Relevant Aspects of Hospital ED   Patient Report:  Patient was brought in under involuntary commitment from detention center due to concerning behavioral presentation. Per IVC papers, patient has been acting unpredictably with behaviors including spitting on others, not wearing necessary clothing, playing in toilet, throwing urine, and becoming verbally aggressive. IVC  documentation reports patient  has expressed the delusional belief that she is a judge and plans to help Alyssa Mathis figure out how the law works.  Last evening, patient required intramuscular medications due to behavioral concerns. Per nursing report and review of nursing notes today, patient also required intramuscular agitation medications as she was noted to be acting bizarrely and erratically. Per documentation review, patient came out of her room speaking incoherently, banging hands on walls, with pressured speech and elevated tone of voice. Patient did not respond to any verbal redirection attempts.  This provider and psychiatry team were at bedside to assess patient. Patient was noted to be acting bizarrely. As soon as this provider entered the room, patient stated what is going on I took the words right out of your mouth. On mental status examination, patient presented with disheveled appearance, pressured speech, and loud speech. Patient then began speaking in incoherent speech.  Psych ROS:  Depression: UTA Anxiety:  UTA Mania (lifetime and current): ACT team on IVC papers reported history of schizoaffective disorder Psychosis: (lifetime and current): ACT team on IVC papers reported history of schizoaffective disorder      Psychiatric and Social History  Psychiatric History:  Information collected from limited history obtained during this exam due to patient behavior  Prev Dx/Sx: Schizoaffective disorder, bipolar type Current Psych Provider: UTA Home Meds (current): Zyprexa  listed in patient's prior to arrival medications Previous Med Trials: Risperidone  Therapy: UTA  Prior Psych Hospitalization: UTA  Prior Self Harm: UTA Prior Violence: UTA  Family Psych History: UTA Family Hx suicide: UTA  Social History:   Educational Hx: UTA Occupational Hx: UTA Legal Hx: Was brought to the emergency department from detention center Living Situation: UTA Spiritual Hx:  UTA Access to weapons/lethal means: UTA   Substance History Alcohol: UTA  Type of alcohol UTA Last Drink UTA Number of drinks per day UTA History of alcohol withdrawal seizures UTA History of DT's UTA Tobacco: UTA Illicit drugs: UTA Prescription drug abuse: UTA Rehab hx: UTA  Exam Findings  Physical Exam: Reviewed and agree with the physical exam findings conducted by the medical provider Vital Signs:  Temp:  [97.9 F (36.6 C)-98 F (36.7 C)] 98 F (36.7 C) (12/26 0840) Pulse Rate:  [74-92] 92 (12/26 0840) Resp:  [18-19] 18 (12/26 0840) BP: (133-148)/(96-98) 148/98 (12/26 0840) SpO2:  [97 %-100 %] 100 % (12/26 0840) Blood pressure (!) 148/98, pulse 92, temperature 98 F (36.7 C), temperature source Oral, resp. rate 18, SpO2 100%, unknown if currently breastfeeding. There is no height or weight on file to calculate BMI.    Mental Status Exam: General Appearance: Disheveled  Orientation:  Other:  UTA  Memory:  UTA  Concentration:  Concentration: Poor  Recall:  Poor  Attention  Poor  Eye Contact:  Darting  Speech:  Pressured  Language:  Poor  Volume:  Increased  Mood: UTA  Affect:  Labile  Thought Process:  Disorganized and Irrelevant  Thought Content:  Illogical  Suicidal Thoughts:  UTA  Homicidal Thoughts:  UTA  Judgement:  Poor  Insight:  poor  Psychomotor Activity:  Normal  Akathisia:  UTA  Fund of Knowledge:  UTA      Assets:  Others:  UTA  Cognition:  Impaired due to current presentation  ADL's:  Intact  AIMS (if indicated):        Other History   These have been pulled in through the EMR, reviewed, and updated if appropriate.  Family History:  The patient's family history includes  Diabetes in her maternal grandfather; Hypertension in her maternal grandfather, maternal grandmother, and sister.  Medical History: Past Medical History:  Diagnosis Date   Anemia    Hypertension    Post partum depression    Pregnancy induced hypertension     Schizoaffective disorder (HCC)     Surgical History: Past Surgical History:  Procedure Laterality Date   NO PAST SURGERIES       Medications:  Current Medications[1]  Allergies: Allergies[2]  Zelda Sharps, NP This note was created using Dragon dictation software. Please excuse any inadvertent transcription errors. Case was discussed with supervising physician Dr. Jadapalle who is agreeable with current plan.        [1]  Current Facility-Administered Medications:    OLANZapine  (ZYPREXA ) tablet 15 mg, 15 mg, Oral, QHS, Seletha Zimmermann B, NP  Current Outpatient Medications:    OLANZapine  (ZYPREXA ) 10 MG tablet, Take 10 mg by mouth at bedtime. Take along with one 5 mg tablet for total 15 mg at bedtime, Disp: , Rfl:    OLANZapine  (ZYPREXA ) 5 MG tablet, Take 5 mg by mouth at bedtime. Take along with one 10 mg tablet for total 15 mg at bedtime, Disp: , Rfl:    risperiDONE  (RISPERDAL ) 3 MG tablet, Take 3 mg by mouth at bedtime. 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

## 2024-09-11 NOTE — ED Notes (Signed)
 Pt given dinner tray.

## 2024-09-11 NOTE — ED Notes (Signed)
 Patient in room pulling things out the drawers  I put things back and notified nurse

## 2024-09-11 NOTE — ED Notes (Signed)
 Report given to Zach, CHARITY FUNDRAISER. Pt transported to room after taking a shower

## 2024-09-11 NOTE — ED Notes (Signed)
 This RN called dietary to bring new tray

## 2024-09-12 MED ORDER — ZIPRASIDONE MESYLATE 20 MG IM SOLR
20.0000 mg | Freq: Once | INTRAMUSCULAR | Status: AC
  Administered 2024-09-12: 20 mg via INTRAMUSCULAR
  Filled 2024-09-12: qty 20

## 2024-09-12 NOTE — ED Notes (Signed)
 Spoke with patient about volume in which is singing/talking.  Requested that she used a lower volume.  Explained to patient that she was disturbing other patient on the unit and to some it sounded like she was being aggressive.  Patient states she will be quieter at this time.

## 2024-09-12 NOTE — ED Notes (Addendum)
 Secretary called and NO FEMALE TRANSPORT available today for patient.  Report not called at this time due to not having transportation. Number for report to [facility]: [phone] option 2.

## 2024-09-12 NOTE — ED Notes (Signed)
 Per C-com no female transport for today 09-12-24

## 2024-09-12 NOTE — ED Notes (Signed)
 Patient standing in hallway talking with officer on duty.

## 2024-09-12 NOTE — ED Notes (Signed)
 Patient continues to come out of room and it is getting more difficult to redirect her back into her room.  Patient also states she does not feel she can self calm at this time.

## 2024-09-12 NOTE — ED Notes (Signed)
 Patient was sleeping while lunch tray was being passed out.

## 2024-09-12 NOTE — ED Notes (Signed)
 Patient woke up around 0820am. Screaming and verbally threatening staff. RN offered PO ativan  patient did not take it. Patient verbally escalating and threatening to punch nurse. Security was able to help verbally de-escalate patient to allow shot to be given  without holding patient down.

## 2024-09-12 NOTE — ED Notes (Signed)
 Patient is IVC pending admit to [facility]

## 2024-09-12 NOTE — ED Notes (Signed)
 The patient has cleaned herself, changed her scrubs and disposed of applicable hygiene items. No acute needs at this time.

## 2024-09-12 NOTE — ED Notes (Signed)
 Patient has been accepted to [facility]/Accepting physician  is Dr. Millie Manners page or call report to [phone] option 2 rep was Alexis bed was available 09/11/24  but will hold 09/12/24 in the event there is no transportation there was no transport today so please call to see if bed is still available before sending patient

## 2024-09-12 NOTE — ED Notes (Signed)
 Patient came to this NT and said she had blood all over herself, and would like to clean herself up. This NT gave her a washcloth, towel, disposable washcloths, and a new set of scrubs with underwear and socks. There was no visible blood in the bathroom. The patient wanted to take a shower, and we informed her that the water  isn't turned on until later in the morning. No acute needs at this time.

## 2024-09-12 NOTE — ED Notes (Addendum)
 Patient threatened RN by lunging at RN and attempting to bite after administering Geodon . Patient did not have to be held down for shot. Patient also put up a gun symbol with her hand and mimicked  shooting RN and other staff. Patient is beating on door at this time as well as yelling and screaming.

## 2024-09-12 NOTE — ED Notes (Signed)
 20 minutes of personal time spent with patient listening to patient and explaining IVC and admission. Patient experiencing word salad at this time and flight of ideas. One sentence not connecting to the next. Patient was allowed to talk and express all feelings.

## 2024-09-12 NOTE — ED Notes (Signed)
 Security attempting to get patient back into her room. Patient back in room at this moment. Patient continues to come in and out of room for multiple complaints.

## 2024-09-12 NOTE — ED Notes (Signed)
 Phone was taken back from patient.  Ice chips provided per patients request.

## 2024-09-12 NOTE — ED Notes (Addendum)
 Patient needed to throw away toilet paper. Security pulled up trashcan for patient to throw away toilet paper. Patient started yelling at security in hall way camera operator. NT was able to verbally de-escalate patient and get her to go back into her room.

## 2024-09-12 NOTE — ED Notes (Signed)
 Patient singing loudly in her room.  Patient will come out and can be redirected back into room at this time.

## 2024-09-12 NOTE — ED Notes (Signed)
 This tech gave pt new blanket, pillow case, and bed sheet. Pt wanting to change her own linens

## 2024-09-12 NOTE — ED Notes (Signed)
 Pt out of shower.

## 2024-09-12 NOTE — ED Notes (Signed)
 Patient singing/yelling at this moment in her room. Not in hallway.

## 2024-09-12 NOTE — ED Notes (Signed)
 Singing/yelling loudly in her room.

## 2024-09-12 NOTE — ED Provider Notes (Signed)
 Emergency Medicine Observation Re-evaluation Note  Alyssa Mathis is a 38 y.o. female, seen on rounds today.  Pt initially presented to the ED for complaints of Psychiatric Evaluation Currently, the patient is resting in no acute distress.  Physical Exam  BP (!) 148/97 (BP Location: Left Arm)   Pulse 93   Temp 98.7 F (37.1 C) (Oral)   Resp 17   SpO2 98%  Physical Exam General: Resting in no acute distress Cardiac: Appears well-perfused Lungs: Normal work of breathing Psych: Calm, not agitated  ED Course / MDM  EKG:   I have reviewed the labs performed to date as well as medications administered while in observation.  No acute events since last update.  Plan  Current plan is for psychiatric placement.    Clarine Ozell LABOR, MD 09/12/24 [phone]

## 2024-09-12 NOTE — ED Notes (Signed)
 IVC/pt accepted to [facility] on 09/12/24

## 2024-09-12 NOTE — ED Notes (Signed)
 Patient was provided a phone when she woke up. Patient was also provided her lunch.

## 2024-09-12 NOTE — ED Notes (Signed)
 Patient verbally loud, but not aggressively, no physical aggression.  Praying to self loudly.

## 2024-09-12 NOTE — ED Notes (Signed)
Patient resting in bed.  RR even and unlabored.

## 2024-09-12 NOTE — ED Notes (Signed)
 Patient given snack at the bedside. No acute needs at this time.

## 2024-09-12 NOTE — ED Notes (Signed)
Patient provided shower.

## 2024-09-12 NOTE — ED Notes (Addendum)
 Patient still verbally yelling at staff and security. In addition to making statements of hitting and bitting RN. EDP put more medication in one time order.

## 2024-09-12 NOTE — ED Provider Notes (Signed)
 Patient is agitated, pacing about the room and aggressive with staff.  IM Ativan  had little effect, will give 20 mg of IM Geodon  for patient and staff safety.   Arlander Charleston, MD 09/12/24 [phone]

## 2024-09-12 NOTE — ED Notes (Signed)
 Patient awake and yelling in room.

## 2024-09-12 NOTE — ED Notes (Signed)
 Pt standing in bathroom looking into mirror singing

## 2024-09-13 MED ORDER — DROPERIDOL 2.5 MG/ML IJ SOLN
5.0000 mg | Freq: Once | INTRAMUSCULAR | Status: AC
  Administered 2024-09-13: 5 mg via INTRAMUSCULAR

## 2024-09-13 MED ORDER — MIDAZOLAM HCL (PF) 10 MG/2ML IJ SOLN
5.0000 mg | Freq: Once | INTRAMUSCULAR | Status: AC
  Administered 2024-09-13: 5 mg via INTRAMUSCULAR
  Filled 2024-09-13: qty 2

## 2024-09-13 NOTE — ED Notes (Signed)
 Patient cussing at officer and staff. Patient was asked to go back in her room, patient refused to go back in her room. Standing outside the door yelling, cussing, talking loud, in officer personal face. RN notified.

## 2024-09-13 NOTE — ED Notes (Signed)
 Patient asked for water , staff gave patient water  and patient is pouring water  into the toilet. Patient is being redirected by staff to go back in her room. Patient is using the word nigga.

## 2024-09-13 NOTE — ED Notes (Signed)
 Left message at # for [facility] for a call back to make sure patient still has a bed offer there if we have transportation today.

## 2024-09-13 NOTE — ED Notes (Signed)
 CALLED C COM FOR [facility]IFF'S TRANSP[place]  WAITING FOR VERIFCATION OF TRANSPORT [phone]

## 2024-09-13 NOTE — ED Notes (Signed)
 Spoke with Nat at [facility], pt still has room available pending transport.

## 2024-09-13 NOTE — ED Notes (Signed)
 Pt repeatedly coming out of room and being verbally aggressive to [employer] officer. Officer able to redirect pt back into room. Pt encouraged to eat breakfast.

## 2024-09-13 NOTE — ED Notes (Signed)
 Spoke with Nat at [facility] again, they will hold bed for tomorrow. Will try to transfer tomorrow pending Sheriff availability.

## 2024-09-13 NOTE — ED Notes (Signed)
 Per diplomatic services operational officer, no transport available today. [facility] called to inform, waiting on call back.

## 2024-09-13 NOTE — ED Notes (Signed)
 Left another message at # for [facility].

## 2024-09-13 NOTE — ED Notes (Signed)
 Pt continues to come out of room and yell at staff and [employer]. Attempted to redirect pt back to room but pt becoming more agitated and not following directions. EDT Mykia able to get pt back into room.

## 2024-09-13 NOTE — ED Notes (Signed)
 Pt still coming out of room being loud, yelling at staff and [employer]. Pt becoming verbally aggressive towards [employer]. Pt has been instructed many times to quite down and return to her room. Pt becoming more difficult to redirect. Dr Claudene placed orders for IM meds to be given.

## 2024-09-13 NOTE — ED Notes (Signed)
 Patient out into hallway, singing and cursing.  Asked patient to please go back into her room.  Patient heading for restroom but stops at door and talking.

## 2024-09-13 NOTE — ED Notes (Signed)
 Per c com  called Proofreader  no female transport today 12/28

## 2024-09-13 NOTE — ED Notes (Signed)
Patient is currently sleeping at this time.

## 2024-09-13 NOTE — ED Notes (Signed)
 Pt attempted to enter another pt's room, was quickly stopped by [employer] and redirected to her room. Pt given breakfast, currently singing in room.

## 2024-09-13 NOTE — ED Notes (Signed)
 Patient yelling and screaming at officer and staff. Covering a glove over her mouth and nose. Yelling, coming in and out towards staff personal space. Redirecting patient to go back to her room. RN notified.

## 2024-09-13 NOTE — ED Notes (Signed)
 Pt continues to come in and out of room. Pt redirected multiple times by EDT and [employer]. Pt states she wants door open and this EDT explained continued exiting the room will result in door being closed.

## 2024-09-13 NOTE — ED Notes (Signed)
"  Pt provided dinner tray  "

## 2024-09-13 NOTE — ED Notes (Signed)
 IVC waiting for transport to [facility], will see if transport available 12/29 Monday.

## 2024-09-13 NOTE — ED Notes (Signed)
 Called administrator of the jail, he stated no female or female transport  today 12/28

## 2024-09-13 NOTE — Consult Note (Signed)
 [facility] Psychiatric Consult Initial  Patient Name: .Alyssa Mathis  MRN: [id number]  DOB: [birth year]  Consult Order details:  Orders (From admission, onward)     Start     Ordered   09/09/24 2311  CONSULT TO CALL ACT TEAM       Ordering Provider: Neomi [name] SAILOR, DO  Provider:  (Not yet assigned)  Question:  Reason for Consult?  Answer:  Psych consult   09/09/24 2311   09/09/24 2311  IP CONSULT TO PSYCHIATRY       Ordering Provider: Neomi [name] SAILOR, DO  Provider:  (Not yet assigned)  Question:  Reason for consult:  Answer:  Medication management   09/09/24 2311             Mode of Visit: In person    Psychiatry Consult Evaluation  Service Date: September 13, 2024 LOS:  LOS: 0 days  Chief Complaint What's going on  Primary Psychiatric Diagnoses   Schizoaffective disorder, bipolar type (HCC)   Assessment   Alyssa Mathis is a 38 y.o. female admitted: Presented to the EDfor 09/09/2024 11:00 PM for respondent has been previously diagnosed with schizoaffective disorder, bipolar type and is not currently taking oral medications as prescribed. Respondent has recently had several encounters with the police department that have ended in her arrest. 09/02/2024 arrested for disorderly conduct and possession of marijuana. While at [facility] detention center the respondent's behavior has been unpredictable including but not limited to spitting on others, not wearing necessary clothing, playing in the toilet, throwing urine, and becoming verbally aggressive. The respondent is delusional and having hallucinations. She believes that she is a judge and plans to help Corlis Bohr figure out how the law works. . She carries the psychiatric diagnoses of schizoaffective disorder, bipolar type and has a past medical history of  unknown.   Due to patient's current presentation, including disorganized behavior, incoherent speech, delusional thought content, aggression, and inability to  respond to redirection, patient is at risk to self and others. Involuntary commitment should be upheld. Patient is recommended for inpatient psychiatric admission.  09/11/2024: Patient remains unable to participate in full comprehensive psychiatric evaluation at this time. Patient continues to require multiple intramuscular agitation medications due to escalating behaviors including screaming, banging on walls and doors, and other documented behaviors noted in chart review. Patient is not responding to verbal redirection attempts.  Patient continues to display manic symptoms including increased psychomotor agitation, aggressive behaviors, pressured speech, disorganized behavior and escalating energy levels. Patient also demonstrates psychotic symptoms as previously documented. Patient remains difficult to engage in conversation due to the above presentation.  Due to the severity of patient's behaviors and continued need for frequent intramuscular agitation medications, medication adjustments have been made. Patient's scheduled daily Zyprexa  dose has been increased to 15 mg daily, which was listed as her initial dose on prior-to-admission medications. Patient's EKG today revealed a QTc of 480 milliseconds. As a result, the psychiatry team has added Ativan  as an oral or intramuscular as-needed agitation medication to help manage patient's behaviors and provide an alternative intervention given QTc prolongation.  The psychiatry team is also adding Depakote  to patient's current medication regimen. Patient current urine pregnancy is negative. This decision is based on the severity of patient's presentation, including persistent manic and psychotic symptoms with dangerous behaviors requiring multiple daily intramuscular interventions, lack of adequate response to current antipsychotic monotherapy, and the need for more aggressive mood stabilization. The psychiatry team is aware of the risks and  benefits of this  medication, including reproductive risks in females of childbearing age. However, given the acute severity of this patient's presentation, the immediate safety concerns, and the critical need for rapid stabilization to prevent harm to self or others, the benefits of initiating Depakote  outweigh the potential risks at this time. The patient's current clinical state necessitates urgent intervention with a mood stabilizer to achieve adequate symptom control and behavioral management.  09/13/2024: Psychiatry attempted to round on patient again today. Patient was noted to be sleeping at the time of rounds. Given patient's previous behaviors requiring medication administration, this provider elected to allow patient to continue resting and did not disturb patient at this time.  Per report, patient has been accepted to the inpatient psychiatric unit at [facility]. However, there were issues with transportation arrangements today. Patient remains recommended for inpatient psychiatric admission and is currently pending transport to [facility].    Diagnoses:  Active Hospital problems: Active Problems:   Schizoaffective disorder, bipolar type (HCC)   Aggressive behavior   Involuntary commitment    Plan   ## Psychiatric Medication Recommendations:  -Medical team has initiated patient home dose of Zyprexa  10mg  nightly- increased to 15 mg nightly by psychiatry team.  -Depakote  ER 500 mg daily initiated today -Ativan  PO or IM 2 mg PRN q 6 hours for acute agitation added to regimen -Of note, the use of QTc prolonging agents is not recommended in this patient's case given her current QTc of 480 milliseconds. Specifically, medications such as Geodon  (ziprasidone ), which are known to cause significant QTc prolongation, are contraindicated at this time due to the increased risk of potentially life-threatening cardiac arrhythmias including torsades de pointes. Medication selection has been adjusted  accordingly to minimize further QTc prolongation while targeting patient's psychiatric symptoms.  ## Medical Decision Making Capacity: Not specifically addressed in this encounter  ## Further Work-up:   -- Pending EKG, due to patient's behaviors may not be able to obtain this-most recent EKG completed on 09/11/2024 showed QTc of 480 -- Pertinent labwork reviewed earlier this admission includes: CBC, ethanol, CMP, urine drug screen   ## Disposition:--Patient is recommended for inpatient psychiatric admission  ## Behavioral / Environmental: - No specific recommendations at this time.     ## Safety and Observation Level:  - Based on my clinical evaluation, I estimate the patient to be at low risk of self harm in the current setting. - At this time, we recommend  routine. This decision is based on my review of the chart including patient's history and current presentation, interview of the patient, mental status examination, and consideration of suicide risk including evaluating suicidal ideation, plan, intent, suicidal or self-harm behaviors, risk factors, and protective factors. This judgment is based on our ability to directly address suicide risk, implement suicide prevention strategies, and develop a safety plan while the patient is in the clinical setting. Please contact our team if there is a concern that risk level has changed.  CSSR Risk Category:C-SSRS RISK CATEGORY: No Risk  Suicide Risk Assessment: Patient has following modifiable risk factors for suicide: medication noncompliance, lack of access to outpatient mental health resources, and active mental illness (to encompass adhd, tbi, mania, psychosis, trauma reaction), which we are addressing by recommending inpatient psychiatric admission for further monitoring and stabilization. Patient has following non-modifiable or demographic risk factors for suicide: psychiatric hospitalization Patient has the following protective factors  against suicide: UTA  Thank you for this consult request. Recommendations have been communicated  to the primary team.  We will sign off at this time.   Alyssa Sharps, NP        History of Present Illness  Relevant Aspects of Hospital ED   Patient Report:  Patient was brought in under involuntary commitment from detention center due to concerning behavioral presentation. Per IVC papers, patient has been acting unpredictably with behaviors including spitting on others, not wearing necessary clothing, playing in toilet, throwing urine, and becoming verbally aggressive. IVC documentation reports patient has expressed the delusional belief that she is a judge and plans to help Corlis Bohr figure out how the law works.  Last evening, patient required intramuscular medications due to behavioral concerns. Per nursing report and review of nursing notes today, patient also required intramuscular agitation medications as she was noted to be acting bizarrely and erratically. Per documentation review, patient came out of her room speaking incoherently, banging hands on walls, with pressured speech and elevated tone of voice. Patient did not respond to any verbal redirection attempts.  This provider and psychiatry team were at bedside to assess patient. Patient was noted to be acting bizarrely. As soon as this provider entered the room, patient stated what is going on I took the words right out of your mouth. On mental status examination, patient presented with disheveled appearance, pressured speech, and loud speech. Patient then began speaking in incoherent speech.  Psych ROS:  Depression: UTA Anxiety:  UTA Mania (lifetime and current): ACT team on IVC papers reported history of schizoaffective disorder Psychosis: (lifetime and current): ACT team on IVC papers reported history of schizoaffective disorder      Psychiatric and Social History  Psychiatric History:  Information collected from  limited history obtained during this exam due to patient behavior  Prev Dx/Sx: Schizoaffective disorder, bipolar type Current Psych Provider: UTA Home Meds (current): Zyprexa  listed in patient's prior to arrival medications Previous Med Trials: Risperidone  Therapy: UTA  Prior Psych Hospitalization: UTA  Prior Self Harm: UTA Prior Violence: UTA  Family Psych History: UTA Family Hx suicide: UTA  Social History:   Educational Hx: UTA Occupational Hx: UTA Legal Hx: Was brought to the emergency department from detention center Living Situation: UTA Spiritual Hx: UTA Access to weapons/lethal means: UTA   Substance History Alcohol: UTA  Type of alcohol UTA Last Drink UTA Number of drinks per day UTA History of alcohol withdrawal seizures UTA History of DT's UTA Tobacco: UTA Illicit drugs: UTA Prescription drug abuse: UTA Rehab hx: UTA  Exam Findings  Physical Exam: Reviewed and agree with the physical exam findings conducted by the medical provider Vital Signs:  Temp:  [98 F (36.7 C)-98.5 F (36.9 C)] 98 F (36.7 C) (12/28 0600) Pulse Rate:  [83-101] 83 (12/28 0600) Resp:  [15-20] 15 (12/28 0600) BP: (160-175)/(111-134) 175/119 (12/28 0600) SpO2:  [98 %-99 %] 98 % (12/28 0600) Blood pressure (!) 175/119, pulse 83, temperature 98 F (36.7 C), temperature source Oral, resp. rate 15, SpO2 98%, unknown if currently breastfeeding. There is no height or weight on file to calculate BMI.    Mental Status Exam: General Appearance: Disheveled  Orientation:  Other:  UTA  Memory:  UTA  Concentration:  Concentration: Poor  Recall:  Poor  Attention  Poor  Eye Contact:  Darting  Speech:  Pressured  Language:  Poor  Volume:  Increased  Mood: UTA  Affect:  Labile  Thought Process:  Disorganized and Irrelevant  Thought Content:  Illogical  Suicidal Thoughts:  UTA  Homicidal  Thoughts:  UTA  Judgement:  Poor  Insight:  poor  Psychomotor Activity:  Normal  Akathisia:  UTA   Fund of Knowledge:  UTA      Assets:  Others:  UTA  Cognition:  Impaired due to current presentation  ADL's:  Intact  AIMS (if indicated):        Other History   These have been pulled in through the EMR, reviewed, and updated if appropriate.  Family History:  The patient's family history includes Diabetes in her maternal grandfather; Hypertension in her maternal grandfather, maternal grandmother, and sister.  Medical History: Past Medical History:  Diagnosis Date   Anemia    Hypertension    Post partum depression    Pregnancy induced hypertension    Schizoaffective disorder (HCC)     Surgical History: Past Surgical History:  Procedure Laterality Date   NO PAST SURGERIES       Medications:  Current Medications[1]  Allergies: Allergies[2]  Alyssa Sharps, NP This note was created using Dragon dictation software. Please excuse any inadvertent transcription errors. Case was discussed with supervising physician Dr. GORMAN who is agreeable with current plan.       [1]  Current Facility-Administered Medications:    divalproex  (DEPAKOTE  ER) 24 hr tablet 500 mg, 500 mg, Oral, Daily, Larry Alcock B, NP   LORazepam  (ATIVAN ) tablet 2 mg, 2 mg, Oral, Q6H PRN, 2 mg at 09/13/24 0826 **OR** LORazepam  (ATIVAN ) injection 2 mg, 2 mg, Intramuscular, Q6H PRN, Mathis Alyssa B, NP, 2 mg at 09/12/24 2315   OLANZapine  (ZYPREXA ) tablet 15 mg, 15 mg, Oral, QHS, Shannen Flansburg B, NP, 15 mg at 09/12/24 0030  Current Outpatient Medications:    OLANZapine  (ZYPREXA ) 10 MG tablet, Take 10 mg by mouth at bedtime. Take along with one 5 mg tablet for total 15 mg at bedtime, Disp: , Rfl:    OLANZapine  (ZYPREXA ) 5 MG tablet, Take 5 mg by mouth at bedtime. Take along with one 10 mg tablet for total 15 mg at bedtime, Disp: , Rfl:    risperiDONE  (RISPERDAL ) 3 MG tablet, Take 3 mg by mouth at bedtime. 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

## 2024-09-13 NOTE — ED Provider Notes (Signed)
 Patient increasingly agitated.  While she previously has been redirectable, now no longer responsive to this.  To facilitate staff and patient's safety, provide intramuscular calming agents.  After this, repeat EKG is performed with improved Qtc 464.  Continues to await transport to accepting facility.   .Critical Care  Performed by: Claudene Rover, MD Authorized by: Claudene Rover, MD   Critical care provider statement:    Critical care time (minutes):  30   Critical care time was exclusive of:  Separately billable procedures and treating other patients   Critical care was necessary to treat or prevent imminent or life-threatening deterioration of the following conditions:  Toxidrome   Critical care was time spent personally by me on the following activities:  Development of treatment plan with patient or surrogate, discussions with consultants, evaluation of patient's response to treatment, examination of patient, ordering and review of laboratory studies, ordering and review of radiographic studies, ordering and performing treatments and interventions, pulse oximetry, re-evaluation of patient's condition and review of old charts     Claudene Rover, MD 09/13/24 1406

## 2024-09-14 LAB — VALPROIC ACID LEVEL: Valproic Acid Lvl: <10 ug/mL — ABNORMAL LOW (ref 50–100)

## 2024-09-14 MED ORDER — AMLODIPINE BESYLATE 5 MG PO TABS
5.0000 mg | ORAL_TABLET | Freq: Every day | ORAL | Status: DC
  Administered 2024-09-14: 5 mg via ORAL
  Filled 2024-09-14: qty 1

## 2024-09-14 NOTE — ED Notes (Signed)
 Pt using phone at this time.

## 2024-09-14 NOTE — ED Provider Notes (Signed)
 Clinical Course as of 09/14/24 1609  Mon Sep 14, 2024  0822 Notified that patient has been declined from [facility] given several elevated blood pressures.  On review of chart she is not on any blood pressure medication.  Will initiate amlodipine  5 mg daily.  She has no evidence of acute renal insufficiency and has been here multiple hours [HD]    Clinical Course User Index [HD] Nicholaus Rolland BRAVO, MD      Nicholaus Rolland BRAVO, MD 09/14/24 [phone]

## 2024-09-14 NOTE — ED Notes (Signed)
 Meal provided

## 2024-09-14 NOTE — ED Provider Notes (Signed)
 Emergency Medicine Observation Re-evaluation Note  Alyssa Mathis is a 38 y.o. female, seen on rounds today.  Pt initially presented to the ED for complaints of Psychiatric Evaluation Currently, the patient is resting no distress.  Physical Exam  BP (!) 178/128   Pulse (!) 101   Temp 99.5 F (37.5 C)   Resp (!) 21   SpO2 100%  Physical Exam General: No distress Cardiac: Appears well-perfused Lungs: Normal work of breathing Psych: Not agitated  ED Course / MDM  EKG:   I have reviewed the labs performed to date as well as medications administered while in observation.  She did require acute sedation yesterday, no acute events on my shift.  Plan  Current plan is for psychiatric disposition.    Clarine Ozell LABOR, MD 09/14/24 [phone]

## 2024-09-14 NOTE — ED Notes (Signed)
 [place]  [facility]  called for  transfer to  eb[facility]

## 2024-09-14 NOTE — ED Notes (Addendum)
 Pt asked this RN to come into her room. Pt states she has learned her lesson, and does not want to go to another facility. I already missed Christmas with my kids. I have been in jail and now here and I have more important things to do than be at another facility. This RN told pt I would let the doctor know how she was feeling this morning and pt asked to talk with the doctor because I haven't seem them a lot. Pt eating breakfast at this time, and back in bed watching TV.

## 2024-09-29 ENCOUNTER — Ambulatory Visit: Payer: Self-pay | Admitting: Family Medicine

## 2024-09-29 ENCOUNTER — Other Ambulatory Visit: Payer: Self-pay

## 2024-09-29 ENCOUNTER — Emergency Department
Admission: EM | Admit: 2024-09-29 | Discharge: 2024-10-02 | Disposition: A | Discharge status: Other Acute Inpatient Hospital | Attending: Emergency Medicine | Admitting: Emergency Medicine

## 2024-09-29 DIAGNOSIS — F29 Unspecified psychosis not due to a substance or known physiological condition: Secondary | ICD-10-CM | POA: Diagnosis not present

## 2024-09-29 DIAGNOSIS — Z113 Encounter for screening for infections with a predominantly sexual mode of transmission: Secondary | ICD-10-CM

## 2024-09-29 DIAGNOSIS — Z3009 Encounter for other general counseling and advice on contraception: Secondary | ICD-10-CM

## 2024-09-29 DIAGNOSIS — F250 Schizoaffective disorder, bipolar type: Secondary | ICD-10-CM | POA: Diagnosis not present

## 2024-09-29 DIAGNOSIS — I10 Essential (primary) hypertension: Secondary | ICD-10-CM | POA: Diagnosis not present

## 2024-09-29 DIAGNOSIS — Z3202 Encounter for pregnancy test, result negative: Secondary | ICD-10-CM

## 2024-09-29 DIAGNOSIS — R45850 Homicidal ideations: Secondary | ICD-10-CM | POA: Diagnosis not present

## 2024-09-29 DIAGNOSIS — F309 Manic episode, unspecified: Secondary | ICD-10-CM | POA: Insufficient documentation

## 2024-09-29 LAB — CBC
HCT: 35.2 % — ABNORMAL LOW (ref 36.0–46.0)
Hemoglobin: 11.5 g/dL — ABNORMAL LOW (ref 12.0–15.0)
MCH: 29.7 pg (ref 26.0–34.0)
MCHC: 32.7 g/dL (ref 30.0–36.0)
MCV: 91 fL (ref 80.0–100.0)
Platelets: 213 K/uL (ref 150–400)
RBC: 3.87 MIL/uL (ref 3.87–5.11)
RDW: 12.6 % (ref 11.5–15.5)
WBC: 5 K/uL (ref 4.0–10.5)
nRBC: 0 % (ref 0.0–0.2)

## 2024-09-29 LAB — COMPREHENSIVE METABOLIC PANEL WITH GFR
ALT: 12 U/L (ref 0–44)
AST: 17 U/L (ref 15–41)
Albumin: 4.3 g/dL (ref 3.5–5.0)
Alkaline Phosphatase: 81 U/L (ref 38–126)
Anion gap: 11 (ref 5–15)
BUN: 9 mg/dL (ref 6–20)
CO2: 23 mmol/L (ref 22–32)
Calcium: 9.5 mg/dL (ref 8.9–10.3)
Chloride: 106 mmol/L (ref 98–111)
Creatinine, Ser: 0.7 mg/dL (ref 0.44–1.00)
GFR, Estimated: >60 mL/min
Glucose, Bld: 90 mg/dL (ref 70–99)
Potassium: 3.5 mmol/L (ref 3.5–5.1)
Sodium: 140 mmol/L (ref 135–145)
Total Bilirubin: 0.4 mg/dL (ref 0.0–1.2)
Total Protein: 7.4 g/dL (ref 6.5–8.1)

## 2024-09-29 LAB — URINE DRUG SCREEN
Amphetamines: NEGATIVE
Barbiturates: NEGATIVE
Benzodiazepines: NEGATIVE
Cocaine: NEGATIVE
Fentanyl: NEGATIVE
Methadone Scn, Ur: NEGATIVE
Opiates: NEGATIVE
Tetrahydrocannabinol: NEGATIVE

## 2024-09-29 LAB — RESP PANEL BY RT-PCR (RSV, FLU A&B, COVID)  RVPGX2
Influenza A by PCR: NEGATIVE
Influenza B by PCR: NEGATIVE
Resp Syncytial Virus by PCR: NEGATIVE
SARS Coronavirus 2 by RT PCR: NEGATIVE

## 2024-09-29 LAB — PREGNANCY, URINE
Preg Test, Ur: NEGATIVE
Preg Test, Ur: NEGATIVE

## 2024-09-29 LAB — WET PREP FOR TRICH, YEAST, CLUE
Clue Cell Exam: POSITIVE — AB
Trichomonas Exam: NEGATIVE
Yeast Exam: NEGATIVE

## 2024-09-29 LAB — ETHANOL: Alcohol, Ethyl (B): <15 mg/dL

## 2024-09-29 LAB — HM HIV SCREENING LAB: HM HIV Screening: NEGATIVE

## 2024-09-29 LAB — GROUP A STREP BY PCR: Group A Strep by PCR: NOT DETECTED

## 2024-09-29 MED ORDER — METRONIDAZOLE 500 MG PO TABS: 500.0000 mg | ORAL_TABLET | Freq: Two times a day (BID) | ORAL | Status: AC

## 2024-09-29 MED ORDER — ACETAMINOPHEN 325 MG PO TABS
650.0000 mg | ORAL_TABLET | Freq: Once | ORAL | Status: AC
  Administered 2024-09-29: 650 mg via ORAL
  Filled 2024-09-29: qty 2

## 2024-09-29 NOTE — BH Assessment (Addendum)
 Telepsych reassessment/consult requested by this clinical research associate.

## 2024-09-29 NOTE — ED Notes (Signed)
 Pt continues to sing loudly and agitation seems to be escalating. This nurse attempted to deescalate pt in room and pt stops singing quickly and said she has a fever and if she got sick with COVID in this hospital she was going to sue because she isn't even supposed to be here. Pt also states her birthday is [birth year] and she has plans so she would like to just go home now.

## 2024-09-29 NOTE — ED Notes (Signed)
 Jovial, singing and laughing loudly, manic, cooperative.
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
## 2024-09-29 NOTE — ED Notes (Signed)
 Urine in lab, upreg added.

## 2024-09-29 NOTE — ED Notes (Signed)
 Black and blue wallet Tooth brush and paste 2 phones Keys Meds Phone charger Cup Condoms Black shoes White socks Pink, blue, and green pants and shirt Black bra Silver bracelets

## 2024-09-29 NOTE — ED Notes (Signed)
 Pt singing loudly in room. Officer closed door to exam room.

## 2024-09-29 NOTE — ED Notes (Signed)
 Pt denies pain, sob, nausea or dizziness. Restless, ambulatory with steady gait. Requesting food and drink, endorses hunger thirst, and wants her phone to get a number of her fiance to let him know she is OK.

## 2024-09-29 NOTE — Progress Notes (Signed)
 The patient was dispensed #14 metronidazole  today. I provided counseling today regarding the medication. We discussed the medication, the side effects and when to call clinic. Patient given the opportunity to ask questions. Questions answered. BV handout given and reviewed with pt. Larraine JONELLE Novak, RN

## 2024-09-29 NOTE — ED Notes (Signed)
 Dinner tray provided to pt

## 2024-09-29 NOTE — Progress Notes (Signed)
 " [facility] Department STI clinic [address], [address] [place] [address] Main phone: [phone]  STI screening visit  Subjective:  Alyssa Mathis is a 39 y.o. female being seen today for an STI screening visit. The patient reports they do not have symptoms.    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 Initially reports she wants a pregnancy test. States she had a period at the end of December but report I can be pregnant. Denies vaginal symptoms. Agrees to STI Testing.    Reproductive considerations Patient reports they are pregnant. They do not desire a pregnancy in the next year. Patient is currently using no method - no contraceptive precautions to prevent pregnancy. They reported they are not interested in discussing contraception today.    No LMP recorded (lmp unknown).  Does the patient using douching products? unknown  Patient's routine cervical screening I sunknown  See flowsheet for further details and programmatic requirements Hyperlink available at the top of the signed note in blue.  Flow sheet content below:  Pregnancy Intention Screening Does the patient want to become pregnant in the next year?: No Does the patient's partner want to become pregnant in the next year?: No Would the patient like to discuss contraceptive options today?: No Counseling Medication side effects discussed with patient?: Yes Contact  card(s) given to patient: No Patient counseled to abstain from sex for: 7 days Patient counseled to avoid alcohol for?: 10 days Patient counseled to use condoms with all sex: Condoms given RTC in 2-3 weeks for test results: Yes Clinic will call if test results abnormal before test result appt.: Yes Report card filled out: No Patient to return to the clinic in 3 months for TOC: Yes BCM given today through [facility] clinic: No Immunizations: Immunization history assessed, Referred Test results given to patient Patient counseled to use condoms with all sex: Condoms given STD Treatment Patient counseled to abstain from sex for: 7 days Patient counseled to avoid alcohol for?: 10 days   Screening for MPX risk:  Unexplained rash?  No   MSM?  No   Multiple or anonymous sex partners?  No   Any close or sexual contact with a person  diagnosed with MPX?  No   Any outside the [place]  where MPX is endemic?  No   High clinical suspicion for MPX?    -Unlikely to be chickenpox    -Lymphadenopathy    -Rash that presents in same phase of       evolution on any given body part  No   Does this patient meet CDC recommendations for vaccination against MPOX? No  You already have or anticipate having the following risks:  Your sex partner has the following risks: You're traveling to a county with a clade I MPOX outbreak and anticipate these risks: Occupational exposure  You had known or suspected exposure to someone with monkeypox You had a sex partner in the past 2 weeks who was  diagnosed with monkeypox You are a gay, bisexual, or other man who has sex with men, or are transgender or nonbinary and in the past 6 months have had any of the following: - A new diagnosis of one or more sexually transmitted diseases (e.g., chlamydia, gonorrhea, or syphilis) - More than one sex partner You have had any of the following in the past 6 months: - Sex at a commercial sex venue (like a sex club or bathhouse) - Sex related to  a large commercial event   or in a geographic area (city or county for example) where mpox virus transmission is occurring Sex with a new partner Sex at a commercial sex venue (e.g., a sex club or bathhouse) Sex in it consultant for money, goods, drugs, or other trade Sex in association with a large public event (e.g., a rave, party, or festival) i.e. certain people who work in a laboratory or healthcare facility   Infectious disease screenings: Vaccinated against HPV? Unknown  HIV Ever had a positive? Unknown Last test: 2024 Results in chart:  No results found for: HMHIVSCREEN  Lab Results  Component Value Date   HIV Non Reactive 06/14/2023     Hep B Hep B status: unknown or no prior testing Received HBV vaccination? Unknown Received HBV testing for immunity? Unknown Results in chart:  No components found for: HMHEPBSCREEN  Do they qualify for HBV screening today? No Criteria:  -Household, sexual or needle sharing contact with HBV -History of drug use or homelessness -HIV positive -Those with known Hep C  Hep C Hep C status: unknown or no prior testing Results in chart:  No results found for: HMHEPCSCREEN No components found for: HEPC  Do they qualify for HCV screening today? No Criteria - since the last HCV result, does the patient have any of the following? - Current drug use - Have a partner with drug use - Has been incarcerated  Immunization history:  There is no immunization history for the selected administration types on file for this patient.  The following portions of the patient's history were reviewed and updated as appropriate: allergies, current medications, past medical history, past social history, past surgical history and problem list.  Substance use screenings:  Uses tobacco products? Not asked Uses vapes?unknown Uses alcohol? unknown Uses non-injectable substances that alter your mental status? unknown Uses non-prescribed injectable substances?  Unknown   Objective:  There were no vitals filed for this visit.  Physical Exam Vitals and nursing note reviewed.  Constitutional:      Appearance: Normal appearance.  HENT:     Head: Normocephalic and atraumatic.     Comments: No nits or hair loss on scalp, brows, and lashes    Mouth/Throat:     Mouth: Mucous membranes are moist.     Pharynx: Oropharynx is clear. No oropharyngeal exudate or posterior oropharyngeal erythema.  Eyes:     General:        Right eye: No discharge.        Left eye: No discharge.     Conjunctiva/sclera: Conjunctivae normal.     Right eye: Right conjunctiva is not injected.     Left eye: Left conjunctiva is not injected.  Pulmonary:     Effort: Pulmonary effort is normal.  Abdominal:     Tenderness: There is no abdominal tenderness. There is no rebound.  Genitourinary:    Comments: Politely declined genital exam. Lymphadenopathy:     Cervical: No cervical adenopathy.     Upper Body:  Right upper body: No supraclavicular, axillary or epitrochlear adenopathy.     Left upper body: No supraclavicular, axillary or epitrochlear adenopathy.     Comments: Patient declines pelvic exam, inguinal lymph nodes not evaluated.  Skin:    General: Skin is warm and dry.     Findings: No lesion or rash.  Neurological:     Mental Status: She is alert and oriented to person, place, and time.  Psychiatric:        Mood and Affect: Mood normal.     Assessment and Plan:  Alyssa Mathis is a 39 y.o. female presenting to the [facility] Department for STI screening.  Patient accepted the following screenings: vaginal CT/GC swab, vaginal wet prep, HIV, and RPR  1. Screening for venereal disease (Primary)  - HIV [facility] - Syphilis Serology, [facility] - Chlamydia/Gonorrhea [facility] - WET PREP FOR TRICH, YEAST, CLUE  2. Family planning  - Pregnancy, urine  3. Schizoaffective disorder, bipolar type (HCC) Intermittently speaking in  non-sense, gibberish type language. Initially when pulled from waiting room, noted to have saluted the CNA stating, hello Hitler!..... just kidding asshole. On site law enforcement called- per his report, patient at baseline tends to speak in non-sense or gibberish type language and intermittently will speak English.  Patient opted to swab self while NP and CNA were in the room. Once picked up for lab- the patient had called [place] [employer] Department because she wanted a copy of a previous report made. Raigen became progressively more agitated while talking on the phone. [place] [employer] was called by on site officer and officers arrived at the HD. After discussion with patient- patient will be IVC'd and left with law enforcement.   Counseling: Discussed time line for [facility] results and that patient will be called with positive results and encouraged patient to call if they had not heard in 2 weeks.  Counseled to return or seek care for continued or worsening symptoms Recommended repeat testing in 3 months with positive results. Recommended condom use with all sex for STI prevention.   Return if symptoms worsen or fail to improve.  No future appointments.  Alyssa Mathis, [place] "

## 2024-09-29 NOTE — ED Notes (Signed)
 Pt sitting up on stretcher eating her dinner. Pt expressed feeling sick because she is missing her children while she is in the hospital; alert and talkative at this time with no acute distress noted.

## 2024-09-29 NOTE — ED Notes (Signed)
 Pt standing in hallway with pressured speech telling the tech that she forgot to come back and check on her and take her temperature. Pt reassured she was sleeping and we did not want to disturb her. Pt continues mumbling under her breath and ambulates to restroom.

## 2024-09-29 NOTE — ED Triage Notes (Signed)
 Pt brought to the ER by [place] police from [employer] [facility] where she went today for a pregnancy test, pt is talking with pressured speech. IVC paperwork in place, paperwork states stepfather's family are witches. Pt also rambling speech, diagnosis hx of schizoaffective disorder and bipolar

## 2024-09-29 NOTE — BH Assessment (Signed)
 This clinical research associate attempted to contact Wright,Channa pt's Mother, [phone] for collateral. A HIPAA compliant voicemail was left requesting a call back.

## 2024-09-29 NOTE — ED Notes (Signed)
 IVC PENDING  CONSULT ?

## 2024-09-29 NOTE — BH Assessment (Signed)
 This clinical research associate attempted to contact Alyssa Mathis,Alyssa Mathis pt's Mother, [phone] for collateral. Another  HIPAA compliant voicemail was left requesting a call back.
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
## 2024-09-29 NOTE — ED Notes (Signed)
 Alert, NAD, calm, interactive, jovial. EDP at [facility].

## 2024-09-30 MED ORDER — OLANZAPINE 10 MG PO TABS: 10.0000 mg | ORAL_TABLET | Freq: Every day | ORAL | Status: DC

## 2024-09-30 MED ORDER — HALOPERIDOL LACTATE 5 MG/ML IJ SOLN
5.0000 mg | Freq: Once | INTRAMUSCULAR | Status: AC
  Administered 2024-09-30: 5 mg via INTRAMUSCULAR
  Filled 2024-09-30: qty 1

## 2024-09-30 MED ORDER — ACETAMINOPHEN 325 MG PO TABS
ORAL_TABLET | ORAL | Status: AC
  Filled 2024-09-30: qty 2

## 2024-09-30 MED ORDER — METRONIDAZOLE 500 MG PO TABS
500.0000 mg | ORAL_TABLET | Freq: Two times a day (BID) | ORAL | Status: DC
  Administered 2024-10-01 – 2024-10-02 (×3): 500 mg via ORAL
  Filled 2024-09-30 (×3): qty 1

## 2024-09-30 MED ORDER — LORAZEPAM 2 MG/ML IJ SOLN
2.0000 mg | Freq: Once | INTRAMUSCULAR | Status: AC
  Administered 2024-09-30: 2 mg via INTRAMUSCULAR
  Filled 2024-09-30: qty 1

## 2024-09-30 MED ORDER — AMLODIPINE BESYLATE 5 MG PO TABS
5.0000 mg | ORAL_TABLET | Freq: Every day | ORAL | Status: DC
  Administered 2024-09-30 – 2024-10-02 (×3): 5 mg via ORAL
  Filled 2024-09-30 (×3): qty 1

## 2024-09-30 MED ORDER — ACETAMINOPHEN 325 MG PO TABS
650.0000 mg | ORAL_TABLET | Freq: Once | ORAL | Status: AC
  Administered 2024-09-30: 650 mg via ORAL
  Filled 2024-09-30: qty 2

## 2024-09-30 MED ORDER — DIPHENHYDRAMINE HCL 50 MG/ML IJ SOLN
50.0000 mg | Freq: Once | INTRAMUSCULAR | Status: AC
  Administered 2024-09-30: 50 mg via INTRAMUSCULAR
  Filled 2024-09-30: qty 1

## 2024-09-30 MED ORDER — OLANZAPINE 5 MG PO TABS
15.0000 mg | ORAL_TABLET | Freq: Every day | ORAL | Status: DC
  Administered 2024-10-01: 15 mg via ORAL
  Filled 2024-09-30: qty 1

## 2024-09-30 MED ORDER — ACETAMINOPHEN 325 MG PO TABS: 650.0000 mg | ORAL_TABLET | Freq: Four times a day (QID) | ORAL | Status: DC | PRN

## 2024-09-30 NOTE — ED Notes (Signed)
 Pt provided with breakfast tray.

## 2024-09-30 NOTE — ED Provider Notes (Signed)
 Emergency Medicine Observation Re-evaluation Note   BP 133/80 (BP Location: Right Arm)   Pulse (!) 109   Temp 99.1 F (37.3 C) (Oral)   Resp 20   Ht 5' 5 (1.651 m)   Wt 78 kg   LMP 09/12/2024 (Approximate)   SpO2 95%   BMI 28.62 kg/m    ED Course / MDM   No reported events during my shift at the time of this note.   Pt is awaiting dispo from consultants   Ginnie Shams MD    Shams Ginnie, MD 09/30/24 [phone]

## 2024-09-30 NOTE — ED Notes (Signed)
 Pt received lunch tray

## 2024-09-30 NOTE — ED Notes (Signed)
 Pt resting quietly with eyes closed and even respirations. Sitter at the bedside to ensure pt safety. Lights on for appropriate visibility of pt wellbeing.

## 2024-09-30 NOTE — Progress Notes (Signed)
 Carrie @ [address] called stating she spoke with their MD regarding placement.  In order to accept pt,  they are requesting that patient be restarted on home meds first and also will need EKG.  [facility] updated Heidy, CHARITY FUNDRAISER.  [place], [facility] [phone]

## 2024-09-30 NOTE — ED Notes (Signed)
 Pt given dinner tray and beverage

## 2024-09-30 NOTE — Progress Notes (Signed)
 Received call from [facility]) stating they would be calling RN for placement review.  [place], [facility] [phone]

## 2024-09-30 NOTE — ED Notes (Signed)
 IVC/  PENDING  PLACEMENT

## 2024-09-30 NOTE — Progress Notes (Signed)
 Per [facility] Selinda), patient has been referred to the following facilities:  Service Provider Phone  [facility]  [phone]  [facility]  [phone]  [facility]  [phone]  [facility]  [phone]  [facility]  [phone]  [facility]  [phone]  [facility]  [phone]  [facility]  [phone]  [facility]  [phone]  [facility]  [phone]  [place] [facility]  [phone]  [facility]  [phone]  [facility]  [phone]  [facility]  [address], [place] [phone]
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
## 2024-09-30 NOTE — ED Notes (Signed)
 Pt heard singing in her room loudly. Appears pleasant and cooperative at this time.

## 2024-09-30 NOTE — ED Notes (Signed)
 Pt given warm blanket.

## 2024-09-30 NOTE — ED Notes (Signed)
 IVC/Reassess overnight

## 2024-09-30 NOTE — BH Assessment (Signed)
 Collateral Contact: Writer spoke with patients mother, Alois Silvan [phone]), for collateral information. Mother expressed concern regarding the patients mental state, reporting erratic communication patterns such as calling and abruptly hanging up, then refusing further contact. She believes the patient was recently expelled from a homeless shelter and described the patient as mentally unwell. Mother noted a family history of psychosis involving the patients father and brother. She stated the patient denies mental health issues, is noncompliant with prescribed medications, and typically functions well when medicated. Mother emphasized the need for stabilization and reported that the patients oldest childs father passed away approximately one year ago, which may be contributing to current symptoms. She also confirmed that the patient has children.

## 2024-09-30 NOTE — Progress Notes (Addendum)
 Patient is pending acceptance at [facility] for 10/01/24.  EKG has been sent and confirmed received by [facility].  Alfonso, RN confirmed patient has been restarted on home meds.  [facility] is requesting copy of updated MAR.  Will confirm receipt first thing in morning, not able to reach [facility] intake staff to confirm an acceptance.   [place], [facility] [phone]

## 2024-09-30 NOTE — ED Notes (Signed)
Pt asleep, resting calmly in bed.

## 2024-10-01 MED ORDER — HALOPERIDOL 0.5 MG PO TABS
2.0000 mg | ORAL_TABLET | Freq: Once | ORAL | Status: AC
  Administered 2024-10-01: 2 mg via ORAL
  Filled 2024-10-01: qty 4

## 2024-10-01 MED ORDER — LORAZEPAM 2 MG PO TABS
2.0000 mg | ORAL_TABLET | Freq: Once | ORAL | Status: AC
  Administered 2024-10-01: 2 mg via ORAL
  Filled 2024-10-01: qty 1

## 2024-10-01 MED ORDER — HALOPERIDOL 5 MG PO TABS: 5.0000 mg | ORAL_TABLET | Freq: Once | ORAL | Status: DC

## 2024-10-01 NOTE — ED Provider Notes (Signed)
 Nursing notified me patient becoming agitated.  Evidently yesterday had to be placed in restraint chair.  At this time not felt to need immediate restraint but agitated elevated mood.  Will medicate with oral Haldol  and lorazepam .  Continue to monitor for level of agitation   Dicky Anes, MD 10/01/24 2143

## 2024-10-01 NOTE — ED Provider Notes (Signed)
 Emergency Medicine Observation Re-evaluation Note  Alyssa Mathis is a 39 y.o. female, seen on rounds today.  Pt initially presented to the ED for complaints of Manic Behavior  Currently, the patient is is no acute distress. Denies any concerns at this time.  Physical Exam  Blood pressure 112/80, pulse 79, temperature 97.7 F (36.5 C), temperature source Axillary, resp. rate 16, height 5' 5 (1.651 m), weight 78 kg, last menstrual period 09/12/2024, SpO2 97%, unknown if currently breastfeeding.  Physical Exam: General: No apparent distress Pulm: Normal WOB Neuro: Moving all extremities Psych: Resting comfortably     ED Course / MDM     I have reviewed the labs performed to date as well as medications administered while in observation.  Recent changes in the last 24 hours include: No acute events overnight.  Plan   Current plan: Patient awaiting psychiatric disposition.  The patient has been placed in psychiatric observation due to the need to provide a safe environment for the patient while obtaining psychiatric consultation and evaluation, as well as ongoing medical and medication management to treat the patient's condition.      Homer Miller, Alyssa SAILOR, DO 10/01/24 [phone]

## 2024-10-01 NOTE — ED Notes (Signed)
 EDP and Charge RN made aware that pt is disrupting the whole unit by her constant loud behavior. Pt is yelling and hitting the walls.

## 2024-10-01 NOTE — Progress Notes (Signed)
 Resent Med list to [facility]. Receipt confirmed by Elenor.   Alyssa Mathis is currently awaiting MD's approval for patient's acceptance.  [place], [facility] [phone]

## 2024-10-01 NOTE — ED Notes (Signed)
 This RN gave report to Eleni Gal, [facility]ER at [facility].

## 2024-10-01 NOTE — ED Notes (Signed)
 Pt given snack.

## 2024-10-01 NOTE — ED Notes (Signed)
 Hospital meal provided.  100% consumed, pt tolerated w/o complaints.  Waste discarded appropriately.

## 2024-10-01 NOTE — ED Notes (Signed)
 Meal provided

## 2024-10-01 NOTE — ED Notes (Signed)
Patient in day room. 

## 2024-10-01 NOTE — ED Notes (Signed)
 IVC/Pending Placement

## 2024-10-01 NOTE — ED Notes (Signed)
 Patient sleeping

## 2024-10-01 NOTE — ED Notes (Signed)
Pt requested shower; provided clean hospital clothing and linens.  Shower setup provided with soap, shampoo, toothbrush/toothpaste, and deodorant.  Pt able to preform own ADL's with no assistance.    

## 2024-10-01 NOTE — ED Notes (Signed)
 Pt banging on the door to the locked unit and hollering. Pt instructed that she can not be banging on the doors and windows.

## 2024-10-01 NOTE — ED Notes (Signed)
IVC  MOVED  TO  BHU  PENDING  PLACEMENT 

## 2024-10-01 NOTE — ED Notes (Signed)
 Patient walking and singing in the hallway

## 2024-10-01 NOTE — ED Notes (Signed)
 Transferred to [facility] via wheelchair by EDT and security. 1 bag of belongings sent to be locked in [place] locker room. Pt alert and oriented X4, cooperative, RR even and unlabored, color WNL. Pt in NAD.

## 2024-10-01 NOTE — ED Notes (Signed)
 Patient in dayroom watching tv

## 2024-10-01 NOTE — ED Notes (Signed)
 Pt ABCs intact. RR even and unlabored. Pt in NAD. PT is in the day room at this time.   Past Medical History:  Diagnosis Date   Anemia    Hypertension    Post partum depression    Pregnancy induced hypertension    Schizoaffective disorder (HCC)

## 2024-10-01 NOTE — Progress Notes (Signed)
 Patient has been accepted to [facility] for 10/02/24. Patient was assigned to [facility]. Accepting physician is Dr. Millie Manners. Call report to [phone] Option 2 (Please leave voicemail). Representative was [employer]   ER Staff is aware of it: [place], ER Secretary Dr. Viviann, ER MD Leonor PEAK, Patient's Nurse   Address:  [address]                  [place], [place] [address]

## 2024-10-01 NOTE — ED Notes (Addendum)
 Patient given snack. No acute needs at this time.

## 2024-10-01 NOTE — ED Notes (Signed)
 IVC  going  to  [facility]  on 10/03/23

## 2024-10-01 NOTE — ED Notes (Signed)
Pt given breakfast tray and beverage.  

## 2024-10-01 NOTE — ED Notes (Signed)
Patient in dayroom on the phone at this time.

## 2024-10-02 NOTE — ED Notes (Signed)
 Patient has received shower supplies, and is currently in the shower.

## 2024-10-02 NOTE — ED Notes (Signed)
 EMTALA reviewed by this RN.

## 2024-10-02 NOTE — ED Notes (Signed)
 IVC  going  to  [facility]  on 10/03/23

## 2024-10-02 NOTE — ED Notes (Signed)
 IVC/Going to [facility] this am

## 2024-10-02 NOTE — ED Provider Notes (Signed)
 Emergency Medicine Observation Re-evaluation Note  Alyssa Mathis is a 39 y.o. female, seen on rounds today.  Pt initially presented to the ED for complaints of Manic Behavior Currently, the patient is resting.  Physical Exam  BP (!) 114/106   Pulse 88   Temp 98.3 F (36.8 C) (Oral)   Resp 18   Ht 5' 5 (1.651 m)   Wt 78 kg   LMP 09/12/2024 (Approximate)   SpO2 100%   BMI 28.62 kg/m  Physical Exam .Gen:  No acute distress Resp:  Breathing easily and comfortably, no accessory muscle usage Neuro:  Moving all four extremities, no gross focal neuro deficits Psych:  Resting currently, calm when awake   ED Course / MDM    I have reviewed the labs performed to date as well as medications administered while in observation.  Recent changes in the last 24 hours include, agitation last night requiring oral haldol  and ativan .  Plan  Current plan is for psyc dispo.    [place]mond Lorelle Cummins, MD 10/02/24 [phone]

## 2024-10-02 NOTE — ED Notes (Signed)
 IVC called c com for sheriff's transport to [facility]

## 2024-10-13 ENCOUNTER — Encounter: Payer: Self-pay | Admitting: Family Medicine

## 2024-10-13 ENCOUNTER — Ambulatory Visit: Payer: Self-pay | Admitting: Family Medicine

## 2024-10-13 ENCOUNTER — Ambulatory Visit: Admitting: Family Medicine

## 2024-10-13 VITALS — BP 170/10

## 2024-10-13 DIAGNOSIS — Z3201 Encounter for pregnancy test, result positive: Secondary | ICD-10-CM | POA: Diagnosis not present

## 2024-10-13 DIAGNOSIS — I10 Essential (primary) hypertension: Secondary | ICD-10-CM

## 2024-10-13 DIAGNOSIS — Z3009 Encounter for other general counseling and advice on contraception: Secondary | ICD-10-CM | POA: Diagnosis not present

## 2024-10-13 DIAGNOSIS — F250 Schizoaffective disorder, bipolar type: Secondary | ICD-10-CM

## 2024-10-13 LAB — PREGNANCY, URINE: Preg Test, Ur: POSITIVE — AB

## 2024-10-13 MED ORDER — PRENATAL VITAMIN 27-0.8 MG PO TABS: 1.0000 | ORAL_TABLET | ORAL | Status: AC

## 2024-10-13 NOTE — Progress Notes (Addendum)
 1. Pregnancy test positive (Primary) -Pt reports she does not know when LMP was, when seen at this office 2 weeks ago, PT was negative. At that time, reported LMP as 09/12/24- will keep this as LMP for dating until ultrasound can be done  -reviewed importance of PNV- pt states the baby will grow regardless of how you eat or if you take a vitamin- discussed NTD, and encourage PNV intake, given today -pt asked this provider to call [facility] for appointment -Appt made for Feb 16- information given to patient   - Pregnancy, urine - Prenatal Vit-Fe Fumarate-FA (PRENATAL VITAMIN) 27-0.8 MG TABS; Take 1 tablet by mouth 1 day or 1 dose for 1 dose.  2. Schizoaffective disorder, bipolar type (HCC) -pt noted to be on the phone with office/provider for psychiatry- was stating she don't want to be putting stuff in my blood that could harm the baby- states I'm in my right mind, I know what I'm doing. They injected me twice with stuff- who knows what was in that -pt believes she was given an injection at last hospital stay and worries that this will harm her baby -clinic states they will contact her later today or tomorrow regarding med regimen   3. Hypertension, unspecified type -pt has been prescribed amlodipine  5mg  PO daily- denies taking this medication -reviewed with Delon Coe, CNM --- BP today 170/110 -reviewed risks of cHTN for pregnancy, pt states I know all that. Strongly encouraged to take medication for htn- given warning signs handout    [facility] FNP-C

## 2024-10-13 NOTE — Progress Notes (Signed)
 UPT: positive. Pt was dispensed prenatal vitamins 1 tablet/daily. Pt has been counseled about medication regimen. Resources given to pt by provider. Wilkie Drought, RN.

## 2024-10-15 NOTE — Addendum Note (Signed)
 Addended by: VEDA HUMMINGBIRD on: 10/15/2024 02:35 PM   Modules accepted: Level of Service
